# Patient Record
Sex: Female | Born: 1960 | Race: White | Hispanic: No | Marital: Single | Smoking: Current some day smoker
Health system: Southern US, Community
[De-identification: ages and names within clinical notes are randomized; demographics above are authoritative.]

## PROBLEM LIST (undated history)

## (undated) DIAGNOSIS — Z9889 Other specified postprocedural states: Secondary | ICD-10-CM

## (undated) DIAGNOSIS — I509 Heart failure, unspecified: Secondary | ICD-10-CM

## (undated) DIAGNOSIS — R609 Edema, unspecified: Secondary | ICD-10-CM

## (undated) DIAGNOSIS — L0292 Furuncle, unspecified: Secondary | ICD-10-CM

## (undated) DIAGNOSIS — J449 Chronic obstructive pulmonary disease, unspecified: Secondary | ICD-10-CM

## (undated) DIAGNOSIS — R0600 Dyspnea, unspecified: Secondary | ICD-10-CM

## (undated) DIAGNOSIS — G629 Polyneuropathy, unspecified: Secondary | ICD-10-CM

## (undated) DIAGNOSIS — D179 Benign lipomatous neoplasm, unspecified: Secondary | ICD-10-CM

## (undated) DIAGNOSIS — I639 Cerebral infarction, unspecified: Secondary | ICD-10-CM

## (undated) DIAGNOSIS — L0293 Carbuncle, unspecified: Secondary | ICD-10-CM

## (undated) DIAGNOSIS — K219 Gastro-esophageal reflux disease without esophagitis: Secondary | ICD-10-CM

## (undated) DIAGNOSIS — N390 Urinary tract infection, site not specified: Secondary | ICD-10-CM

## (undated) DIAGNOSIS — M5136 Other intervertebral disc degeneration, lumbar region: Secondary | ICD-10-CM

## (undated) DIAGNOSIS — F259 Schizoaffective disorder, unspecified: Secondary | ICD-10-CM

## (undated) DIAGNOSIS — N319 Neuromuscular dysfunction of bladder, unspecified: Secondary | ICD-10-CM

## (undated) DIAGNOSIS — Z9851 Tubal ligation status: Secondary | ICD-10-CM

## (undated) DIAGNOSIS — J45909 Unspecified asthma, uncomplicated: Secondary | ICD-10-CM

## (undated) DIAGNOSIS — G4730 Sleep apnea, unspecified: Secondary | ICD-10-CM

## (undated) DIAGNOSIS — Z9049 Acquired absence of other specified parts of digestive tract: Secondary | ICD-10-CM

## (undated) DIAGNOSIS — G5600 Carpal tunnel syndrome, unspecified upper limb: Secondary | ICD-10-CM

## (undated) DIAGNOSIS — K469 Unspecified abdominal hernia without obstruction or gangrene: Secondary | ICD-10-CM

## (undated) DIAGNOSIS — Z972 Presence of dental prosthetic device (complete) (partial): Secondary | ICD-10-CM

## (undated) DIAGNOSIS — N760 Acute vaginitis: Secondary | ICD-10-CM

## (undated) DIAGNOSIS — E669 Obesity, unspecified: Secondary | ICD-10-CM

## (undated) DIAGNOSIS — R252 Cramp and spasm: Secondary | ICD-10-CM

## (undated) DIAGNOSIS — M1990 Unspecified osteoarthritis, unspecified site: Secondary | ICD-10-CM

## (undated) DIAGNOSIS — I10 Essential (primary) hypertension: Secondary | ICD-10-CM

## (undated) DIAGNOSIS — E785 Hyperlipidemia, unspecified: Secondary | ICD-10-CM

## (undated) DIAGNOSIS — E119 Type 2 diabetes mellitus without complications: Secondary | ICD-10-CM

## (undated) DIAGNOSIS — M51369 Other intervertebral disc degeneration, lumbar region without mention of lumbar back pain or lower extremity pain: Secondary | ICD-10-CM

## (undated) HISTORY — DX: Other specified postprocedural states: Z98.890

## (undated) HISTORY — PX: EYE SURGERY: SHX253

## (undated) HISTORY — DX: Other intervertebral disc degeneration, lumbar region: M51.36

## (undated) HISTORY — DX: Chronic obstructive pulmonary disease, unspecified: J44.9

## (undated) HISTORY — DX: Polyneuropathy, unspecified: G62.9

## (undated) HISTORY — PX: CHOLECYSTECTOMY: SHX55

## (undated) HISTORY — DX: Unspecified osteoarthritis, unspecified site: M19.90

## (undated) HISTORY — DX: Carpal tunnel syndrome, unspecified upper limb: G56.00

## (undated) HISTORY — DX: Carbuncle, unspecified: L02.93

## (undated) HISTORY — DX: Obesity, unspecified: E66.9

## (undated) HISTORY — DX: Acute vaginitis: N76.0

## (undated) HISTORY — DX: Dyspnea, unspecified: R06.00

## (undated) HISTORY — DX: Cramp and spasm: R25.2

## (undated) HISTORY — PX: OTHER SURGICAL HISTORY: SHX169

## (undated) HISTORY — DX: Hyperlipidemia, unspecified: E78.5

## (undated) HISTORY — DX: Edema, unspecified: R60.9

## (undated) HISTORY — DX: Schizoaffective disorder, unspecified: F25.9

## (undated) HISTORY — DX: Urinary tract infection, site not specified: N39.0

## (undated) HISTORY — DX: Gastro-esophageal reflux disease without esophagitis: K21.9

## (undated) HISTORY — DX: Other intervertebral disc degeneration, lumbar region without mention of lumbar back pain or lower extremity pain: M51.369

## (undated) HISTORY — DX: Neuromuscular dysfunction of bladder, unspecified: N31.9

## (undated) HISTORY — DX: Furuncle, unspecified: L02.92

---

## 2004-03-04 ENCOUNTER — Emergency Department: Payer: Self-pay | Admitting: Emergency Medicine

## 2004-04-10 ENCOUNTER — Inpatient Hospital Stay: Payer: Self-pay | Admitting: Psychiatry

## 2004-04-30 ENCOUNTER — Encounter: Payer: Self-pay | Admitting: Family Medicine

## 2004-05-17 ENCOUNTER — Encounter: Payer: Self-pay | Admitting: Family Medicine

## 2004-08-12 ENCOUNTER — Other Ambulatory Visit: Payer: Self-pay

## 2004-08-12 ENCOUNTER — Observation Stay: Payer: Self-pay | Admitting: Internal Medicine

## 2005-04-06 ENCOUNTER — Other Ambulatory Visit: Payer: Self-pay

## 2005-04-07 ENCOUNTER — Observation Stay: Payer: Self-pay | Admitting: Internal Medicine

## 2005-05-13 ENCOUNTER — Ambulatory Visit: Payer: Self-pay | Admitting: Family Medicine

## 2005-07-28 ENCOUNTER — Emergency Department: Payer: Self-pay | Admitting: Emergency Medicine

## 2005-07-28 ENCOUNTER — Other Ambulatory Visit: Payer: Self-pay

## 2005-11-13 ENCOUNTER — Ambulatory Visit: Payer: Self-pay | Admitting: Gastroenterology

## 2006-03-23 ENCOUNTER — Emergency Department: Payer: Self-pay | Admitting: Emergency Medicine

## 2006-06-23 ENCOUNTER — Inpatient Hospital Stay: Payer: Self-pay | Admitting: Internal Medicine

## 2006-06-23 ENCOUNTER — Other Ambulatory Visit: Payer: Self-pay

## 2006-09-01 ENCOUNTER — Emergency Department: Payer: Self-pay | Admitting: Emergency Medicine

## 2006-09-01 ENCOUNTER — Other Ambulatory Visit: Payer: Self-pay

## 2006-09-03 ENCOUNTER — Ambulatory Visit: Payer: Self-pay | Admitting: Family Medicine

## 2006-09-16 ENCOUNTER — Encounter: Payer: Self-pay | Admitting: Family Medicine

## 2006-11-05 ENCOUNTER — Other Ambulatory Visit: Payer: Self-pay

## 2006-11-05 ENCOUNTER — Emergency Department: Payer: Self-pay | Admitting: Emergency Medicine

## 2006-11-07 ENCOUNTER — Other Ambulatory Visit: Payer: Self-pay

## 2006-11-07 ENCOUNTER — Inpatient Hospital Stay: Payer: Self-pay | Admitting: Internal Medicine

## 2006-11-14 ENCOUNTER — Emergency Department: Payer: Self-pay | Admitting: Emergency Medicine

## 2006-11-24 ENCOUNTER — Ambulatory Visit: Payer: Self-pay | Admitting: Family Medicine

## 2007-02-21 ENCOUNTER — Ambulatory Visit: Payer: Self-pay | Admitting: Family Medicine

## 2007-05-23 DIAGNOSIS — E785 Hyperlipidemia, unspecified: Secondary | ICD-10-CM | POA: Diagnosis present

## 2007-05-23 DIAGNOSIS — M1990 Unspecified osteoarthritis, unspecified site: Secondary | ICD-10-CM | POA: Insufficient documentation

## 2007-06-28 ENCOUNTER — Encounter: Payer: Self-pay | Admitting: Family Medicine

## 2007-07-18 ENCOUNTER — Encounter: Payer: Self-pay | Admitting: Family Medicine

## 2007-08-06 ENCOUNTER — Other Ambulatory Visit: Payer: Self-pay

## 2007-08-06 ENCOUNTER — Emergency Department: Payer: Self-pay | Admitting: Emergency Medicine

## 2007-09-13 ENCOUNTER — Ambulatory Visit: Payer: Self-pay | Admitting: Gastroenterology

## 2007-12-15 ENCOUNTER — Ambulatory Visit: Payer: Self-pay | Admitting: Family Medicine

## 2008-01-03 ENCOUNTER — Inpatient Hospital Stay: Payer: Self-pay | Admitting: Internal Medicine

## 2008-01-22 ENCOUNTER — Emergency Department: Payer: Self-pay | Admitting: Emergency Medicine

## 2008-03-27 ENCOUNTER — Emergency Department: Payer: Self-pay | Admitting: Internal Medicine

## 2008-03-30 ENCOUNTER — Emergency Department: Payer: Self-pay | Admitting: Emergency Medicine

## 2008-05-26 ENCOUNTER — Emergency Department: Payer: Self-pay | Admitting: Emergency Medicine

## 2008-06-22 ENCOUNTER — Ambulatory Visit: Payer: Self-pay | Admitting: Family Medicine

## 2008-07-17 ENCOUNTER — Ambulatory Visit: Payer: Self-pay | Admitting: Family Medicine

## 2008-08-18 ENCOUNTER — Emergency Department: Payer: Self-pay | Admitting: Emergency Medicine

## 2008-08-22 ENCOUNTER — Emergency Department: Payer: Self-pay | Admitting: Emergency Medicine

## 2008-10-16 ENCOUNTER — Emergency Department: Payer: Self-pay | Admitting: Emergency Medicine

## 2008-11-14 ENCOUNTER — Ambulatory Visit: Payer: Self-pay | Admitting: Family Medicine

## 2009-02-04 ENCOUNTER — Ambulatory Visit: Payer: Self-pay | Admitting: Internal Medicine

## 2009-03-20 DIAGNOSIS — G629 Polyneuropathy, unspecified: Secondary | ICD-10-CM

## 2009-04-16 ENCOUNTER — Inpatient Hospital Stay: Payer: Self-pay | Admitting: Specialist

## 2009-06-06 ENCOUNTER — Emergency Department: Payer: Self-pay | Admitting: Emergency Medicine

## 2009-08-01 ENCOUNTER — Emergency Department: Payer: Self-pay | Admitting: Emergency Medicine

## 2009-10-22 ENCOUNTER — Emergency Department: Payer: Self-pay | Admitting: Emergency Medicine

## 2009-11-01 ENCOUNTER — Ambulatory Visit: Payer: Self-pay

## 2009-11-05 ENCOUNTER — Encounter: Payer: Self-pay | Admitting: Family Medicine

## 2010-02-06 ENCOUNTER — Emergency Department: Payer: Self-pay | Admitting: Emergency Medicine

## 2010-03-21 ENCOUNTER — Emergency Department: Payer: Self-pay | Admitting: Emergency Medicine

## 2010-08-14 ENCOUNTER — Emergency Department: Payer: Self-pay | Admitting: Emergency Medicine

## 2011-06-18 ENCOUNTER — Ambulatory Visit: Payer: Self-pay | Admitting: Family Medicine

## 2011-08-11 ENCOUNTER — Emergency Department: Payer: Self-pay | Admitting: Emergency Medicine

## 2011-08-11 LAB — URINALYSIS, COMPLETE
Bacteria: NONE SEEN
Bilirubin,UR: NEGATIVE
Blood: NEGATIVE
Glucose,UR: NEGATIVE mg/dL (ref 0–75)
Ketone: NEGATIVE
Leukocyte Esterase: NEGATIVE
Nitrite: NEGATIVE
Ph: 6 (ref 4.5–8.0)
Protein: NEGATIVE
RBC,UR: NONE SEEN /HPF (ref 0–5)
Specific Gravity: 1.002 (ref 1.003–1.030)
Squamous Epithelial: 1
WBC UR: 1 /HPF (ref 0–5)

## 2012-08-22 ENCOUNTER — Ambulatory Visit: Payer: Self-pay | Admitting: Family Medicine

## 2012-09-16 LAB — DRUG SCREEN, URINE

## 2012-09-16 LAB — CBC
HCT: 35.9 % (ref 35.0–47.0)
HGB: 12.2 g/dL (ref 12.0–16.0)
MCH: 25.6 pg — ABNORMAL LOW (ref 26.0–34.0)
MCHC: 34 g/dL (ref 32.0–36.0)
MCV: 75 fL — ABNORMAL LOW (ref 80–100)
Platelet: 231 10*3/uL (ref 150–440)
RBC: 4.77 10*6/uL (ref 3.80–5.20)
RDW: 16.6 % — ABNORMAL HIGH (ref 11.5–14.5)
WBC: 9.3 10*3/uL (ref 3.6–11.0)

## 2012-09-16 LAB — ETHANOL
Ethanol %: <0.003 % (ref 0.000–0.080)
Ethanol: <3 mg/dL

## 2012-09-16 LAB — COMPREHENSIVE METABOLIC PANEL
Albumin: 3.3 g/dL — ABNORMAL LOW (ref 3.4–5.0)
Alkaline Phosphatase: 205 U/L — ABNORMAL HIGH (ref 50–136)
Anion Gap: 6 — ABNORMAL LOW (ref 7–16)
BUN: 12 mg/dL (ref 7–18)
Bilirubin,Total: 0.3 mg/dL (ref 0.2–1.0)
Calcium, Total: 9.8 mg/dL (ref 8.5–10.1)
Chloride: 100 mmol/L (ref 98–107)
Co2: 31 mmol/L (ref 21–32)
Creatinine: 0.72 mg/dL (ref 0.60–1.30)
EGFR (African American): >60
EGFR (Non-African Amer.): >60
Glucose: 169 mg/dL — ABNORMAL HIGH (ref 65–99)
Osmolality: 277 (ref 275–301)
Potassium: 3.7 mmol/L (ref 3.5–5.1)
SGOT(AST): 16 U/L (ref 15–37)
SGPT (ALT): 28 U/L (ref 12–78)
Sodium: 137 mmol/L (ref 136–145)
Total Protein: 7.2 g/dL (ref 6.4–8.2)

## 2012-09-16 LAB — URINALYSIS, COMPLETE
Bilirubin,UR: NEGATIVE
Glucose,UR: NEGATIVE mg/dL (ref 0–75)
Ketone: NEGATIVE
Nitrite: POSITIVE
Ph: 6 (ref 4.5–8.0)
Protein: NEGATIVE
RBC,UR: 1 /HPF (ref 0–5)
Specific Gravity: 1.002 (ref 1.003–1.030)
Squamous Epithelial: <1
WBC UR: 25 /HPF (ref 0–5)

## 2012-09-16 LAB — TSH: Thyroid Stimulating Horm: 7.36 u[IU]/mL — ABNORMAL HIGH

## 2012-09-17 ENCOUNTER — Inpatient Hospital Stay: Payer: Self-pay | Admitting: Internal Medicine

## 2012-09-17 LAB — T4, FREE: Free Thyroxine: 0.94 ng/dL (ref 0.76–1.46)

## 2012-09-18 LAB — TROPONIN I: Troponin-I: <0.02 ng/mL

## 2012-09-18 LAB — GENTAMICIN LEVEL, PEAK: Gentamicin, Peak: 2.4 ug/mL — ABNORMAL LOW (ref 4.0–8.0)

## 2012-09-18 LAB — URINE CULTURE

## 2012-09-18 LAB — GENTAMICIN LEVEL, TROUGH: Gentamicin, Trough: <0.2 ug/mL — ABNORMAL LOW (ref 0.0–2.0)

## 2012-09-19 LAB — BASIC METABOLIC PANEL
Anion Gap: 4 — ABNORMAL LOW (ref 7–16)
BUN: 16 mg/dL (ref 7–18)
Calcium, Total: 9.4 mg/dL (ref 8.5–10.1)
Chloride: 100 mmol/L (ref 98–107)
Co2: 33 mmol/L — ABNORMAL HIGH (ref 21–32)
Creatinine: 0.88 mg/dL (ref 0.60–1.30)
EGFR (African American): >60
EGFR (Non-African Amer.): >60
Glucose: 167 mg/dL — ABNORMAL HIGH (ref 65–99)
Osmolality: 279 (ref 275–301)
Potassium: 4.3 mmol/L (ref 3.5–5.1)
Sodium: 137 mmol/L (ref 136–145)

## 2012-09-19 LAB — CBC WITH DIFFERENTIAL/PLATELET
Basophil #: 0.1 10*3/uL (ref 0.0–0.1)
Basophil %: 1.3 %
Eosinophil #: 0.3 10*3/uL (ref 0.0–0.7)
Eosinophil %: 2.5 %
HCT: 38.2 % (ref 35.0–47.0)
HGB: 12.7 g/dL (ref 12.0–16.0)
Lymphocyte #: 2.4 10*3/uL (ref 1.0–3.6)
Lymphocyte %: 22.7 %
MCH: 25.4 pg — ABNORMAL LOW (ref 26.0–34.0)
MCHC: 33.3 g/dL (ref 32.0–36.0)
MCV: 76 fL — ABNORMAL LOW (ref 80–100)
Monocyte #: 0.8 x10 3/mm (ref 0.2–0.9)
Monocyte %: 7.5 %
Neutrophil #: 6.9 10*3/uL — ABNORMAL HIGH (ref 1.4–6.5)
Neutrophil %: 66 %
Platelet: 270 10*3/uL (ref 150–440)
RBC: 5.01 10*6/uL (ref 3.80–5.20)
RDW: 16.6 % — ABNORMAL HIGH (ref 11.5–14.5)
WBC: 10.5 10*3/uL (ref 3.6–11.0)

## 2012-10-05 ENCOUNTER — Emergency Department: Payer: Self-pay | Admitting: Emergency Medicine

## 2012-10-05 LAB — URINALYSIS, COMPLETE
Bacteria: NONE SEEN
Bilirubin,UR: NEGATIVE
Glucose,UR: NEGATIVE mg/dL (ref 0–75)
Ketone: NEGATIVE
Leukocyte Esterase: NEGATIVE
Nitrite: NEGATIVE
Ph: 5 (ref 4.5–8.0)
Protein: NEGATIVE
RBC,UR: 1 /HPF (ref 0–5)
Specific Gravity: 1.005 (ref 1.003–1.030)
Squamous Epithelial: 2
WBC UR: 1 /HPF (ref 0–5)

## 2012-10-05 LAB — COMPREHENSIVE METABOLIC PANEL
Albumin: 3.3 g/dL — ABNORMAL LOW (ref 3.4–5.0)
Alkaline Phosphatase: 209 U/L — ABNORMAL HIGH (ref 50–136)
Anion Gap: 6 — ABNORMAL LOW (ref 7–16)
BUN: 16 mg/dL (ref 7–18)
Bilirubin,Total: 0.3 mg/dL (ref 0.2–1.0)
Calcium, Total: 9.2 mg/dL (ref 8.5–10.1)
Chloride: 99 mmol/L (ref 98–107)
Co2: 29 mmol/L (ref 21–32)
Creatinine: 0.86 mg/dL (ref 0.60–1.30)
EGFR (African American): >60
EGFR (Non-African Amer.): >60
Glucose: 211 mg/dL — ABNORMAL HIGH (ref 65–99)
Osmolality: 276 (ref 275–301)
Potassium: 3.8 mmol/L (ref 3.5–5.1)
SGOT(AST): 23 U/L (ref 15–37)
SGPT (ALT): 33 U/L (ref 12–78)
Sodium: 134 mmol/L — ABNORMAL LOW (ref 136–145)
Total Protein: 7.1 g/dL (ref 6.4–8.2)

## 2012-10-05 LAB — CBC
HCT: 38.2 % (ref 35.0–47.0)
HGB: 12.6 g/dL (ref 12.0–16.0)
MCH: 25.4 pg — ABNORMAL LOW (ref 26.0–34.0)
MCHC: 32.9 g/dL (ref 32.0–36.0)
MCV: 77 fL — ABNORMAL LOW (ref 80–100)
Platelet: 238 10*3/uL (ref 150–440)
RBC: 4.95 10*6/uL (ref 3.80–5.20)
RDW: 16.4 % — ABNORMAL HIGH (ref 11.5–14.5)
WBC: 14.8 10*3/uL — ABNORMAL HIGH (ref 3.6–11.0)

## 2012-10-05 LAB — LIPASE, BLOOD: Lipase: 127 U/L (ref 73–393)

## 2012-10-05 LAB — TROPONIN I: Troponin-I: <0.02 ng/mL

## 2012-10-06 LAB — URINE CULTURE

## 2013-02-23 ENCOUNTER — Emergency Department: Payer: Self-pay | Admitting: Emergency Medicine

## 2013-02-23 LAB — CBC WITH DIFFERENTIAL/PLATELET
Basophil #: 0.1 10*3/uL (ref 0.0–0.1)
Basophil %: 0.9 %
Eosinophil #: 0.1 10*3/uL (ref 0.0–0.7)
Eosinophil %: 0.8 %
HCT: 38.6 % (ref 35.0–47.0)
HGB: 12.7 g/dL (ref 12.0–16.0)
Lymphocyte #: 1.3 10*3/uL (ref 1.0–3.6)
Lymphocyte %: 9.8 %
MCH: 25 pg — ABNORMAL LOW (ref 26.0–34.0)
MCHC: 33 g/dL (ref 32.0–36.0)
MCV: 76 fL — ABNORMAL LOW (ref 80–100)
Monocyte #: 0.7 x10 3/mm (ref 0.2–0.9)
Monocyte %: 5 %
Neutrophil #: 11.3 10*3/uL — ABNORMAL HIGH (ref 1.4–6.5)
Neutrophil %: 83.5 %
Platelet: 216 10*3/uL (ref 150–440)
RBC: 5.09 10*6/uL (ref 3.80–5.20)
RDW: 17 % — ABNORMAL HIGH (ref 11.5–14.5)
WBC: 13.5 10*3/uL — ABNORMAL HIGH (ref 3.6–11.0)

## 2013-02-23 LAB — COMPREHENSIVE METABOLIC PANEL
Albumin: 3.8 g/dL (ref 3.4–5.0)
Alkaline Phosphatase: 179 U/L — ABNORMAL HIGH
Anion Gap: 2 — ABNORMAL LOW (ref 7–16)
BUN: 13 mg/dL (ref 7–18)
Bilirubin,Total: 0.4 mg/dL (ref 0.2–1.0)
Calcium, Total: 9.9 mg/dL (ref 8.5–10.1)
Chloride: 97 mmol/L — ABNORMAL LOW (ref 98–107)
Co2: 35 mmol/L — ABNORMAL HIGH (ref 21–32)
Creatinine: 0.82 mg/dL (ref 0.60–1.30)
EGFR (African American): >60
EGFR (Non-African Amer.): >60
Glucose: 249 mg/dL — ABNORMAL HIGH (ref 65–99)
Osmolality: 277 (ref 275–301)
Potassium: 3.5 mmol/L (ref 3.5–5.1)
SGOT(AST): 22 U/L (ref 15–37)
SGPT (ALT): 33 U/L (ref 12–78)
Sodium: 134 mmol/L — ABNORMAL LOW (ref 136–145)
Total Protein: 7.5 g/dL (ref 6.4–8.2)

## 2013-02-23 LAB — URINALYSIS, COMPLETE
Bacteria: NONE SEEN
Bilirubin,UR: NEGATIVE
Blood: NEGATIVE
Glucose,UR: NEGATIVE mg/dL (ref 0–75)
Ketone: NEGATIVE
Leukocyte Esterase: NEGATIVE
Nitrite: NEGATIVE
Ph: 6 (ref 4.5–8.0)
Protein: NEGATIVE
RBC,UR: NONE SEEN /HPF (ref 0–5)
Specific Gravity: 1.003 (ref 1.003–1.030)
Squamous Epithelial: NONE SEEN
WBC UR: NONE SEEN /HPF (ref 0–5)

## 2013-02-23 LAB — TROPONIN I: Troponin-I: <0.02 ng/mL

## 2013-03-02 ENCOUNTER — Ambulatory Visit: Payer: Self-pay | Admitting: Family Medicine

## 2013-08-08 LAB — CBC WITH DIFFERENTIAL/PLATELET
Basophil #: 0.1 10*3/uL (ref 0.0–0.1)
Basophil %: 0.6 %
Eosinophil #: 0.1 10*3/uL (ref 0.0–0.7)
Eosinophil %: 1.4 %
HCT: 38.5 % (ref 35.0–47.0)
HGB: 12.1 g/dL (ref 12.0–16.0)
Lymphocyte #: 1.5 10*3/uL (ref 1.0–3.6)
Lymphocyte %: 15 %
MCH: 24.6 pg — ABNORMAL LOW (ref 26.0–34.0)
MCHC: 31.4 g/dL — ABNORMAL LOW (ref 32.0–36.0)
MCV: 78 fL — ABNORMAL LOW (ref 80–100)
Monocyte #: 0.8 x10 3/mm (ref 0.2–0.9)
Monocyte %: 8.1 %
Neutrophil #: 7.6 10*3/uL — ABNORMAL HIGH (ref 1.4–6.5)
Neutrophil %: 74.9 %
Platelet: 253 10*3/uL (ref 150–440)
RBC: 4.92 10*6/uL (ref 3.80–5.20)
RDW: 17.3 % — ABNORMAL HIGH (ref 11.5–14.5)
WBC: 10.1 10*3/uL (ref 3.6–11.0)

## 2013-08-08 LAB — URINALYSIS, COMPLETE
Bilirubin,UR: NEGATIVE
Blood: NEGATIVE
Glucose,UR: NEGATIVE mg/dL (ref 0–75)
Ketone: NEGATIVE
Leukocyte Esterase: NEGATIVE
Nitrite: POSITIVE
Ph: 6 (ref 4.5–8.0)
Protein: NEGATIVE
RBC,UR: <1 /HPF (ref 0–5)
Specific Gravity: 1.003 (ref 1.003–1.030)
Squamous Epithelial: 1
WBC UR: 1 /HPF (ref 0–5)

## 2013-08-08 LAB — COMPREHENSIVE METABOLIC PANEL
Albumin: 3.6 g/dL (ref 3.4–5.0)
Alkaline Phosphatase: 168 U/L — ABNORMAL HIGH
Anion Gap: 6 — ABNORMAL LOW (ref 7–16)
BUN: 13 mg/dL (ref 7–18)
Bilirubin,Total: 0.4 mg/dL (ref 0.2–1.0)
Calcium, Total: 8.9 mg/dL (ref 8.5–10.1)
Chloride: 97 mmol/L — ABNORMAL LOW (ref 98–107)
Co2: 32 mmol/L (ref 21–32)
Creatinine: 0.56 mg/dL — ABNORMAL LOW (ref 0.60–1.30)
EGFR (African American): >60
EGFR (Non-African Amer.): >60
Glucose: 204 mg/dL — ABNORMAL HIGH (ref 65–99)
Osmolality: 276 (ref 275–301)
Potassium: 3.9 mmol/L (ref 3.5–5.1)
SGOT(AST): 21 U/L (ref 15–37)
SGPT (ALT): 41 U/L (ref 12–78)
Sodium: 135 mmol/L — ABNORMAL LOW (ref 136–145)
Total Protein: 7 g/dL (ref 6.4–8.2)

## 2013-08-09 ENCOUNTER — Inpatient Hospital Stay: Payer: Self-pay | Admitting: Internal Medicine

## 2013-08-09 LAB — CBC WITH DIFFERENTIAL/PLATELET
Basophil #: 0.1 10*3/uL (ref 0.0–0.1)
Basophil %: 1.1 %
Eosinophil #: 0.1 10*3/uL (ref 0.0–0.7)
Eosinophil %: 1.9 %
HCT: 35.6 % (ref 35.0–47.0)
HGB: 11.6 g/dL — ABNORMAL LOW (ref 12.0–16.0)
Lymphocyte #: 1.3 10*3/uL (ref 1.0–3.6)
Lymphocyte %: 17.6 %
MCH: 25.6 pg — ABNORMAL LOW (ref 26.0–34.0)
MCHC: 32.5 g/dL (ref 32.0–36.0)
MCV: 79 fL — ABNORMAL LOW (ref 80–100)
Monocyte #: 0.7 x10 3/mm (ref 0.2–0.9)
Monocyte %: 9.2 %
Neutrophil #: 5.1 10*3/uL (ref 1.4–6.5)
Neutrophil %: 70.2 %
Platelet: 234 10*3/uL (ref 150–440)
RBC: 4.53 10*6/uL (ref 3.80–5.20)
RDW: 17.3 % — ABNORMAL HIGH (ref 11.5–14.5)
WBC: 7.3 10*3/uL (ref 3.6–11.0)

## 2013-08-09 LAB — BASIC METABOLIC PANEL
Anion Gap: 8 (ref 7–16)
BUN: 10 mg/dL (ref 7–18)
Calcium, Total: 9 mg/dL (ref 8.5–10.1)
Chloride: 101 mmol/L (ref 98–107)
Co2: 29 mmol/L (ref 21–32)
Creatinine: 0.54 mg/dL — ABNORMAL LOW (ref 0.60–1.30)
EGFR (African American): >60
EGFR (Non-African Amer.): >60
Glucose: 193 mg/dL — ABNORMAL HIGH (ref 65–99)
Osmolality: 280 (ref 275–301)
Potassium: 4.1 mmol/L (ref 3.5–5.1)
Sodium: 138 mmol/L (ref 136–145)

## 2013-08-10 LAB — URINE CULTURE

## 2013-10-15 ENCOUNTER — Emergency Department: Payer: Self-pay | Admitting: Emergency Medicine

## 2013-10-15 LAB — URINALYSIS, COMPLETE
Bacteria: NONE SEEN
Bilirubin,UR: NEGATIVE
Blood: NEGATIVE
Glucose,UR: NEGATIVE mg/dL (ref 0–75)
Ketone: NEGATIVE
Leukocyte Esterase: NEGATIVE
Nitrite: NEGATIVE
Ph: 5 (ref 4.5–8.0)
Protein: NEGATIVE
RBC,UR: NONE SEEN /HPF (ref 0–5)
Specific Gravity: 1.003 (ref 1.003–1.030)
Squamous Epithelial: 2
WBC UR: <1 /HPF (ref 0–5)

## 2013-10-15 LAB — CBC WITH DIFFERENTIAL/PLATELET
Basophil #: 0.1 10*3/uL (ref 0.0–0.1)
Basophil %: 0.8 %
Eosinophil #: 0.2 10*3/uL (ref 0.0–0.7)
Eosinophil %: 2.3 %
HCT: 38 % (ref 35.0–47.0)
HGB: 12.2 g/dL (ref 12.0–16.0)
Lymphocyte #: 1 10*3/uL (ref 1.0–3.6)
Lymphocyte %: 13.4 %
MCH: 26 pg (ref 26.0–34.0)
MCHC: 32.2 g/dL (ref 32.0–36.0)
MCV: 81 fL (ref 80–100)
Monocyte #: 0.6 x10 3/mm (ref 0.2–0.9)
Monocyte %: 8.1 %
Neutrophil #: 5.6 10*3/uL (ref 1.4–6.5)
Neutrophil %: 75.4 %
Platelet: 200 10*3/uL (ref 150–440)
RBC: 4.7 10*6/uL (ref 3.80–5.20)
RDW: 16.9 % — ABNORMAL HIGH (ref 11.5–14.5)
WBC: 7.4 10*3/uL (ref 3.6–11.0)

## 2013-10-15 LAB — BASIC METABOLIC PANEL
Anion Gap: 10 (ref 7–16)
BUN: 11 mg/dL (ref 7–18)
Calcium, Total: 9.6 mg/dL (ref 8.5–10.1)
Chloride: 100 mmol/L (ref 98–107)
Co2: 26 mmol/L (ref 21–32)
Creatinine: 0.97 mg/dL (ref 0.60–1.30)
EGFR (African American): >60
EGFR (Non-African Amer.): >60
Glucose: 251 mg/dL — ABNORMAL HIGH (ref 65–99)
Osmolality: 280 (ref 275–301)
Potassium: 4.4 mmol/L (ref 3.5–5.1)
Sodium: 136 mmol/L (ref 136–145)

## 2013-10-15 LAB — PRO B NATRIURETIC PEPTIDE: B-Type Natriuretic Peptide: 61 pg/mL (ref 0–125)

## 2013-10-15 LAB — TROPONIN I: Troponin-I: <0.02 ng/mL

## 2013-12-18 ENCOUNTER — Ambulatory Visit: Payer: Self-pay | Admitting: Family Medicine

## 2014-01-17 ENCOUNTER — Inpatient Hospital Stay: Payer: Self-pay | Admitting: Internal Medicine

## 2014-01-17 LAB — CK: CK, Total: 153 U/L (ref 26–192)

## 2014-01-17 LAB — CBC
HCT: 39.5 % (ref 35.0–47.0)
HGB: 12.8 g/dL (ref 12.0–16.0)
MCH: 26.2 pg (ref 26.0–34.0)
MCHC: 32.5 g/dL (ref 32.0–36.0)
MCV: 81 fL (ref 80–100)
Platelet: 229 10*3/uL (ref 150–440)
RBC: 4.89 10*6/uL (ref 3.80–5.20)
RDW: 16.2 % — ABNORMAL HIGH (ref 11.5–14.5)
WBC: 10.9 10*3/uL (ref 3.6–11.0)

## 2014-01-17 LAB — BASIC METABOLIC PANEL
Anion Gap: 12 (ref 7–16)
BUN: 19 mg/dL — ABNORMAL HIGH (ref 7–18)
Calcium, Total: 8.8 mg/dL (ref 8.5–10.1)
Chloride: 80 mmol/L — ABNORMAL LOW (ref 98–107)
Co2: 31 mmol/L (ref 21–32)
Creatinine: 1.08 mg/dL (ref 0.60–1.30)
EGFR (African American): >60
EGFR (Non-African Amer.): 56 — ABNORMAL LOW
Glucose: 161 mg/dL — ABNORMAL HIGH (ref 65–99)
Osmolality: 254 (ref 275–301)
Potassium: 3.3 mmol/L — ABNORMAL LOW (ref 3.5–5.1)
Sodium: 123 mmol/L — ABNORMAL LOW (ref 136–145)

## 2014-01-17 LAB — URINALYSIS, COMPLETE
Bilirubin,UR: NEGATIVE
Blood: NEGATIVE
Glucose,UR: NEGATIVE mg/dL (ref 0–75)
Ketone: NEGATIVE
Leukocyte Esterase: NEGATIVE
Nitrite: NEGATIVE
Ph: 6 (ref 4.5–8.0)
Protein: NEGATIVE
RBC,UR: NONE SEEN /HPF (ref 0–5)
Specific Gravity: 1.004 (ref 1.003–1.030)
Squamous Epithelial: 1
WBC UR: 2 /HPF (ref 0–5)

## 2014-01-17 LAB — OSMOLALITY: Osmolality: 263 mOsm/kg — ABNORMAL LOW (ref 275–295)

## 2014-01-17 LAB — OSMOLALITY, URINE: Osmolality: 184 mOsm/kg

## 2014-01-17 LAB — TROPONIN I: Troponin-I: <0.02 ng/mL

## 2014-01-18 LAB — CBC WITH DIFFERENTIAL/PLATELET
Basophil #: 0.1 10*3/uL (ref 0.0–0.1)
Basophil %: 0.6 %
Eosinophil #: 0.2 10*3/uL (ref 0.0–0.7)
Eosinophil %: 1.9 %
HCT: 39.1 % (ref 35.0–47.0)
HGB: 12.9 g/dL (ref 12.0–16.0)
Lymphocyte #: 1 10*3/uL (ref 1.0–3.6)
Lymphocyte %: 10.3 %
MCH: 26.7 pg (ref 26.0–34.0)
MCHC: 32.9 g/dL (ref 32.0–36.0)
MCV: 81 fL (ref 80–100)
Monocyte #: 0.8 x10 3/mm (ref 0.2–0.9)
Monocyte %: 8.2 %
Neutrophil #: 7.7 10*3/uL — ABNORMAL HIGH (ref 1.4–6.5)
Neutrophil %: 79 %
Platelet: 237 10*3/uL (ref 150–440)
RBC: 4.81 10*6/uL (ref 3.80–5.20)
RDW: 16.1 % — ABNORMAL HIGH (ref 11.5–14.5)
WBC: 9.7 10*3/uL (ref 3.6–11.0)

## 2014-01-18 LAB — BASIC METABOLIC PANEL
Anion Gap: 10 (ref 7–16)
BUN: 20 mg/dL — ABNORMAL HIGH (ref 7–18)
Calcium, Total: 8.9 mg/dL (ref 8.5–10.1)
Chloride: 84 mmol/L — ABNORMAL LOW (ref 98–107)
Co2: 34 mmol/L — ABNORMAL HIGH (ref 21–32)
Creatinine: 0.82 mg/dL (ref 0.60–1.30)
EGFR (African American): >60
EGFR (Non-African Amer.): >60
Glucose: 203 mg/dL — ABNORMAL HIGH (ref 65–99)
Osmolality: 266 (ref 275–301)
Potassium: 3.2 mmol/L — ABNORMAL LOW (ref 3.5–5.1)
Sodium: 128 mmol/L — ABNORMAL LOW (ref 136–145)

## 2014-01-18 LAB — HEMOGLOBIN A1C: Hemoglobin A1C: 8.7 % — ABNORMAL HIGH (ref 4.2–6.3)

## 2014-01-18 LAB — MAGNESIUM: Magnesium: 2 mg/dL

## 2014-01-19 LAB — COMPREHENSIVE METABOLIC PANEL
Albumin: 3 g/dL — ABNORMAL LOW (ref 3.4–5.0)
Alkaline Phosphatase: 154 U/L — ABNORMAL HIGH
Anion Gap: 8 (ref 7–16)
BUN: 15 mg/dL (ref 7–18)
Bilirubin,Total: 0.3 mg/dL (ref 0.2–1.0)
Calcium, Total: 8.8 mg/dL (ref 8.5–10.1)
Chloride: 99 mmol/L (ref 98–107)
Co2: 30 mmol/L (ref 21–32)
Creatinine: 0.69 mg/dL (ref 0.60–1.30)
EGFR (African American): >60
EGFR (Non-African Amer.): >60
Glucose: 203 mg/dL — ABNORMAL HIGH (ref 65–99)
Osmolality: 280 (ref 275–301)
Potassium: 4.1 mmol/L (ref 3.5–5.1)
SGOT(AST): 28 U/L (ref 15–37)
SGPT (ALT): 35 U/L
Sodium: 137 mmol/L (ref 136–145)
Total Protein: 6.6 g/dL (ref 6.4–8.2)

## 2014-02-16 LAB — URINALYSIS, COMPLETE
Bilirubin,UR: NEGATIVE
Glucose,UR: NEGATIVE mg/dL (ref 0–75)
Nitrite: POSITIVE
Ph: 6 (ref 4.5–8.0)
Protein: 100
RBC,UR: 17 /HPF (ref 0–5)
Specific Gravity: 1.012 (ref 1.003–1.030)
Squamous Epithelial: 1
WBC UR: 1115 /HPF (ref 0–5)

## 2014-02-16 LAB — COMPREHENSIVE METABOLIC PANEL
Albumin: 2.8 g/dL — ABNORMAL LOW (ref 3.4–5.0)
Alkaline Phosphatase: 178 U/L — ABNORMAL HIGH
Anion Gap: 11 (ref 7–16)
BUN: 10 mg/dL (ref 7–18)
Bilirubin,Total: 0.6 mg/dL (ref 0.2–1.0)
Calcium, Total: 8.5 mg/dL (ref 8.5–10.1)
Chloride: 98 mmol/L (ref 98–107)
Co2: 28 mmol/L (ref 21–32)
Creatinine: 0.88 mg/dL (ref 0.60–1.30)
EGFR (African American): >60
EGFR (Non-African Amer.): >60
Glucose: 235 mg/dL — ABNORMAL HIGH (ref 65–99)
Osmolality: 280 (ref 275–301)
Potassium: 3.4 mmol/L — ABNORMAL LOW (ref 3.5–5.1)
SGOT(AST): 20 U/L (ref 15–37)
SGPT (ALT): 30 U/L
Sodium: 137 mmol/L (ref 136–145)
Total Protein: 6.9 g/dL (ref 6.4–8.2)

## 2014-02-16 LAB — CBC WITH DIFFERENTIAL/PLATELET
Basophil #: 0.3 10*3/uL — ABNORMAL HIGH (ref 0.0–0.1)
Basophil %: 3.6 %
Eosinophil #: 0 10*3/uL (ref 0.0–0.7)
Eosinophil %: 0.1 %
HCT: 34.8 % — ABNORMAL LOW (ref 35.0–47.0)
HGB: 11.7 g/dL — ABNORMAL LOW (ref 12.0–16.0)
Lymphocyte #: 0.6 10*3/uL — ABNORMAL LOW (ref 1.0–3.6)
Lymphocyte %: 6.6 %
MCH: 27.5 pg (ref 26.0–34.0)
MCHC: 33.7 g/dL (ref 32.0–36.0)
MCV: 81 fL (ref 80–100)
Monocyte #: 0.8 x10 3/mm (ref 0.2–0.9)
Monocyte %: 9.9 %
Neutrophil #: 6.7 10*3/uL — ABNORMAL HIGH (ref 1.4–6.5)
Neutrophil %: 79.8 %
Platelet: 196 10*3/uL (ref 150–440)
RBC: 4.27 10*6/uL (ref 3.80–5.20)
RDW: 15.8 % — ABNORMAL HIGH (ref 11.5–14.5)
WBC: 8.4 10*3/uL (ref 3.6–11.0)

## 2014-02-16 LAB — PROTIME-INR
INR: 1.2
Prothrombin Time: 14.7 secs (ref 11.5–14.7)

## 2014-02-16 LAB — MAGNESIUM: Magnesium: 1.9 mg/dL

## 2014-02-16 LAB — TROPONIN I: Troponin-I: <0.02 ng/mL

## 2014-02-17 ENCOUNTER — Inpatient Hospital Stay: Payer: Self-pay | Admitting: Internal Medicine

## 2014-02-17 LAB — CBC WITH DIFFERENTIAL/PLATELET
Basophil #: 0 10*3/uL (ref 0.0–0.1)
Basophil %: 0.6 %
Eosinophil #: 0 10*3/uL (ref 0.0–0.7)
Eosinophil %: 0.2 %
HCT: 33 % — ABNORMAL LOW (ref 35.0–47.0)
HGB: 10.7 g/dL — ABNORMAL LOW (ref 12.0–16.0)
Lymphocyte #: 0.9 10*3/uL — ABNORMAL LOW (ref 1.0–3.6)
Lymphocyte %: 12 %
MCH: 26.9 pg (ref 26.0–34.0)
MCHC: 32.5 g/dL (ref 32.0–36.0)
MCV: 83 fL (ref 80–100)
Monocyte #: 0.7 x10 3/mm (ref 0.2–0.9)
Monocyte %: 10.2 %
Neutrophil #: 5.5 10*3/uL (ref 1.4–6.5)
Neutrophil %: 77 %
Platelet: 164 10*3/uL (ref 150–440)
RBC: 3.99 10*6/uL (ref 3.80–5.20)
RDW: 16 % — ABNORMAL HIGH (ref 11.5–14.5)
WBC: 7.1 10*3/uL (ref 3.6–11.0)

## 2014-02-17 LAB — BASIC METABOLIC PANEL
Anion Gap: 6 — ABNORMAL LOW (ref 7–16)
BUN: 9 mg/dL (ref 7–18)
Calcium, Total: 7.8 mg/dL — ABNORMAL LOW (ref 8.5–10.1)
Chloride: 104 mmol/L (ref 98–107)
Co2: 27 mmol/L (ref 21–32)
Creatinine: 0.85 mg/dL (ref 0.60–1.30)
EGFR (African American): >60
EGFR (Non-African Amer.): >60
Glucose: 237 mg/dL — ABNORMAL HIGH (ref 65–99)
Osmolality: 280 (ref 275–301)
Potassium: 3.7 mmol/L (ref 3.5–5.1)
Sodium: 137 mmol/L (ref 136–145)

## 2014-02-17 LAB — MAGNESIUM: Magnesium: 1.9 mg/dL

## 2014-02-18 LAB — CBC WITH DIFFERENTIAL/PLATELET
Basophil #: 0 10*3/uL (ref 0.0–0.1)
Basophil %: 0.5 %
Eosinophil #: 0 10*3/uL (ref 0.0–0.7)
Eosinophil %: 0.4 %
HCT: 32.2 % — ABNORMAL LOW (ref 35.0–47.0)
HGB: 10.4 g/dL — ABNORMAL LOW (ref 12.0–16.0)
Lymphocyte #: 1.1 10*3/uL (ref 1.0–3.6)
Lymphocyte %: 17.3 %
MCH: 26.5 pg (ref 26.0–34.0)
MCHC: 32.4 g/dL (ref 32.0–36.0)
MCV: 82 fL (ref 80–100)
Monocyte #: 0.8 x10 3/mm (ref 0.2–0.9)
Monocyte %: 12.8 %
Neutrophil #: 4.2 10*3/uL (ref 1.4–6.5)
Neutrophil %: 69 %
Platelet: 167 10*3/uL (ref 150–440)
RBC: 3.94 10*6/uL (ref 3.80–5.20)
RDW: 16.2 % — ABNORMAL HIGH (ref 11.5–14.5)
WBC: 6.1 10*3/uL (ref 3.6–11.0)

## 2014-02-18 LAB — URINE CULTURE

## 2014-02-18 LAB — HEMOGLOBIN A1C: Hemoglobin A1C: 8.4 % — ABNORMAL HIGH (ref 4.2–6.3)

## 2014-02-21 LAB — CULTURE, BLOOD (SINGLE)

## 2014-04-23 ENCOUNTER — Ambulatory Visit: Payer: Self-pay

## 2014-06-08 NOTE — Consult Note (Signed)
Details:   - Psychiatry:Follow up patient with schizoaffective disorder. Today she is feeling much better. No longer hearing voices or having any suicidal thoughts. Affect bright. Has positive feelings about her home and home life. Slept better. No med side effects  Patient tolerated starting back remeron 45mg  at night and extra 5mg  haldol. Advise contine meds. Can come safely off suicide precautions and dc the sitter. ACT team should continue follow up outside hospital. Home health aid should continue. Will follow in hospital.   Electronic Signatures: Gonzella Lex (MD)  (Signed 03-Aug-14 15:29)  Authored: Details   Last Updated: 03-Aug-14 15:29 by Gonzella Lex (MD)

## 2014-06-08 NOTE — Consult Note (Signed)
Brief Consult Note: Diagnosis: schizoaffective disorder.   Patient was seen by consultant.   Consult note dictated.   Recommend further assessment or treatment.   Orders entered.   Comments: Psychiatry: Patient seen and note dictated. Chart reviewed. This patient has a history of schizoaffective disorder and is reporting increased AH and SI but denies suicidal plan or intent. Current psych symptoms are similar to what we have seen before on admissions. Patient is a poor historian and has a hx of non-complience so full recent details lacking. Based on prior experience I'll restart the remeron 45mg  at night for depression and add 5mg  haldol qhs for psychosis. 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  Electronic Signatures: Gonzella Lex (MD)  (Signed 02-Aug-14 16:52)  Authored: Brief Consult Note   Last Updated: 02-Aug-14 16:52 by Gonzella Lex (MD)

## 2014-06-08 NOTE — Discharge Summary (Signed)
PATIENT NAME:  Cristina [name], Cristina [name] MR#:  [id number] DATE OF BIRTH:  October 04, 1960  DATE OF ADMISSION:  09/17/2012 DATE OF DISCHARGE:  09/19/2012  DISCHARGE DIAGNOSES: 1.  Multi-drug-resistant Escherichia coli urinary tract infection, to finish a total of 10 day course of IV Ativan through PICC line.  2.  Schizoaffective disorder, on Remeron  and Haldol started by psychiatry, doing well.   SECONDARY DIAGNOSIS:  1.  Diabetes mellitus.  2.  Chronic obstructive pulmonary disease.  3.  Obstructive sleep apnea.  4.  Hyperlipidemia.  5.  Peripheral neuropathy.  6.  Gastroesophageal reflux disease.  7.  Schizoaffective disorder.   CONSULTATIONS: Psychiatry, Dr. Weber Cooks.   PROCEDURES/RADIOLOGY: Chest x-ray on August 4, status post PICC line, showed no acute cardiopulmonary disease.   LABORATORY PANEL: Urinalysis on admission showed 2+  bacteria, 25 WBCs, 3+ leukocyte esterase, positive nitrite, WBC in clumps present.   Urine culture had no growth on August 2. Serum free T3 was within normal limits with a value of 2.6.   HISTORY AND SHORT HOSPITAL COURSE: The patient is a 54 year old female with the above-mentioned medical problems who was admitted for multi-drug-resistant Escherichia coli urinary tract infection, from her primary care physician's office. She was started on IV antibiotic based on culture and sensitivity. Please see Dr. Zacarias Pontes dictated history and physical for further details. The patient was changed to IV Ativan for simplicity of regimen and for more effective coverage. Consultation was obtained with psychiatry, Dr. Weber Cooks who recommended starting on Remeron and Haldol, which was helping her schizoaffective disorder. She was feeling much better, was undergone PICC line placement for long-term IV antibiotics and was discharged home in stable condition, on August 4. On the date of discharge, her vital signs are as follows: Temperature 98.5, heart rate 88 per minute, respirations 20 per  minute, blood pressure 127/85 and she was saturating 94% on room air.   PERTINENT PHYSICAL EXAMINATION ON THE DATE OF DISCHARGE:  CARDIOVASCULAR: S1, S2 normal. No murmurs, rubs, or gallop.  LUNGS: Clear to auscultation bilaterally. No wheezing, rales, rhonchi or crepitation.  ABDOMEN: Soft, benign.  NEUROLOGIC: Nonfocal examination.  All physical examination remained at baseline.   DISCHARGE MEDICATIONS: 1.  Metoprolol 50 mg p.o. b.i.d.  2.  Cyclobenzaprine 10 mg p.o. b.i.d.  3.  Meloxicam 7.5 mg p.o. b.i.d.  4.  Cetirizine 10 mg p.o. daily.  5.  Lasix 40 mg p.o. daily.  6.  Metformin 500 mg p.o. daily.  7.  Atorvastatin 20 mg p.o. at bedtime.  8.  Quinapril 20 mg p.o. daily.  9.  Glipizide XL 10 mg p.o. daily.  10.  Sucralfate1 gram p.o. 4 times a day.  11.  Omeprazole 40 mg p.o.  12.  Lyrica daily 100 mg p.o. 3 times a day.  13.  Mirtazapine 45 mg p.o. at bedtime.  14.  Haldol 5 mg p.o. at bedtime as needed for agitation.  15.  Colace 100 mg p.o. b.i.d. as needed.  16.  Ertapenem 1 gram IV daily for 8 more days through PICC line.   DISCHARGE DIET: ADA, low salt diet.   DISCHARGE ACTIVITY: As tolerated.   DISCHARGE INSTRUCTIONS AND FOLLOW-UP: The patient was instructed to follow up with her primary physician, Dr. Baltazar Apo, in 1 to 2 weeks. She will need follow-up by ACT team for psychiatric in 2 to 4 weeks. She was set up to get home health services, nurse and nursing aide. PICC line care per protocol was order.   TOTAL TIME  SPENT ON DISCHARGE:  55 minutes.    ____________________________ Lucina Mellow. Manuella Ghazi, MD vss:cc D: 09/19/2012 19:09:01 ET T: 09/19/2012 22:45:32 ET JOB#: [id number]  cc: Kayhan Boardley S. Manuella Ghazi, MD, <Dictator> Sarah "Sallie" Posey Pronto, MD Gonzella Lex, MD Lucina Mellow [facility] MD ELECTRONICALLY SIGNED 09/25/2012 8:19

## 2014-06-08 NOTE — H&P (Signed)
PATIENT NAME:  Cristina [name], Cristina [name] MR#:  [id number] DATE OF BIRTH:  [birth year]  DATE OF ADMISSION:  09/16/2012  PRIMARY CARE PHYSICIAN:  Dr. Baltazar Apo.   REFERRING PHYSICIAN:  Dr. Lenise Arena.   CHIEF COMPLAINT:  Urinary tract infection with resistant Escherichia coli.   HISTORY OF PRESENT ILLNESS:  Cristina [name] is a 54 year old Caucasian female with a history of diabetes mellitus type 2, obesity, hypertension, COPD and schizophrenia.  She has been treated for symptoms of urinary tract infection for more than a month and her symptoms were dysuria associated with frequency of urination along with suprapubic abdominal pain and sometimes right-sided lower abdominal pain described as a bad cramp, spasm-like feeling, sometimes like a knife.  She states it is severe and graded it as 10 on a scale of 10 with escalation and improvement.  She also has some feeling off incomplete emptying of her bladder.  She was treated with oral antibiotics, but she is not getting better.  Finally, a recent urine culture came back showing more than 100,000 of E. coli, however it is resistant to all oral antibiotics and is sensitive only to a few IV antibiotics.  The patient was referred to the hospital for further evaluation and treatment.  The patient denies having any fever.  No chills.  She indicates that occasionally she may see a little bit of blood with the urine. Patient is voicing some suicidal thoughts unsure if serious about what she says but will involve the psychiatrist.  REVIEW OF SYSTEMS:  CONSTITUTIONAL:  Denies any fever.  No chills.  No fatigue.  EYES:  No blurring of vision.  No double vision.  EARS, NOSE, THROAT:  No hearing impairment.  No sore throat.  No dysphagia.  CARDIOVASCULAR:  No chest pain.  No shortness of breath.  No syncope.  RESPIRATORY:  No shortness of breath.  No cough.  No hemoptysis.  GASTROINTESTINAL:  Other than the suprapubic pain and right lower quadrant abdominal pain  occasionally, she has no other abdominal pain.  No vomiting, but she has some nausea.  GENITOURINARY:  Dysuria and frequency of urination as described above.  MUSCULOSKELETAL:  No joint pain or swelling.  No muscular pain or swelling.  INTEGUMENTARY:  No skin rash.  No ulcers.  NEUROLOGY:  No focal weakness.  No seizure activity.  Occasionally she may have headache.  PSYCHIATRY:  Anxiety, schizophrenia versus schizoaffective disorder and bipolar disorder. She reports some suicidal thoughts. ENDOCRINE:  Reports frequency of urination, but no polydipsia.  No heat or cold intolerance.   PAST MEDICAL HISTORY:  Diabetes mellitus type 2 on Levemir and metformin and glipizide.  Systemic hypertension, chronic obstructive pulmonary disease using oxygen as needed. Obstructive sleep apnea on CPAP. Hyperlipidemia, peripheral neuropathy, gastroesophageal reflux disease.  Schizophrenia versus schizoaffective disorder and bipolar disorder.   PAST SURGICAL HISTORY:  Cholecystectomy and tubal ligation.   SOCIAL HABITS:  Ex-chronic smoker.  She quit six years ago.  Prior to that, she used to smoke 4 packs a day since the age of 7.  No history of alcoholism.  She used to drink occasionally, but she quit again six years ago.  No other drug abuse.   FAMILY HISTORY:  Her father died when he committed suicide.  Her mother died after having myocardial infarction.  She has a sister who has diabetes mellitus and maternal grandmother who has diabetes mellitus as well.   ADMISSION MEDICATIONS:  Levemir 42 units twice a day, metformin 500 mg once a day,  glipizide XL 10 mg a day, Lyrica 100 mg 3 times a day, Carafate 1 gram 4 times a day, Quinapril 20 mg once a day, omeprazole 40 mg once a day, metoprolol 50 mg twice a day, meloxicam 7.5 mg twice a day, furosemide 40 mg twice a day, Flexeril 10 mg twice a day and Zyrtec 10 mg a day, atorvastatin 20 mg a day.   ALLERGIES:  CODEINE AND THORAZINE CAUSING SKIN RASH AND BC AS WELL  CAUSING SKIN RASH.   PHYSICAL EXAMINATION: VITAL SIGNS:  Blood pressure 128/67, respiratory rate 20, pulse 90, temperature 98, oxygen saturation 93%.  GENERAL APPEARANCE:  Middle-aged female lying in bed in no acute distress.  HEAD AND NECK:  No pallor.  No icterus.  No cyanosis.  Ear examination revealed normal hearing, no discharge, no lesions.  Examination of the nose showed no ulcers, no discharge, no bleeding.  Oropharyngeal examination revealed normal lips and tongue.  No oral thrush.  No exudates.  Eye examination revealed normal eyelids and conjunctivae.  Pupils about 5 mm, round, equal and reactive to light.  NECK:  Supple.  Trachea at midline.  No thyromegaly.  No cervical lymphadenopathy.  No masses.  HEART:  Revealed normal S1, S2.  No S3, S4.  No murmur.  No gallop.  No carotid bruits.  RESPIRATORY:  Revealed normal breathing pattern without use of accessory muscles.  No rales.  No wheezing.  ABDOMEN:  Obese, soft without tenderness.  No rigidity.  No hepatosplenomegaly, although it is difficult to palpate organs due to her obesity.  No masses.  SKIN:  Revealed no ulcers.  No subcutaneous nodules.  MUSCULOSKELETAL:  No joint swelling.  No clubbing.  NEUROLOGIC:  Cranial nerves II through XII are intact.  No focal motor deficit.  PSYCHIATRIC:  The patient is alert, oriented x 3.  Mood and affect were normal.   LABORATORY FINDINGS:  Her recent urine culture dated for 09/16/2012 showing more than 100,000 colonies of E. coli which was resistant to all oral antibiotics.  It is sensitive to gentamicin, imipenem and tobramycin and also sensitive to ertapenem.  Her recent hemoglobin A1c was 9.4.  Serum glucose 169, BUN 12, creatinine 0.7, sodium 137, potassium 3.7.  Her liver function tests showed albumin of 3.3, alkaline phosphatase 205, AST 16, ALT 28.  TSH was slightly elevated at 7.3.  Urine drug screen was negative.  CBC showed a white count of 9000, hemoglobin 12, hematocrit 35, platelet  count 231.  Urinalysis showed positive nitrite, 25 white blood cells, +2 bacteria.   IMPRESSION: 1.  Urinary tract infection with multiresistant Escherichia coli to all oral antibiotics.  2.  Diabetes mellitus type 2, uncontrolled.  3.  Systemic hypertension.  4.  Chronic obstructive pulmonary disease on oxygen.  5.  Morbid obesity.  6.  Schizophrenia versus schizoaffective disorder, along with bipolar disorder / has some suicidal thoughts.   7.  Peripheral neuropathy.  8.  Hyperlipidemia.  9.  Gastroesophageal reflux disease.  10. Obstructive sleep apnea on CPAP.   PLAN:  We will admit the patient to the hospital.  IV antibiotics started at the Emergency Department using meropenem.  I will change that to gentamicin to use narrow spectrum antibiotic.  We will obtain infectious disease consultation.  Accu-Chek and sliding scale and continue the long list of her home medications as listed.  For deep vein thrombosis prophylaxis, she is at moderate risk, I will place her on Ted stocking and avoid anticoagulation since she reports  occasional blood with the urine.  CPAP for her sleep apnea. Psychiatry consult. Provide a Actuary.  Time spent in evaluating this patient took more than 55 minutes.    ____________________________ Clovis Pu. Lenore Manner, MD amd:ea D: 09/17/2012 01:42:16 ET T: 09/17/2012 03:01:24 ET JOB#: [id number]  cc: Clovis Pu. Lenore Manner, MD, <Dictator> Ellin Saba MD ELECTRONICALLY SIGNED 09/17/2012 6:37

## 2014-06-08 NOTE — Consult Note (Signed)
PATIENT NAME:  Cristina [name], LAITINEN MR#:  [id number] DATE OF BIRTH:  1960/06/17  DATE OF CONSULTATION:  09/17/2012  CONSULTING PHYSICIAN:  Gonzella Lex, MD  IDENTIFYING INFORMATION AND REASON FOR CONSULT: A 54 year old woman with a history of schizoaffective disorder, currently in the hospital because of an ongoing complicated urinary tract infection. Consultation because of complaints of hallucinations and suicidal ideation.   HISTORY OF PRESENT ILLNESS: Information is obtained from the patient and the chart. The patient is not a very good historian, which seems to have been a chronic issue with her, so I am not certain how well her current history reflects what is really going on. She tells me that she has been feeling terrible for months. She says that she sleeps poorly at night and has been having worsening auditory and visual hallucinations. She hears voices at night and sees faces. She tells me that she cannot tell what any of them are saying. She feels like her mood has been down and sad. She feels more tired. Additionally, she is having chronic pain in her back and her bladder. She says that this current urinary tract infection has been there for months and has been treated several times, but has not gone away completely and she is getting tired of the chronic pain. She tells me she has had some thoughts about killing herself. She will not tell me any specific plan she has developed. She says that she does not think she would act on it because she is concerned about her grandchildren and how it would affect them. She is still seeing the [place] team for outpatient psychiatric treatment. Her current psychiatric medication appears to just be her Haldol Decanoate shot. She cannot remember how long ago she took it and does not know the exact dosage of it. She does not report any other new stressor. She was somewhat abrupt in giving her history and started to cry, and after a while said she did not  want to talk anymore.   PAST PSYCHIATRIC HISTORY: The patient has had mental health problems since she was 36. Multiple prior hospitalizations. The last hospitalization here appears to have been in 2006. She has been followed by the ACT team for many years. She has been on multiple medications in the past. Most recently apparently, she is just taking her Haldol Decanoate shot. She has been on Haldol Decanoate shots in the past. Also, I think they have tried to get her onto Risperdal or Invega Decanoate shots, but she cannot remember what the outcome was with that. In the past, she seems to have usually been on antidepressants as well and often oral antipsychotics, but she is not currently taking any of those and she cannot tell me exactly why. She does have a history of suicide attempts but it has been many, many years, predating when we have been seeing her here. She has a past history of substance abuse but it has been many, many years since that was going on as well. The patient's diagnosis is schizoaffective disorder. The current set of symptoms she is presenting with appear to be pretty typical of how she has presented on previous admissions as well.   SOCIAL HISTORY: The patient lives by herself in an apartment. She has a care assistant come into the home 6 days a week to help her out. She says it is a benefit, but she wishes there was more to it. She tries not to be a burden she says  to her daughter. She credits her neighbor with helping her with her medicine. She says the aide cannot do anything with her medicine, and she is frustrated with that and she feels that she has not been taking her medicine correctly as a result.   PAST MEDICAL HISTORY: Obesity. The patient has sleep apnea and uses a CPAP machine. She has chronic pain. Currently has a urinary tract infection. Has diabetes. Has high blood pressure. Has a history of reflux symptoms. Has dyslipidemia. As noted above, she thinks she has not been  taking her medicines correctly. She cannot tell me exactly in what way they have been incorrect, but she mentions this a couple of times.   FAMILY HISTORY: Apparently, there is a family history of mental illness.   REVIEW OF SYSTEMS: Complains of pain in her back and bladder. Complains of depression. She feels frightened of falling asleep, thinking she will not wake up. She has suicidal thoughts without any intention or plan. Has hallucinations, both auditory and visual. Feels fatigued and hopeless.   CURRENT MEDICATIONS: The validity of the recent list is always questionable, but what we had on admission was meloxicam 7.5 mg twice a day, Flexeril 10 mg twice a day, metoprolol 50 mg twice a day, metformin extended-release 500 mg once a day, atorvastatin 20 mg at night, quinapril 20 mg once a day, glipizide XL 10 mg once a day, sucralfate 1 gram 4 times a day, omeprazole 40 mg a day, Lyrica 100 mg 3 times a day and a Haldol Decanoate shot.   ALLERGIES: CODEINE, THORAZINE AND BCs.   MENTAL STATUS EXAMINATION: Overweight woman, looks older than her stated age. Passively cooperative. Eye contact poor. Psychomotor activity sluggish. Speech decreased in total amount. Affect sad to tearful. Mood stated as depressed. Thoughts slightly disorganized, but not grossly bizarre, did not make any obviously delusional statements. Endorses auditory and visual hallucinations. Endorses suicidal ideas without any specific intention. Denies homicidal ideation. The patient is alert and oriented x 4. Intelligence and cognitive function chronically impaired by illness.   LABORATORY RESULTS: Most notable for her urinalysis still reflecting infection. A drug screen was done and was all negative. TSH is a little elevated at 7.36.   ASSESSMENT: A 54 year old woman with schizoaffective disorder, currently with depressed mood, suicidal ideation, increased hallucinations. Multiple factors could be at play including changes in her  medicine, medical problems, social difficulties. These symptoms are similar to what she has had in the past. Currently, I do not have access to the ACT team's records or talking with her care providers there on the weekend. I think we can probably make some changes to her medicines, however, and hope that will be of some help.   TREATMENT PLAN: Restart Remeron 45 mg at night, which was helpful previously. Add Haldol 5 mg at night, which was helpful as her main antipsychotic; current and maybe a little extra would be beneficial for the voices. She has her CPAP ordered, which would be reassuring to her. I will continue to follow up with her and will see if we can get collateral from her outpatient providers after the weekend.   DIAGNOSIS, PRINCIPAL AND PRIMARY:  AXIS I: Schizoaffective disorder, depressed.   SECONDARY DIAGNOSES: AXIS I: No further.  AXIS II: Deferred.  AXIS III: Urinary tract infection, obesity, sleep apnea, hypertension, rule out hypothyroidism, dyslipidemia, diabetes.  AXIS IV: Severe, chronic isolation.  AXIS V: Functioning at time of evaluation: 91.   ____________________________ Gonzella Lex, MD jtc:jm  D: 09/17/2012 17:02:48 ET T: 09/17/2012 17:26:19 ET JOB#: [id number]  cc: Gonzella Lex, MD, <Dictator> Gonzella Lex MD ELECTRONICALLY SIGNED 09/18/2012 18:32

## 2014-06-09 NOTE — Discharge Summary (Signed)
PATIENT NAME:  Cristina [name], Cristina [name] MR#:  [id number] DATE OF BIRTH:  04-Mar-1960  DATE OF ADMISSION:  01/17/2014 DATE OF DISCHARGE:  01/19/2014  ADMITTING DIAGNOSES:  1.  Hyponatremia. 2.  Hypokalemia.  DISCHARGE DIAGNOSES:  1.  Right lower extremity cellulitis.  2.  Right lower extremity chronic ulcer of unclear significance, rule out malignant. 4.  Lower extremity edema. No deep vein thrombosis.  5.  Hyponatremia.  6.  Hypokalemia.  7.  Dehydration. 8.  Diabetes mellitus type 2 with hemoglobin A1c 8.7.  9.  Diabetic neuropathy. 10.  History of bipolar disorder schizophrenia, hyperlipidemia, osteoarthritis, gastroesophageal reflux disease.   DISCHARGE CONDITION: Stable.   DISCHARGE MEDICATIONS: The patient is to continue atorvastatin 10 mg p.o. daily, quinapril 20 mg p.o. daily, Lyrica 100 mg 3 times daily as needed, Remeron 45 mg p.o. at bedtime, cetirizine 10 mg p.o. daily as needed, cyclobenzaprine 10 mg twice daily, meloxicam 7.5 mg twice daily, metoprolol tartrate 50 mg twice daily, aspirin 81 mg p.o. daily, clonazepam 0.5 mg twice daily, glipizide XL 10 mg p.o. daily, omeprazole 40 mg p.o. twice daily, metformin 1 gram once daily, Keflex 500 mg every 8 hours.   HOME OXYGEN: None.   DIET: 2 gram salt, low-fat, low-cholesterol, carbohydrate -controlled diet, regular consistency.   ACTIVITY LIMITATIONS: As tolerated.   Follow-up appointment: With Dr. Baltazar Apo in 2 days after discharge. Dermatology on call first available appointment for right lower extremity ulceration.    CONSULTANTS: Care management, social work.   RADIOLOGIC STUDIES: Chest x-ray: Portable single view January 17, 2014 showed no acute cardiopulmonary disease. Ultrasound of bilateral lower extremities January 18, 2014 revealed no evidence of DVT.   HISTORY OF PRESENT ILLNESS: The patient is a 54 year old female with past medical history significant for history of diabetes mellitus who presented to the hospital with  complaints of generalized body cramps, as well as weakness. Please refer to Dr. Edward Jolly admission note on January 17, 2014. On arrival to the hospital, patient's vital signs: Temperature was 99.1, pulse was 98, respiration rate was 20, blood pressure 133/78, saturation was 93% on room air. Physical exam revealed lower extremity swelling and some right lower extremity redness, erythema. Patient's laboratory data done on arrival to the hospital on January 17, 2014 showed sodium level 123, potassium of 3.3, BUN of 19, glucose 161. Otherwise BMP was unremarkable. The patient's osmolarity was 263. Hemoglobin A1c was 8.7. Liver enzymes were unremarkable. Total albumin level was 3.0, alkaline phosphatase 154. Cardiac enzymes were unremarkable. CBC was within normal limits. The patient's urine osmolarity was 184. Urinalysis was unremarkable for 2 white blood cells, 1+ bacteria, but negative for nitrites or leukocyte esterase. No red blood cells were seen. EKG showed normal sinus rhythm at 97 beats per minute, incomplete right bundle branch block, inferior infarct, as well as anterior infarct, which were already cited in the past in 2000, but no significant change since August 2015. Chest x-ray was unremarkable. Patient was admitted to the hospital for further evaluation. She was started on IV fluids and her diuretics were suspended. With IV fluid administration, as well as potassium supplementation, her sodium, as well as potassium, normalized by January 19, 2014. It was felt that the patient had hyponatremia, hypokalemia related to dehydration.   She was complaining of right lower extremity erythema, for which was initiated on Keflex for which the patient's erythema subsided. The patient is to continue antibiotic therapy for 7 more days to complete course. She was noted to have nodule/ulceration on  anterolateral shin of her right lower extremity, which was of unclear significance. It was felt that patient would benefit  from dermatology consultation as outpatient to evaluate this skin change. In regard to the lower extremity edema, the patient did have Doppler ultrasound which was negative for DVT, although we felt that patient may benefit from diuretics. It was felt that because of her poor diabetic control, she would get dehydrated very quickly with diuretic use for her lower extremity edema. We felt the patient best would be served initiating her on TED hoses. In regard to the diabetes mellitus, since the patient's hemoglobin A1c was found to be 8.7, the patient's diabetic medications were advanced. We initiated higher doses of metformin, although we felt that the patient may benefit from also glipizide advancement which can be done as an outpatient, as well. In regards to her chronic medical problems including bipolar disorder, schizophrenia, hyperlipidemia, gastroesophageal reflux disease, osteoarthritis; no changes were made. The patient is to continue her outpatient medications. On the day of discharge, she felt satisfactory, did not complain of any significant discomfort. Her vitals were stable with temperature of 98.2, pulse was 89, respiration rate was 20, blood pressure 115/74, saturation was 91% to 94% on room air at rest.   TIME SPENT: 40 minutes.   ____________________________ Theodoro Grist, MD rv:am D: 01/19/2014 17:12:43 ET T: 01/20/2014 01:39:28 ET JOB#: [id number]  cc: Theodoro Grist, MD, <Dictator> Cristina "Sallie" Posey Pronto, MD [place] MD ELECTRONICALLY SIGNED 02/13/2014 21:03

## 2014-06-09 NOTE — Discharge Summary (Signed)
Dates of Admission and Diagnosis:  Date of Admission 09-Aug-2013   Date of Discharge 09-Aug-2013   Admitting Diagnosis UTI   Final Diagnosis 1. E coli UTI 2. RLE cellulitis    Chief Complaint/History of Present Illness CHIEF COMPLAINT: Sent in from PCPs office for multi-drug resistant urinary tract infection and need for IV antibiotics.   HISTORY OF PRESENT ILLNESS: Ms. Cristina [name] is a 54 year old morbidly obese, Caucasian female with past medical history significant for insulin-dependent diabetes mellitus, O2 dependent COPD, obstructive sleep apnea on CPAP, schizoaffective disorder, irritable bowel syndrome, history of multiple UTIs in the past, and congestive heart failure with diastolic dysfunction was sent in from [facility] clinic today with the aforementioned complaints. The patient has been having dysuria and low-grade fevers at home, and so she was seen by Dr. Posey Pronto who ordered urine test and urine cultures and cultures growing multidrug Escherichia coli which are only sensitive to IV antibiotics like imipenem, gentamicin, and ertapenem.  The patient has some dysuria, fullness in the bladder, increased frequency of urination. She also has noticed that both her lower extremities have been swollen for the last month, but the right lower extremity is more swollen than left one, and also tender to touch. There is also a healing very small ulcer on the shin and was started on Cipro for that as an outpatient.   Allergies:  BC: Rash  Codeine: Rash  Thorazine: Rash  LabObservation:  24-Jun-15 09:40   OBSERVATION Reason for Test Swelling  Hepatic:  23-Jun-15 17:15   Bilirubin, Total 0.4  Alkaline Phosphatase  168 (45-117 NOTE: New Reference Range 01/06/13)  SGPT (ALT) 41  SGOT (AST) 21  Total Protein, Serum 7.0  Albumin, Serum 3.6  Routine Micro:  23-Jun-15 17:29   Micro Text Report URINE CULTURE   COMMENT                   MIXED BACTERIAL ORGANISMS   COMMENT                    RESULTS SUGGESTIVE OF CONTAMINATION   ANTIBIOTIC                       Specimen Source CLEAN CATCH  Culture Comment MIXED BACTERIAL ORGANISMS  Culture Comment . RESULTS SUGGESTIVE OF CONTAMINATION  Result(s) reported on 10 Aug 2013 at 08:37AM.  Routine Chem:  23-Jun-15 17:15   Glucose, Serum  204  BUN 13  Creatinine (comp)  0.56  Sodium, Serum  135  Potassium, Serum 3.9  Chloride, Serum  97  CO2, Serum 32  Calcium (Total), Serum 8.9  Anion Gap  6  Osmolality (calc) 276  eGFR (African American) >60  eGFR (Non-African American) >60 (eGFR values <54m/min/1.73 m2 may be an indication of chronic kidney disease (CKD). Calculated eGFR is useful in patients with stable renal function. The eGFR calculation will not be reliable in acutely ill patients when serum creatinine is changing rapidly. It is not useful in  patients on dialysis. The eGFR calculation may not be applicable to patients at the low and high extremes of body sizes, pregnant women, and vegetarians.)  24-Jun-15 05:17   Result Comment LABS - This specimen was collected through an   - indwelling catheter or arterial line.  - A minimum of 512m of blood was wasted prior    - to collecting the sample.  Interpret  - results with caution.  Result(s) reported on 09 Aug 2013 at 06:25AM.  Glucose, Serum  193  BUN 10  Creatinine (comp)  0.54  Sodium, Serum 138  Potassium, Serum 4.1  Chloride, Serum 101  CO2, Serum 29  Calcium (Total), Serum 9.0  Anion Gap 8  Osmolality (calc) 280  eGFR (African American) >60  eGFR (Non-African American) >60 (eGFR values <57m/min/1.73 m2 may be an indication of chronic kidney disease (CKD). Calculated eGFR is useful in patients with stable renal function. The eGFR calculation will not be reliable in acutely ill patients when serum creatinine is changing rapidly. It is not useful in  patients on dialysis. The eGFR calculation may not be applicable to patients at the low and high  extremes of body sizes, pregnant women, and vegetarians.)  Routine UA:  23-Jun-15 17:29   Color (UA) Yellow  Clarity (UA) Clear  Glucose (UA) Negative  Bilirubin (UA) Negative  Ketones (UA) Negative  Specific Gravity (UA) 1.003  Blood (UA) Negative  pH (UA) 6.0  Protein (UA) Negative  Nitrite (UA) Positive  Leukocyte Esterase (UA) Negative (Result(s) reported on 08 Aug 2013 at 05:56PM.)  RBC (UA) <1 /HPF  WBC (UA) 1 /HPF  Bacteria (UA) TRACE  Epithelial Cells (UA) 1 /HPF (Result(s) reported on 08 Aug 2013 at 05:56PM.)  Routine Hem:  23-Jun-15 17:15   WBC (CBC) 10.1  RBC (CBC) 4.92  Hemoglobin (CBC) 12.1  Hematocrit (CBC) 38.5  Platelet Count (CBC) 253  MCV  78  MCH  24.6  MCHC  31.4  RDW  17.3  Neutrophil % 74.9  Lymphocyte % 15.0  Monocyte % 8.1  Eosinophil % 1.4  Basophil % 0.6  Neutrophil #  7.6  Lymphocyte # 1.5  Monocyte # 0.8  Eosinophil # 0.1  Basophil # 0.1 (Result(s) reported on 08 Aug 2013 at 05:48PM.)  24-Jun-15 05:17   WBC (CBC) 7.3  RBC (CBC) 4.53  Hemoglobin (CBC)  11.6  Hematocrit (CBC) 35.6  Platelet Count (CBC) 234  MCV  79  MCH  25.6  MCHC 32.5  RDW  17.3  Neutrophil % 70.2  Lymphocyte % 17.6  Monocyte % 9.2  Eosinophil % 1.9  Basophil % 1.1  Neutrophil # 5.1  Lymphocyte # 1.3  Monocyte # 0.7  Eosinophil # 0.1  Basophil # 0.1   Pertinent Past History:  Pertinent Past History PAST MEDICAL HISTORY: 1.  Chronic low back pain.  2.  Diastolic congestive heart failure.  3.  Obstructive sleep apnea on CPAP.  4.  COPD on 3 liters home oxygen.  5.  Schizoaffective disorder.  6.  Insulin-dependent diabetes mellitus.  7.  Morbid obesity.  8.  Post menopausal syndrome.  9.  Irritable bowel syndrome.  10.  Multidrug UTIs in the past.   Hospital Course:  Hospital Course 53y/o F with multiple medical problems DIABETES MELLITUS, High Blood Pressure (Hypertension), CHF, Irritable Bowel Syndrome,chronic back pain and multiple UTIs sent in  from PCP office for multidrug resistant Urinary Tract Infection and cultures coming back with MDR E.coli and needs IV ABX  * Multidrug resistant E.coli- check cbc and bmp started invanz, PICC line and care management consult for IV ABX and home health Abx for 5 more days for total 7 day treatment  * Bilateral lower extr edema- has CHF, on lasix bid dopplers lower extr and continue po lasix bid check chest xray  * DM- continue home meds- glipizide, levemir and metformin, Sliding Scale Insulin  * COPD, OSA- cpap, o2, inh, stable  * Chronic back pain- home meds  Time  spent on d/c 40 minutes   Condition on Discharge Fair   Code Status:  Code Status Full Code   PHYSICAL EXAM ON DISCHARGE:  Physical Exam:  GEN obese   NECK supple   RESP normal resp effort  clear BS   CARD regular rate  no murmur   VASCULAR ACCESS RUE PICC   VITAL SIGNS:  Vital Signs: **Vital Signs.:   24-Jun-15 04:14  Vital Signs Type Routine  Temperature Temperature (F) 97.6  Celsius 36.4  Pulse Pulse 77  Respirations Respirations 18  Systolic BP Systolic BP 062  Diastolic BP (mmHg) Diastolic BP (mmHg) 77  Mean BP 89  Pulse Ox % Pulse Ox % 98  Pulse Ox Activity Level  At rest  Oxygen Delivery Room Air/ 21 %    13:42  Vital Signs Type Routine  Temperature Temperature (F) 98.5  Celsius 36.9  Temperature Source oral  Pulse Pulse 86  Respirations Respirations 20  Systolic BP Systolic BP 376  Diastolic BP (mmHg) Diastolic BP (mmHg) 74  Mean BP 84  Pulse Ox % Pulse Ox % 95  Pulse Ox Activity Level  At rest  Oxygen Delivery Room Air/ 21 %   DISCHARGE INSTRUCTIONS HOME MEDS:  Medication Reconciliation: Patient's Home Medications at Discharge:     Medication Instructions  atorvastatin 20 mg oral tablet  1 tab(s) orally once a day (at bedtime) for high cholesterol   quinapril 20 mg oral tablet  1 tab(s) orally once a day for high blood pressure   omeprazole 40 mg oral delayed release capsule   1 cap(s) orally once a day for reflux   lyrica 100 mg oral capsule  1 cap(s) orally 3 times a day, As Needed for nerve pain   mirtazapine 45 mg oral tablet  1 tab(s) orally once a day (at bedtime)   cetirizine 10 mg oral tablet  1 tab(s) orally once a day, As Needed for allergies   cyclobenzaprine 10 mg oral tablet  1 tab(s) orally 2 times a day   furosemide 40 mg oral tablet  1 tab(s) orally 1 to 2 times a day   glipizide xl 5 mg oral tablet, extended release  1 tab(s) orally once a day for diabetes   meloxicam 7.5 mg oral tablet  1 tab(s) orally 2 times a day for back pain   metformin 500 mg oral tablet, extended release  1 tab(s) orally once a day for diabetes   metoprolol tartrate 50 mg oral tablet  1 tab(s) orally 2 times a day   aspirin low dose 81 mg oral delayed release tablet  1 tab(s) orally once a day for heart protection   clonazepam 0.5 mg oral tablet  1 tab(s) orally 2 times a day   levemir 100 units/ml subcutaneous solution  45 unit(s) subcutaneous 2 times a day   aleve sodium 220 mg oral tablet  1 tab(s) orally 2 times a day   ertapenem  1 gram(s) IV every 24 hours. Start 6/25     Physician's Instructions:  Home Health? Yes   Home Health Service Nurse  IV abx   Diet Low Fat, Low Cholesterol  Carbohydrate Controlled (ADA) Diet   Activity Limitations As tolerated   Return to Work Not Applicable   Time frame for Follow Up Appointment 1-2 weeks  PCP   Other Comments PICC line protocol with labs.   Electronic Signatures: Alba Destine (MD)  (Signed 25-Jun-15 16:46)  Authored: ADMISSION DATE AND DIAGNOSIS, CHIEF COMPLAINT/HPI,  Allergies, PERTINENT LABS, PERTINENT PAST HISTORY, HOSPITAL COURSE, PHYSICAL EXAM ON DISCHARGE, VITAL SIGNS, DISCHARGE INSTRUCTIONS HOME MEDS, PATIENT INSTRUCTIONS   Last Updated: 25-Jun-15 16:46 by Alba Destine (MD)

## 2014-06-09 NOTE — H&P (Signed)
PATIENT NAME:  Cristina [name], Cristina [name] MR#:  [id number] DATE OF BIRTH:  11/23/1960  DATE OF ADMISSION:  08/08/2013  PRIMARY CARE PHYSICIAN: Dr. Baltazar Apo at [facility].   CHIEF COMPLAINT: Sent in from PCPs office for multi-drug resistant urinary tract infection and need for IV antibiotics.   HISTORY OF PRESENT ILLNESS: Ms. Cassetta is a 54 year old morbidly obese, Caucasian female with past medical history significant for insulin-dependent diabetes mellitus, O2 dependent COPD, obstructive sleep apnea on CPAP, schizoaffective disorder, irritable bowel syndrome, history of multiple UTIs in the past, and congestive heart failure with diastolic dysfunction was sent in from [facility] clinic today with the aforementioned complaints. The patient has been having dysuria and low-grade fevers at home, and so she was seen by Dr. Posey Pronto who ordered urine test and urine cultures and cultures growing multidrug Escherichia coli which are only sensitive to IV antibiotics like imipenem, gentamicin, and ertapenem.  The patient has some dysuria, fullness in the bladder, increased frequency of urination. She also has noticed that both her lower extremities have been swollen for the last month, but the right lower extremity is more swollen than left one, and also tender to touch. There is also a healing very small ulcer on the shin and was started on Cipro for that as an outpatient.   PAST MEDICAL HISTORY: 1.  Chronic low back pain.  2.  Diastolic congestive heart failure.  3.  Obstructive sleep apnea on CPAP.  4.  COPD on 3 liters home oxygen.  5.  Schizoaffective disorder.  6.  Insulin-dependent diabetes mellitus.  7.  Morbid obesity.  8.  Post menopausal syndrome.  9.  Irritable bowel syndrome.  10.  Multidrug UTIs in the past.   PAST SURGICAL HISTORY:  1.  Tubal ligation.  2.  Cholecystectomy.   ALLERGIES TO MEDICATIONS: BC POWDER, CODEINE, AND THORAZINE.   CURRENT HOME MEDICATIONS:  1.  Omeprazole 20 mg  p.o. b.i.d.  2.  Anusol rectal suppository twice a day as needed for hemorrhoids.  3.  Colace 100 mg p.o. b.i.d. p.r.n. for constipation.  4.  Econazole nitrate clean to feet twice a day.  5.  Lidoderm 5% patch to back daily.  6.  Levemir 45 units subcutaneous in the morning and 15 units subcutaneous at bedtime.  7.  Haldol decanoate injection monthly for schizophrenia intramuscular.  8.  Quinapril 20 mg p.o. daily.  9.  Diphenoxylate atropine p.r.n. for diarrhea.  10.  Metformin 500 mg p.o. daily.  11.  Cetirizine 10 mg p.o. daily.  12.  Atorvastatin 20 mg p.o. at bedtime. 13.  Spiriva HandiHaler 1 inhalation daily.  14.  Flexeril 10 mg p.o. b.i.d.  15.  Mirtazapine 45 mg p.o. daily.  16.  Lyrica 100 mg p.o. t.i.d. p.r.n. for nerve pain. 17.  Meloxicam 7.5 mg p.o. b.i.d.  18.  Albuterol inhaler 2 puffs every 4 hours as needed.  19.  Metoprolol 50 mg p.o. b.i.d.  20.  Glipizide 10 mg p.o. daily.  21.  Symbicort 160/4.5 two puffs b.i.d.  22.  Aspirin 81 mg p.o. daily.  23.  Colchicine 0.6 mg p.r.n. for gout.   24.  Three liters home oxygen.  25.  Lasix 40 mg p.o. b.i.d.  26.  Tylenol 1000 mg by mouth twice daily as needed for pain.   SOCIAL HISTORY: Lives at home, uses walker, and also has a wheelchair. No history of any smoking currently. He quit smoking about six years ago. No alcohol use.  FAMILY HISTORY: Lung cancer in mother and significant for arthritis.   REVIEW OF SYSTEMS:  CONSTITUTIONAL: Positive for fatigue, low-grade fevers and also weakness. EYES:  No blurred vision, double vision, inflammation, or glaucoma. Uses glasses.   EAR, NOSE, AND THROAT: No tinnitus, ear pain, hearing loss, epistaxis or discharge.  RESPIRATORY: Positive for cough,  dyspnea, COPD.  No wheezing. No hemoptysis.  CARDIOVASCULAR: No chest pain. Positive for orthopnea. No PND. No palpitations or syncope.  GASTROINTESTINAL: Positive for occasional nausea, no vomiting. Positive for diarrhea and  constipation due to irritable bowel syndrome. No abdominal pain.  Feels distended in her belly.  GENITOURINARY: Positive for dysuria and increased frequency of urination and also incontinence.  ENDOCRINE: No polyuria, nocturia, thyroid problems, heat or cold intolerance.  HEMATOLOGY: No anemia, easy bruising or bleeding.  SKIN: No acne, rash or lesions.  MUSCULOSKELETAL: Positive for low back pain, arthritis, and gout.  NEUROLOGIC: No CVA, TIA, or seizures.  PSYCHOLOGIC: History of depression and anxiety.  Currently, no active symptoms. No insomnia.   PHYSICAL EXAMINATION:  VITAL SIGNS: Temperature 98.8 degrees Fahrenheit,  pulse 92, respirations 18, blood pressure 109/79, pulse oximetry 93% on room air.  GENERAL: Heavily built, well-nourished female sitting in bed, not in any acute distress.  HEENT: Normocephalic, atraumatic. Pupils equal, round, and reacting to light. Anicteric sclerae. Extraocular movements intact.  Oropharynx is clear without mass or exudates.  NECK: Supple. No thyromegaly, JVD, or carotid bruits. No lymphadenopathy.  LUNGS: Moving air bilaterally. No wheeze or crackles. No use of accessory muscles for breathing.  CARDIOVASCULAR: S1, S2, regular rate and rhythm. No murmurs, rubs, or gallops.  ABDOMEN: Obese, distended with a lot of subcutaneous edema. Unable to palpate any liver or spleen. Normal bowel sounds.  EXTREMITIES: Right leg is mildly erythematous with a healing ulcer on the shin, but has 3+ edema and very tender to touch. Left leg has 2+ edema.  Non-erythematous.   Pedal and dorsalis pedis pulses palpable bilaterally. No clubbing or cyanosis.  SKIN: No acne, rash, or lesions.  LYMPHATICS: No cervical lymphadenopathy.  NEUROLOGIC: Cranial nerves intact. No focal motor or sensory deficits. PSYCHOLOGIC: The patient is awake, alert, oriented x 3.   LABORATORY DATA: Pending at this time.   ASSESSMENT AND PLAN: A 54 year old female with multiple medical problems  including diabetes, hypertension, irritable bowel syndrome, chronic back pain, multiple urinary tract infections sent in from primary care physician office for multi-drug resistant Escherichia coli infection and symptoms dysuria and need of IV antibiotics.   1.  Multi-drug resistant Escherichia coli. Check CBC and BMP.  Started on [place]. PICC line ordered and care management consulted for IV antibiotics and home health nursing setup.  2.  Bilateral lower extremity edema, actually going on for almost a month.    3. Congestive heart failure on Lasix b.i.d., which she will continue.  However, the right leg is more swollen than the left one, so we will get Dopplers and also check a chest x-ray.   4.  Diabetes mellitus. Continue home medications. She is on metformin, glipizide, Levemir, and sliding scale insulin.  5.  Chronic obstructive pulmonary disease and obstructive sleep apnea. CPAP at night.  Continue Inhalers.    6.  Chronic low back pain.  Home medications.  7.  Deep venous prophylaxis with subcutaneous heparin.   8.  CODE STATUS: FULL CODE.  TIME SPENT ON ADMISSION: 50 minutes.    ____________________________ Gladstone Lighter, MD rk:ts D: 08/08/2013 17:16:34 ET T: 08/08/2013 18:07:30 ET  JOB#: [id number]  cc: Gladstone Lighter, MD, <Dictator> Sarah "Sallie" Posey Pronto, MD Gladstone Lighter MD ELECTRONICALLY SIGNED 08/24/2013 10:22

## 2014-06-09 NOTE — H&P (Signed)
PATIENT NAME:  Cristina [name], Cristina [name] MR#:  [id number] DATE OF BIRTH:  06/09/1960  DATE OF ADMISSION:  01/17/2014  PRIMARY CARE PHYSICIAN: Denton Lank, MD   CHIEF COMPLAINT: Generalized body cramps and weakness.   HISTORY OF PRESENT ILLNESS: This is a 54 year old female who presents to the hospital due to some cramping and also having some generalized weakness. She was also having some vague chest pain; therefore her daughter called EMS and she was brought to the hospital. The patient here in the ER, the chest pain has resolved. Although, on routine blood work, she was noted to be hyponatremic with a sodium of 123. The patient herself has underlying bipolar disorder and anxiety; therefore, is not the best historian. Due to her acute hyponatremia, hospitalist services were contacted for further treatment and evaluation. The patient denies any nausea, vomiting, diarrhea, any dysuria, hematuria, or any other associated symptoms presently.   REVIEW OF SYSTEMS: CONSTITUTIONAL: No documented fever. No weight gain, no weight loss.  EYES: No blurry or double vision.  EARS, NOSE, AND THROAT: No tinnitus. No postnasal drip. No redness of the oropharynx.  RESPIRATORY: No cough. No wheeze. No hemoptysis. No dyspnea.  CARDIOVASCULAR: Positive chest pain. No orthopnea, palpitations, syncope.  GASTROINTESTINAL: No nausea. No vomiting, diarrhea. No abdominal pain. No melena or hematochezia.  GENITOURINARY: No dysuria or hematuria.  ENDOCRINOLOGY: No polyuria, nocturia, heat or cold intolerance.  HEMATOLOGIC: No anemia. No bruising. No bleeding.  INTEGUMENTARY: No rashes. No lesions.  MUSCULOSKELETAL: No arthritis. No swelling. No gout.  NEUROLOGIC: No numbness, tingling. No ataxia. No seizure activity.  PSYCHIATRIC: Positive anxiety and bipolar disorder. No ADD.   PAST MEDICAL HISTORY: Consistent with bipolar disorder, schizophrenia, diabetes, hyperlipidemia, diabetic neuropathy, osteoarthritis, GERD.   ALLERGIES:  BC POWDER, CODEINE AND THORAZINE.   SOCIAL HISTORY: The patient used to be a smoker, quit 7 to 8 years ago. Does have a 40 pack-year smoking history. No alcohol abuse. No illicit drug abuse. Lives by herself.   FAMILY HISTORY: Mother and father are both deceased. Both had a history of schizophrenia.   CURRENT MEDICATIONS: The patient cannot recall her medications and we cannot obtain list as her pharmacy is closed. This is based on her previous discharge summary: Atorvastatin 20 mg at bedtime, quinapril 20 mg daily, omeprazole 40 mg daily, Lyrica 100 mg t.i.d., Remeron 45 minutes at bedtime, cetirizine 10 mg daily, cyclobenzaprine 10 mg b.i.d., Lasix 40 mg b.i.d., glipizide XL 5 mg daily, meloxicam 7.5 mg b.i.d., metformin 5 mg daily, metoprolol tartrate 50 mg b.i.d., aspirin 81 mg daily, Klonopin 0.5 mg b.i.d., Levemir 45 units b.i.d., Aleve 220 mg b.i.d.   PHYSICAL EXAMINATION: Presently is as follows:  VITAL SIGNS: Temperature 99.1, pulse 98, respirations 20, blood pressure 133/78, saturation is 93% on room air.  GENERAL: She is a pleasant-appearing female but in no apparent distress.  HEENT: Atraumatic, normocephalic. Extraocular muscles are intact. Pupils equal and reactive to light. Sclerae anicteric. No conjunctival injection. No pharyngeal erythema.  NECK: Supple. There is no jugular venous distention. No bruits, no lymphadenopathy, no thyromegaly.  HEART: Regular rate and rhythm. No murmurs, no rubs, no clicks.  LUNGS: Clear to auscultation bilaterally. No rales or rhonchi. No wheezes.  ABDOMEN: Soft, flat, nontender, nondistended. Has good bowel sounds. No hepatosplenomegaly appreciated.  EXTREMITIES: No evidence of any cyanosis, clubbing or peripheral edema. Has +2 pedal and radial pulses bilaterally.  NEUROLOGIC: The patient is alert, awake, oriented x 3 with no focal motor or sensory deficits appreciated bilaterally.  SKIN: Moist and warm with no rashes appreciated.  LYMPHATIC: There  is no cervical or axillary lymphadenopathy.   LABORATORY DATA: Shows serum glucose 161, BUN 19, creatinine 1.08, sodium 123, potassium 3.3, chloride 80, bicarbonate 31. Troponin less than 0.02. White cell count 10.9, hemoglobin 12.8, hematocrit 39.5, platelet count 229,000. The patient did have a chest x-ray done which showed no evidence of acute cardiopulmonary disease.   ASSESSMENT AND PLAN: This is a 54 year old female with history of bipolar disorder schizophrenia, diabetes, hyperlipidemia, diabetic neuropathy, osteoarthritis, gastroesophageal reflux disease, who presented to the hospital due to generalized body cramps and weakness and noted to be acutely hyponatremic.  1.  Hyponatremia, likely the cause of the patient's weakness and cramps. I suspect this is likely hypovolemic hyponatremia. I will hydrate the patient with IV fluids, follow the sodium, hold her Lasix for now.  2.  Diabetes. I will continue her glipizide, metformin and sliding scale insulin. Follow blood sugars.  3.  Diabetic neuropathy. Continue Lyrica.  4.  Hyperlipidemia. Continue atorvastatin.  5.  Hypertension, presently hemodynamically stable. Continue metoprolol and quinapril. 6.  History of bipolar disorder and anxiety. Continue Klonopin and Remeron.  7.  Hypokalemia. I will replace her potassium and repeat the level in the morning.   CODE STATUS: The patient is a full code.   TIME SPENT ON ADMISSION: Fifty minutes.    ____________________________ Belia Heman. Verdell Carmine, MD vjs:TT D: 01/17/2014 20:19:24 ET T: 01/17/2014 20:43:04 ET JOB#: [id number]  cc: Belia Heman. Verdell Carmine, MD, <Dictator> Henreitta Leber MD ELECTRONICALLY SIGNED 01/20/2014 15:58

## 2014-06-13 NOTE — H&P (Signed)
PATIENT NAME:  Cristina [name], Cristina [name] MR#:  [id number] DATE OF BIRTH:  20-Dec-1960  DATE OF ADMISSION:  02/16/2014  REFERRING EMERGENCY ROOM PHYSICIAN: Dr. Corky Downs.   PRIMARY CARE PHYSICIAN: Dr. Denton Lank.    CHIEF COMPLAINT: Dysuria and fever.   HISTORY OF PRESENT ILLNESS: This 54 year old woman with past medical history of diabetes, hypertension, morbid obesity, and hyperlipidemia, as well as possible CHF, presents today from home via EMS with dysuria, nausea, vomiting, and generalized weakness. She reports that she has been having dysuria and hematuria for the past 2 weeks. She tried to see her primary care physician but was unable to get an appointment. For the past 2 days she has been very nauseated and has had dry heaves, no emesis, no hematemesis. She last ate yesterday afternoon. She has not been able to eat or drink since that time. She has lower abdominal pain in the suprapubic area which is 5 to 6 out of 10 on the pain scale, worse with movement. She reports having fevers and chills and on presentation to the Emergency Room her temperature is 103.2. Preliminary workup shows a positive UA with 1115 white blood cells per high-powered field. She is being admitted for urinary tract infection.   PAST MEDICAL HISTORY:  1.  Last hospitalization was December 2 to December 4 of 2015 for right lower extremity cellulitis treated with Keflex.  2.  Diabetes mellitus type 2, hemoglobin A1c December 2015 is 8.7.  3.  Hyperlipidemia.  4.  Hypertension.  5.  The chart and the patient mentions congestive heart failure, there is no 2-D echocardiogram or catheterization in [facility]. Unsure if this is diastolic or systolic failure.  6.  Anxiety.  7.  Bipolar disorder.  8.  Schizoaffective disorder.  9.  Morbid obesity.  10.  Gastroesophageal reflux disease.  11.  COPD.    PAST SURGICAL HISTORY:  1.  Cholecystectomy.  2.  Bilateral tubal ligation.   SOCIAL HISTORY: The patient lives in [facility]. She lives in her own apartment. She uses a wheelchair and occasionally a walker for mobility. She does not smoke cigarettes or drink alcohol. She used to smoke, but quit 10 years ago. She does have a 40 pack-year smoking history. No illicit substance abuse. She lives alone. Her daughter lives nearby in [place].   FAMILY MEDICAL HISTORY: Both parents have a history of schizophrenia. Her mother had coronary artery disease. A grandmother had a stroke. There is no colon or breast cancer in the family. Her father and grandmother had diabetes.   REVIEW OF SYSTEMS:  CONSTITUTIONAL: Positive for fevers, fatigue, weakness. No change in weight. There is some suprapubic abdominal pain.  HEENT: No change in hearing or vision, no pain in the eyes or ears, no sore throat, she does report a new swelling on the right side of her throat which is slightly painful when swallowing, no sinus discharge or postnasal drip.  RESPIRATORY: No cough, wheezing, shortness of breath, hemoptysis, or dyspnea. She does have a history of COPD.   CARDIOVASCULAR: No chest pain, orthopnea, recent edema, palpitations, or syncope.  GASTROINTESTINAL: Positive for nausea, no vomiting, but positive for dry heaves, no diarrhea, positive for constipation, positive for suprapubic abdominal pain, no melena, hematochezia.   GENITOURINARY: Positive for dysuria and frequency.  HEMATOLOGIC: No anemia, easy bruising, or bleeding.  MUSCULOSKELETAL: No new pain in the neck, back, shoulders, knees, or hips. She does have chronic back pain most likely due to osteoarthritis, no gout.  NEUROLOGIC: No  focal numbness or weakness. No dysarthria or seizure, no history of CVA. No dementia or memory loss.  PSYCHIATRIC: She does have a history of bipolar disorder and anxiety, which seem to be controlled at this time, also possible diagnosis of schizoaffective disorder is noted in the chart.  ALLERGIES: THE PATIENT IS ALLERGIC TO BC POWDER, CODEINE,  AND THORAZINE.   HOME MEDICATIONS:  1. Quinapril 20 mg 1 tablet once a day.  2. Nexium 40 mg once a day.  3. Mirtazapine 45 mg 1 tablet once a day at bedtime.  4. Metoprolol tartrate 50 mg 1 tablet twice a day.  5. Metformin extended release 500 mg 2 tablets once a day.  6. Meloxicam 7.5 mg 1 tablet twice a day for back pain.  7. Lyrica 100 mg 1 capsule 3 times a day.  8. Glipizide XL 10 mg 1 tablet once a day.  9. Furosemide 40 mg 1 tablet once a day.  10. Cyclobenzaprine 10 mg 1 tablet twice a day.  11. Clonazepam 0.5 mg 1 tablet 2 times a day.  12. Cetirizine 10 mg 1 tablet once a day as needed for allergies.  13. Atorvastatin 20 mg 1 tablet once a day at bedtime.  14. Aspirin 81 mg 1 tablet once a day.   PHYSICAL EXAMINATION:   VITAL SIGNS: Temperature 103.2, pulse 125, respirations 22, blood pressure 123/86, oxygenation 99% on room air.  GENERAL: No acute distress resting comfortably in the exam bed.  HEENT: Pupils equal, round, and reactive to light, extraocular motion is intact, conjunctivae are clear, oral mucous membranes are dry, fair dentition, posterior oropharynx is clear with no exudate, edema, erythema, there are bilateral cervical lymph nodes which are palpable. She notes that the lymph nodes on the right are tender. Trachea is midline.  NECK: Obese.  RESPIRATORY: Lungs are clear to auscultation bilaterally with good air movement. No respiratory distress. No coughing.  CARDIOVASCULAR: Tachycardic with regular rhythm, no murmurs, rubs, or gallops, no peripheral edema, peripheral pulses are 1 +.  ABDOMEN: Abdomen is distended, soft, there is suprapubic tenderness, bowel sounds are normal, obese, no guarding, no rebound tenderness.  EXTREMITIES: No joint effusions, range of motion is normal. Strength is 5 out of 5 throughout on evoked strength testing, but she does seem to have poor muscle tone in the lower extremities. NEUROLOGIC: Cranial nerves II through XII are intact.  Muscle strength is 5 out of 5 throughout, she does have decreased tone in the lower extremities, I think this is due to deconditioning, sensation is intact.  PSYCHIATRIC: The patient is alert and oriented x 4 with good insight into her clinical condition, she does not show any signs of uncontrolled depression or anxiety at this time.  LABORATORY DATA: Sodium 137, potassium 3.4, chloride 98, bicarbonate 28, BUN 10, creatinine 0.88, blood sugar 235. LFTs, decreased albumin at 2.8, alkaline phosphatase is 178. Troponin is less than 0.02. White blood cells 8.4, hemoglobin 11.7, platelets 196,000, MCV is 81. INR is 1.2.  UA positive with 1115 white blood cells per high-powered field. Lactate is 1.4.   IMAGING: Chest x-ray shows no acute cardiopulmonary process.   ASSESSMENT AND PLAN:  1.  Urinary tract infection: The patient is not septic. She is tachycardic and febrile, but she has no leukocytosis, hypotension, lactate is normal, and there is no sign of end organ damage. She does have a severe infection. Urine and blood cultures are pending. Rocephin has been started. She is being admitted for observation and  IV antibiotics.  2.  Fever: This is likely due to urinary tract infection. We will continue with Tylenol, add Motrin if needed.  3.  Tachycardia: This is likely due to fever. Continue with Tylenol, provide gentle hydration. 4.  Hypokalemia: Replace potassium, check magnesium, recheck in the morning. Control nausea to prevent further vomiting.  5.  Nausea and vomiting: We will provide Zofran for relief of symptoms and advance diet as tolerated.  6.  Diabetes mellitus, last hemoglobin A1c in December 2015 is 8.7 indicating fair control at home with room for improvement. We will continue with metformin in hospital with sliding scale insulin, hold glipizide while inpatient.  7.  Congestive heart failure: This is noted in the chart and also by the patient but I do not see a 2-D echocardiogram in this  chart. We will use caution with hydration. Maintain low sodium diet and daily weights.  8.  Hyperlipidemia: Continue statin.  9.  Depression: Continue mirtazapine and clonazepam per her home regimen.  10.  Hypertension: Continue with quinapril.  11.  Prophylaxis: The patient is not critically ill and not expected to be inpatient long. Will use SCDs for deep vein thrombosis prophylaxis. She is on omeprazole at home and will continue with Protonix while inpatient.   TIME SPENT ON ADMISSION: 45 minutes.    ____________________________ Earleen Newport. Volanda Napoleon, MD cpw:bu D: 02/16/2014 14:07:30 ET T: 02/16/2014 14:32:04 ET JOB#: [id number]  cc: Barnetta Chapel P. Volanda Napoleon, MD, <Dictator> Aldean Jewett MD ELECTRONICALLY SIGNED 02/18/2014 20:10

## 2014-06-17 NOTE — Discharge Summary (Signed)
PATIENT NAME:  Cristina [name], Cristina [name] MR#:  [id number] DATE OF BIRTH:  1960/04/30  DATE OF ADMISSION:  02/16/2014 DATE OF DISCHARGE:  02/19/2014  ADMITTING DIAGNOSES: 1.  Urinary tract infection.  2.  Fever from urinary tract infection.  3.  Tachycardia. 4.  Hypokalemia. 5.  Nausea and vomiting.  6.  Diabetes mellitus with hemoglobin A1c at 8.7 in December. 7.  Congestive heart failure. 8.  Hyperlipidemia. 9.  Depression.   DISCHARGE DIAGNOSES: 1.  Sepsis from acute cystitis with Escherichia coli and Escherichia coli bacteremia.  2.  Escherichia coli bacteremia.  3.  Fever and tachycardia, resolved.  4.  Hypokalemia, replaced. 5.  Diabetes mellitus. 6.  Congestive heart failure. 7.  Hyperlipidemia. 8.  Depression.   PROCEDURES: None.   CONSULTATIONS: None.   BRIEF HISTORY AND PHYSICAL AND HOSPITAL COURSE: The patient is a 54 year old female who came into the ED via EMS with the chief complaint of dysuria, nausea, vomiting, fever and generalized weakness. She was having dysuria and hematuria for 2 weeks prior to the admission. Complaining of lower abdominal pain, which was 5 to 6 out of 10. Temperature in the ED was 103.2. Urea was positive. The patient was admitted to hospitalist service with acute cystitis. Urine and blood cultures were done. The patient was started on IV Rocephin.  The patient was treated with IV Rocephin, IV fluids for acute cystitis. We followed the urine cultures, which has revealed E. coli. Blood cultures also were positive for E. coli. Meanwhile, the patient has reported that she did not have bowel movements for several days and she was given laxatives and subsequently she had 2 large bowel movements and her abdominal discomfort was relieved. Nausea, vomiting and fevers were resolved. The patient was afebrile for more than 48 hours, after starting IV antibiotics. Nausea and vomiting was resolved. The patient was feeling fine and decision is made to discharge the patient  home on January 4th. The patient was tachycardic, given IV fluids. Tachycardia was significantly improved.  PHYSICAL EXAMINATION:   VITAL SIGNS: On January 4th, temperature 98.9, pulse 101, respirations 18, blood pressure 115/77, pulse ox is 94% at rest on room air.  GENERAL APPEARANCE: Not in acute distress. Morbidly obese.  HEENT: Normocephalic, atraumatic. Pupils are equally reacting to light and accommodation. No scleral icterus. No conjunctival injection. No sinus tenderness. No postnasal drip. Moist mucous membranes.  NECK: Supple. No JVD or thyromegaly. Range of motion is intact.  LUNGS: Clear to auscultation bilaterally. No accessory muscle usage. No anterior chest wall tenderness on palpation.  CARDIAC: S1, S2 normal. Regular rate and rhythm. No murmurs.  GASTROINTESTINAL: Soft, obese. Bowel sounds are positive in all 4 quadrants. Nontender, nondistended. No masses felt. NEUROLOGIC: Alert and oriented X3. Cranial nerves II through XII are grossly intact. Following verbal commands.  EXTREMITIES: No edema. No cyanosis. No clubbing.  SKIN: Warm to touch. Normal turgor. No rashes. No lesions.  MUSCULOSKELETAL: No joint effusion, tenderness, erythema.  PSYCHIATRIC: Normal mood and affect.   DIAGNOSTIC DATA: Hemoglobin A1c 8.4, done on January 3rd. WBC 6.1, hemoglobin 10.4, hematocrit 32.2, platelets 167,000. The patient's lactic acid level is 1.4.  Urine culture has revealed greater than 100,000 colonies of E. coli, pansensitive, except for Bactrim. Blood cultures were also positive for E. coli, which are pansensitive, including ceftriaxone, ciprofloxacin, ceftazidime.  PT-INR was normal.   DISCHARGE MEDICATIONS: Atorvastatin 20 mg 1 tablet p.o. daily at bedtime, quinapril 20 mg 1 tablet p.o. once daily for high blood pressure, Lyrica 100  mg 1 capsule p.o. 3 times a day as needed for nerve pain, mirtazapine 45 mg 1 tablet p.o. once daily at bedtime, cetirizine 10 mg 1 tablet p.o. once daily  as needed for allergies, meloxicam 7.5 mg 1 tablet p.o. 2 times a day for back pain, metoprolol 50 mg 1 tablet p.o. b.i.d., aspirin 81 mg once daily, clonazepam 1.5 mg 1 tablet p.o. 2 times a day, glipizide XL 10 mg 1 tablet p.o. once daily, metformin extended release 500 mg 2 tablets p.o. once daily, Nexium 40 mg 1 capsule p.o. once daily, furosemide 40 mg 1 tablet p.o. once daily, cyclobenzaprine 10 mg 1 tablet p.o. 2 times a day as needed for muscle spasms, Tylenol 325 mg 2 tablets p.o. every 4 hours as needed for mild pain, insulin Lantus 58 units subcutaneously once daily, ciprofloxacin 500 mg 1 tablet p.o. every 12 hours for 5 days, Florastor 250 mg 1 capsule p.o. 2 times a day.  HOME HEALTH: Resumed her home health at the time of discharge. Home health will be provided by nurse and nurse aide.   DISCHARGE DIET: Low-sodium, low-fat, carb-controlled, regular consistency.   DISCHARGE FOLLOWUP: Follow up with primary care physician in 1 to 2 weeks and outpatient diabetes mellitus clinic in 1 to 2 weeks. Continue CPAP at bedtime.  Plan of care was discussed with the patient. She is agreeable with the plan.   TOTAL TIME SPENT ON THE DISCHARGE AND COORDINATION OF CARE: 45 minutes. ____________________________ Nicholes Mango, MD ag:sb D: 02/23/2014 07:48:07 ET T: 02/23/2014 10:07:41 ET JOB#: [id number]  cc: Nicholes Mango, MD, <Dictator> Primary Care Physician Nicholes Mango MD ELECTRONICALLY SIGNED 02/26/2014 22:35

## 2014-07-25 ENCOUNTER — Ambulatory Visit: Payer: Self-pay

## 2014-08-01 ENCOUNTER — Emergency Department: Payer: Medicare Other

## 2014-08-01 ENCOUNTER — Emergency Department
Admission: EM | Admit: 2014-08-01 | Discharge: 2014-08-01 | Disposition: A | Discharge status: Home / Self Care | Payer: Medicare Other | Attending: Emergency Medicine | Admitting: Emergency Medicine

## 2014-08-01 DIAGNOSIS — L209 Atopic dermatitis, unspecified: Secondary | ICD-10-CM | POA: Insufficient documentation

## 2014-08-01 DIAGNOSIS — E119 Type 2 diabetes mellitus without complications: Secondary | ICD-10-CM | POA: Diagnosis not present

## 2014-08-01 DIAGNOSIS — R109 Unspecified abdominal pain: Secondary | ICD-10-CM | POA: Insufficient documentation

## 2014-08-01 DIAGNOSIS — R21 Rash and other nonspecific skin eruption: Secondary | ICD-10-CM | POA: Diagnosis present

## 2014-08-01 DIAGNOSIS — R197 Diarrhea, unspecified: Secondary | ICD-10-CM | POA: Insufficient documentation

## 2014-08-01 HISTORY — DX: Tubal ligation status: Z98.51

## 2014-08-01 HISTORY — DX: Heart failure, unspecified: I50.9

## 2014-08-01 HISTORY — DX: Acquired absence of other specified parts of digestive tract: Z90.49

## 2014-08-01 HISTORY — DX: Unspecified asthma, uncomplicated: J45.909

## 2014-08-01 HISTORY — DX: Type 2 diabetes mellitus without complications: E11.9

## 2014-08-01 LAB — URINALYSIS COMPLETE WITH MICROSCOPIC (ARMC ONLY)
Bilirubin Urine: NEGATIVE
Glucose, UA: NEGATIVE mg/dL
Hgb urine dipstick: NEGATIVE
Ketones, ur: NEGATIVE mg/dL
Nitrite: NEGATIVE
Protein, ur: NEGATIVE mg/dL
Specific Gravity, Urine: 1.003 — ABNORMAL LOW (ref 1.005–1.030)
pH: 6 (ref 5.0–8.0)

## 2014-08-01 LAB — COMPREHENSIVE METABOLIC PANEL
ALT: 24 U/L (ref 14–54)
AST: 18 U/L (ref 15–41)
Albumin: 3.7 g/dL (ref 3.5–5.0)
Alkaline Phosphatase: 105 U/L (ref 38–126)
Anion gap: 9 (ref 5–15)
BUN: 11 mg/dL (ref 6–20)
CO2: 31 mmol/L (ref 22–32)
Calcium: 9.2 mg/dL (ref 8.9–10.3)
Chloride: 95 mmol/L — ABNORMAL LOW (ref 101–111)
Creatinine, Ser: 0.65 mg/dL (ref 0.44–1.00)
GFR calc Af Amer: >60 mL/min (ref >60)
GFR calc non Af Amer: >60 mL/min (ref >60)
Glucose, Bld: 156 mg/dL — ABNORMAL HIGH (ref 65–99)
Potassium: 3.6 mmol/L (ref 3.5–5.1)
Sodium: 135 mmol/L (ref 135–145)
Total Bilirubin: 0.3 mg/dL (ref 0.3–1.2)
Total Protein: 6.8 g/dL (ref 6.5–8.1)

## 2014-08-01 LAB — CBC WITH DIFFERENTIAL/PLATELET
Basophils Absolute: 0.1 10*3/uL (ref 0–0.1)
Basophils Relative: 1 %
Eosinophils Absolute: 0.2 10*3/uL (ref 0–0.7)
Eosinophils Relative: 2 %
HCT: 34.5 % — ABNORMAL LOW (ref 35.0–47.0)
Hemoglobin: 11.3 g/dL — ABNORMAL LOW (ref 12.0–16.0)
Lymphocytes Relative: 18 %
Lymphs Abs: 1.8 10*3/uL (ref 1.0–3.6)
MCH: 25.9 pg — ABNORMAL LOW (ref 26.0–34.0)
MCHC: 32.8 g/dL (ref 32.0–36.0)
MCV: 78.7 fL — ABNORMAL LOW (ref 80.0–100.0)
Monocytes Absolute: 0.8 10*3/uL (ref 0.2–0.9)
Monocytes Relative: 8 %
Neutro Abs: 7.1 10*3/uL — ABNORMAL HIGH (ref 1.4–6.5)
Neutrophils Relative %: 71 %
Platelets: 205 10*3/uL (ref 150–440)
RBC: 4.38 MIL/uL (ref 3.80–5.20)
RDW: 15.8 % — ABNORMAL HIGH (ref 11.5–14.5)
WBC: 10 10*3/uL (ref 3.6–11.0)

## 2014-08-01 LAB — LIPASE, BLOOD: Lipase: 28 U/L (ref 22–51)

## 2014-08-01 MED ORDER — IOHEXOL 350 MG/ML SOLN
100.0000 mL | Freq: Once | INTRAVENOUS | Status: AC | PRN
  Administered 2014-08-01: 100 mL via INTRAVENOUS
  Filled 2014-08-01: qty 100

## 2014-08-01 MED ORDER — HYDROCORTISONE 1 % EX OINT: 1.0000 "application " | TOPICAL_OINTMENT | Freq: Two times a day (BID) | CUTANEOUS | Status: DC

## 2014-08-01 MED ORDER — ONDANSETRON HCL 4 MG/2ML IJ SOLN
4.0000 mg | Freq: Once | INTRAMUSCULAR | Status: AC
  Administered 2014-08-01: 4 mg via INTRAVENOUS

## 2014-08-01 MED ORDER — ONDANSETRON HCL 4 MG/2ML IJ SOLN
INTRAMUSCULAR | Status: AC
  Administered 2014-08-01: 4 mg via INTRAVENOUS
  Filled 2014-08-01: qty 2

## 2014-08-01 MED ORDER — ONDANSETRON HCL 4 MG PO TABS: 4.0000 mg | ORAL_TABLET | Freq: Three times a day (TID) | ORAL | Status: DC | PRN

## 2014-08-01 MED ORDER — IOHEXOL 240 MG/ML SOLN
25.0000 mL | Freq: Once | INTRAMUSCULAR | Status: AC | PRN
  Administered 2014-08-01: 50 mL via ORAL
  Filled 2014-08-01: qty 25

## 2014-08-01 NOTE — ED Notes (Signed)
Pt up to bedside commode

## 2014-08-01 NOTE — Discharge Instructions (Signed)

## 2014-08-01 NOTE — ED Notes (Signed)
Pt to triage via [facility] with c/o rash to fingers; pt reports taking aleve earlier today. Pt also reports nausea, pain to right abdomen (lump).

## 2014-08-01 NOTE — ED Provider Notes (Signed)
[facility] Emergency Department Provider Note  ____________________________________________  Time seen: Approximately 7:23 PM  I have reviewed the triage vital signs and the nursing notes.   HISTORY  Chief Complaint Rash and Abdominal Pain   HPI Cristina [name] is a 54 y.o. female who presents to the emergency department for headache complaint of abdominal pain and a rash to her right hand. She states that she noticed the rash after picking her shorts up off the floorand seeing a spider crawl out from underneath him. She states she picked them up with her left hand, but the rash is on her right hand. She states the right hand itches. The pain in her stomach is in the right upper portion and has been present for several months off and on. She's denies discussing this with her primary care provider because she didn't think it was anything to worry about. She states that today the pain returned and she has had 5 episodes of diarrhea. She denies nausea or vomiting.   Past Medical History  Diagnosis Date  . Asthma   . CHF (congestive heart failure)   . Diabetes mellitus without complication   . History of cholecystectomy     hx of cholecystectomy  . H/O tubal ligation     hx btl    There are no active problems to display for this patient.   No past surgical history on file.  Current Outpatient Rx  Name  Route  Sig  Dispense  Refill  . hydrocortisone 1 % ointment   Topical   Apply 1 application topically 2 (two) times daily.   30 g   0   . ondansetron (ZOFRAN) 4 MG tablet   Oral   Take 1 tablet (4 mg total) by mouth every 8 (eight) hours as needed for nausea or vomiting.   12 tablet   0     Allergies Codeine  No family history on file.  Social History History  Substance Use Topics  . Smoking status: Not on file  . Smokeless tobacco: Not on file  . Alcohol Use: Not on file    Review of Systems Constitutional: No fever/chills Eyes: No  visual changes. ENT: No sore throat. Cardiovascular: Denies chest pain. Respiratory: Denies shortness of breath. Gastrointestinal: Positive for abdominal pain.  No nausea, no vomiting. Positive for diarrhea.  No constipation. Genitourinary: Negative for dysuria. Musculoskeletal: Negative for back pain. Skin: Positive for rash on right dorsal hand. Neurological: Negative for headaches, focal weakness or numbness.  10-point ROS otherwise negative.  ____________________________________________   PHYSICAL EXAM:  VITAL SIGNS: ED Triage Vitals  Enc Vitals Group     BP --      Pulse --      Resp --      Temp --      Temp src --      SpO2 --      Weight --      Height --      Head Cir --      Peak Flow --      Pain Score 08/01/14 1811 10     Pain Loc --      Pain Edu? --      Excl. in [place]? --     Constitutional: Alert and oriented. Well appearing and in no acute distress. Eyes: Conjunctivae are normal. PERRL. EOMI. Head: Atraumatic. Nose: No congestion/rhinnorhea. Mouth/Throat: Mucous membranes are moist.  Oropharynx non-erythematous. Neck: No stridor.   Cardiovascular: Normal rate,  regular rhythm. Grossly normal heart sounds.  Good peripheral circulation. Respiratory: Normal respiratory effort.  No retractions. Lungs CTAB. Gastrointestinal: Patient's abdomen is rounded and firm at baseline. There is a palpable mass on the right upper abdomen. Musculoskeletal: Nonpitting bilateral lower extremity edema present..  No joint effusions. Neurologic:  Normal speech and language. No gross focal neurologic deficits are appreciated. Speech is normal. No gait instability. Skin:  Skin is warm, dry and intact. No rash noted. No evidence of infection or cellulitis. Skin on dorsal aspect of right hand appears to be dry and excoriated. No evidence of allergic reaction or insect bite. Psychiatric: Mood and affect are normal. Speech and behavior are  normal.  ____________________________________________   LABS (all labs ordered are listed, but only abnormal results are displayed)  Labs Reviewed  CBC WITH DIFFERENTIAL/PLATELET - Abnormal; Notable for the following:    Hemoglobin 11.3 (*)    HCT 34.5 (*)    MCV 78.7 (*)    MCH 25.9 (*)    RDW 15.8 (*)    Neutro Abs 7.1 (*)    All other components within normal limits  COMPREHENSIVE METABOLIC PANEL - Abnormal; Notable for the following:    Chloride 95 (*)    Glucose, Bld 156 (*)    All other components within normal limits  URINALYSIS COMPLETEWITH MICROSCOPIC ([facility] ONLY) - Abnormal; Notable for the following:    Color, Urine STRAW (*)    APPearance CLEAR (*)    Specific Gravity, Urine 1.003 (*)    Leukocytes, UA TRACE (*)    Bacteria, UA FEW (*)    Squamous Epithelial / LPF 0-5 (*)    All other components within normal limits  LIPASE, BLOOD   ____________________________________________  EKG   ____________________________________________  RADIOLOGY  Moderate stool in the colon otherwise normal CT scan of the abdomen and pelvis. ____________________________________________   PROCEDURES  Procedure(s) performed: None  Critical Care performed: No  ____________________________________________   INITIAL IMPRESSION / ASSESSMENT AND PLAN / ED COURSE  Pertinent labs & imaging results that were available during my care of the patient were reviewed by me and considered in my medical decision making (see chart for details).  Patient was advised to follow-up with her primary care provider. She was advised to return for symptoms that change or worsen if she is unable schedule an appointment. ____________________________________________   FINAL CLINICAL IMPRESSION(S) / ED DIAGNOSES  Final diagnoses:  Mild atopic dermatitis  Abdominal pain in female patient      Victorino Dike, FNP 08/01/14 2137  Harvest Dark, MD 08/01/14 2306

## 2014-09-23 ENCOUNTER — Emergency Department: Payer: Medicare Other

## 2014-09-23 ENCOUNTER — Observation Stay
Admission: EM | Admit: 2014-09-23 | Discharge: 2014-09-24 | Disposition: A | Discharge status: Home / Self Care | Payer: Medicare Other | Attending: Internal Medicine | Admitting: Internal Medicine

## 2014-09-23 ENCOUNTER — Encounter: Payer: Self-pay | Admitting: Emergency Medicine

## 2014-09-23 ENCOUNTER — Observation Stay
Admit: 2014-09-23 | Discharge: 2014-09-23 | Disposition: A | Discharge status: Home / Self Care | Payer: Medicare Other | Attending: Cardiovascular Disease | Admitting: Cardiovascular Disease

## 2014-09-23 DIAGNOSIS — Z7982 Long term (current) use of aspirin: Secondary | ICD-10-CM | POA: Insufficient documentation

## 2014-09-23 DIAGNOSIS — Z8673 Personal history of transient ischemic attack (TIA), and cerebral infarction without residual deficits: Secondary | ICD-10-CM | POA: Diagnosis not present

## 2014-09-23 DIAGNOSIS — I340 Nonrheumatic mitral (valve) insufficiency: Secondary | ICD-10-CM | POA: Diagnosis not present

## 2014-09-23 DIAGNOSIS — Z6841 Body Mass Index (BMI) 40.0 and over, adult: Secondary | ICD-10-CM | POA: Insufficient documentation

## 2014-09-23 DIAGNOSIS — Z9049 Acquired absence of other specified parts of digestive tract: Secondary | ICD-10-CM | POA: Diagnosis not present

## 2014-09-23 DIAGNOSIS — Z7951 Long term (current) use of inhaled steroids: Secondary | ICD-10-CM | POA: Insufficient documentation

## 2014-09-23 DIAGNOSIS — Z8249 Family history of ischemic heart disease and other diseases of the circulatory system: Secondary | ICD-10-CM | POA: Diagnosis not present

## 2014-09-23 DIAGNOSIS — Z9851 Tubal ligation status: Secondary | ICD-10-CM | POA: Insufficient documentation

## 2014-09-23 DIAGNOSIS — R0789 Other chest pain: Principal | ICD-10-CM | POA: Insufficient documentation

## 2014-09-23 DIAGNOSIS — Z794 Long term (current) use of insulin: Secondary | ICD-10-CM | POA: Diagnosis not present

## 2014-09-23 DIAGNOSIS — J45909 Unspecified asthma, uncomplicated: Secondary | ICD-10-CM | POA: Insufficient documentation

## 2014-09-23 DIAGNOSIS — Z885 Allergy status to narcotic agent status: Secondary | ICD-10-CM | POA: Diagnosis not present

## 2014-09-23 DIAGNOSIS — I071 Rheumatic tricuspid insufficiency: Secondary | ICD-10-CM | POA: Insufficient documentation

## 2014-09-23 DIAGNOSIS — E119 Type 2 diabetes mellitus without complications: Secondary | ICD-10-CM | POA: Insufficient documentation

## 2014-09-23 DIAGNOSIS — G4730 Sleep apnea, unspecified: Secondary | ICD-10-CM | POA: Diagnosis not present

## 2014-09-23 DIAGNOSIS — E785 Hyperlipidemia, unspecified: Secondary | ICD-10-CM | POA: Insufficient documentation

## 2014-09-23 DIAGNOSIS — I10 Essential (primary) hypertension: Secondary | ICD-10-CM | POA: Insufficient documentation

## 2014-09-23 DIAGNOSIS — R0602 Shortness of breath: Secondary | ICD-10-CM | POA: Diagnosis not present

## 2014-09-23 DIAGNOSIS — I25110 Atherosclerotic heart disease of native coronary artery with unstable angina pectoris: Secondary | ICD-10-CM | POA: Diagnosis not present

## 2014-09-23 DIAGNOSIS — Z79899 Other long term (current) drug therapy: Secondary | ICD-10-CM | POA: Insufficient documentation

## 2014-09-23 DIAGNOSIS — Z87891 Personal history of nicotine dependence: Secondary | ICD-10-CM | POA: Diagnosis not present

## 2014-09-23 DIAGNOSIS — R079 Chest pain, unspecified: Secondary | ICD-10-CM

## 2014-09-23 HISTORY — DX: Essential (primary) hypertension: I10

## 2014-09-23 HISTORY — DX: Unspecified abdominal hernia without obstruction or gangrene: K46.9

## 2014-09-23 HISTORY — DX: Cerebral infarction, unspecified: I63.9

## 2014-09-23 HISTORY — DX: Sleep apnea, unspecified: G47.30

## 2014-09-23 HISTORY — DX: Benign lipomatous neoplasm, unspecified: D17.9

## 2014-09-23 LAB — BASIC METABOLIC PANEL
Anion gap: 12 (ref 5–15)
BUN: 12 mg/dL (ref 6–20)
CO2: 29 mmol/L (ref 22–32)
Calcium: 9.2 mg/dL (ref 8.9–10.3)
Chloride: 97 mmol/L — ABNORMAL LOW (ref 101–111)
Creatinine, Ser: 0.61 mg/dL (ref 0.44–1.00)
GFR calc Af Amer: >60 mL/min (ref >60)
GFR calc non Af Amer: >60 mL/min (ref >60)
Glucose, Bld: 128 mg/dL — ABNORMAL HIGH (ref 65–99)
Potassium: 3.5 mmol/L (ref 3.5–5.1)
Sodium: 138 mmol/L (ref 135–145)

## 2014-09-23 LAB — CBC
HCT: 34.9 % — ABNORMAL LOW (ref 35.0–47.0)
Hemoglobin: 11.4 g/dL — ABNORMAL LOW (ref 12.0–16.0)
MCH: 25.1 pg — ABNORMAL LOW (ref 26.0–34.0)
MCHC: 32.8 g/dL (ref 32.0–36.0)
MCV: 76.4 fL — ABNORMAL LOW (ref 80.0–100.0)
Platelets: 238 10*3/uL (ref 150–440)
RBC: 4.57 MIL/uL (ref 3.80–5.20)
RDW: 16.6 % — ABNORMAL HIGH (ref 11.5–14.5)
WBC: 10.5 10*3/uL (ref 3.6–11.0)

## 2014-09-23 LAB — TROPONIN I: Troponin I: <0.03 ng/mL (ref <0.031)

## 2014-09-23 LAB — FIBRIN DERIVATIVES D-DIMER (ARMC ONLY): Fibrin derivatives D-dimer (ARMC): 373 (ref 0–499)

## 2014-09-23 LAB — TSH: TSH: 2.991 u[IU]/mL (ref 0.350–4.500)

## 2014-09-23 LAB — GLUCOSE, CAPILLARY: Glucose-Capillary: 218 mg/dL — ABNORMAL HIGH (ref 65–99)

## 2014-09-23 MED ORDER — INSULIN DETEMIR 100 UNIT/ML FLEXPEN: 50.0000 [IU] | PEN_INJECTOR | SUBCUTANEOUS | Status: DC

## 2014-09-23 MED ORDER — LINAGLIPTIN 5 MG PO TABS: 5.0000 mg | ORAL_TABLET | Freq: Every day | ORAL | Status: DC

## 2014-09-23 MED ORDER — SODIUM CHLORIDE 0.9 % WEIGHT BASED INFUSION: 1.0000 mL/kg/h | INTRAVENOUS | Status: DC

## 2014-09-23 MED ORDER — ACETAMINOPHEN 325 MG PO TABS: 650.0000 mg | ORAL_TABLET | Freq: Four times a day (QID) | ORAL | Status: DC | PRN

## 2014-09-23 MED ORDER — TIOTROPIUM BROMIDE MONOHYDRATE 18 MCG IN CAPS
18.0000 ug | ORAL_CAPSULE | Freq: Every day | RESPIRATORY_TRACT | Status: DC
  Filled 2014-09-23: qty 5

## 2014-09-23 MED ORDER — SODIUM CHLORIDE 0.9 % IJ SOLN: 3.0000 mL | INTRAMUSCULAR | Status: DC | PRN

## 2014-09-23 MED ORDER — KETOCONAZOLE 2 % EX SHAM
1.0000 "application " | MEDICATED_SHAMPOO | CUTANEOUS | Status: DC
  Filled 2014-09-23: qty 120

## 2014-09-23 MED ORDER — FUROSEMIDE 40 MG PO TABS: 80.0000 mg | ORAL_TABLET | ORAL | Status: DC

## 2014-09-23 MED ORDER — ATORVASTATIN CALCIUM 20 MG PO TABS
40.0000 mg | ORAL_TABLET | Freq: Every day | ORAL | Status: DC
  Administered 2014-09-23: 40 mg via ORAL
  Filled 2014-09-23: qty 2

## 2014-09-23 MED ORDER — INSULIN DETEMIR 100 UNIT/ML ~~LOC~~ SOLN
52.0000 [IU] | Freq: Every day | SUBCUTANEOUS | Status: DC
  Administered 2014-09-23: 52 [IU] via SUBCUTANEOUS
  Filled 2014-09-23 (×2): qty 0.52

## 2014-09-23 MED ORDER — SODIUM CHLORIDE 0.9 % IV SOLN
INTRAVENOUS | Status: DC
  Administered 2014-09-23: 23:00:00 via INTRAVENOUS

## 2014-09-23 MED ORDER — METOPROLOL TARTRATE 50 MG PO TABS
50.0000 mg | ORAL_TABLET | Freq: Two times a day (BID) | ORAL | Status: DC
  Administered 2014-09-23: 50 mg via ORAL
  Filled 2014-09-23: qty 1

## 2014-09-23 MED ORDER — MIRTAZAPINE 30 MG PO TABS
45.0000 mg | ORAL_TABLET | Freq: Every day | ORAL | Status: DC
  Administered 2014-09-23: 45 mg via ORAL
  Filled 2014-09-23: qty 1

## 2014-09-23 MED ORDER — SODIUM CHLORIDE 0.9 % IJ SOLN
3.0000 mL | Freq: Two times a day (BID) | INTRAMUSCULAR | Status: DC
  Administered 2014-09-23: 3 mL via INTRAVENOUS

## 2014-09-23 MED ORDER — INSULIN DETEMIR 100 UNIT/ML ~~LOC~~ SOLN
50.0000 [IU] | SUBCUTANEOUS | Status: DC
  Filled 2014-09-23 (×2): qty 0.5

## 2014-09-23 MED ORDER — ACETAMINOPHEN 650 MG RE SUPP: 650.0000 mg | Freq: Four times a day (QID) | RECTAL | Status: DC | PRN

## 2014-09-23 MED ORDER — SODIUM CHLORIDE 0.9 % IV SOLN: 250.0000 mL | INTRAVENOUS | Status: DC | PRN

## 2014-09-23 MED ORDER — SODIUM CHLORIDE 0.9 % WEIGHT BASED INFUSION: 3.0000 mL/kg/h | INTRAVENOUS | Status: DC

## 2014-09-23 MED ORDER — CLONAZEPAM 0.5 MG PO TABS
0.5000 mg | ORAL_TABLET | Freq: Two times a day (BID) | ORAL | Status: DC
  Administered 2014-09-23: 0.5 mg via ORAL
  Filled 2014-09-23: qty 1

## 2014-09-23 MED ORDER — PREGABALIN 50 MG PO CAPS
100.0000 mg | ORAL_CAPSULE | Freq: Three times a day (TID) | ORAL | Status: DC
  Administered 2014-09-23: 100 mg via ORAL
  Filled 2014-09-23: qty 2

## 2014-09-23 MED ORDER — ALBUTEROL SULFATE (2.5 MG/3ML) 0.083% IN NEBU
2.5000 mg | INHALATION_SOLUTION | Freq: Three times a day (TID) | RESPIRATORY_TRACT | Status: DC
  Filled 2014-09-23: qty 3

## 2014-09-23 MED ORDER — ALBUTEROL SULFATE HFA 108 (90 BASE) MCG/ACT IN AERS: 2.0000 | INHALATION_SPRAY | Freq: Three times a day (TID) | RESPIRATORY_TRACT | Status: DC

## 2014-09-23 MED ORDER — CYCLOBENZAPRINE HCL 10 MG PO TABS
10.0000 mg | ORAL_TABLET | Freq: Two times a day (BID) | ORAL | Status: DC
  Administered 2014-09-23: 10 mg via ORAL
  Filled 2014-09-23: qty 1

## 2014-09-23 MED ORDER — INSULIN ASPART 100 UNIT/ML ~~LOC~~ SOLN: 0.0000 [IU] | Freq: Three times a day (TID) | SUBCUTANEOUS | Status: DC

## 2014-09-23 MED ORDER — DIPHENOXYLATE-ATROPINE 2.5-0.025 MG PO TABS: 1.0000 | ORAL_TABLET | Freq: Every day | ORAL | Status: DC

## 2014-09-23 MED ORDER — MOMETASONE FURO-FORMOTEROL FUM 100-5 MCG/ACT IN AERO
2.0000 | INHALATION_SPRAY | Freq: Two times a day (BID) | RESPIRATORY_TRACT | Status: DC
  Administered 2014-09-23: 2 via RESPIRATORY_TRACT
  Filled 2014-09-23: qty 8.8

## 2014-09-23 MED ORDER — PANTOPRAZOLE SODIUM 40 MG PO TBEC
40.0000 mg | DELAYED_RELEASE_TABLET | Freq: Every day | ORAL | Status: DC
  Administered 2014-09-23: 40 mg via ORAL
  Filled 2014-09-23: qty 1

## 2014-09-23 MED ORDER — ASPIRIN 81 MG PO CHEW
81.0000 mg | CHEWABLE_TABLET | ORAL | Status: AC
  Administered 2014-09-24: 81 mg via ORAL
  Filled 2014-09-23: qty 1

## 2014-09-23 MED ORDER — HEPARIN SODIUM (PORCINE) 5000 UNIT/ML IJ SOLN
5000.0000 [IU] | Freq: Three times a day (TID) | INTRAMUSCULAR | Status: DC
Start: 2014-09-23 — End: 2014-09-24
  Administered 2014-09-23 – 2014-09-24 (×2): 5000 [IU] via SUBCUTANEOUS
  Filled 2014-09-23 (×2): qty 1

## 2014-09-23 MED ORDER — METFORMIN HCL ER 500 MG PO TB24: 1000.0000 mg | ORAL_TABLET | Freq: Two times a day (BID) | ORAL | Status: DC

## 2014-09-23 MED ORDER — ASPIRIN 81 MG PO CHEW
324.0000 mg | CHEWABLE_TABLET | Freq: Once | ORAL | Status: AC
  Administered 2014-09-23: 324 mg via ORAL
  Filled 2014-09-23: qty 4

## 2014-09-23 MED ORDER — NITROGLYCERIN 0.4 MG SL SUBL
0.4000 mg | SUBLINGUAL_TABLET | SUBLINGUAL | Status: DC | PRN
  Administered 2014-09-23 (×2): 0.4 mg via SUBLINGUAL
  Filled 2014-09-23: qty 1

## 2014-09-23 MED ORDER — MELOXICAM 7.5 MG PO TABS
7.5000 mg | ORAL_TABLET | Freq: Two times a day (BID) | ORAL | Status: DC
  Administered 2014-09-23: 7.5 mg via ORAL
  Filled 2014-09-23: qty 1

## 2014-09-23 MED ORDER — ASPIRIN EC 325 MG PO TBEC: 325.0000 mg | DELAYED_RELEASE_TABLET | Freq: Every day | ORAL | Status: DC

## 2014-09-23 MED ORDER — QUINAPRIL HCL 10 MG PO TABS
20.0000 mg | ORAL_TABLET | Freq: Every day | ORAL | Status: DC
  Filled 2014-09-23 (×2): qty 2

## 2014-09-23 NOTE — H&P (Signed)
[place] [facility] [place] NAME: Cristina [name]    MR#:  [id number]  DATE OF BIRTH:  March 13, 1960  DATE OF ADMISSION:  09/23/2014  PRIMARY CARE PHYSICIAN: Denton Lank, MD   REQUESTING/REFERRING Chipper Herb M.D   CHIEF COMPLAINT:   Chief Complaint  Patient presents with  . Chest Pain    HISTORY OF PRESENT ILLNESS: Cristina [name]  is a 54 y.o. female with a known history of  Asthma, CHF, diabetes type 2, hypertension, history of CVA, sleep apnea and hyperlipidemia presents to the hospital complaining of having chest pain ongoing for the past 1 month. Which is progressively gotten worse. She reports that the pain is left-sided and sharp in nature and lasts about 30 minutes. She also has associated shortness of breath and diaphoresis with the pain. She also has chronic shortness of breath and is unable to lay flat at nighttime. PAST MEDICAL HISTORY:   Past Medical History  Diagnosis Date  . Asthma   . CHF (congestive heart failure)   . Diabetes mellitus without complication   . History of cholecystectomy     hx of cholecystectomy  . H/O tubal ligation     hx btl  . Hypertension   . Stroke   . Abdominal hernia   . Fatty tumor   . Sleep apnea   . Abdominal hernia     PAST SURGICAL HISTORY:  Past Surgical History  Procedure Laterality Date  . Cholecystectomy    . Tubal ligation      SOCIAL HISTORY:  History  Substance Use Topics  . Smoking status: Former Research scientist (life sciences)  . Smokeless tobacco: Not on file  . Alcohol Use: No    FAMILY HISTORY:  Family History  Problem Relation Age of Onset  . CAD Mother     DRUG ALLERGIES:  Allergies  Allergen Reactions  . Codeine     ams    REVIEW OF SYSTEMS:   CONSTITUTIONAL: No fever, fatigue or weakness.  EYES: No blurred or double vision.  EARS, NOSE, AND THROAT: No tinnitus or ear pain.  RESPIRATORY: No cough, shortness of breath, wheezing or hemoptysis.  CARDIOVASCULAR:  Positive chest pain, orthopnea, positive edema.  GASTROINTESTINAL: No nausea, vomiting, diarrhea or abdominal pain.  GENITOURINARY: No dysuria, hematuria.  ENDOCRINE: No polyuria, nocturia,  HEMATOLOGY: No anemia, easy bruising or bleeding SKIN: No rash or lesion. MUSCULOSKELETAL: No joint pain or arthritis.   NEUROLOGIC: No tingling, numbness, weakness.  PSYCHIATRY: Positive anxiety and depression.   Home meds:  Patient currently doesn't know her medications were in the process of obtaining her accurate list. From her pharmacy     PHYSICAL EXAMINATION:   VITAL SIGNS: Blood pressure 109/53, pulse 97, temperature 98.4 F (36.9 C), temperature source Oral, resp. rate 14, height 4\' 11"  (1.499 m), weight 112.492 kg (248 lb), SpO2 98 %.  GENERAL:  54 y.o.-year-old patient lying in the bed with no acute distress.  EYES: Pupils equal, round, reactive to light and accommodation. No scleral icterus. Extraocular muscles intact.  HEENT: Head atraumatic, normocephalic. Oropharynx and nasopharynx clear.  NECK:  Supple, no jugular venous distention. No thyroid enlargement, no tenderness.  LUNGS: Normal breath sounds bilaterally, no wheezing, rales,rhonchi or crepitation. No use of accessory muscles of respiration.  CARDIOVASCULAR: S1, S2 normal. No murmurs, rubs, or gallops.  ABDOMEN: Soft, nontender, nondistended. Bowel sounds present. No organomegaly or mass.  EXTREMITIES: No pedal edema, cyanosis, or clubbing.  NEUROLOGIC: Cranial nerves II through XII  are intact. Muscle strength 5/5 in all extremities. Sensation intact. Gait not checked.  PSYCHIATRIC: The patient is alert and oriented x 3.  SKIN: No obvious rash, lesion, or ulcer.   LABORATORY PANEL:   CBC  Recent Labs Lab 09/23/14 1334  WBC 10.5  HGB 11.4*  HCT 34.9*  PLT 238  MCV 76.4*  MCH 25.1*  MCHC 32.8  RDW 16.6*    ------------------------------------------------------------------------------------------------------------------  Chemistries   Recent Labs Lab 09/23/14 1334  NA 138  K 3.5  CL 97*  CO2 29  GLUCOSE 128*  BUN 12  CREATININE 0.61  CALCIUM 9.2   ------------------------------------------------------------------------------------------------------------------ estimated creatinine clearance is 87.9 mL/min (by C-G formula based on Cr of 0.61). ------------------------------------------------------------------------------------------------------------------ No results for input(s): TSH, T4TOTAL, T3FREE, THYROIDAB in the last 72 hours.  Invalid input(s): FREET3   Coagulation profile No results for input(s): INR, PROTIME in the last 168 hours. ------------------------------------------------------------------------------------------------------------------- No results for input(s): DDIMER in the last 72 hours. -------------------------------------------------------------------------------------------------------------------  Cardiac Enzymes  Recent Labs Lab 09/23/14 1334  TROPONINI <0.03   ------------------------------------------------------------------------------------------------------------------ Invalid input(s): POCBNP  ---------------------------------------------------------------------------------------------------------------  Urinalysis    Component Value Date/Time   COLORURINE STRAW* 08/01/2014 1944   COLORURINE Yellow 02/16/2014 1145   APPEARANCEUR CLEAR* 08/01/2014 1944   APPEARANCEUR Turbid 02/16/2014 1145   LABSPEC 1.003* 08/01/2014 1944   LABSPEC 1.012 02/16/2014 1145   PHURINE 6.0 08/01/2014 1944   PHURINE 6.0 02/16/2014 1145   GLUCOSEU NEGATIVE 08/01/2014 1944   GLUCOSEU Negative 02/16/2014 1145   HGBUR NEGATIVE 08/01/2014 1944   HGBUR 2+ 02/16/2014 1145   BILIRUBINUR NEGATIVE 08/01/2014 1944   BILIRUBINUR Negative 02/16/2014 [place] 08/01/2014 1944   KETONESUR 1+ 02/16/2014 1145   PROTEINUR NEGATIVE 08/01/2014 1944   PROTEINUR 100 mg/dL 02/16/2014 1145   NITRITE NEGATIVE 08/01/2014 1944   NITRITE Positive 02/16/2014 1145   LEUKOCYTESUR TRACE* 08/01/2014 1944   LEUKOCYTESUR 3+ 02/16/2014 1145     RADIOLOGY: Dg Chest 2 View  09/23/2014   CLINICAL DATA:  New onset chest pain today, fluid retention per patient, LEFT shoulder and chest pain, history coronary disease post MI, CHF, diabetes mellitus  EXAM: CHEST  2 VIEW  COMPARISON:  02/16/2014  FINDINGS: Enlargement of cardiac silhouette.  Mediastinal contours and pulmonary vascularity normal.  Lungs clear.  No pleural effusion or pneumothorax.  Bones unremarkable.  IMPRESSION: Enlargement of cardiac silhouette.  No acute abnormalities.   Electronically Signed   By: Lavonia Dana M.D.   On: 09/23/2014 14:12    EKG: Orders placed or performed during the hospital encounter of 09/23/14  . ED EKG within 10 minutes  . ED EKG within 10 minutes    IMPRESSION AND PLAN: Patient is a 54 year old white female with history hypertension, diabetes CHF and sleep apnea presents with chest pain  1. Chest pain: Suspect cardiac in nature, patient has multiple risk factors. I have discussed the case with Dr. Dina Rich cardiology who will evaluate the patient for further evaluation Place her on aspirin.   2. Diabetes type 2: Resolved patient is home medications Place on sliding scale insulin and check a hemoglobin A1c  3. Hypertension resume blood pressure medications when list available  4. Sleep apnea continue CPAP  5. Hyperlipidemia lipid panel in the a.m.  6. Miscellaneous heparin for DVT prophylaxis    All the records are reviewed and case discussed with ED provider. Management plans discussed with the patient, family and they are in agreement.  CODE STATUS:Full    TOTAL TIME  TAKING CARE OF THIS PATIENT: 43min     Cristina [name] M.D on 09/23/2014 at 5:38  PM  Between 7am to 6pm - Pager - [phone]  After 6pm go to [email or web address] - password [email or web address] [place] [facility]  Office  [phone]  CC: Primary care physician; Denton Lank, MD

## 2014-09-23 NOTE — ED Notes (Signed)
Patient presents to the ED with left sided chest pain with shoulder pain and bilateral hand numbness.  Patient has an extensive history including previous MI, heart failure, and HTN.  Patient reports history of stroke as well.  Patient reports taking medication regularly as prescribed.  Patient reports chest pain is worse with breathing.

## 2014-09-23 NOTE — Progress Notes (Signed)
Cristina [name] is a 54 y.o. female  [id number]  Primary Cardiologist: Neoma Laming Reason for Consultation: Chest pain and unstable angina  HPI: This is a 54 year old white female with a past medical history of non-insulin-dependent diabetes hypertension hyperlipidemia history of CVA sleep apnea CHF and obesity, presented to the emergency room with chest pain associated with shortness of breath. Patient states chest pain is at rest associated with diaphoresis and radiation to the left arm. Chest pain been getting worse for the past month or so.   Review of Systems: Review of Systems  Respiratory: Positive for shortness of breath.   Cardiovascular: Positive for chest pain and PND.  All other systems reviewed and are negative.     Past Medical History  Diagnosis Date  . Asthma   . CHF (congestive heart failure)   . Diabetes mellitus without complication   . History of cholecystectomy     hx of cholecystectomy  . H/O tubal ligation     hx btl  . Hypertension   . Stroke   . Abdominal hernia   . Fatty tumor   . Sleep apnea   . Abdominal hernia     Medications Prior to Admission  Medication Sig Dispense Refill  . ADVAIR DISKUS 250-50 MCG/DOSE AEPB Inhale 2 puffs into the lungs daily.    Marland Kitchen atorvastatin (LIPITOR) 20 MG tablet Take 40 mg by mouth at bedtime.    . clonazePAM (KLONOPIN) 0.5 MG tablet Take 0.5 mg by mouth 2 (two) times daily.    . cyclobenzaprine (FLEXERIL) 10 MG tablet Take 10 mg by mouth 2 (two) times daily.    . diphenoxylate-atropine (LOMOTIL) 2.5-0.025 MG per tablet Take 1 tablet by mouth daily.    Marland Kitchen esomeprazole (NEXIUM) 40 MG capsule Take 40 mg by mouth daily.    . furosemide (LASIX) 40 MG tablet Take 80 mg by mouth every morning.    . haloperidol decanoate (HALDOL DECANOATE) 50 MG/ML injection Inject 1 mL into the muscle every 30 (thirty) days.    . hydrocortisone 2.5 % cream Apply 1 application topically daily as needed. For itching of hands.    Marland Kitchen  ketoconazole (NIZORAL) 2 % shampoo Apply 1 application topically every other day. Apply to scalp.    Marland Kitchen LEVEMIR FLEXTOUCH 100 UNIT/ML Pen Inject 50-52 Units into the skin See admin instructions. 50 units subcutaneous once a day in the morning and 52 units subcutaneous once day once a day at bedtime.    . lidocaine (XYLOCAINE) 5 % ointment Apply 1 application topically daily as needed. For pain.    Marland Kitchen LYRICA 100 MG capsule Take 100 mg by mouth 3 (three) times daily.    . meloxicam (MOBIC) 7.5 MG tablet Take 7.5 mg by mouth 2 (two) times daily.    . metFORMIN (GLUCOPHAGE-XR) 500 MG 24 hr tablet Take 1,000 mg by mouth 2 (two) times daily.    . metoprolol (LOPRESSOR) 50 MG tablet Take 50 mg by mouth 2 (two) times daily.    . mirtazapine (REMERON) 45 MG tablet Take 45 mg by mouth daily.    Marland Kitchen PROAIR HFA 108 (90 BASE) MCG/ACT inhaler Inhale 2 puffs into the lungs 3 (three) times daily.    . quinapril (ACCUPRIL) 20 MG tablet Take 20 mg by mouth daily.    Marland Kitchen SPIRIVA HANDIHALER 18 MCG inhalation capsule Place 18 mcg into inhaler and inhale daily.    . TRADJENTA 5 MG TABS tablet Take 5 mg by mouth daily.    Marland Kitchen  hydrocortisone 1 % ointment Apply 1 application topically 2 (two) times daily. 30 g 0  . ondansetron (ZOFRAN) 4 MG tablet Take 1 tablet (4 mg total) by mouth every 8 (eight) hours as needed for nausea or vomiting. 12 tablet 0     . albuterol  2.5 mg Nebulization TID  . aspirin EC  325 mg Oral Daily  . atorvastatin  40 mg Oral QHS  . clonazePAM  0.5 mg Oral BID  . cyclobenzaprine  10 mg Oral BID  . diphenoxylate-atropine  1 tablet Oral Daily  . [START ON 09/24/2014] furosemide  80 mg Oral BH-q7a  . heparin  5,000 Units Subcutaneous 3 times per day  . [START ON 09/24/2014] insulin aspart  0-9 Units Subcutaneous TID WC  . [START ON 09/24/2014] insulin detemir  50 Units Subcutaneous BH-q7a  . insulin detemir  52 Units Subcutaneous QHS  . ketoconazole  1 application Topical QODAY  . linagliptin  5 mg Oral  Daily  . meloxicam  7.5 mg Oral BID  . [START ON 09/24/2014] metFORMIN  1,000 mg Oral BID WC  . metoprolol  50 mg Oral BID  . mirtazapine  45 mg Oral Q2000  . mometasone-formoterol  2 puff Inhalation BID  . pantoprazole  40 mg Oral Daily  . pregabalin  100 mg Oral TID  . quinapril  20 mg Oral Daily  . [START ON 09/24/2014] tiotropium  18 mcg Inhalation Daily    Infusions: . sodium chloride      Allergies  Allergen Reactions  . Codeine     ams    History   Social History  . Marital Status: Single    Spouse Name: N/A  . Number of Children: N/A  . Years of Education: N/A   Occupational History  . Not on file.   Social History Main Topics  . Smoking status: Former Research scientist (life sciences)  . Smokeless tobacco: Not on file  . Alcohol Use: No  . Drug Use: No  . Sexual Activity: Not on file   Other Topics Concern  . Not on file   Social History Narrative    Family History  Problem Relation Age of Onset  . CAD Mother     PHYSICAL EXAM: Filed Vitals:   09/23/14 1959  BP: 140/74  Pulse: 96  Temp: 98.2 F (36.8 C)  Resp: 20    No intake or output data in the 24 hours ending 09/23/14 2014  General:  Well appearing. No respiratory difficulty HEENT: normal Neck: supple. no JVD. Carotids 2+ bilat; no bruits. No lymphadenopathy or thryomegaly appreciated. Cor: PMI nondisplaced. Regular rate & rhythm. No rubs, gallops or murmurs. Lungs: clear Abdomen: soft, nontender, nondistended. No hepatosplenomegaly. No bruits or masses. Good bowel sounds. Extremities: no cyanosis, clubbing, rash, edema Neuro: alert & oriented x 3, cranial nerves grossly intact. moves all 4 extremities w/o difficulty. Affect pleasant.  ECG: Sinus rhythm 98 bpm, incomplete right bundle branch block with old anteroseptal wall MI.  Results for orders placed or performed during the hospital encounter of 09/23/14 (from the past 24 hour(s))  Basic metabolic panel     Status: Abnormal   Collection Time: 09/23/14   1:34 PM  Result Value Ref Range   Sodium 138 135 - 145 mmol/L   Potassium 3.5 3.5 - 5.1 mmol/L   Chloride 97 (L) 101 - 111 mmol/L   CO2 29 22 - 32 mmol/L   Glucose, Bld 128 (H) 65 - 99 mg/dL   BUN 12 6 -  20 mg/dL   Creatinine, Ser 0.61 0.44 - 1.00 mg/dL   Calcium 9.2 8.9 - 10.3 mg/dL   GFR calc non Af Amer >60 >60 mL/min   GFR calc Af Amer >60 >60 mL/min   Anion gap 12 5 - 15  CBC     Status: Abnormal   Collection Time: 09/23/14  1:34 PM  Result Value Ref Range   WBC 10.5 3.6 - 11.0 K/uL   RBC 4.57 3.80 - 5.20 MIL/uL   Hemoglobin 11.4 (L) 12.0 - 16.0 g/dL   HCT 34.9 (L) 35.0 - 47.0 %   MCV 76.4 (L) 80.0 - 100.0 fL   MCH 25.1 (L) 26.0 - 34.0 pg   MCHC 32.8 32.0 - 36.0 g/dL   RDW 16.6 (H) 11.5 - 14.5 %   Platelets 238 150 - 440 K/uL  Troponin I     Status: None   Collection Time: 09/23/14  1:34 PM  Result Value Ref Range   Troponin I <0.03 <0.031 ng/mL  Fibrin derivatives D-Dimer ([facility] only)     Status: None   Collection Time: 09/23/14  1:34 PM  Result Value Ref Range   Fibrin derivatives D-dimer ([facility]) 373 0 - 499   Dg Chest 2 View  09/23/2014   CLINICAL DATA:  New onset chest pain today, fluid retention per patient, LEFT shoulder and chest pain, history coronary disease post MI, CHF, diabetes mellitus  EXAM: CHEST  2 VIEW  COMPARISON:  02/16/2014  FINDINGS: Enlargement of cardiac silhouette.  Mediastinal contours and pulmonary vascularity normal.  Lungs clear.  No pleural effusion or pneumothorax.  Bones unremarkable.  IMPRESSION: Enlargement of cardiac silhouette.  No acute abnormalities.   Electronically Signed   By: Lavonia Dana M.D.   On: 09/23/2014 14:12     ASSESSMENT AND PLAN: Unstable angina/acute coronary syndrome with chest pain at rest and multiple risk factors for coronary artery disease. Patient has hypertension non-insulin-dependent diabetes hyperlipidemia CVA sleep apnea and morbid obesity as risk factors for coronary artery disease. Advise cardiac catheterization  tomorrow.  Trinisha Paget A

## 2014-09-23 NOTE — ED Notes (Signed)
Patient states improvement with nitro. States pain is now 6/10.

## 2014-09-23 NOTE — Care Management Note (Signed)
Case Management Note  Patient Details  Name: Cristina [name] MRN: [id number] Date of Birth: [birth year]  Subjective/Objective:    Medicare observation letter delivered to patient in the ED, verbalized understanding, copy given, original placed on chart.                Action/Plan:   Expected Discharge Date:                  Expected Discharge Plan:     In-House Referral:     Discharge planning Services     Post Acute Care Choice:    Choice offered to:     DME Arranged:    DME Agency:     HH Arranged:    [place] Agency:     Status of Service:     Medicare Important Message Given:    Date Medicare IM Given:    Medicare IM give by:    Date Additional Medicare IM Given:    Additional Medicare Important Message give by:     If discussed at [place] of Stay Meetings, dates discussed:    Additional Comments:  Ival Bible, RN 09/23/2014, 6:07 PM

## 2014-09-23 NOTE — ED Notes (Signed)
Patient up to bathroom. Able to ambulate back to bed with minimal assistance. Patient states she has a sore throat. States chest pain off and on for last month. Came in today because she states her belly and legs are swelling.

## 2014-09-23 NOTE — ED Provider Notes (Signed)
[facility] Emergency Department Provider Note  ____________________________________________  Time seen: Approximately 245 PM  I have reviewed the triage vital signs and the nursing notes.   HISTORY  Chief Complaint Chest Pain    HPI Cristina [name] is a 54 y.o. female with a history of CHF, diabetes and MI who presents today with chest pain which is intermittent over the past month. She says that the chest pain is left-sided and sharp. It lasts for about 30 minutes. It is associated with hand numbness. No nausea or vomiting. Does say she has diaphoresis with the chest pain. She says she is having chest pressure at this time which is a 10 out of 10.Says that the chest pain is worse with deep breaths.   Past Medical History  Diagnosis Date  . Asthma   . CHF (congestive heart failure)   . Diabetes mellitus without complication   . History of cholecystectomy     hx of cholecystectomy  . H/O tubal ligation     hx btl  . Hypertension   . Stroke   . Abdominal hernia   . Fatty tumor     There are no active problems to display for this patient.   Past Surgical History  Procedure Laterality Date  . Cholecystectomy    . Tubal ligation      Current Outpatient Rx  Name  Route  Sig  Dispense  Refill  . hydrocortisone 1 % ointment   Topical   Apply 1 application topically 2 (two) times daily.   30 g   0   . ondansetron (ZOFRAN) 4 MG tablet   Oral   Take 1 tablet (4 mg total) by mouth every 8 (eight) hours as needed for nausea or vomiting.   12 tablet   0     Allergies Codeine  No family history on file.  Social History History  Substance Use Topics  . Smoking status: Never Smoker   . Smokeless tobacco: Not on file  . Alcohol Use: No    Review of Systems Constitutional: No fever/chills Eyes: No visual changes. ENT: No sore throat. Cardiovascular: As above Respiratory: Denies shortness of breath. Gastrointestinal: No abdominal pain.   No nausea, no vomiting.  No diarrhea.  No constipation. Genitourinary: Negative for dysuria. Musculoskeletal: Negative for back pain. Skin: Negative for rash. Neurological: Negative for headaches, focal weakness or numbness.  10-point ROS otherwise negative.  ____________________________________________   PHYSICAL EXAM:  VITAL SIGNS: ED Triage Vitals  Enc Vitals Group     BP 09/23/14 1324 112/69 mmHg     Pulse Rate 09/23/14 1324 97     Resp 09/23/14 1324 24     Temp 09/23/14 1324 98.4 F (36.9 C)     Temp Source 09/23/14 1324 Oral     SpO2 09/23/14 1324 98 %     Weight 09/23/14 1324 248 lb (112.492 kg)     Height 09/23/14 1324 4\' 11"  (1.499 m)     Head Cir --      Peak Flow --      Pain Score 09/23/14 1325 9     Pain Loc --      Pain Edu? --      Excl. in [place]? --     Constitutional: Alert and oriented. Well appearing and in no acute distress. Resting comfortably in the bed and asking for food. Eyes: Conjunctivae are normal. PERRL. EOMI. Head: Atraumatic. Nose: No congestion/rhinnorhea. Mouth/Throat: Mucous membranes are moist.  Oropharynx non-erythematous.  Neck: No stridor.   Cardiovascular: Normal rate, regular rhythm. Grossly normal heart sounds.  Good peripheral circulation. Respiratory: Normal respiratory effort.  No retractions. Lungs CTAB. Gastrointestinal: Soft and nontender. No distention. No abdominal bruits. No CVA tenderness. Musculoskeletal: No lower extremity tenderness nor edema.  No joint effusions. Neurologic:  Normal speech and language. No gross focal neurologic deficits are appreciated. No gait instability. Skin:  Skin is warm, dry and intact. No rash noted. Psychiatric: Mood and affect are normal. Speech and behavior are normal.  ____________________________________________   LABS (all labs ordered are listed, but only abnormal results are displayed)  Labs Reviewed  BASIC METABOLIC PANEL - Abnormal; Notable for the following:    Chloride 97 (*)     Glucose, Bld 128 (*)    All other components within normal limits  CBC - Abnormal; Notable for the following:    Hemoglobin 11.4 (*)    HCT 34.9 (*)    MCV 76.4 (*)    MCH 25.1 (*)    RDW 16.6 (*)    All other components within normal limits  TROPONIN I  FIBRIN DERIVATIVES D-DIMER ([facility] ONLY)   ____________________________________________  EKG  ED ECG REPORT I, Doran Stabler, the attending physician, personally viewed and interpreted this ECG.   Date: 09/23/2014  EKG Time: 1319  Rate: 98  Rhythm: normal sinus rhythm  Axis: Normal axis  Intervals:Incomplete right bundle-branch block.  ST&T Change: No ST elevations or depressions. No abnormal T-wave inversions. No change from EKG from 02/16/2014.  ____________________________________________  RADIOLOGY  Enlarged heart. No acute abnormalities. I personally reviewed these films. ____________________________________________   PROCEDURES    ____________________________________________   INITIAL IMPRESSION / ASSESSMENT AND PLAN / ED COURSE  Pertinent labs & imaging results that were available during my care of the patient were reviewed by me and considered in my medical decision making (see chart for details).  ----------------------------------------- 5:12 PM on 09/23/2014 -----------------------------------------  Patient says still with persistent chest pain although resting comfortably with no objective signs of outward pain. Says the only minimally relieved by nitroglycerin. Says will likely be unable to follow up with cardiology because of transportation issues. We will admit to the hospital. Discussed with Dr. Humphrey Rolls who will see the patient in the emergency department. Signed out to Dr. Doy Hutching. ____________________________________________   FINAL CLINICAL IMPRESSION(S) / ED DIAGNOSES  Acute chest pain. Initial visit.    Orbie Pyo, MD 09/23/14 [phone]

## 2014-09-23 NOTE — ED Notes (Addendum)
Patient states she is having 9/10 chest pain. Given one nitroglycerin SL. Reassessed vitals. Patient showing sinus tach on monitor (same as previous reading), SP02 at 94% on room air, resp 17 and HR at 108.

## 2014-09-24 ENCOUNTER — Encounter: Payer: Self-pay | Admitting: Cardiovascular Disease

## 2014-09-24 ENCOUNTER — Encounter
Admission: EM | Disposition: A | Discharge status: Home / Self Care | Payer: Self-pay | Source: Home / Self Care | Attending: Emergency Medicine

## 2014-09-24 DIAGNOSIS — R0789 Other chest pain: Secondary | ICD-10-CM | POA: Diagnosis not present

## 2014-09-24 HISTORY — PX: CARDIAC CATHETERIZATION: SHX172

## 2014-09-24 LAB — CBC
HCT: 33 % — ABNORMAL LOW (ref 35.0–47.0)
Hemoglobin: 10.9 g/dL — ABNORMAL LOW (ref 12.0–16.0)
MCH: 25.2 pg — ABNORMAL LOW (ref 26.0–34.0)
MCHC: 32.9 g/dL (ref 32.0–36.0)
MCV: 76.5 fL — ABNORMAL LOW (ref 80.0–100.0)
Platelets: 241 10*3/uL (ref 150–440)
RBC: 4.31 MIL/uL (ref 3.80–5.20)
RDW: 16.4 % — ABNORMAL HIGH (ref 11.5–14.5)
WBC: 8.8 10*3/uL (ref 3.6–11.0)

## 2014-09-24 LAB — LIPID PANEL
Cholesterol: 158 mg/dL (ref 0–200)
HDL: 38 mg/dL — ABNORMAL LOW (ref >40)
LDL Cholesterol: 68 mg/dL (ref 0–99)
Total CHOL/HDL Ratio: 4.2 RATIO
Triglycerides: 260 mg/dL — ABNORMAL HIGH (ref <150)
VLDL: 52 mg/dL — ABNORMAL HIGH (ref 0–40)

## 2014-09-24 LAB — BASIC METABOLIC PANEL
Anion gap: 8 (ref 5–15)
BUN: 12 mg/dL (ref 6–20)
CO2: 29 mmol/L (ref 22–32)
Calcium: 9.2 mg/dL (ref 8.9–10.3)
Chloride: 105 mmol/L (ref 101–111)
Creatinine, Ser: 0.63 mg/dL (ref 0.44–1.00)
GFR calc Af Amer: >60 mL/min (ref >60)
GFR calc non Af Amer: >60 mL/min (ref >60)
Glucose, Bld: 222 mg/dL — ABNORMAL HIGH (ref 65–99)
Potassium: 4.3 mmol/L (ref 3.5–5.1)
Sodium: 142 mmol/L (ref 135–145)

## 2014-09-24 LAB — PROTIME-INR
INR: 1.05
Prothrombin Time: 13.9 seconds (ref 11.4–15.0)

## 2014-09-24 LAB — GLUCOSE, CAPILLARY
Glucose-Capillary: 180 mg/dL — ABNORMAL HIGH (ref 65–99)
Glucose-Capillary: 183 mg/dL — ABNORMAL HIGH (ref 65–99)

## 2014-09-24 LAB — HEMOGLOBIN A1C: Hgb A1c MFr Bld: 8 % — ABNORMAL HIGH (ref 4.0–6.0)

## 2014-09-24 SURGERY — LEFT HEART CATH AND CORONARY ANGIOGRAPHY
Anesthesia: Moderate Sedation | Laterality: Left

## 2014-09-24 MED ORDER — FENTANYL CITRATE (PF) 100 MCG/2ML IJ SOLN
INTRAMUSCULAR | Status: AC
  Filled 2014-09-24: qty 2

## 2014-09-24 MED ORDER — MIDAZOLAM HCL 2 MG/2ML IJ SOLN
INTRAMUSCULAR | Status: AC
  Filled 2014-09-24: qty 2

## 2014-09-24 MED ORDER — SODIUM CHLORIDE 0.9 % IJ SOLN: 3.0000 mL | INTRAMUSCULAR | Status: DC | PRN

## 2014-09-24 MED ORDER — HEPARIN (PORCINE) IN NACL 2-0.9 UNIT/ML-% IJ SOLN
INTRAMUSCULAR | Status: AC
  Filled 2014-09-24: qty 1000

## 2014-09-24 MED ORDER — ONDANSETRON HCL 4 MG/2ML IJ SOLN: 4.0000 mg | Freq: Four times a day (QID) | INTRAMUSCULAR | Status: DC | PRN

## 2014-09-24 MED ORDER — ACETAMINOPHEN 325 MG PO TABS: 650.0000 mg | ORAL_TABLET | ORAL | Status: DC | PRN

## 2014-09-24 MED ORDER — FENTANYL CITRATE (PF) 100 MCG/2ML IJ SOLN
INTRAMUSCULAR | Status: DC | PRN
  Administered 2014-09-24 (×2): 50 ug via INTRAVENOUS

## 2014-09-24 MED ORDER — MIDAZOLAM HCL 2 MG/2ML IJ SOLN
INTRAMUSCULAR | Status: DC | PRN
  Administered 2014-09-24 (×2): 1 mg via INTRAVENOUS

## 2014-09-24 MED ORDER — SODIUM CHLORIDE 0.9 % IV SOLN: 250.0000 mL | INTRAVENOUS | Status: DC | PRN

## 2014-09-24 MED ORDER — IOHEXOL 300 MG/ML  SOLN
INTRAMUSCULAR | Status: DC | PRN
Start: 2014-09-24 — End: 2014-09-24
  Administered 2014-09-24: 130 mL via INTRA_ARTERIAL

## 2014-09-24 MED ORDER — SODIUM CHLORIDE 0.9 % WEIGHT BASED INFUSION: 1.0000 mL/kg/h | INTRAVENOUS | Status: DC

## 2014-09-24 MED ORDER — SODIUM CHLORIDE 0.9 % IJ SOLN: 3.0000 mL | Freq: Two times a day (BID) | INTRAMUSCULAR | Status: DC

## 2014-09-24 SURGICAL SUPPLY — 12 items
CATH AMP RT 5F (CATHETERS) ×2 IMPLANT
CATH INFINITI 5 FR 3DRC (CATHETERS) ×2 IMPLANT
CATH INFINITI 5 FR JR3.5 (CATHETERS) ×2 IMPLANT
CATH INFINITI 5FR ANG PIGTAIL (CATHETERS) ×2 IMPLANT
CATH INFINITI 5FR JL4 (CATHETERS) ×2 IMPLANT
CATH INFINITI JR4 5F (CATHETERS) ×2 IMPLANT
DEVICE CLOSURE MYNXGRIP 5F (Vascular Products) ×2 IMPLANT
KIT MANI 3VAL PERCEP (MISCELLANEOUS) ×2 IMPLANT
NEEDLE PERC 18GX7CM (NEEDLE) ×2 IMPLANT
PACK CARDIAC CATH (CUSTOM PROCEDURE TRAY) ×2 IMPLANT
SHEATH PINNACLE 5F 10CM (SHEATH) ×2 IMPLANT
WIRE EMERALD 3MM-J .035X150CM (WIRE) ×2 IMPLANT

## 2014-09-24 NOTE — Progress Notes (Signed)
Removed telemetry and PIV.  Given education on chest pain and vascular discharge instructions.  No hematoma at time of discharge.  Patient ambulated in room with no complications.  VSS, no complaints of pain.  F/U appt for urology d/t post void residual greater than 300.

## 2014-09-24 NOTE — Progress Notes (Signed)
SUBJECTIVE: Still has intermittent chest pain   Filed Vitals:   09/23/14 1959 09/23/14 2320 09/24/14 0424 09/24/14 0748  BP: 140/74  110/63 128/73  Pulse: 96  88 88  Temp: 98.2 F (36.8 C)  98.4 F (36.9 C) 98.4 F (36.9 C)  TempSrc: Oral  Oral Oral  Resp: 20  20 22   Height:    4\' 11"  (1.499 m)  Weight: 114.669 kg (252 lb 12.8 oz)   114.306 kg (252 lb)  SpO2: 100% 95% 93% 98%    Intake/Output Summary (Last 24 hours) at 09/24/14 0948 Last data filed at 09/24/14 0429  Gross per 24 hour  Intake      0 ml  Output    625 ml  Net   -625 ml    LABS: Basic Metabolic Panel:  Recent Labs  09/23/14 1334 09/24/14 0405  NA 138 142  K 3.5 4.3  CL 97* 105  CO2 29 29  GLUCOSE 128* 222*  BUN 12 12  CREATININE 0.61 0.63  CALCIUM 9.2 9.2   Liver Function Tests: No results for input(s): AST, ALT, ALKPHOS, BILITOT, PROT, ALBUMIN in the last 72 hours. No results for input(s): LIPASE, AMYLASE in the last 72 hours. CBC:  Recent Labs  09/23/14 1334 09/24/14 0405  WBC 10.5 8.8  HGB 11.4* 10.9*  HCT 34.9* 33.0*  MCV 76.4* 76.5*  PLT 238 241   Cardiac Enzymes:  Recent Labs  09/23/14 1334  TROPONINI <0.03   BNP: Invalid input(s): POCBNP D-Dimer: No results for input(s): DDIMER in the last 72 hours. Hemoglobin A1C: No results for input(s): HGBA1C in the last 72 hours. Fasting Lipid Panel:  Recent Labs  09/24/14 0405  CHOL 158  HDL 38*  LDLCALC 68  TRIG 260*  CHOLHDL 4.2   Thyroid Function Tests:  Recent Labs  09/23/14 1334  TSH 2.991   Anemia Panel: No results for input(s): VITAMINB12, FOLATE, FERRITIN, TIBC, IRON, RETICCTPCT in the last 72 hours.   PHYSICAL EXAM General: Well developed, well nourished, in no acute distress HEENT:  Normocephalic and atramatic Neck:  No JVD.  Lungs: Clear bilaterally to auscultation and percussion. Heart: HRRR . Normal S1 and S2 without gallops or murmurs.  Abdomen: Bowel sounds are positive, abdomen soft and  non-tender  Msk:  Back normal, normal gait. Normal strength and tone for age. Extremities: No clubbing, cyanosis or edema.   Neuro: Alert and oriented X 3. Psych:  Good affect, responds appropriately  TELEMETRY: Sinus rhythm  ASSESSMENT AND PLAN: Artery catheterization revealed normal coronaries with normal left radical systolic function. Hyperdynamic LV. Cause of chest pain most likely either costochondritis or GERD. Advise protonic 40 mg by mouth twice a day with follow-up in the office next Monday at 10:00.  Active Problems:   Chest pain    Cristina [name] A, MD, [facility] 09/24/2014 9:48 AM

## 2014-09-24 NOTE — Discharge Instructions (Signed)

## 2014-09-24 NOTE — Discharge Summary (Signed)
[facility] [place] NAME: Cristina [name]    MR#:  [id number]  DATE OF BIRTH:  Jun 22, 1960  DATE OF ADMISSION:  09/23/2014 ADMITTING PHYSICIAN: Dustin Flock, MD  DATE OF DISCHARGE: 09/24/2014  PRIMARY CARE PHYSICIAN: Denton Lank, MD    ADMISSION DIAGNOSIS:  Chest pain, unspecified chest pain type [R07.9]  DISCHARGE DIAGNOSIS:  Active Problems:   Chest pain   SECONDARY DIAGNOSIS:   Past Medical History  Diagnosis Date  . Asthma   . CHF (congestive heart failure)   . Diabetes mellitus without complication   . History of cholecystectomy     hx of cholecystectomy  . H/O tubal ligation     hx btl  . Hypertension   . Stroke   . Abdominal hernia   . Fatty tumor   . Sleep apnea   . Abdominal hernia     HOSPITAL COURSE:   1. Chest pain unspecified- patient was admitted as an observation brought in for cardiac catheterization. Cardiac catheter was negative and EF was normal. Patient discharged home in stable condition. Chest pain resolved. 2. Type 2 diabetes- Glucophage will be held upon discharge with cardiac catheterization and other medications can be continued. 3. History of diastolic congestive heart failure- no signs on this hospital stay. 4. Morbid obesity and sleep apnea- patient wears CPAP at night weight loss needed. 5. Patient feeling that she incompletely empties her bladder. I will have the nursing staff do a bladder scan after urination. May end up needing urology also as outpatient. 6. Hyperlipidemia unspecified continue atorvastatin   DISCHARGE CONDITIONS:   Satisfactory  CONSULTS OBTAINED:  Treatment Team:  Dionisio David, MD  DRUG ALLERGIES:   Allergies  Allergen Reactions  . Codeine     ams  . Thorazine [Chlorpromazine] Swelling    DISCHARGE MEDICATIONS:   Current Discharge Medication List    CONTINUE these medications which have NOT CHANGED   Details  ADVAIR DISKUS 250-50 MCG/DOSE AEPB  Inhale 2 puffs into the lungs daily.    atorvastatin (LIPITOR) 20 MG tablet Take 40 mg by mouth at bedtime.    clonazePAM (KLONOPIN) 0.5 MG tablet Take 0.5 mg by mouth 2 (two) times daily.    cyclobenzaprine (FLEXERIL) 10 MG tablet Take 10 mg by mouth 2 (two) times daily.    diphenoxylate-atropine (LOMOTIL) 2.5-0.025 MG per tablet Take 1 tablet by mouth daily.    esomeprazole (NEXIUM) 40 MG capsule Take 40 mg by mouth daily.    furosemide (LASIX) 40 MG tablet Take 80 mg by mouth every morning.    haloperidol decanoate (HALDOL DECANOATE) 50 MG/ML injection Inject 1 mL into the muscle every 30 (thirty) days.    hydrocortisone 1 % ointment Apply 1 application topically 2 (two) times daily. Qty: 30 g, Refills: 0    hydrocortisone 2.5 % cream Apply 1 application topically daily as needed. For itching of hands.    LEVEMIR FLEXTOUCH 100 UNIT/ML Pen Inject 50-52 Units into the skin See admin instructions. 50 units subcutaneous once a day in the morning and 52 units subcutaneous once day once a day at bedtime.    lidocaine (XYLOCAINE) 5 % ointment Apply 1 application topically daily as needed. For pain.    LYRICA 100 MG capsule Take 100 mg by mouth 3 (three) times daily.    meloxicam (MOBIC) 7.5 MG tablet Take 7.5 mg by mouth 2 (two) times daily.    metoprolol (LOPRESSOR) 50 MG tablet Take 50 mg  by mouth 2 (two) times daily.    mirtazapine (REMERON) 45 MG tablet Take 45 mg by mouth daily.    ondansetron (ZOFRAN) 4 MG tablet Take 1 tablet (4 mg total) by mouth every 8 (eight) hours as needed for nausea or vomiting. Qty: 12 tablet, Refills: 0    PROAIR HFA 108 (90 BASE) MCG/ACT inhaler Inhale 2 puffs into the lungs 3 (three) times daily.    quinapril (ACCUPRIL) 20 MG tablet Take 20 mg by mouth daily.    SPIRIVA HANDIHALER 18 MCG inhalation capsule Place 18 mcg into inhaler and inhale daily.    TRADJENTA 5 MG TABS tablet Take 5 mg by mouth daily.    ketoconazole (NIZORAL) 2 % shampoo  Apply 1 application topically every other day. Apply to scalp.      STOP taking these medications     metFORMIN (GLUCOPHAGE-XR) 500 MG 24 hr tablet        An restart metformin in 48 hours  DISCHARGE INSTRUCTIONS:   Follow-up with Dr. Denton Lank in one week.  If you experience worsening of your admission symptoms, develop shortness of breath, life threatening emergency, suicidal or homicidal thoughts you must seek medical attention immediately by calling 911 or calling your MD immediately  if symptoms less severe.  You Must read complete instructions/literature along with all the possible adverse reactions/side effects for all the Medicines you take and that have been prescribed to you. Take any new Medicines after you have completely understood and accept all the possible adverse reactions/side effects.   Please note  You were cared for by a hospitalist during your hospital stay. If you have any questions about your discharge medications or the care you received while you were in the hospital after you are discharged, you can call the unit and asked to speak with the hospitalist on call if the hospitalist that took care of you is not available. Once you are discharged, your primary care physician will handle any further medical issues. Please note that NO REFILLS for any discharge medications will be authorized once you are discharged, as it is imperative that you return to your primary care physician (or establish a relationship with a primary care physician if you do not have one) for your aftercare needs so that they can reassess your need for medications and monitor your lab values.    Today   CHIEF COMPLAINT:   Chief Complaint  Patient presents with  . Chest Pain    HISTORY OF PRESENT ILLNESS:  Cristina [name]  is a 54 y.o. female with a known history of CHF presents with chest pain. And was admitted for cardiac catheterization   VITAL SIGNS:  Blood pressure 140/74, pulse  92, temperature 98.4 F (36.9 C), temperature source Oral, resp. rate 23, height 4\' 11"  (1.499 m), weight 114.306 kg (252 lb), SpO2 99 %.    PHYSICAL EXAMINATION:  GENERAL:  54 y.o.-year-old patient sitting in chair  with no acute distress.  EYES: Pupils equal, round, reactive to light and accommodation. No scleral icterus. Extraocular muscles intact.  HEENT: Head atraumatic, normocephalic. Oropharynx and nasopharynx clear.  NECK:  Supple, no jugular venous distention. No thyroid enlargement, no tenderness.  LUNGS: decreased  breath sounds bilaterally, no wheezing, rales,rhonchi or crepitation. No use of accessory muscles of respiration.  CARDIOVASCULAR: S1, S2 normal. No murmurs, rubs, or gallops.  ABDOMEN: Soft, non-tender, non-distended. Bowel sounds present. No organomegaly or mass.  EXTREMITIES: 3+  edema,  no cyanosis, or clubbing.  NEUROLOGIC: Cranial nerves II through XII are intact. Muscle strength 5/5 in all extremities. Sensation intact. Gait not checked.  PSYCHIATRIC: The patient is alert and oriented x 3.  SKIN: No obvious rash, lesion, or ulcer.   DATA REVIEW:   CBC  Recent Labs Lab 09/24/14 0405  WBC 8.8  HGB 10.9*  HCT 33.0*  PLT 241    Chemistries   Recent Labs Lab 09/24/14 0405  NA 142  K 4.3  CL 105  CO2 29  GLUCOSE 222*  BUN 12  CREATININE 0.63  CALCIUM 9.2    Cardiac Enzymes  Recent Labs Lab 09/23/14 1334  TROPONINI <0.03   RADIOLOGY:  Dg Chest 2 View  09/23/2014   CLINICAL DATA:  New onset chest pain today, fluid retention per patient, LEFT shoulder and chest pain, history coronary disease post MI, CHF, diabetes mellitus  EXAM: CHEST  2 VIEW  COMPARISON:  02/16/2014  FINDINGS: Enlargement of cardiac silhouette.  Mediastinal contours and pulmonary vascularity normal.  Lungs clear.  No pleural effusion or pneumothorax.  Bones unremarkable.  IMPRESSION: Enlargement of cardiac silhouette.  No acute abnormalities.   Electronically Signed   By:  Lavonia Dana M.D.   On: 09/23/2014 14:12    Management plans discussed with the patient, family and they are in agreement.  CODE STATUS:     Code Status Orders        Start     Ordered   09/24/14 0919  Full code   Continuous     09/24/14 0918      TOTAL TIME TAKING CARE OF THIS PATIENT: 35 minutes.    Loletha Grayer M.D on 09/24/2014 at 12:22 PM  Between 7am to 6pm - Pager - [phone]  After 6pm go to [email or web address] - password [email or web address] [facility]  Office  [phone]  CC: Primary care physician; Denton Lank, MD

## 2014-09-24 NOTE — Progress Notes (Signed)
SUBJECTIVE: Patient still has intermittent chest pain   Filed Vitals:   09/23/14 1959 09/23/14 2320 09/24/14 0424 09/24/14 0748  BP: 140/74  110/63 128/73  Pulse: 96  88 88  Temp: 98.2 F (36.8 C)  98.4 F (36.9 C) 98.4 F (36.9 C)  TempSrc: Oral  Oral Oral  Resp: 20  20 22   Height:    4\' 11"  (1.499 m)  Weight: 114.669 kg (252 lb 12.8 oz)   114.306 kg (252 lb)  SpO2: 100% 95% 93% 98%    Intake/Output Summary (Last 24 hours) at 09/24/14 0919 Last data filed at 09/24/14 0429  Gross per 24 hour  Intake      0 ml  Output    625 ml  Net   -625 ml    LABS: Basic Metabolic Panel:  Recent Labs  09/23/14 1334 09/24/14 0405  NA 138 142  K 3.5 4.3  CL 97* 105  CO2 29 29  GLUCOSE 128* 222*  BUN 12 12  CREATININE 0.61 0.63  CALCIUM 9.2 9.2   Liver Function Tests: No results for input(s): AST, ALT, ALKPHOS, BILITOT, PROT, ALBUMIN in the last 72 hours. No results for input(s): LIPASE, AMYLASE in the last 72 hours. CBC:  Recent Labs  09/23/14 1334 09/24/14 0405  WBC 10.5 8.8  HGB 11.4* 10.9*  HCT 34.9* 33.0*  MCV 76.4* 76.5*  PLT 238 241   Cardiac Enzymes:  Recent Labs  09/23/14 1334  TROPONINI <0.03   BNP: Invalid input(s): POCBNP D-Dimer: No results for input(s): DDIMER in the last 72 hours. Hemoglobin A1C: No results for input(s): HGBA1C in the last 72 hours. Fasting Lipid Panel:  Recent Labs  09/24/14 0405  CHOL 158  HDL 38*  LDLCALC 68  TRIG 260*  CHOLHDL 4.2   Thyroid Function Tests:  Recent Labs  09/23/14 1334  TSH 2.991   Anemia Panel: No results for input(s): VITAMINB12, FOLATE, FERRITIN, TIBC, IRON, RETICCTPCT in the last 72 hours.   PHYSICAL EXAM General: Well developed, well nourished, in no acute distress HEENT:  Normocephalic and atramatic Neck:  No JVD.  Lungs: Clear bilaterally to auscultation and percussion. Heart: HRRR . Normal S1 and S2 without gallops or murmurs.  Abdomen: Bowel sounds are positive, abdomen soft and  non-tender  Msk:  Back normal, normal gait. Normal strength and tone for age. Extremities: No clubbing, cyanosis or edema.   Neuro: Alert and oriented X 3. Psych:  Good affect, responds appropriately  TELEMETRY: Sinus rhythm  ASSESSMENT AND PLAN: Unstable angina with multiple risk factors for coronary artery disease and continues to have chest pain at rest. Plan to do cardiac catheterization to this morning.  Active Problems:   Chest pain    Cristina [name] A, MD, [facility] 09/24/2014 9:19 AM

## 2014-10-29 ENCOUNTER — Encounter: Payer: Self-pay | Admitting: Urology

## 2014-10-29 ENCOUNTER — Ambulatory Visit (INDEPENDENT_AMBULATORY_CARE_PROVIDER_SITE_OTHER): Payer: Medicare Other | Admitting: Urology

## 2014-10-29 VITALS — BP 107/73 | HR 93 | Resp 16 | Ht 59.0 in | Wt 254.8 lb

## 2014-10-29 DIAGNOSIS — N390 Urinary tract infection, site not specified: Secondary | ICD-10-CM | POA: Diagnosis not present

## 2014-10-29 DIAGNOSIS — R339 Retention of urine, unspecified: Secondary | ICD-10-CM | POA: Diagnosis not present

## 2014-10-29 LAB — URINALYSIS, COMPLETE
Bilirubin, UA: NEGATIVE
Glucose, UA: NEGATIVE
Ketones, UA: NEGATIVE
Leukocytes, UA: NEGATIVE
Nitrite, UA: NEGATIVE
Protein, UA: NEGATIVE
RBC, UA: NEGATIVE
Specific Gravity, UA: <1.005 — ABNORMAL LOW (ref 1.005–1.030)
Urobilinogen, Ur: 0.2 mg/dL (ref 0.2–1.0)
pH, UA: 5.5 (ref 5.0–7.5)

## 2014-10-29 LAB — MICROSCOPIC EXAMINATION
Bacteria, UA: NONE SEEN
RBC, UA: NONE SEEN /hpf (ref 0–?)
WBC, UA: NONE SEEN /hpf (ref 0–?)

## 2014-10-29 MED ORDER — BETHANECHOL CHLORIDE 10 MG PO TABS: 10.0000 mg | ORAL_TABLET | Freq: Three times a day (TID) | ORAL | Status: DC

## 2014-10-29 MED ORDER — METHENAMINE HIPPURATE 1 G PO TABS: 1.0000 g | ORAL_TABLET | Freq: Two times a day (BID) | ORAL | Status: DC

## 2014-10-29 NOTE — Progress Notes (Signed)
Consultation for recurrent UTI and incomplete bladder emptying.  1-recurrent UTI-patient with recurrent dysuria and urinary tract infection. She's had prior cultures positive for Escherichia coli. She gets dysuria and frequency. She had some positive urine and blood cultures free of coli January 2016. Antibiotic seem to help her symptoms but she'll get recurrent episodes. Associated with incomplete bladder emptying. Today patient has no dysuria. She did undergo CT scan of the abdomen and pelvis June 2016 which showed a normal urinary tract but a moderately distended bladder. Patient reports no gross hematuria apart from symptomatic UTI.   2-incomplete bladder emptying-patient has been told she had incomplete bladder emptying. She said she underwent a "bladder scan" which showed an elevated post void residual. CT scan as above showed a distended bladder. Patient typically has a weak stream with occasional frequency and urgency. She has mild incontinence during the day but worse at night. Neurogenic risk includes diabetes mellitus, schizophrenia, bipolar disorder. She is on several medicines that could have an anticholinergic property. She uses a walker. She denies urologic surgery.  A 14 system review of systems was obtained which was negative except for the following: Fever, night sweats, weight loss, fatigue, skin rash, blurred vision, swollen glands, leg swelling, chest pain, shortness of breath, excessive thirst, back pain, joint pain, headaches and dizziness.  Her past medical, surgical history, allergies, medications were reviewed. Significant findings as above.  Physical exam: Patient is in no acute distress. Using walker to get around.  #1-recurrent urinary tract infection-will send urine for culture. Given history of incomplete bladder emptying we discussed nature risk and benefits of methenamine and patient elected to start.   #2-incomplete bladder emptying-possible neurogenic component. We  discussed the role of urodynamics in evaluating bladder function but she declined. She does have some transportation issues. Given her urinary tract infection issues and incomplete emptying will see her back for cystoscopy with a cathed-post void residual. I'm not certain of bladder scanner would be accurate. We did discuss trial of bethanechol and the nature risks and benefits of bethanechol and she elected to start.

## 2014-10-31 ENCOUNTER — Ambulatory Visit: Payer: Self-pay

## 2014-11-23 ENCOUNTER — Other Ambulatory Visit: Payer: Medicare Other

## 2014-11-26 ENCOUNTER — Telehealth: Payer: Self-pay

## 2014-11-26 NOTE — Telephone Encounter (Signed)
Pt called stating stating she is having severe diarrhea, vomiting, and not able to function. Pt was started on methenamine and bethanechol. Please advise.

## 2014-11-27 NOTE — Telephone Encounter (Signed)
Spoke with pt in reference to medication. Pt stated she does not feel any better. Per Ria Comment and Dr. Evorn Gong pt to stop medication. Pt voiced understanding. While speaking with pt she requested to cancel cysto appt. Nurse reinforced with pt the need to keep the cysto appt. Nurse offered to hold pt hand to encourage her to come to appt. Pt agreed to keep appt if nurse held her hand.

## 2014-11-27 NOTE — Telephone Encounter (Signed)
Per Dr. Lyndal Rainbow last note that the patient was only started on bethanechol.  Her nausea and vomiting could be a side effect of this medication. She can try stopping the medications? and see if her symptoms improve. If they do not she needs to be seen by her primary care provider. If she feels that she is becoming extremely dehydrated she should be seen at an urgent care or at the emergency Department to receive IV fluids. She has a follow-up appointment already scheduled in one week.   thanks

## 2014-11-27 NOTE — Telephone Encounter (Signed)
-----   Message from Festus Aloe, MD sent at 11/27/2014  7:36 AM EDT ----- See patient call note. I don't know how to respond to it. Run it by [employer] or [employer]. Something about methenamine.

## 2014-12-04 ENCOUNTER — Other Ambulatory Visit: Payer: Medicare Other

## 2014-12-04 ENCOUNTER — Encounter: Payer: Self-pay | Admitting: Emergency Medicine

## 2014-12-04 ENCOUNTER — Encounter: Payer: Self-pay | Admitting: Urology

## 2014-12-04 ENCOUNTER — Other Ambulatory Visit: Payer: Medicare Other | Admitting: Urology

## 2014-12-04 ENCOUNTER — Emergency Department
Admission: EM | Admit: 2014-12-04 | Discharge: 2014-12-05 | Disposition: A | Discharge status: Other Acute Inpatient Hospital | Payer: Medicare Other | Attending: Emergency Medicine | Admitting: Emergency Medicine

## 2014-12-04 ENCOUNTER — Ambulatory Visit (INDEPENDENT_AMBULATORY_CARE_PROVIDER_SITE_OTHER): Payer: Medicare Other | Admitting: Urology

## 2014-12-04 VITALS — BP 130/82 | HR 97 | Ht 59.0 in | Wt 255.9 lb

## 2014-12-04 DIAGNOSIS — R339 Retention of urine, unspecified: Secondary | ICD-10-CM

## 2014-12-04 DIAGNOSIS — N390 Urinary tract infection, site not specified: Secondary | ICD-10-CM | POA: Diagnosis not present

## 2014-12-04 DIAGNOSIS — I10 Essential (primary) hypertension: Secondary | ICD-10-CM | POA: Insufficient documentation

## 2014-12-04 DIAGNOSIS — Y998 Other external cause status: Secondary | ICD-10-CM | POA: Diagnosis not present

## 2014-12-04 DIAGNOSIS — Y9389 Activity, other specified: Secondary | ICD-10-CM | POA: Diagnosis not present

## 2014-12-04 DIAGNOSIS — Z87891 Personal history of nicotine dependence: Secondary | ICD-10-CM | POA: Insufficient documentation

## 2014-12-04 DIAGNOSIS — Y9289 Other specified places as the place of occurrence of the external cause: Secondary | ICD-10-CM | POA: Insufficient documentation

## 2014-12-04 DIAGNOSIS — T50902A Poisoning by unspecified drugs, medicaments and biological substances, intentional self-harm, initial encounter: Secondary | ICD-10-CM

## 2014-12-04 DIAGNOSIS — E119 Type 2 diabetes mellitus without complications: Secondary | ICD-10-CM | POA: Insufficient documentation

## 2014-12-04 DIAGNOSIS — T39312A Poisoning by propionic acid derivatives, intentional self-harm, initial encounter: Secondary | ICD-10-CM | POA: Insufficient documentation

## 2014-12-04 LAB — MICROSCOPIC EXAMINATION
Bacteria, UA: NONE SEEN
Renal Epithel, UA: NONE SEEN /hpf
WBC, UA: NONE SEEN /hpf (ref 0–?)

## 2014-12-04 LAB — COMPREHENSIVE METABOLIC PANEL
ALT: 25 U/L (ref 14–54)
AST: 21 U/L (ref 15–41)
Albumin: 4.1 g/dL (ref 3.5–5.0)
Alkaline Phosphatase: 118 U/L (ref 38–126)
Anion gap: 12 (ref 5–15)
BUN: 17 mg/dL (ref 6–20)
CO2: 29 mmol/L (ref 22–32)
Calcium: 9.5 mg/dL (ref 8.9–10.3)
Chloride: 94 mmol/L — ABNORMAL LOW (ref 101–111)
Creatinine, Ser: 0.89 mg/dL (ref 0.44–1.00)
GFR calc Af Amer: >60 mL/min (ref >60)
GFR calc non Af Amer: >60 mL/min (ref >60)
Glucose, Bld: 100 mg/dL — ABNORMAL HIGH (ref 65–99)
Potassium: 4 mmol/L (ref 3.5–5.1)
Sodium: 135 mmol/L (ref 135–145)
Total Bilirubin: 0.5 mg/dL (ref 0.3–1.2)
Total Protein: 7.1 g/dL (ref 6.5–8.1)

## 2014-12-04 LAB — CBC WITH DIFFERENTIAL/PLATELET
Basophils Absolute: 0.1 10*3/uL (ref 0–0.1)
Basophils Relative: 1 %
Eosinophils Absolute: 0.2 10*3/uL (ref 0–0.7)
Eosinophils Relative: 2 %
HCT: 34.1 % — ABNORMAL LOW (ref 35.0–47.0)
Hemoglobin: 10.9 g/dL — ABNORMAL LOW (ref 12.0–16.0)
Lymphocytes Relative: 16 %
Lymphs Abs: 1.6 10*3/uL (ref 1.0–3.6)
MCH: 23.8 pg — ABNORMAL LOW (ref 26.0–34.0)
MCHC: 32.1 g/dL (ref 32.0–36.0)
MCV: 74.2 fL — ABNORMAL LOW (ref 80.0–100.0)
Monocytes Absolute: 0.7 10*3/uL (ref 0.2–0.9)
Monocytes Relative: 7 %
Neutro Abs: 7.1 10*3/uL — ABNORMAL HIGH (ref 1.4–6.5)
Neutrophils Relative %: 74 %
Platelets: 258 10*3/uL (ref 150–440)
RBC: 4.59 MIL/uL (ref 3.80–5.20)
RDW: 17.4 % — ABNORMAL HIGH (ref 11.5–14.5)
WBC: 9.7 10*3/uL (ref 3.6–11.0)

## 2014-12-04 LAB — URINALYSIS COMPLETE WITH MICROSCOPIC (ARMC ONLY)
Bacteria, UA: NONE SEEN
Bilirubin Urine: NEGATIVE
Glucose, UA: NEGATIVE mg/dL
Hgb urine dipstick: NEGATIVE
Ketones, ur: NEGATIVE mg/dL
Nitrite: NEGATIVE
Protein, ur: NEGATIVE mg/dL
Specific Gravity, Urine: 1.006 (ref 1.005–1.030)
pH: 5 (ref 5.0–8.0)

## 2014-12-04 LAB — URINE DRUG SCREEN, QUALITATIVE (ARMC ONLY)
Amphetamines, Ur Screen: NOT DETECTED
Barbiturates, Ur Screen: NOT DETECTED
Benzodiazepine, Ur Scrn: NOT DETECTED
Cannabinoid 50 Ng, Ur ~~LOC~~: NOT DETECTED
Cocaine Metabolite,Ur ~~LOC~~: NOT DETECTED
MDMA (Ecstasy)Ur Screen: NOT DETECTED
Methadone Scn, Ur: NOT DETECTED
Opiate, Ur Screen: NOT DETECTED
Phencyclidine (PCP) Ur S: NOT DETECTED
Tricyclic, Ur Screen: POSITIVE — AB

## 2014-12-04 LAB — URINALYSIS, COMPLETE
Bilirubin, UA: NEGATIVE
Glucose, UA: NEGATIVE
Ketones, UA: NEGATIVE
Leukocytes, UA: NEGATIVE
Nitrite, UA: NEGATIVE
Protein, UA: NEGATIVE
Specific Gravity, UA: <1.005 — ABNORMAL LOW (ref 1.005–1.030)
Urobilinogen, Ur: 0.2 mg/dL (ref 0.2–1.0)
pH, UA: 6 (ref 5.0–7.5)

## 2014-12-04 LAB — GLUCOSE, CAPILLARY: Glucose-Capillary: 76 mg/dL (ref 65–99)

## 2014-12-04 LAB — PROTIME-INR
INR: 0.96
Prothrombin Time: 13 seconds (ref 11.4–15.0)

## 2014-12-04 LAB — ETHANOL: Alcohol, Ethyl (B): 19 mg/dL — ABNORMAL HIGH (ref <5)

## 2014-12-04 LAB — ACETAMINOPHEN LEVEL: Acetaminophen (Tylenol), Serum: <10 ug/mL — ABNORMAL LOW (ref 10–30)

## 2014-12-04 LAB — SALICYLATE LEVEL: Salicylate Lvl: 5.5 mg/dL (ref 2.8–30.0)

## 2014-12-04 MED ORDER — LIDOCAINE HCL 2 % EX GEL
1.0000 "application " | Freq: Once | CUTANEOUS | Status: AC
  Administered 2014-12-04: 1 via URETHRAL

## 2014-12-04 MED ORDER — CEPHALEXIN 500 MG PO CAPS: 500.0000 mg | ORAL_CAPSULE | Freq: Two times a day (BID) | ORAL | Status: DC

## 2014-12-04 MED ORDER — ACETAMINOPHEN 325 MG PO TABS
650.0000 mg | ORAL_TABLET | Freq: Once | ORAL | Status: AC
  Administered 2014-12-04: 650 mg via ORAL
  Filled 2014-12-04: qty 2

## 2014-12-04 MED ORDER — CIPROFLOXACIN HCL 500 MG PO TABS
500.0000 mg | ORAL_TABLET | Freq: Once | ORAL | Status: AC
  Administered 2014-12-04: 500 mg via ORAL

## 2014-12-04 NOTE — ED Notes (Signed)

## 2014-12-04 NOTE — [facility] Counselor (Signed)
Per consult with Dr. Jerilee Hoh at 10:02 pm, pt meets criteria for inpatient admission.

## 2014-12-04 NOTE — ED Notes (Signed)
Patient assigned to appropriate care area. Patient oriented to unit/care area: Informed that, for their safety, care areas are designed for safety and monitored by security cameras at all times; and visiting hours explained to patient. Patient verbalizes understanding, and verbal contract for safety obtained. 

## 2014-12-04 NOTE — ED Notes (Signed)
BEHAVIORAL HEALTH ROUNDING Patient sleeping: No. Patient alert and oriented: yes Behavior appropriate: Yes.  ; If no, describe:  Nutrition and fluids offered: Yes Toileting and hygiene offered: Yes  Sitter present: yes Law enforcement present: Yes ODS  

## 2014-12-04 NOTE — BH Assessment (Addendum)
Assessment Note  Cristina [name] is a 54 y.o. female presenting to the ED after having taken an overdose to kill herself. She states that she was trying to go to sleep and never wake up. She reports taking 8 Advil PM which consist of naproxen to 20 mg and diphenhydramine 25 as well as "a few more" Advil daytime. She reports being "fed up" with being in pain all the time.  Her social worker became concerned about her, he went to her house and she confessed to what she had done.  Social worker's name is [employer], and his phone number is [phone].  Pt denies auditory/visual hallucinations.  Diagnosis: Suicidal  Past Medical History:  Past Medical History  Diagnosis Date  . Asthma   . CHF (congestive heart failure) ([place])   . Diabetes mellitus without complication ([place])   . History of cholecystectomy     hx of cholecystectomy  . H/O tubal ligation     hx btl  . Hypertension   . Stroke ([place])   . Abdominal hernia   . Fatty tumor   . Sleep apnea   . COPD (chronic obstructive pulmonary disease) ([place])   . H/O colonoscopy   . Bladder dysfunction   . Muscle cramps   . Vaginitis   . Recurrent boils   . Dyspnea   . Edema   . DDD (degenerative disc disease), lumbar   . UTI (lower urinary tract infection)   . Carpal tunnel syndrome   . Neuropathy ([place])   . Obesity   . Arthritis   . Schizoaffective disorder ([place])   . GERD (gastroesophageal reflux disease)   . HLD (hyperlipidemia)     Past Surgical History  Procedure Laterality Date  . Cholecystectomy    . Tubal ligation    . Cardiac catheterization Left 09/24/2014    Procedure: Left Heart Cath and Coronary Angiography;  Surgeon: Dionisio David, MD;  Location: [facility];  Service: Cardiovascular;  Laterality: Left;    Family History:  Family History  Problem Relation Age of Onset  . CAD Mother     Social History:  reports that she has quit smoking. She does not have any smokeless tobacco history on file. She  reports that she does not drink alcohol or use illicit drugs.  Additional Social History:  Alcohol / Drug Use History of alcohol / drug use?: No history of alcohol / drug abuse  CIWA: CIWA-Ar BP: 107/67 mmHg Pulse Rate: 81 COWS:    Allergies:  Allergies  Allergen Reactions  . Thorazine [Chlorpromazine] Swelling    Home Medications:  (Not in a hospital admission)  OB/GYN Status:  No LMP recorded. Patient is postmenopausal.  General Assessment Data Location of Assessment: [facility] ED TTS Assessment: In system Is this a Tele or Face-to-Face Assessment?: Face-to-Face Is this an Initial Assessment or a Re-assessment for this encounter?: Initial Assessment Marital status: Single Maiden name: N/A Is patient pregnant?: No Pregnancy Status: No Living Arrangements: Alone Can pt return to current living arrangement?: No Admission Status: Voluntary Is patient capable of signing voluntary admission?: No Referral Source: Self/Family/Friend Insurance type: Medicare  Medical Screening Exam ([place]) Medical Exam completed: Yes  Crisis Care Plan Living Arrangements: Alone Name of Psychiatrist: [name] Name of Therapist: [name]  Education Status Is patient currently in school?: No Current Grade: N/A Highest grade of school patient has completed: N/A Name of school: N/A Contact person: N/A  Risk to self with the past 6  months Suicidal Ideation: Yes-Currently Present Has patient been a risk to self within the past 6 months prior to admission? : No Suicidal Intent: Yes-Currently Present Has patient had any suicidal intent within the past 6 months prior to admission? : No Is patient at risk for suicide?: Yes Suicidal Plan?: Yes-Currently Present Has patient had any suicidal plan within the past 6 months prior to admission? : Yes Specify Current Suicidal Plan: Pt planned to OD on Tylenol Access to Means: Yes Specify Access to Suicidal Means: Pt has access to  pills What has been your use of drugs/alcohol within the last 12 months?: None reported Previous Attempts/Gestures: No How many times?: 0 Other Self Harm Risks: None Triggers for Past Attempts: Other (Comment) (Being in pain) Intentional Self Injurious Behavior: None Family Suicide History: No Recent stressful life event(s): Other (Comment) Persecutory voices/beliefs?: No Depression: No Substance abuse history and/or treatment for substance abuse?: No Suicide prevention information given to non-admitted patients: Not applicable  Risk to Others within the past 6 months Homicidal Ideation: No Does patient have any lifetime risk of violence toward others beyond the six months prior to admission? : No Thoughts of Harm to Others: No Current Homicidal Intent: No Current Homicidal Plan: No Access to Homicidal Means: No Identified Victim: N/A History of harm to others?: No Assessment of Violence: None Noted Violent Behavior Description: N/A Does patient have access to weapons?: No Criminal Charges Pending?: No Does patient have a court date: No Is patient on probation?: No  Psychosis Hallucinations: None noted Delusions: None noted  Mental Status Report Appearance/Hygiene: In scrubs Eye Contact: Good Motor Activity: Freedom of movement Speech: Logical/coherent Level of Consciousness: Alert Mood: Anxious, Suspicious Affect: Appropriate to circumstance Anxiety Level: Minimal Thought Processes: Circumstantial Judgement: Partial Orientation: Person, Place, Time, Situation Obsessive Compulsive Thoughts/Behaviors: None  Cognitive Functioning Concentration: Normal Memory: Recent Intact IQ: Average Insight: Poor Impulse Control: Poor Appetite: Good Weight Loss: 0 Weight Gain: 0 Sleep: No Change Total Hours of Sleep: 8 Vegetative Symptoms: None  ADLScreening [facility] Assessment Services) Patient's cognitive ability adequate to safely complete daily activities?: Yes Patient  able to express need for assistance with ADLs?: Yes Independently performs ADLs?: Yes (appropriate for developmental age)  Prior Inpatient Therapy Prior Inpatient Therapy: No Prior Therapy Dates: N/A Prior Therapy Facilty/Provider(s): N/A Reason for Treatment: N/A  Prior Outpatient Therapy Prior Outpatient Therapy: No Prior Therapy Dates: N/A Prior Therapy Facilty/Provider(s): N/A Reason for Treatment: N/A Does patient have an ACCT team?: Yes Ivor [facility]) Does patient have Intensive In-House Services?  : No Does patient have [facility] services? : No Does patient have P4CC services?: No  ADL Screening (condition at time of admission) Patient's cognitive ability adequate to safely complete daily activities?: Yes Patient able to express need for assistance with ADLs?: Yes Independently performs ADLs?: Yes (appropriate for developmental age)       Abuse/Neglect Assessment (Assessment to be complete while patient is alone) Physical Abuse: Denies Verbal Abuse: Denies Sexual Abuse: Denies Exploitation of patient/patient's resources: Denies Self-Neglect: Denies Values / Beliefs Cultural Requests During Hospitalization: None Spiritual Requests During Hospitalization: None Consults Spiritual Care Consult Needed: No Social Work Consult Needed: No Regulatory affairs officer (For Healthcare) Does patient have an advance directive?: No Would patient like information on creating an advanced directive?: No - patient declined information    Additional Information 1:1 In Past 12 Months?: Yes CIRT Risk: Yes Elopement Risk: Yes Does patient have medical clearance?: Yes     Disposition:  Disposition Initial Assessment Completed  for this Encounter: Yes Disposition of Patient: Inpatient treatment program Type of inpatient treatment program: Adult  On Site Evaluation by:   Reviewed with Physician:    Oneita Hurt 12/04/2014 11:28 PM

## 2014-12-04 NOTE — Progress Notes (Signed)
12/04/2014 10:56 AM   Cristina [name] 1960-09-06 [id number]  Referring provider: Denton Lank, MD [facility] [address] [place], [place] [address]  Chief Complaint  Patient presents with  . Cysto    HPI:   1-recurrent UTI-patient with recurrent dysuria and urinary tract infection. She's had prior cultures positive for Escherichia coli. She gets dysuria and frequency. She had some positive urine and blood cultures free of coli January 2016. Antibiotic seem to help her symptoms but she'll get recurrent episodes. Associated with incomplete bladder emptying. Today patient has no dysuria. She did undergo CT scan of the abdomen and pelvis June 2016 which showed a normal urinary tract but a moderately distended bladder. Cysto 11/2014 unremakrble. PVR 11/2014 "510mL".   2-incomplete bladder emptying-patient has been told she had incomplete bladder emptying. She said she underwent a "bladder scan" which showed an elevated post void residual.Confirmed PVR 500 mL 2016.  CT scan as above showed a distended bladder. Patient typically has a weak stream with occasional frequency and urgency. She has mild incontinence during the day but worse at night. Neurogenic risk includes diabetes mellitus, schizophrenia, bipolar disorder. She is on several medicines that could have an anticholinergic property. She uses a walker. She denies urologic surgery. Given trial of bethanecol but did not tollerate due to GI effects. She adamantly refuses self cath or prn [facility] cath.   Today "Clevie" is seen for cysto and PVR to complete eval for recurent cystitis.   PMH: Past Medical History  Diagnosis Date  . Asthma   . CHF (congestive heart failure)   . Diabetes mellitus without complication   . History of cholecystectomy     hx of cholecystectomy  . H/O tubal ligation     hx btl  . Hypertension   . Stroke   . Abdominal hernia   . Fatty tumor   . Sleep apnea   . COPD (chronic obstructive pulmonary disease)   . H/O  colonoscopy   . Bladder dysfunction   . Muscle cramps   . Vaginitis   . Recurrent boils   . Dyspnea   . Edema   . DDD (degenerative disc disease), lumbar   . UTI (lower urinary tract infection)   . Carpal tunnel syndrome   . Neuropathy   . Obesity   . Arthritis   . Schizoaffective disorder   . GERD (gastroesophageal reflux disease)   . HLD (hyperlipidemia)     Surgical History: Past Surgical History  Procedure Laterality Date  . Cholecystectomy    . Tubal ligation    . Cardiac catheterization Left 09/24/2014    Procedure: Left Heart Cath and Coronary Angiography;  Surgeon: Dionisio David, MD;  Location: [facility];  Service: Cardiovascular;  Laterality: Left;    Home Medications:    Medication List       This list is accurate as of: 12/04/14 10:56 AM.  Always use your most recent med list.               acetaminophen 500 MG tablet  Commonly known as:  TYLENOL  Take 500 mg by mouth every 6 (six) hours as needed.     ADVAIR DISKUS 250-50 MCG/DOSE Aepb  Generic drug:  Fluticasone-Salmeterol  Inhale 2 puffs into the lungs daily.     aspirin EC 81 MG tablet  Take 81 mg by mouth daily.     atorvastatin 20 MG tablet  Commonly known as:  LIPITOR  Take 40 mg by mouth  at bedtime.     bethanechol 10 MG tablet  Commonly known as:  URECHOLINE  Take 1 tablet (10 mg total) by mouth 3 (three) times daily.     cetirizine 5 MG tablet  Commonly known as:  ZYRTEC  Take 5 mg by mouth daily.     clonazePAM 0.5 MG tablet  Commonly known as:  KLONOPIN  Take 0.5 mg by mouth 2 (two) times daily.     colchicine 0.6 MG tablet  Take 0.6 mg by mouth daily.     cyclobenzaprine 10 MG tablet  Commonly known as:  FLEXERIL  Take 10 mg by mouth 2 (two) times daily.     diphenoxylate-atropine 2.5-0.025 MG tablet  Commonly known as:  LOMOTIL  Take 1 tablet by mouth daily.     esomeprazole 40 MG capsule  Commonly known as:  NEXIUM  Take 40 mg by mouth daily.      furosemide 40 MG tablet  Commonly known as:  LASIX  Take 80 mg by mouth every morning.     haloperidol decanoate 50 MG/ML injection  Commonly known as:  HALDOL DECANOATE  Inject 1 mL into the muscle every 30 (thirty) days.     hydrocortisone 1 % ointment  Apply 1 application topically 2 (two) times daily.     hydrocortisone 2.5 % cream  Apply 1 application topically daily as needed. For itching of hands.     ketoconazole 2 % shampoo  Commonly known as:  NIZORAL  Apply 1 application topically every other day. Apply to scalp.     LEVEMIR FLEXTOUCH 100 UNIT/ML Pen  Generic drug:  Insulin Detemir  Inject 50-52 Units into the skin See admin instructions. 50 units subcutaneous once a day in the morning and 52 units subcutaneous once day once a day at bedtime.     lidocaine 5 %  Commonly known as:  LIDODERM     LYRICA 100 MG capsule  Generic drug:  pregabalin  Take 100 mg by mouth 3 (three) times daily.     meloxicam 7.5 MG tablet  Commonly known as:  MOBIC  Take 7.5 mg by mouth 2 (two) times daily.     metFORMIN 500 MG 24 hr tablet  Commonly known as:  GLUCOPHAGE-XR     methenamine 1 G tablet  Commonly known as:  HIPREX  Take 1 tablet (1 g total) by mouth 2 (two) times daily with a meal.     metoprolol 50 MG tablet  Commonly known as:  LOPRESSOR  Take 50 mg by mouth 2 (two) times daily.     mirtazapine 45 MG tablet  Commonly known as:  REMERON  Take 45 mg by mouth daily.     ondansetron 4 MG tablet  Commonly known as:  ZOFRAN  Take 1 tablet (4 mg total) by mouth every 8 (eight) hours as needed for nausea or vomiting.     PEN NEEDLES 31GX5/16" 31G X 8 MM Misc     potassium chloride 10 MEQ tablet  Commonly known as:  K-DUR     PROAIR HFA 108 (90 BASE) MCG/ACT inhaler  Generic drug:  albuterol  Inhale 2 puffs into the lungs 3 (three) times daily.     quinapril 20 MG tablet  Commonly known as:  ACCUPRIL  Take 20 mg by mouth daily.     SPIRIVA HANDIHALER 18 MCG  inhalation capsule  Generic drug:  tiotropium  Place 18 mcg into inhaler and inhale daily.  sulfamethoxazole-trimethoprim 800-160 MG tablet  Commonly known as:  BACTRIM DS,SEPTRA DS     TRADJENTA 5 MG Tabs tablet  Generic drug:  linagliptin  Take 5 mg by mouth daily.     VOLTAREN 1 % Gel  Generic drug:  diclofenac sodium        Allergies:  Allergies  Allergen Reactions  . Codeine     ams  . Thorazine [Chlorpromazine] Swelling    Family History: Family History  Problem Relation Age of Onset  . CAD Mother     Social History:  reports that she has quit smoking. She does not have any smokeless tobacco history on file. She reports that she does not drink alcohol or use illicit drugs.  ROS:     Review of Systems  Gastrointestinal (upper)  : Negative for upper GI symptoms  Gastrointestinal (lower) : Negative for lower GI symptoms  Constitutional : Negative for symptoms  Skin: Negative for skin symptoms  Eyes: Negative for eye symptoms  Ear/Nose/Throat : Negative for Ear/Nose/Throat symptoms  Hematologic/Lymphatic: Negative for Hematologic/Lymphatic symptoms  Cardiovascular : Negative for cardiovascular symptoms  Respiratory : Negative for respiratory symptoms  Endocrine: Negative for endocrine symptoms  Musculoskeletal: Negative for musculoskeletal symptoms  Neurological: Negative for neurological symptoms  Psychologic: Negative for psychiatric symptoms    Physical Exam: There were no vitals taken for this visit.  Constitutional:  Alert and oriented, No acute distress. HEENT:  AT, moist mucus membranes.  Trachea midline, no masses. Cardiovascular: No clubbing, cyanosis, or edema. Respiratory: Normal respiratory effort, no increased work of breathing. GI: Abdomen is soft, nontender, nondistended, no abdominal masses GU: No CVA tenderness. Large truncal obesity.  Skin: No rashes, bruises or suspicious lesions. Lymph: No cervical or  inguinal adenopathy. Neurologic: Grossly intact, no focal deficits, moving all 4 extremities. Psychiatric: Normal mood and affect.  Laboratory Data: Lab Results  Component Value Date   WBC 8.8 09/24/2014   HGB 10.9* 09/24/2014   HCT 33.0* 09/24/2014   MCV 76.5* 09/24/2014   PLT 241 09/24/2014    Lab Results  Component Value Date   CREATININE 0.63 09/24/2014    No results found for: PSA  No results found for: TESTOSTERONE  Lab Results  Component Value Date   HGBA1C 8.0* 09/23/2014    Urinalysis    Component Value Date/Time   COLORURINE STRAW* 08/01/2014 1944   COLORURINE Yellow 02/16/2014 1145   APPEARANCEUR CLEAR* 08/01/2014 1944   APPEARANCEUR Turbid 02/16/2014 1145   LABSPEC 1.003* 08/01/2014 1944   LABSPEC 1.012 02/16/2014 1145   PHURINE 6.0 08/01/2014 1944   PHURINE 6.0 02/16/2014 1145   GLUCOSEU Negative 10/29/2014 1408   GLUCOSEU Negative 02/16/2014 1145   HGBUR NEGATIVE 08/01/2014 1944   HGBUR 2+ 02/16/2014 1145   BILIRUBINUR Negative 10/29/2014 1408   BILIRUBINUR NEGATIVE 08/01/2014 1944   BILIRUBINUR Negative 02/16/2014 [place] 08/01/2014 1944   KETONESUR 1+ 02/16/2014 1145   PROTEINUR NEGATIVE 08/01/2014 1944   PROTEINUR 100 mg/dL 02/16/2014 1145   NITRITE Negative 10/29/2014 1408   NITRITE NEGATIVE 08/01/2014 1944   NITRITE Positive 02/16/2014 1145   LEUKOCYTESUR Negative 10/29/2014 1408   LEUKOCYTESUR TRACE* 08/01/2014 1944   LEUKOCYTESUR 3+ 02/16/2014 1145      Cystoscopy Procedure Note  Patient identification was confirmed, informed consent was obtained, and patient was prepped using Betadine solution.  Lidocaine jelly was administered per urethral meatus.    Preoperative abx where received prior to procedure.    Procedure: - Flexible cystoscope  introduced, without any difficulty.   - Thorough search of the bladder revealed:    normal urethral meatus    normal urothelium    no stones    no ulcers     no tumors     no urethral polyps    no trabeculation  - Ureteral orifices were normal in position and appearance.  Post-Procedure: - Patient tolerated the procedure well  PVR 500 mL by cath pre/cysto.    Assessment & Plan:   1-recurrent UTI-eval with labs, exam, imaging, cysto with poor bladder emptying as only modifiable factor as per below.   Rec self-start Keflex 500 BID x 3 days prn suspect UTI, notify provider if refracotry.   2-incomplete bladder emptying- likely side effect of anticholinergic properties of psychotropic meds. Strongly reccommended QD or BID self cath or [facility] RN cath to help reduce UTI risk and risk or renal deterioration but she refuses. I think chronic tubes are not ideal given her body habitus and would likely lead to even more infections.  We both agreed on contiued care and self-start therapy as per above, consider prn cath / chronic tubes if worsens / refracotry.  3 - RTC 1 year for visit and PVR.    No Follow-up on file.  Alexis Frock, [facility] [address], [place], [place] [address] [phone]

## 2014-12-04 NOTE — ED Notes (Signed)
Pt provided blanket, diet cola.  Pt states that she will probably take more medicine because she is tired of being in pain.  1:1 sitter remains at bedside.

## 2014-12-04 NOTE — ED Notes (Signed)
Per EMS pt OD today, pt states at approx 1300. Per EMS pt took 2 handfuls of Aleve daytime and Nighttime. EMS states that she admitted to them and her social worker that she overdosed due to being told that she would have to cath herself 2 times a day. Pt presents alert and talking with staff.

## 2014-12-04 NOTE — ED Notes (Signed)
Report given to Brad

## 2014-12-04 NOTE — ED Provider Notes (Addendum)
[facility] Emergency Department Provider Note  ____________________________________________   I have reviewed the triage vital signs and the nursing notes.   HISTORY  Chief Complaint Ingestion    HPI Cristina [name] is a 54 y.o. female who presents today complaining of having taken an overdose to kill herself. She especially states that she was trying to go to sleep and never wake up. She took she states perhaps 8 Advil PM which consist of naproxen to 20 mg and diphenhydramine 25 as well as "a few more" Advil daytime. Patient took no Tylenol or other overdose and she did not take extra of her other medications. She did this around 4:00. She does express a desire to harm her self. She did not tell me why. She has had no symptoms since that time, her social worker became concerned about her, he went to her house and she confessed to what she had done. She has not had any vomiting. Social worker's name is Seabron Spates, and his phone number is [phone]  Past Medical History  Diagnosis Date  . Asthma   . CHF (congestive heart failure) ([place])   . Diabetes mellitus without complication ([place])   . History of cholecystectomy     hx of cholecystectomy  . H/O tubal ligation     hx btl  . Hypertension   . Stroke ([place])   . Abdominal hernia   . Fatty tumor   . Sleep apnea   . COPD (chronic obstructive pulmonary disease) ([place])   . H/O colonoscopy   . Bladder dysfunction   . Muscle cramps   . Vaginitis   . Recurrent boils   . Dyspnea   . Edema   . DDD (degenerative disc disease), lumbar   . UTI (lower urinary tract infection)   . Carpal tunnel syndrome   . Neuropathy ([place])   . Obesity   . Arthritis   . Schizoaffective disorder ([place])   . GERD (gastroesophageal reflux disease)   . HLD (hyperlipidemia)     Patient Active Problem List   Diagnosis Date Noted  . Incomplete bladder emptying 12/04/2014  . Recurrent UTI 12/04/2014  . Chest pain 09/23/2014     Past Surgical History  Procedure Laterality Date  . Cholecystectomy    . Tubal ligation    . Cardiac catheterization Left 09/24/2014    Procedure: Left Heart Cath and Coronary Angiography;  Surgeon: Dionisio David, MD;  Location: [facility];  Service: Cardiovascular;  Laterality: Left;    Current Outpatient Rx  Name  Route  Sig  Dispense  Refill  . acetaminophen (TYLENOL) 500 MG tablet   Oral   Take 500 mg by mouth every 6 (six) hours as needed.         Marland Kitchen ADVAIR DISKUS 250-50 MCG/DOSE AEPB   Inhalation   Inhale 2 puffs into the lungs daily.           Dispense as written.   Marland Kitchen aspirin EC 81 MG tablet   Oral   Take 81 mg by mouth daily.         Marland Kitchen atorvastatin (LIPITOR) 20 MG tablet   Oral   Take 40 mg by mouth at bedtime.         . bethanechol (URECHOLINE) 10 MG tablet   Oral   Take 1 tablet (10 mg total) by mouth 3 (three) times daily.   90 tablet   11   . cephALEXin (KEFLEX) 500 MG capsule  Oral   Take 1 capsule (500 mg total) by mouth 2 (two) times daily. X 3 days as needed for urinary infection.   30 capsule   5   . cetirizine (ZYRTEC) 5 MG tablet   Oral   Take 5 mg by mouth daily.         . clonazePAM (KLONOPIN) 0.5 MG tablet   Oral   Take 0.5 mg by mouth 2 (two) times daily.         . colchicine 0.6 MG tablet   Oral   Take 0.6 mg by mouth daily.         . cyclobenzaprine (FLEXERIL) 10 MG tablet   Oral   Take 10 mg by mouth 2 (two) times daily.         . diphenoxylate-atropine (LOMOTIL) 2.5-0.025 MG per tablet   Oral   Take 1 tablet by mouth daily.         Marland Kitchen esomeprazole (NEXIUM) 40 MG capsule   Oral   Take 40 mg by mouth daily.         . furosemide (LASIX) 40 MG tablet   Oral   Take 80 mg by mouth every morning.         . haloperidol decanoate (HALDOL DECANOATE) 50 MG/ML injection   Intramuscular   Inject 1 mL into the muscle every 30 (thirty) days.         . hydrocortisone 1 % ointment   Topical    Apply 1 application topically 2 (two) times daily.   30 g   0   . hydrocortisone 2.5 % cream   Topical   Apply 1 application topically daily as needed. For itching of hands.         . Insulin Pen Needle (PEN NEEDLES 31GX5/16") 31G X 8 MM MISC               . ketoconazole (NIZORAL) 2 % shampoo   Topical   Apply 1 application topically every other day. Apply to scalp.         Marland Kitchen LEVEMIR FLEXTOUCH 100 UNIT/ML Pen   Subcutaneous   Inject 50-52 Units into the skin See admin instructions. 50 units subcutaneous once a day in the morning and 52 units subcutaneous once day once a day at bedtime.           Dispense as written.   . lidocaine (LIDODERM) 5 %               . LYRICA 100 MG capsule   Oral   Take 100 mg by mouth 3 (three) times daily.           Dispense as written.   . meloxicam (MOBIC) 7.5 MG tablet   Oral   Take 7.5 mg by mouth 2 (two) times daily.         . metFORMIN (GLUCOPHAGE-XR) 500 MG 24 hr tablet               . methenamine (HIPREX) 1 G tablet   Oral   Take 1 tablet (1 g total) by mouth 2 (two) times daily with a meal.   60 tablet   11   . metoprolol (LOPRESSOR) 50 MG tablet   Oral   Take 50 mg by mouth 2 (two) times daily.         . mirtazapine (REMERON) 45 MG tablet   Oral   Take 45 mg by mouth daily.         Marland Kitchen  ondansetron (ZOFRAN) 4 MG tablet   Oral   Take 1 tablet (4 mg total) by mouth every 8 (eight) hours as needed for nausea or vomiting.   12 tablet   0   . potassium chloride (K-DUR) 10 MEQ tablet               . PROAIR HFA 108 (90 BASE) MCG/ACT inhaler   Inhalation   Inhale 2 puffs into the lungs 3 (three) times daily.           Dispense as written.   . quinapril (ACCUPRIL) 20 MG tablet   Oral   Take 20 mg by mouth daily.         Marland Kitchen SPIRIVA HANDIHALER 18 MCG inhalation capsule   Inhalation   Place 18 mcg into inhaler and inhale daily.           Dispense as written.   .  sulfamethoxazole-trimethoprim (BACTRIM DS,SEPTRA DS) 800-160 MG per tablet               . TRADJENTA 5 MG TABS tablet   Oral   Take 5 mg by mouth daily.           Dispense as written.   . VOLTAREN 1 % GEL                 Dispense as written.     Allergies Codeine and Thorazine  Family History  Problem Relation Age of Onset  . CAD Mother     Social History Social History  Substance Use Topics  . Smoking status: Former Research scientist (life sciences)  . Smokeless tobacco: None  . Alcohol Use: No    Review of Systems Constitutional: No fever/chills Eyes: No visual changes. ENT: No sore throat. No stiff neck no neck pain Cardiovascular: Denies chest pain. Respiratory: Denies shortness of breath. Gastrointestinal:   no vomiting.  No diarrhea.  No constipation. Genitourinary: Negative for dysuria. Musculoskeletal: Negative lower extremity swelling Skin: Negative for rash. Neurological: Negative for headaches, focal weakness or numbness. 10-point ROS otherwise negative.  ____________________________________________   PHYSICAL EXAM:  VITAL SIGNS: ED Triage Vitals  Enc Vitals Group     BP --      Pulse --      Resp --      Temp --      Temp src --      SpO2 --      Weight 12/04/14 1736 258 lb 1.6 oz (117.073 kg)     Height 12/04/14 1736 5\' 4"  (1.626 m)     Head Cir --      Peak Flow --      Pain Score --      Pain Loc --      Pain Edu? --      Excl. in [place]? --     Constitutional: Alert and oriented. Well appearing and in no acute distress. Eyes: Conjunctivae are normal. PERRL. EOMI. Head: Atraumatic. Nose: No congestion/rhinnorhea. Mouth/Throat: Mucous membranes are moist.  Oropharynx non-erythematous. Neck: No stridor.   Nontender with no meningismus Cardiovascular: Normal rate, regular rhythm. Grossly normal heart sounds.  Good peripheral circulation. Respiratory: Normal respiratory effort.  No retractions. Lungs CTAB. Gastrointestinal: Soft and nontender. No  distention. No guarding no rebound Back:  There is no focal tenderness or step off there is no midline tenderness there are no lesions noted. there is no CVA tenderness Musculoskeletal: No lower extremity tenderness. No joint effusions, no DVT signs strong distal pulses no  edema Neurologic:  Normal speech and language. No gross focal neurologic deficits are appreciated.  Skin:  Skin is warm, dry and intact. No rash noted. Psychiatric: Mood and affect are somewhat flat. She is argumentative ____________________________________________   LABS (all labs ordered are listed, but only abnormal results are displayed)  Labs Reviewed  CBC WITH DIFFERENTIAL/PLATELET  SALICYLATE LEVEL  ETHANOL  ACETAMINOPHEN LEVEL  COMPREHENSIVE METABOLIC PANEL  PROTIME-INR  URINALYSIS COMPLETEWITH MICROSCOPIC ([place])  URINE DRUG SCREEN, QUALITATIVE ([place])   ____________________________________________  EKG  EKG interpreted by me, normal sinus rhythm rate 89 bpm no acute ST elevation or depression borderline right bundle branch noted ____________________________________________  RADIOLOGY   ____________________________________________   PROCEDURES  Procedure(s) performed: None  Critical Care performed: None  ____________________________________________   INITIAL IMPRESSION / ASSESSMENT AND PLAN / ED COURSE  Pertinent labs & imaging results that were available during my care of the patient were reviewed by me and considered in my medical decision making (see chart for details).  Patient 5 hours ago took what is likely a nonlethal dose of Aleve PM and Aleve. We will check routine blood work and watch her closely. She is awake and alert despite having she states taken this at 1:00. She denies coingestions however we'll check for Tylenol and salicylates. EKG is reassuring. Physical exam is reassuring. I will take an IVC paperwork given that she has taken an overdose in order to kill herself.  Sitter and other protocols instituted. ____________________________________________   ----------------------------------------- 7:36 PM on 12/04/2014 -----------------------------------------  Patient remains in no acute distress at this time, I have discussed with poison control they wanted 6 hours of observation on this patient after arrival, however, the closest we can get to a four-hour salicylate and acetaminophen are negative. Poison control does not feel that any further blood testing is of a utility. We will watch her closely here. I have filled out IVC paperwork  FINAL CLINICAL IMPRESSION(S) / ED DIAGNOSES  Final diagnoses:  None     Schuyler Amor, MD 12/04/14 1805  Schuyler Amor, MD 12/04/14 [phone]

## 2014-12-05 ENCOUNTER — Inpatient Hospital Stay
Admit: 2014-12-05 | Discharge: 2014-12-07 | DRG: 885 | Disposition: A | Discharge status: Home / Self Care | Payer: Medicare Other | Source: Ambulatory Visit | Attending: Psychiatry | Admitting: Psychiatry

## 2014-12-05 DIAGNOSIS — J449 Chronic obstructive pulmonary disease, unspecified: Secondary | ICD-10-CM | POA: Diagnosis present

## 2014-12-05 DIAGNOSIS — Z8249 Family history of ischemic heart disease and other diseases of the circulatory system: Secondary | ICD-10-CM | POA: Diagnosis not present

## 2014-12-05 DIAGNOSIS — J45909 Unspecified asthma, uncomplicated: Secondary | ICD-10-CM | POA: Diagnosis present

## 2014-12-05 DIAGNOSIS — Z8673 Personal history of transient ischemic attack (TIA), and cerebral infarction without residual deficits: Secondary | ICD-10-CM

## 2014-12-05 DIAGNOSIS — Z79899 Other long term (current) drug therapy: Secondary | ICD-10-CM | POA: Diagnosis not present

## 2014-12-05 DIAGNOSIS — Z9049 Acquired absence of other specified parts of digestive tract: Secondary | ICD-10-CM

## 2014-12-05 DIAGNOSIS — Z7982 Long term (current) use of aspirin: Secondary | ICD-10-CM

## 2014-12-05 DIAGNOSIS — Z9851 Tubal ligation status: Secondary | ICD-10-CM

## 2014-12-05 DIAGNOSIS — K580 Irritable bowel syndrome with diarrhea: Secondary | ICD-10-CM | POA: Diagnosis present

## 2014-12-05 DIAGNOSIS — Z87440 Personal history of urinary (tract) infections: Secondary | ICD-10-CM | POA: Diagnosis not present

## 2014-12-05 DIAGNOSIS — I110 Hypertensive heart disease with heart failure: Secondary | ICD-10-CM | POA: Diagnosis present

## 2014-12-05 DIAGNOSIS — I509 Heart failure, unspecified: Secondary | ICD-10-CM | POA: Diagnosis present

## 2014-12-05 DIAGNOSIS — E119 Type 2 diabetes mellitus without complications: Secondary | ICD-10-CM

## 2014-12-05 DIAGNOSIS — Z7951 Long term (current) use of inhaled steroids: Secondary | ICD-10-CM | POA: Diagnosis not present

## 2014-12-05 DIAGNOSIS — Z915 Personal history of self-harm: Secondary | ICD-10-CM | POA: Diagnosis not present

## 2014-12-05 DIAGNOSIS — K219 Gastro-esophageal reflux disease without esophagitis: Secondary | ICD-10-CM | POA: Diagnosis present

## 2014-12-05 DIAGNOSIS — I10 Essential (primary) hypertension: Secondary | ICD-10-CM | POA: Diagnosis present

## 2014-12-05 DIAGNOSIS — Z87891 Personal history of nicotine dependence: Secondary | ICD-10-CM | POA: Diagnosis not present

## 2014-12-05 DIAGNOSIS — M1990 Unspecified osteoarthritis, unspecified site: Secondary | ICD-10-CM | POA: Diagnosis present

## 2014-12-05 DIAGNOSIS — F250 Schizoaffective disorder, bipolar type: Principal | ICD-10-CM

## 2014-12-05 DIAGNOSIS — R339 Retention of urine, unspecified: Secondary | ICD-10-CM | POA: Diagnosis present

## 2014-12-05 DIAGNOSIS — R2681 Unsteadiness on feet: Secondary | ICD-10-CM | POA: Diagnosis present

## 2014-12-05 DIAGNOSIS — E669 Obesity, unspecified: Secondary | ICD-10-CM | POA: Diagnosis present

## 2014-12-05 DIAGNOSIS — Z888 Allergy status to other drugs, medicaments and biological substances status: Secondary | ICD-10-CM

## 2014-12-05 DIAGNOSIS — Z794 Long term (current) use of insulin: Secondary | ICD-10-CM | POA: Diagnosis not present

## 2014-12-05 DIAGNOSIS — G4730 Sleep apnea, unspecified: Secondary | ICD-10-CM | POA: Diagnosis present

## 2014-12-05 LAB — GLUCOSE, CAPILLARY
Glucose-Capillary: 189 mg/dL — ABNORMAL HIGH (ref 65–99)
Glucose-Capillary: 148 mg/dL — ABNORMAL HIGH (ref 65–99)
Glucose-Capillary: 220 mg/dL — ABNORMAL HIGH (ref 65–99)
Glucose-Capillary: 135 mg/dL — ABNORMAL HIGH (ref 65–99)
Glucose-Capillary: 181 mg/dL — ABNORMAL HIGH (ref 65–99)

## 2014-12-05 MED ORDER — PREGABALIN 50 MG PO CAPS: 100.0000 mg | ORAL_CAPSULE | Freq: Three times a day (TID) | ORAL | Status: DC | PRN

## 2014-12-05 MED ORDER — FUROSEMIDE 20 MG PO TABS
80.0000 mg | ORAL_TABLET | Freq: Every day | ORAL | Status: DC
  Administered 2014-12-05 – 2014-12-07 (×3): 80 mg via ORAL
  Filled 2014-12-05 (×3): qty 4

## 2014-12-05 MED ORDER — CYCLOBENZAPRINE HCL 10 MG PO TABS
10.0000 mg | ORAL_TABLET | Freq: Two times a day (BID) | ORAL | Status: DC
  Administered 2014-12-05 – 2014-12-07 (×5): 10 mg via ORAL
  Filled 2014-12-05 (×5): qty 1

## 2014-12-05 MED ORDER — LIDOCAINE 5 % EX PTCH
1.0000 | MEDICATED_PATCH | CUTANEOUS | Status: DC
  Administered 2014-12-05 – 2014-12-07 (×3): 1 via TRANSDERMAL
  Filled 2014-12-05 (×3): qty 1

## 2014-12-05 MED ORDER — TRAZODONE HCL 100 MG PO TABS
100.0000 mg | ORAL_TABLET | Freq: Every evening | ORAL | Status: DC | PRN
Start: 2014-12-05 — End: 2014-12-07

## 2014-12-05 MED ORDER — PANTOPRAZOLE SODIUM 40 MG PO TBEC
40.0000 mg | DELAYED_RELEASE_TABLET | Freq: Every day | ORAL | Status: DC
  Administered 2014-12-05 – 2014-12-07 (×3): 40 mg via ORAL
  Filled 2014-12-05 (×3): qty 1

## 2014-12-05 MED ORDER — QUINAPRIL HCL 10 MG PO TABS
20.0000 mg | ORAL_TABLET | Freq: Every day | ORAL | Status: DC
Start: 2014-12-05 — End: 2014-12-05

## 2014-12-05 MED ORDER — MIRTAZAPINE 30 MG PO TABS
45.0000 mg | ORAL_TABLET | Freq: Every day | ORAL | Status: DC
  Administered 2014-12-05 – 2014-12-06 (×2): 45 mg via ORAL
  Filled 2014-12-05 (×2): qty 1

## 2014-12-05 MED ORDER — MAGNESIUM HYDROXIDE 400 MG/5ML PO SUSP: 30.0000 mL | Freq: Every day | ORAL | Status: DC | PRN

## 2014-12-05 MED ORDER — DICLOFENAC SODIUM 1 % TD GEL: 2.0000 g | Freq: Four times a day (QID) | TRANSDERMAL | Status: DC | PRN

## 2014-12-05 MED ORDER — LORATADINE 10 MG PO TABS
10.0000 mg | ORAL_TABLET | Freq: Every day | ORAL | Status: DC
  Administered 2014-12-05 – 2014-12-07 (×3): 10 mg via ORAL
  Filled 2014-12-05 (×3): qty 1

## 2014-12-05 MED ORDER — ATORVASTATIN CALCIUM 20 MG PO TABS
20.0000 mg | ORAL_TABLET | Freq: Every day | ORAL | Status: DC
  Administered 2014-12-05 – 2014-12-06 (×2): 20 mg via ORAL
  Filled 2014-12-05 (×2): qty 1

## 2014-12-05 MED ORDER — ACETAMINOPHEN 325 MG PO TABS: 650.0000 mg | ORAL_TABLET | Freq: Four times a day (QID) | ORAL | Status: DC | PRN

## 2014-12-05 MED ORDER — ALUM & MAG HYDROXIDE-SIMETH 200-200-20 MG/5ML PO SUSP: 30.0000 mL | ORAL | Status: DC | PRN

## 2014-12-05 MED ORDER — INSULIN ASPART 100 UNIT/ML ~~LOC~~ SOLN
0.0000 [IU] | Freq: Three times a day (TID) | SUBCUTANEOUS | Status: DC
  Administered 2014-12-05: 3 [IU] via SUBCUTANEOUS
  Administered 2014-12-05 (×2): 1 [IU] via SUBCUTANEOUS
  Administered 2014-12-06: 2 [IU] via SUBCUTANEOUS
  Administered 2014-12-06: 3 [IU] via SUBCUTANEOUS
  Administered 2014-12-06 – 2014-12-07 (×2): 2 [IU] via SUBCUTANEOUS
  Administered 2014-12-07: 1 [IU] via SUBCUTANEOUS
  Filled 2014-12-05 (×3): qty 1
  Filled 2014-12-05: qty 2
  Filled 2014-12-05: qty 1
  Filled 2014-12-05: qty 3

## 2014-12-05 MED ORDER — METOPROLOL TARTRATE 25 MG PO TABS
50.0000 mg | ORAL_TABLET | Freq: Two times a day (BID) | ORAL | Status: DC
  Administered 2014-12-05 – 2014-12-06 (×2): 50 mg via ORAL
  Filled 2014-12-05 (×5): qty 2

## 2014-12-05 MED ORDER — MOMETASONE FURO-FORMOTEROL FUM 100-5 MCG/ACT IN AERO
2.0000 | INHALATION_SPRAY | Freq: Two times a day (BID) | RESPIRATORY_TRACT | Status: DC
  Administered 2014-12-05 – 2014-12-07 (×4): 2 via RESPIRATORY_TRACT
  Filled 2014-12-05 (×2): qty 8.8

## 2014-12-05 MED ORDER — INSULIN DETEMIR 100 UNIT/ML ~~LOC~~ SOLN
50.0000 [IU] | Freq: Two times a day (BID) | SUBCUTANEOUS | Status: DC
  Administered 2014-12-05 – 2014-12-07 (×5): 50 [IU] via SUBCUTANEOUS
  Filled 2014-12-05 (×10): qty 0.5

## 2014-12-05 MED ORDER — LINAGLIPTIN 5 MG PO TABS
5.0000 mg | ORAL_TABLET | Freq: Every day | ORAL | Status: DC
  Administered 2014-12-05 – 2014-12-07 (×3): 5 mg via ORAL
  Filled 2014-12-05 (×3): qty 1

## 2014-12-05 MED ORDER — INSULIN ASPART 100 UNIT/ML ~~LOC~~ SOLN: 0.0000 [IU] | Freq: Every day | SUBCUTANEOUS | Status: DC

## 2014-12-05 MED ORDER — ALBUTEROL SULFATE (2.5 MG/3ML) 0.083% IN NEBU: 3.0000 mL | INHALATION_SOLUTION | Freq: Four times a day (QID) | RESPIRATORY_TRACT | Status: DC | PRN

## 2014-12-05 MED ORDER — MELOXICAM 15 MG PO TABS
7.5000 mg | ORAL_TABLET | Freq: Two times a day (BID) | ORAL | Status: DC
  Administered 2014-12-05 – 2014-12-07 (×4): 7.5 mg via ORAL
  Filled 2014-12-05 (×6): qty 1

## 2014-12-05 MED ORDER — METFORMIN HCL ER 500 MG PO TB24
1000.0000 mg | ORAL_TABLET | Freq: Two times a day (BID) | ORAL | Status: DC
  Administered 2014-12-05 – 2014-12-07 (×5): 1000 mg via ORAL
  Filled 2014-12-05 (×5): qty 2

## 2014-12-05 MED ORDER — LISINOPRIL 20 MG PO TABS
20.0000 mg | ORAL_TABLET | Freq: Every day | ORAL | Status: DC
  Administered 2014-12-05: 20 mg via ORAL
  Filled 2014-12-05 (×3): qty 1

## 2014-12-05 MED ORDER — ASPIRIN EC 81 MG PO TBEC
81.0000 mg | DELAYED_RELEASE_TABLET | Freq: Every day | ORAL | Status: DC
  Administered 2014-12-05 – 2014-12-07 (×3): 81 mg via ORAL
  Filled 2014-12-05 (×3): qty 1

## 2014-12-05 MED ORDER — TIOTROPIUM BROMIDE MONOHYDRATE 18 MCG IN CAPS
18.0000 ug | ORAL_CAPSULE | Freq: Every day | RESPIRATORY_TRACT | Status: DC
  Administered 2014-12-05 – 2014-12-07 (×3): 18 ug via RESPIRATORY_TRACT
  Filled 2014-12-05 (×2): qty 5

## 2014-12-05 NOTE — Progress Notes (Signed)
CBG on arrival is 189, did not get Levemir 50 Units per Pharmacist Advice. CBG=148 this morning. Did not sleep due to late arrival to unit, remains on 1:1 for safety and ADL assist.

## 2014-12-05 NOTE — Tx Team (Signed)
Initial Interdisciplinary Treatment Plan   PATIENT STRESSORS: Financial difficulties Health problems   PATIENT STRENGTHS: Ability for insight Average or above average intelligence Capable of independent living Communication skills Motivation for treatment/growth   PROBLEM LIST: Problem List/Patient Goals Date to be addressed Date deferred Reason deferred Estimated date of resolution  Suicidal Attempts/Thoughts 12/05/2014     Depressive Symptoms 12/05/2014                                                DISCHARGE CRITERIA:  Adequate post-discharge living arrangements Improved stabilization in mood, thinking, and/or behavior Motivation to continue treatment in a less acute level of care Need for constant or close observation no longer present Safe-care adequate arrangements made  PRELIMINARY DISCHARGE PLAN: Attend aftercare/continuing care group Outpatient therapy Placement in alternative living arrangements  PATIENT/FAMIILY INVOLVEMENT: This treatment plan has been presented to and reviewed with the patient, Cristina [name], and/or family member.  The patient and family have been given the opportunity to ask questions and make suggestions.  Cristina [name] T Korion Cuevas 12/05/2014, 5:34 AM

## 2014-12-05 NOTE — Progress Notes (Signed)
   12/05/14 1000  Clinical Encounter Type  Visited With Patient  Visit Type Initial;Psychological support;Spiritual support  Referral From Nurse  Consult/Referral To Chaplain  Spiritual Encounters  Spiritual Needs Prayer;Emotional  Stress Factors  Patient Stress Factors Exhausted;Family relationships;Health changes  Met w/patient and provided pastoral care & prayer. Chap. Garan Frappier G. Surrency

## 2014-12-05 NOTE — BHH Group Notes (Signed)
[facility] LCSW Aftercare Discharge Planning Group Note   12/05/2014 12:48 PM  Patient did not attend.  Christa See, Clinical Social Work Intern 12/05/14  Carmell Austria, MSW, Latanya Presser 12/06/14

## 2014-12-05 NOTE — Progress Notes (Addendum)
Ms Cristina [name] is a 54 yo AAF admitted from [facility] Ed for attempted suicide by intentional drug overdose on her medications "I just want to sleep and not to wake up again; I am tired of I&O Catheter twice a day, the doctor told me that my bladder is not working, I prefer to have HD, I have UTI .Marland Kitchen.." History of Schizoaffective d/o, monitored by [facility], SW-Roberts Linens @ [phone],  IVC by her daughter, Sharen Hones [phone]. Has h/o multiple medical problems, requires assistance with ADLs. Patient searched by 2 female nursing staffs on admission for contrabands to ensure a safe and therapeutic milieu for all, no paraphilia or any other contrabands found on patient or in the belongings. Skin Assessment completed and documented.
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
## 2014-12-05 NOTE — Plan of Care (Signed)
Problem: Ineffective individual coping Goal: STG: Pt will be able to identify effective and ineffective STG: Pt will be able to identify effective and ineffective coping patterns  Outcome: Progressing Attending groups on unit

## 2014-12-05 NOTE — Progress Notes (Signed)
Recreation Therapy Notes  Date: 10.19.16 Time: 3:00 pm Location: Craft Room  Group Topic: Self-esteem  Goal Area(s) Addresses:  Patient will identify positive attributes about self. Patient will identify at least one healthy coping skill.  Behavioral Response: Did not attend  Intervention: All About Me  Activity: Patients were instructed to make an All About Me pamphlet listing their life's motto, positive traits, healthy coping skills, and their healthy support system.  Education: LRT educated patient on ways they can increase their self-esteem   Education Outcome: Patient did not attend group.   Clinical Observations/Feedback: Patient did not attend group.  Leonette Monarch, LRT/CTRS 12/05/2014 4:20 PM

## 2014-12-05 NOTE — BHH Group Notes (Signed)
[facility] Group Notes:  (Nursing/MHT/Case Management/Adjunct)  Date:  12/05/2014  Time:  2:41 PM  Type of Therapy:  Psychoeducational Skills  Participation Level:  Active  Participation Quality:  Appropriate, Attentive and Sharing  Affect:  Appropriate  Cognitive:  Alert and Appropriate  Insight:  Appropriate and Good  Engagement in Group:  Engaged  Modes of Intervention:  Discussion, Education and Support  Summary of Progress/Problems:  Cristina [name] [name] 12/05/2014, 2:41 PM

## 2014-12-05 NOTE — Progress Notes (Signed)
D: Patient stated slept poor last night .Stated appetite is fair and energy level  Is lowl. Stated concentration is good . Stated on Depression scale 5 , hopeless 8 and anxiety 8 .( low 0-10 high) Denies suicidal  homicidal ideations  .  No auditory hallucinations  No pain concerns . Appropriate ADL'S. Interacting with peers and staff.  Attending unit programming  A: Encourage patient participation with unit programming . Instruction  Given on  Medication , verbalize understanding. R: Voice no other concerns. Staff continue to monitor

## 2014-12-05 NOTE — H&P (Signed)
Psychiatric Admission Assessment Adult  [name] Identification: Cristina [name] MRN:  [id number] Date of Evaluation:  12/05/2014 Chief Complaint:  Schizoaffective disorder Principal Diagnosis: Schizoaffective disorder, bipolar type ([place]) Diagnosis:   [name] Active Problem List   Diagnosis Date Noted  . Schizoaffective disorder, bipolar type ([place]) [F25.0] 12/05/2014  . COPD bronchitis [J44.9] 12/05/2014  . HTN (hypertension) [I10] 12/05/2014  . Sleep apnea [G47.30] 12/05/2014  . Diabetes ([place]) [E11.9] 12/05/2014  . Incomplete bladder emptying [R33.9] 12/04/2014  . Recurrent UTI [N39.0] 12/04/2014  . Chest pain [R07.9] 09/23/2014   History of Present Illness:  Identifying data. Cristina [name] is a 54 year old female with a history of schizoaffective disorder.  Chief complaint. "I do not know."  History of present illness. Cristina [name] has a long history of mental illness with depression and mood instability. She has been maintained successfully on a combination of Haldol Decanoate and for many years avoiding hospitalization. She is in Cristina care of Dr. Channing Mutters from [facility]. Cristina [name] reports good treatment compliance and no symptoms of depression anxiety or psychosis until Cristina day of admission. On Cristina day of admission she had an appointment with her urologist for incontinence and was told that she has to catheter herself twice a day for bladder urine retention resulting in incontinence. Cristina [name] returned home and overdosed on over-Cristina-counter medicines. She is not glad to be alive. She cannot bear to follow Cristina instruction. She finds it painful when found by Cristina nurse. She is unable to do with herself due to her obesity. At present she denies symptoms of depression anxiety or psychosis. She does feel down and not knowing what's going to happen. She does not want to wear diapers. There was suggestion from her daughter that Cristina [name] may need to be placed in assisted living facility. Cristina  [name] adamantly refuses. She denies symptoms suggestive of bipolar mania. She denies alcohol or illicit substance use.   Past psychiatric history. There is a long history of mental illness. She has diagnosis of bipolar, schizophrenia, manic depressive. She's been tried on numerous medications but finds Haldol most useful. It does not worsen her diabetes. She worries that injection does not last for weeks as expected. She had multiple psychiatric hospitalizations in Cristina past but not in recent years. She had 1 suicide attempt by overdose long time ago.  Family psychiatric history. Nonreported.  Social history. She lives by herself and receives home health from home health. She believes that she can still manage. She uses a walker at home and is rather unsteady on her feet. She has a daughter who is been involved and helpful.  Past Psychiatric History: long history of depression and mood instability.  Risk to Self: Is [name] at risk for suicide?: Yes Risk to Others:   Prior Inpatient Therapy:   Prior Outpatient Therapy:    Alcohol Screening: [name] refused Alcohol Screening Tool: Yes 1. How often do you have a drink containing alcohol?: Never 2. How many drinks containing alcohol do you have on a typical day when you are drinking?: 1 or 2 3. How often do you have six or more drinks on one occasion?: Never Preliminary Score: 0 4. How often during Cristina last year have you found that you were not able to stop drinking once you had started?: Never 5. How often during Cristina last year have you failed to do what was normally expected from you becasue of drinking?: Never 6. How often during Cristina last year have you needed a first  drink in Cristina morning to get yourself going after a heavy drinking session?: Never 7. How often during Cristina last year have you had a feeling of guilt of remorse after drinking?: Never 8. How often during Cristina last year have you been unable to remember what happened Cristina night before  because you had been drinking?: Never 9. Have you or someone else been injured as a result of your drinking?: No 10. Has a relative or friend or a doctor or another health worker been concerned about your drinking or suggested you cut down?: No Alcohol Use Disorder Identification Test Final Score (AUDIT): 0 Brief Intervention: AUDIT score less than 7 or less-screening does not suggest unhealthy drinking-brief intervention not indicated (No longer drinks) Substance Abuse History in Cristina last 12 months:  No. Consequences of Substance Abuse: NA Previous Psychotropic Medications: Yes  Psychological Evaluations: No. Past Medical History:  Past Medical History  Diagnosis Date  . Asthma   . CHF (congestive heart failure) ([place])   . Diabetes mellitus without complication ([place])   . History of cholecystectomy     hx of cholecystectomy  . H/O tubal ligation     hx btl  . Hypertension   . Stroke ([place])   . Abdominal hernia   . Fatty tumor   . Sleep apnea   . COPD (chronic obstructive pulmonary disease) ([place])   . H/O colonoscopy   . Bladder dysfunction   . Muscle cramps   . Vaginitis   . Recurrent boils   . Dyspnea   . Edema   . DDD (degenerative disc disease), lumbar   . UTI (lower urinary tract infection)   . Carpal tunnel syndrome   . Neuropathy ([place])   . Obesity   . Arthritis   . Schizoaffective disorder ([place])   . GERD (gastroesophageal reflux disease)   . HLD (hyperlipidemia)     Past Surgical History  Procedure Laterality Date  . Cholecystectomy    . Tubal ligation    . Cardiac catheterization Left 09/24/2014    Procedure: Left Heart Cath and Coronary Angiography;  Surgeon: Dionisio David, MD;  Location: [place] [facility];  Service: Cardiovascular;  Laterality: Left;   Family History:  Family History  Problem Relation Age of Onset  . CAD Mother    Family Psychiatric  History: none reported. Social History:  History  Alcohol Use No     History  Drug Use No     Social History   Social History  . Marital Status: Single    Spouse Name: N/A  . Number of Children: N/A  . Years of Education: N/A   Social History Main Topics  . Smoking status: Former Research scientist (life sciences)  . Smokeless tobacco: None  . Alcohol Use: No  . Drug Use: No  . Sexual Activity: Not Asked   Other Topics Concern  . None   Social History Narrative   Additional Social History:                         Allergies:   Allergies  Allergen Reactions  . Shellfish-Derived Products Anaphylaxis and Shortness Of Breath    Stated by [name]  . Thorazine [Chlorpromazine] Swelling  . Tylenol With Codeine #3 [Acetaminophen-Codeine] Other (See Comments)    Hallucinations   Lab Results:  Results for orders placed or performed during Cristina hospital encounter of 12/05/14 (from Cristina past 48 hour(s))  Glucose, capillary     Status: Abnormal  Collection Time: 12/05/14  2:50 AM  Result Value Ref Range   Glucose-Capillary 189 (H) 65 - 99 mg/dL   Comment 1 Notify RN   Glucose, capillary     Status: Abnormal   Collection Time: 12/05/14  6:23 AM  Result Value Ref Range   Glucose-Capillary 148 (H) 65 - 99 mg/dL  Glucose, capillary     Status: Abnormal   Collection Time: 12/05/14 11:49 AM  Result Value Ref Range   Glucose-Capillary 220 (H) 65 - 99 mg/dL   Comment 1 Notify RN     Metabolic Disorder Labs:  Lab Results  Component Value Date   HGBA1C 8.0* 09/23/2014   No results found for: PROLACTIN Lab Results  Component Value Date   CHOL 158 09/24/2014   TRIG 260* 09/24/2014   HDL 38* 09/24/2014   CHOLHDL 4.2 09/24/2014   VLDL 52* 09/24/2014   LDLCALC 68 09/24/2014    Current Medications: Current Facility-Administered Medications  Medication Dose Route Frequency Provider Last Rate Last Dose  . acetaminophen (TYLENOL) tablet 650 mg  650 mg Oral Q6H PRN Hildred Priest, MD      . albuterol (PROVENTIL) (2.5 MG/3ML) 0.083% nebulizer solution 3 mL  3 mL Inhalation Q6H  PRN Hildred Priest, MD      . alum & mag hydroxide-simeth (MAALOX/MYLANTA) 200-200-20 MG/5ML suspension 30 mL  30 mL Oral Q4H PRN Hildred Priest, MD      . aspirin EC tablet 81 mg  81 mg Oral Daily Hildred Priest, MD   81 mg at 12/05/14 0849  . atorvastatin (LIPITOR) tablet 20 mg  20 mg Oral QHS Hildred Priest, MD      . cyclobenzaprine (FLEXERIL) tablet 10 mg  10 mg Oral BID Hildred Priest, MD   10 mg at 12/05/14 0852  . diclofenac sodium (VOLTAREN) 1 % transdermal gel 2 g  2 g Topical QID PRN Hildred Priest, MD      . furosemide (LASIX) tablet 80 mg  80 mg Oral Daily Hildred Priest, MD   80 mg at 12/05/14 0852  . insulin aspart (novoLOG) injection 0-5 Units  0-5 Units Subcutaneous QHS Hildred Priest, MD      . insulin aspart (novoLOG) injection 0-9 Units  0-9 Units Subcutaneous TID WC Hildred Priest, MD   3 Units at 12/05/14 1225  . insulin detemir (LEVEMIR) injection 50 Units  50 Units Subcutaneous BID Hildred Priest, MD   50 Units at 12/05/14 [phone]  . lidocaine (LIDODERM) 5 % 1 patch  1 patch Transdermal Q24H Hildred Priest, MD   1 patch at 12/05/14 [phone]  . linagliptin (TRADJENTA) tablet 5 mg  5 mg Oral Daily Hildred Priest, MD   5 mg at 12/05/14 0849  . lisinopril (PRINIVIL,ZESTRIL) tablet 20 mg  20 mg Oral Daily Hildred Priest, MD   20 mg at 12/05/14 0851  . loratadine (CLARITIN) tablet 10 mg  10 mg Oral Daily Hildred Priest, MD   10 mg at 12/05/14 0850  . magnesium hydroxide (MILK OF MAGNESIA) suspension 30 mL  30 mL Oral Daily PRN Hildred Priest, MD      . meloxicam [facility]) tablet 7.5 mg  7.5 mg Oral BID Hildred Priest, MD   7.5 mg at 12/05/14 0853  . metFORMIN (GLUCOPHAGE-XR) 24 hr tablet 1,000 mg  1,000 mg Oral BID WC Hildred Priest, MD   1,000 mg at 12/05/14 0829  . metoprolol tartrate (LOPRESSOR) tablet  50 mg  50 mg Oral BID Hildred Priest, MD   50 mg  at 12/05/14 0848  . mirtazapine (REMERON) tablet 45 mg  45 mg Oral QHS Hildred Priest, MD      . mometasone-formoterol [facility]) 100-5 MCG/ACT inhaler 2 puff  2 puff Inhalation BID Hildred Priest, MD   2 puff at 12/05/14 0854  . pantoprazole (PROTONIX) EC tablet 40 mg  40 mg Oral Daily Hildred Priest, MD   40 mg at 12/05/14 0850  . pregabalin (LYRICA) capsule 100 mg  100 mg Oral TID PRN Hildred Priest, MD      . tiotropium [facility]) inhalation capsule 18 mcg  18 mcg Inhalation Daily Hildred Priest, MD   18 mcg at 12/05/14 0839  . traZODone (DESYREL) tablet 100 mg  100 mg Oral QHS PRN Hildred Priest, MD       PTA Medications: Prescriptions prior to admission  Medication Sig Dispense Refill Last Dose  . albuterol (PROVENTIL HFA;VENTOLIN HFA) 108 (90 BASE) MCG/ACT inhaler Inhale 2 puffs into Cristina lungs every 6 (six) hours as needed for wheezing or shortness of breath.   Past Week at Unknown time  . aspirin EC 81 MG tablet Take 81 mg by mouth daily.   12/04/2014 at 0700  . atorvastatin (LIPITOR) 20 MG tablet Take 20 mg by mouth at bedtime.   12/04/2014 at 1400  . cetirizine (ZYRTEC) 10 MG tablet Take 10 mg by mouth daily as needed for allergies.   12/04/2014 at 1400  . clonazePAM (KLONOPIN) 0.5 MG tablet Take 0.5 mg by mouth 2 (two) times daily.   12/04/2014 at 1400  . cyclobenzaprine (FLEXERIL) 10 MG tablet Take 10 mg by mouth 2 (two) times daily.   12/04/2014 at 1400  . diclofenac sodium (VOLTAREN) 1 % GEL Apply 2 g topically 4 (four) times daily as needed (for pain).   Past Week at Unknown time  . esomeprazole (NEXIUM) 40 MG capsule Take 40 mg by mouth daily.    12/04/2014 at 0700  . Fluticasone-Salmeterol (ADVAIR) 250-50 MCG/DOSE AEPB Inhale 1 puff into Cristina lungs 2 (two) times daily.   Past Week at Unknown time  . furosemide (LASIX) 40 MG tablet Take 80 mg by mouth daily.    12/04/2014 at 1400  . insulin detemir (LEVEMIR) 100 UNIT/ML injection Inject 50-52 Units into Cristina skin 2 (two) times daily. Pt uses 50 units in Cristina morning and 52 units at bedtime.   12/04/2014 at Unknown time  . ketoconazole (NIZORAL) 2 % shampoo Apply 1 application topically every other day.   Past Week at Unknown time  . lidocaine (LIDODERM) 5 % Place 1 patch onto Cristina skin every 12 (twelve) hours. Remove & Discard patch within 12 hours or as directed by MD   Past Week at Unknown time  . linagliptin (TRADJENTA) 5 MG TABS tablet Take 5 mg by mouth daily.   Past Month at Unknown time  . meloxicam (MOBIC) 7.5 MG tablet Take 7.5 mg by mouth 2 (two) times daily.   Past Week at Unknown time  . metFORMIN (GLUCOPHAGE-XR) 500 MG 24 hr tablet Take 1,000 mg by mouth 2 (two) times daily.   12/04/2014 at 1400  . metoprolol (LOPRESSOR) 50 MG tablet Take 50 mg by mouth 2 (two) times daily.   12/04/2014 at 1400  . mirtazapine (REMERON) 45 MG tablet Take 45 mg by mouth at bedtime.   12/04/2014 at 1400  . pregabalin (LYRICA) 100 MG capsule Take 100 mg by mouth 3 (three) times daily as needed (for nerve pain).   12/04/2014 at 1400  .  quinapril (ACCUPRIL) 20 MG tablet Take 20 mg by mouth daily.   Past Week at Unknown time  . tiotropium (SPIRIVA) 18 MCG inhalation capsule Place 18 mcg into inhaler and inhale daily.   12/03/2014 at Unknown    Musculoskeletal: Strength & Muscle Tone: within normal limits Gait & Station: unsteady [name] leans: N/A  Psychiatric Specialty Exam: Physical Exam  Nursing note and vitals reviewed.   Review of Systems  Genitourinary: Positive for urgency.  All other systems reviewed and are negative.   Blood pressure 127/83, pulse 90, temperature 98.7 F (37.1 C), temperature source Oral, resp. rate 18, height 5\' 4"  (1.626 m), weight 115.214 kg (254 lb), SpO2 99 %.Body mass index is 43.58 kg/(m^2).  See SRA.                                                   Sleep:  Number of Hours: 0     Treatment Plan Summary: Daily contact with [name] to assess and evaluate symptoms and progress in treatment and Medication management   Ms. [name] is a 54 year old female with history of schizoaffective admitted after a suicide attempt by overdose in Cristina context of new medical problems.  1. Suicidal ideation. Cristina [name] is able to contract for safety.  2. Mood. She has been maintained on a combination of Remeron and Haldol injections. She tells me that she missed Haldol injection recently while hospitalized. She does not know her Haldol dose. She also thinks that medications last 2 weeks instead of 4 she is asking for more frequent Haldol injections.  3. COPD. We will continue inhalers.  4. Diabetes. We'll continue insulin, metformin, tradjenta, Lipitor, ADA diet and blood glucose monitoring.  5. Sleep apnea. She will need CPAP machine.  6. Hypertension. We'll continue lisinopril metoprolol furosemide and ASA.  7. Arthritis. We'll continue patches, Flexeril, Mobic and Lyrica.  8. Disposition. She will likely return home and follow up with her act team. Her daughter is inquiring about assisted living facility placement.  Observation Level/Precautions:  Elopement Detox Fall 1 to 1 15 minute checks  Laboratory:  CBC Chemistry Profile UDS UA  Psychotherapy:    Medications:    Consultations:    Discharge Concerns:    Estimated LOS:  Other:     I certify that inpatient services furnished can reasonably be expected to improve Cristina [name]'s condition.   Jassiah Viviano 10/19/20163:06 PM

## 2014-12-06 LAB — GLUCOSE, CAPILLARY
Glucose-Capillary: 237 mg/dL — ABNORMAL HIGH (ref 65–99)
Glucose-Capillary: 170 mg/dL — ABNORMAL HIGH (ref 65–99)
Glucose-Capillary: 173 mg/dL — ABNORMAL HIGH (ref 65–99)
Glucose-Capillary: 154 mg/dL — ABNORMAL HIGH (ref 65–99)

## 2014-12-06 MED ORDER — HALOPERIDOL DECANOATE 100 MG/ML IM SOLN
50.0000 mg | INTRAMUSCULAR | Status: DC
  Administered 2014-12-07: 50 mg via INTRAMUSCULAR
  Filled 2014-12-06 (×2): qty 0.5

## 2014-12-06 MED ORDER — LOPERAMIDE HCL 2 MG PO CAPS
4.0000 mg | ORAL_CAPSULE | ORAL | Status: DC | PRN
  Administered 2014-12-06 – 2014-12-07 (×2): 4 mg via ORAL
  Filled 2014-12-06 (×2): qty 2

## 2014-12-06 NOTE — Progress Notes (Signed)
   12/06/14 1000  Clinical Encounter Type  Visited With Patient  Visit Type Follow-up  Referral From Patient  Consult/Referral To Chaplain  Spiritual Encounters  Spiritual Needs Prayer  Stress Factors  Patient Stress Factors Health changes;Loss of control  Met w/patient to provide spiritual care & prayer.  Chap. Koula Venier [place] [place]

## 2014-12-06 NOTE — Evaluation (Signed)
Physical Therapy Evaluation Patient Details Name: Cristina [name] MRN: [id number] DOB: [birth year] Today's Date: 12/06/2014   History of Present Illness  Pt is a 54 y.o. female admitted to hospital d/t attempted suicide with drug overdose (over the counter meds); per notes pt upset d/t MD telling her she needed to in and out cath 2x/day d/t urine retention resulting in incontinence.  PMH includes stroke, carpal tunnel syndrom, L cardiac cath, h/o schizoaffective disorder  Clinical Impression  Currently pt demonstrates impairments with general strength and limitations with functional mobility d/t this.  Prior to admission, pt was modified independent with AD.  Pt lives alone in 1 level apt with level entry.  Currently pt is SBA with tranfers and CGA to SBA with ambulation with RW; pt steady without loss of balance.  Pt would benefit from skilled PT to address above noted impairments and functional limitations.  Recommend pt discharge to home when medically appropriate.     Follow Up Recommendations  (No PT follow up after discharge from hospital)    Equipment Recommendations   (pt has own RW at home)    Recommendations for Other Services       Precautions / Restrictions Precautions Precautions: Fall Restrictions Weight Bearing Restrictions: No      Mobility  Bed Mobility Overal bed mobility: Needs Assistance Bed Mobility: Supine to Sit     Supine to sit: Min assist     General bed mobility comments: assist for trunk (pt reports having something to pull-up on at home)  Transfers Overall transfer level: Needs assistance Equipment used: Rolling walker (2 wheeled) Transfers: Sit to/from [facility] Sit to Stand: Supervision Stand pivot transfers: Supervision (transfer to commode)       General transfer comment: steady without loss of balance; pt stood x10 times from bed with RW with SBA  Ambulation/Gait Ambulation/Gait assistance: Supervision;Min  guard Ambulation Distance (Feet): 150 Feet Assistive device: Rolling walker (2 wheeled)       General Gait Details: L LE toe/out & externally rotated; decreased B step length/foot clearance/heelstrike; decreased cadence; steady though  Science writer    Modified Rankin (Stroke Patients Only)       Balance Overall balance assessment: Needs assistance Sitting-balance support: No upper extremity supported;Feet supported Sitting balance-[name]: Good     Standing balance support: Bilateral upper extremity supported (on RW) Standing balance-[name]: Good                               Pertinent Vitals/Pain Pain Assessment: No/denies pain  See flowsheet for vitals.    Home Living Family/patient expects to be discharged to:: Private residence Living Arrangements: Alone   Type of Home: Apartment (1st floor) Home Access: Level entry     Home Layout: One level Home Equipment: Walker - 2 wheels;Cane - single point;Wheelchair - power      Prior Function Level of Independence: Independent with assistive device(s)         Comments: Pt uses SPC in community (but uses motorized cart in the stores for shopping) and for longer distances uses electric w/c.  Pt occasionally uses RW in home as needed.     Hand Dominance        Extremity/Trunk Assessment   Upper Extremity Assessment: Generalized weakness           Lower Extremity Assessment: Generalized weakness  Communication   Communication: No difficulties  Cognition Arousal/Alertness: Awake/alert Behavior During Therapy: WFL for tasks assessed/performed Overall Cognitive Status: Within Functional Limits for tasks assessed                      General Comments General comments (skin integrity, edema, etc.): Sitter present  Nursing cleared pt for participation in physical therapy.  Pt agreeable to PT session.    Exercises         Assessment/Plan    PT Assessment Patient needs continued PT services  PT Diagnosis Generalized weakness   PT Problem List Decreased strength;Decreased mobility;Decreased balance;Decreased activity tolerance  PT Treatment Interventions DME instruction;Gait training;Functional mobility training;Therapeutic activities;Therapeutic exercise;Balance training;Patient/family education   PT Goals (Current goals can be found in the Care Plan section) Acute Rehab PT Goals Patient Stated Goal: to go home PT Goal Formulation: With patient Time For Goal Achievement: 12/20/14 Potential to Achieve Goals: Good    Frequency Min 2X/week   Barriers to discharge        Co-evaluation               End of Session Equipment Utilized During Treatment: Gait belt Activity Tolerance: Patient tolerated treatment well Patient left: in bed;with nursing/sitter in room Nurse Communication: Mobility status         Time: 1320-1340 PT Time Calculation (min) (ACUTE ONLY): 20 min   Charges:   PT Evaluation $Initial PT Evaluation Tier I: 1 Procedure     PT G CodesLeitha Bleak 2014/12/17, 1:57 PM Leitha Bleak, [place]

## 2014-12-06 NOTE — Progress Notes (Signed)
Inpatient Diabetes Program Recommendations  AACE/ADA: New Consensus Statement on Inpatient Glycemic Control (2015)  Target Ranges:  Prepandial:   less than 140 mg/dL      Peak postprandial:   less than 180 mg/dL (1-2 hours)      Critically ill patients:  140 - 180 mg/dL   Review of Glycemic Control Results for Cristina [name], Cristina [name] (MRN [id number]) as of 12/06/2014 11:56  Ref. Range 12/05/2014 11:49 12/05/2014 17:11 12/05/2014 20:32 12/06/2014 06:50 12/06/2014 11:42  Glucose-Capillary Latest Ref Range: 65-99 mg/dL 220 (H) 135 (H) 181 (H) 237 (H) 170 (H)     Diabetes history: Type 2, A1C 8.0% on 09/23/14 Outpatient Diabetes medications: Levemir 50 units qam, 52 units qhs, Metformin 1000mg  bid, Tradgenta 5 mg/day Current orders for Inpatient glycemic control: Levemir 50 units qam, 50 units qhs, Metformin 1000mg  bid, Tradgenta 5 mg/day, Novolog 0-9 units tid with meals, Novolog 0-5 units qhs  Inpatient Diabetes Program Recommendations:  Agree with current medications for diabetes control.  Gentry Fitz, RN, BA, MHA, CDE Diabetes Coordinator Inpatient Diabetes Program  [phone] (Team Pager) [phone] ([place]) 12/06/2014 11:57 AM

## 2014-12-06 NOTE — Progress Notes (Signed)
1:1 for safety.  Pleasant and cooperative.  Denies depression, SI, HI, AVH

## 2014-12-06 NOTE — [facility] Group Notes (Signed)
[facility] Group Notes:  (Nursing/MHT/Case Management/Adjunct)  Date:  12/06/2014  Time:  3:39 PM  Type of Therapy:  Psychoeducational Skills  Participation Level:  Did Not Attend    Ducre Travis Jassica Zazueta 12/06/2014, 3:39 PM 

## 2014-12-06 NOTE — Progress Notes (Signed)
Recreation Therapy Notes  Date: 10.20.16 Time: 3:00 pm Location: Craft Room  Group Topic: Leisure Education  Goal Area(s) Addresses:  Patient will write at least one leisure activity. Patient will write a leisure goal.  Behavioral Response: Did not attend   Intervention: Leisure Bucket List  Activity: Patients were given a leisure bucket list worksheet and instructed to list different leisure activity they had not tried, but wanted to.  Education: LRT educated patients on what is needed to participate in leisure.  Education Outcome: Patient did not attend group.   Clinical Observations/Feedback: Patient did not attend group.  Leonette Monarch, LRT/CTRS 12/06/2014 4:07 PM

## 2014-12-06 NOTE — Plan of Care (Signed)
Problem: Ineffective individual coping Goal: LTG: Patient will report a decrease in negative feelings Outcome: Progressing Patient denies SI/HI.      

## 2014-12-06 NOTE — [facility] Group Notes (Signed)
[facility] LCSW Group Therapy  12/06/2014 1:54 PM  Type of Therapy:  Group Therapy  Participation Level:  Did Not Attend  Modes of Intervention:  Discussion, Education, Socialization and Support  Summary of Progress/Problems: Balance in life: Patients will discuss the concept of balance and how it looks and feels to be unbalanced. Pt will identify areas in their life that is unbalanced and ways to become more balanced.    Genaro Bekker L Bosco Paparella MSW, LCSWA  12/06/2014, 1:54 PM 

## 2014-12-06 NOTE — Progress Notes (Signed)
D: Pt denies SI/HI/AVH. Pt is pleasant and cooperative with care, patient has 1;1 at bedside for safety. Patient's affect is flat and sad, c/o earlier of Abdominal pain; was helped to the bathroom and she has a large bowel movement,  patient expressed relief.  Patient appears less anxious and  is interacting with peers and staff appropriately.  A: Pt was offered support and encouragement. Pt was given scheduled medications. Patient also c/o urine retention and a bladder scan was used to assess patient, this showed some retention. In and out catheterization was done as ordered and patient had a residual urine volume of 316ml,  Q 15 minutes checks were done for safety.  R: Pt is taking medication Pt receptive to treatment and safety maintained on unit.

## 2014-12-06 NOTE — Progress Notes (Signed)
[facility] MD Progress Note  12/06/2014 1:01 PM Cristina [name]  MRN:  [id number]  Subjective:  Cristina [name] feels much better mentally today. She is no longer depressed or anxious. She is not suicidal and is glad to be alive. She is asking for discharge. I spoke extensively with Dr. Channing Mutters her primary psychiatrist. We both believe that the patient is not able to handle twice daily catheterization necessary to avoid repeated urinary tract infections. She lives by herself. Her home health nurse is not trained to assist with catheterization. We will ask nephrology consult to see if there is an alternative solution. The patient is only 54 years old and she is unable to accept the fact that she will have to be incontinent or continue catheterization. This is a patient with the history of schizoaffective disorder that easily crumbles under pressure. Her suicide attempt just 3 hours following a visit to her nephrologist is the best example. The patient has difficulties ambulation and uses walker at home. He she has which are available and has Actuary. We asked for PT consult to assess her needs. Last night she had diarrhea which she believes is a part of her IBS.  Principal Problem: Schizoaffective disorder, bipolar type ([place]) Diagnosis:   Patient Active Problem List   Diagnosis Date Noted  . Schizoaffective disorder, bipolar type ([place]) [F25.0] 12/05/2014  . COPD bronchitis [J44.9] 12/05/2014  . HTN (hypertension) [I10] 12/05/2014  . Sleep apnea [G47.30] 12/05/2014  . Diabetes ([place]) [E11.9] 12/05/2014  . Incomplete bladder emptying [R33.9] 12/04/2014  . Recurrent UTI [N39.0] 12/04/2014  . Chest pain [R07.9] 09/23/2014   Total Time spent with patient: 30 minutes  Past Psychiatric History: Long history of schizoaffective disorder.  Past Medical History:  Past Medical History  Diagnosis Date  . Asthma   . CHF (congestive heart failure) ([place])   . Diabetes mellitus without complication ([place])   . History of  cholecystectomy     hx of cholecystectomy  . H/O tubal ligation     hx btl  . Hypertension   . Stroke ([place])   . Abdominal hernia   . Fatty tumor   . Sleep apnea   . COPD (chronic obstructive pulmonary disease) ([place])   . H/O colonoscopy   . Bladder dysfunction   . Muscle cramps   . Vaginitis   . Recurrent boils   . Dyspnea   . Edema   . DDD (degenerative disc disease), lumbar   . UTI (lower urinary tract infection)   . Carpal tunnel syndrome   . Neuropathy ([place])   . Obesity   . Arthritis   . Schizoaffective disorder ([place])   . GERD (gastroesophageal reflux disease)   . HLD (hyperlipidemia)     Past Surgical History  Procedure Laterality Date  . Cholecystectomy    . Tubal ligation    . Cardiac catheterization Left 09/24/2014    Procedure: Left Heart Cath and Coronary Angiography;  Surgeon: Dionisio David, MD;  Location: [facility];  Service: Cardiovascular;  Laterality: Left;   Family History:  Family History  Problem Relation Age of Onset  . CAD Mother    Family Psychiatric  History: Not reported. Social History:  History  Alcohol Use No     History  Drug Use No    Social History   Social History  . Marital Status: Single    Spouse Name: N/A  . Number of Children: N/A  . Years of Education: N/A   Social History Main  Topics  . Smoking status: Former Research scientist (life sciences)  . Smokeless tobacco: None  . Alcohol Use: No  . Drug Use: No  . Sexual Activity: Not Asked   Other Topics Concern  . None   Social History Narrative   Additional Social History:                         Sleep: Poor  Appetite:  Fair  Current Medications: Current Facility-Administered Medications  Medication Dose Route Frequency Provider Last Rate Last Dose  . acetaminophen (TYLENOL) tablet 650 mg  650 mg Oral Q6H PRN Hildred Priest, MD      . albuterol (PROVENTIL) (2.5 MG/3ML) 0.083% nebulizer solution 3 mL  3 mL Inhalation Q6H PRN Hildred Priest, MD       . alum & mag hydroxide-simeth (MAALOX/MYLANTA) 200-200-20 MG/5ML suspension 30 mL  30 mL Oral Q4H PRN Hildred Priest, MD      . aspirin EC tablet 81 mg  81 mg Oral Daily Hildred Priest, MD   81 mg at 12/06/14 5916  . atorvastatin (LIPITOR) tablet 20 mg  20 mg Oral QHS Hildred Priest, MD   20 mg at 12/05/14 2213  . cyclobenzaprine (FLEXERIL) tablet 10 mg  10 mg Oral BID Hildred Priest, MD   10 mg at 12/06/14 0925  . diclofenac sodium (VOLTAREN) 1 % transdermal gel 2 g  2 g Topical QID PRN Hildred Priest, MD      . furosemide (LASIX) tablet 80 mg  80 mg Oral Daily Hildred Priest, MD   80 mg at 12/06/14 3846  . insulin aspart (novoLOG) injection 0-5 Units  0-5 Units Subcutaneous QHS Hildred Priest, MD   0 Units at 12/05/14 2214  . insulin aspart (novoLOG) injection 0-9 Units  0-9 Units Subcutaneous TID WC Hildred Priest, MD   2 Units at 12/06/14 1144  . insulin detemir (LEVEMIR) injection 50 Units  50 Units Subcutaneous BID Hildred Priest, MD   50 Units at 12/06/14 [phone]  . lidocaine (LIDODERM) 5 % 1 patch  1 patch Transdermal Q24H Hildred Priest, MD   1 patch at 12/06/14 [phone]  . linagliptin (TRADJENTA) tablet 5 mg  5 mg Oral Daily Hildred Priest, MD   5 mg at 12/06/14 1779  . lisinopril (PRINIVIL,ZESTRIL) tablet 20 mg  20 mg Oral Daily Hildred Priest, MD   20 mg at 12/05/14 0851  . loratadine (CLARITIN) tablet 10 mg  10 mg Oral Daily Hildred Priest, MD   10 mg at 12/06/14 1000  . magnesium hydroxide (MILK OF MAGNESIA) suspension 30 mL  30 mL Oral Daily PRN Hildred Priest, MD      . meloxicam [facility]) tablet 7.5 mg  7.5 mg Oral BID Hildred Priest, MD   7.5 mg at 12/06/14 3903  . metFORMIN (GLUCOPHAGE-XR) 24 hr tablet 1,000 mg  1,000 mg Oral BID WC Hildred Priest, MD   1,000 mg at 12/06/14 0924  . metoprolol tartrate (LOPRESSOR)  tablet 50 mg  50 mg Oral BID Hildred Priest, MD   50 mg at 12/05/14 0848  . mirtazapine (REMERON) tablet 45 mg  45 mg Oral QHS Hildred Priest, MD   45 mg at 12/05/14 2212  . mometasone-formoterol (DULERA) 100-5 MCG/ACT inhaler 2 puff  2 puff Inhalation BID Hildred Priest, MD   2 puff at 12/06/14 [phone]  . pantoprazole (PROTONIX) EC tablet 40 mg  40 mg Oral Daily Hildred Priest, MD   40 mg at 12/06/14 0925  . pregabalin (LYRICA)  capsule 100 mg  100 mg Oral TID PRN Hildred Priest, MD      . tiotropium [facility]) inhalation capsule 18 mcg  18 mcg Inhalation Daily Hildred Priest, MD   18 mcg at 12/06/14 7253  . traZODone (DESYREL) tablet 100 mg  100 mg Oral QHS PRN Hildred Priest, MD        Lab Results:  Results for orders placed or performed during the hospital encounter of 12/05/14 (from the past 48 hour(s))  Glucose, capillary     Status: Abnormal   Collection Time: 12/05/14  2:50 AM  Result Value Ref Range   Glucose-Capillary 189 (H) 65 - 99 mg/dL   Comment 1 Notify RN   Glucose, capillary     Status: Abnormal   Collection Time: 12/05/14  6:23 AM  Result Value Ref Range   Glucose-Capillary 148 (H) 65 - 99 mg/dL  Glucose, capillary     Status: Abnormal   Collection Time: 12/05/14 11:49 AM  Result Value Ref Range   Glucose-Capillary 220 (H) 65 - 99 mg/dL   Comment 1 Notify RN   Glucose, capillary     Status: Abnormal   Collection Time: 12/05/14  5:11 PM  Result Value Ref Range   Glucose-Capillary 135 (H) 65 - 99 mg/dL   Comment 1 Notify RN   Glucose, capillary     Status: Abnormal   Collection Time: 12/05/14  8:32 PM  Result Value Ref Range   Glucose-Capillary 181 (H) 65 - 99 mg/dL  Glucose, capillary     Status: Abnormal   Collection Time: 12/06/14  6:50 AM  Result Value Ref Range   Glucose-Capillary 237 (H) 65 - 99 mg/dL   Comment 1 Notify RN   Glucose, capillary     Status: Abnormal   Collection Time:  12/06/14 11:42 AM  Result Value Ref Range   Glucose-Capillary 170 (H) 65 - 99 mg/dL    Physical Findings: AIMS:  , ,  ,  ,    CIWA:    COWS:     Musculoskeletal: Strength & Muscle Tone: decreased Gait & Station: unsteady Patient leans: N/A  Psychiatric Specialty Exam: Review of Systems  Gastrointestinal: Positive for diarrhea.  Genitourinary: Positive for urgency.  All other systems reviewed and are negative.   Blood pressure 91/61, pulse 118, temperature 98.5 F (36.9 C), temperature source Oral, resp. rate 16, height 5\' 4"  (1.626 m), weight 115.214 kg (254 lb), SpO2 99 %.Body mass index is 43.58 kg/(m^2).  General Appearance: Casual  Eye Contact::  Good  Speech:  Clear and Coherent  Volume:  Normal  Mood:  Euthymic  Affect:  Appropriate  Thought Process:  Goal Directed  Orientation:  Full (Time, Place, and Person)  Thought Content:  WDL  Suicidal Thoughts:  No  Homicidal Thoughts:  No  Memory:  Immediate;   Fair Recent;   Fair Remote;   Fair  Judgement:  Impaired  Insight:  Fair  Psychomotor Activity:  Decreased  Concentration:  Fair  Recall:  [employer] of Knowledge:Fair  Language: Fair  Akathisia:  No  Handed:  Right  AIMS (if indicated):     Assets:  Communication Skills Desire for Improvement Financial Resources/Insurance Housing Resilience Social Support  ADL's:  Intact  Cognition: WNL  Sleep:  Number of Hours: 4   Treatment Plan Summary: Daily contact with patient to assess and evaluate symptoms and progress in treatment and Medication management   Cristina [name] is a 54 year old female with history of schizoaffective  admitted after a suicide attempt by overdose in the context of new medical problems.  1. Suicidal ideation. This has resolved. The patient is able to contract for safety.  2. Mood. She has been maintained on a combination of Remeron and Haldol injections. Her Haldol injection is due today. Will order 50 mg of Haldol Decanoate.   3.  COPD. We continue inhalers.  4. Diabetes. We continue insulin, metformin, tradjenta, Lipitor, ADA diet and blood glucose monitoring.  5. Sleep apnea. She uses CPAP machine.  6. Hypertension. We continue lisinopril metoprolol furosemide and ASA.  7. Arthritis. We continue patches, Flexeril, Mobic and Lyrica.  8. Diarrhea. Will order loperamid as needed.  9. Urinary retention. The patient has been Advised twice daily. Last night there was 350 cc of retained urine. We will ask urology consult to see if a better treatment plan can be developed for this unfortunate patient.   10.  Disposition. She will likely return home and follow up with her  t he ACT team. Her daughter is favor of assisted living facility placement of her acting believes that the patient will continue while in the community if we can resolve the urology problem.    Cristina [name] 12/06/2014, 1:01 PM

## 2014-12-07 LAB — GLUCOSE, CAPILLARY
Glucose-Capillary: 142 mg/dL — ABNORMAL HIGH (ref 65–99)
Glucose-Capillary: 87 mg/dL (ref 65–99)

## 2014-12-07 MED ORDER — HALOPERIDOL DECANOATE 100 MG/ML IM SOLN: 50.0000 mg | INTRAMUSCULAR | Status: DC

## 2014-12-07 MED ORDER — TRAZODONE HCL 100 MG PO TABS: 100.0000 mg | ORAL_TABLET | Freq: Every evening | ORAL | Status: DC | PRN

## 2014-12-07 NOTE — BHH Group Notes (Signed)
[facility] Group Notes:  (Nursing/MHT/Case Management/Adjunct)  Date:  12/07/2014  Time:  5:51 AM  Type of Therapy:  Group Therapy  Participation Level:  Did Not Attend    Shandora Koogler Joy Tacia Hindley 12/07/2014, 5:51 AM

## 2014-12-07 NOTE — BHH Group Notes (Signed)
[place]  Group Notes:  (Nursing/MHT/Case Management/Adjunct)  Date:  12/07/2014  Time:  2:02 PM  Type of Therapy:  Psychoeducational Skills  Participation Level:  Did Not Attend  Adela Lank [facility] 12/07/2014, 2:02 PM

## 2014-12-07 NOTE — BHH Suicide Risk Assessment (Signed)
[facility] Discharge Suicide Risk Assessment   Demographic Factors:  Divorced or widowed, Caucasian and Living alone  Total Time spent with patient: 30 minutes  Musculoskeletal: Strength & Muscle Tone: within normal limits Gait & Station: normal Patient leans: N/A  Psychiatric Specialty Exam: Physical Exam  Nursing note and vitals reviewed.   Review of Systems  All other systems reviewed and are negative.   Blood pressure 86/59, pulse 88, temperature 98 F (36.7 C), temperature source Oral, resp. rate 17, height 5\' 4"  (1.626 m), weight 115.214 kg (254 lb), SpO2 94 %.Body mass index is 43.58 kg/(m^2).  General Appearance: Casual  Eye Contact::  Good  Speech:  Clear and [id number]  Volume:  Normal  Mood:  Euthymic  Affect:  Appropriate  Thought Process:  Goal Directed  Orientation:  Full (Time, Place, and Person)  Thought Content:  WDL  Suicidal Thoughts:  No  Homicidal Thoughts:  No  Memory:  Immediate;   Fair Recent;   Fair Remote;   Fair  Judgement:  Fair  Insight:  Fair  Psychomotor Activity:  Normal  Concentration:  Fair  Recall:  [employer] of [place]  Language: Fair  Akathisia:  No  Handed:  Right  AIMS (if indicated):     Assets:  Communication Skills Desire for Improvement Financial Resources/Insurance Housing Resilience Social Support  Sleep:  Number of Hours: 7.15  Cognition: WNL  ADL's:  Intact   Have you used any form of tobacco in the last 30 days? (Cigarettes, Smokeless Tobacco, Cigars, and/or Pipes): No  Has this patient used any form of tobacco in the last 30 days? (Cigarettes, Smokeless Tobacco, Cigars, and/or Pipes) No  Mental Status Per Nursing Assessment::   On Admission:  Suicidal ideation indicated by patient, Self-harm thoughts  Current Mental Status by Physician: NA  Loss Factors: Decline in physical health  Historical Factors: Prior suicide attempts and Impulsivity  Risk Reduction Factors:   Sense of responsibility to  family, Positive social support and Positive therapeutic relationship  Continued Clinical Symptoms:  Bipolar Disorder:   Depressive phase Chronic Pain  Cognitive Features That Contribute To Risk:  None    Suicide Risk:  Minimal: No identifiable suicidal ideation.  Patients presenting with no risk factors but with morbid ruminations; may be classified as minimal risk based on the severity of the depressive symptoms  Principal Problem: Schizoaffective disorder, bipolar type [facility]) Discharge Diagnoses:  Patient Active Problem List   Diagnosis Date Noted  . Schizoaffective disorder, bipolar type ([place]) [F25.0] 12/05/2014  . COPD bronchitis [J44.9] 12/05/2014  . HTN (hypertension) [I10] 12/05/2014  . Sleep apnea [G47.30] 12/05/2014  . Diabetes ([place]) [E11.9] 12/05/2014  . Incomplete bladder emptying [R33.9] 12/04/2014  . Recurrent UTI [N39.0] 12/04/2014  . Chest pain [R07.9] 09/23/2014      Plan Of Care/Follow-up recommendations:  Activity:  As tolerated. Diet:  Low sodium heart healthy ADA diet. Other:  P follow-up appointments.  Is patient on multiple antipsychotic therapies at discharge:  No   Has Patient had three or more failed trials of antipsychotic monotherapy by history:  No  Recommended Plan for Multiple Antipsychotic Therapies: NA    Cristina [name] 12/07/2014, 10:33 AM

## 2014-12-07 NOTE — Progress Notes (Signed)
Recreation Therapy Notes  Date: 10.21.16 Time: 3:00 pm Location: Craft Room  Group Topic: Coping Skills  Goal Area(s) Addresses:  Patient will participate in coloring activity. Patient will verbalize benefit of art as a coping skills  Behavioral Response: Did not attend  Intervention: Coloring  Activity: Patients were given different coloring sheets and instructed to think about what emotions they were feeling and what they were focused on while they were coloring.  Education: LRT educated patients on healthy coping skills and why they are important.  Education Outcome: Patient did not attend group.  Clinical Observations/Feedback: Patient did not attend group.  Leonette Monarch, LRT/CTRS 12/07/2014 4:12 PM

## 2014-12-07 NOTE — Plan of Care (Signed)
Problem: Ineffective individual coping Goal: STG: Patient will remain free from self harm Outcome: Progressing Medications administered as ordered by the physician, medications Therapeutic Effects, SEs and Adverse effects discussed, questions encouraged; no PRN given, 15 minute checks maintained for safety, clinical and moral support provided, patient encouraged to continue to express feelings and demonstrate safe care. Patient remain free from harm, will continue to monitor.         

## 2014-12-07 NOTE — Tx Team (Signed)
Interdisciplinary Treatment Plan Update (Adult)  Date:  12/07/2014 Time Reviewed:  4:23 PM  Progress in Treatment: Attending groups: Yes. Participating in groups:  Yes. Taking medication as prescribed:  Yes. Tolerating medication:  Yes. Family/Significant othe contact made:  Yes, individual(s) contacted:  Daughter  Patient understands diagnosis:  Yes. Discussing patient identified problems/goals with staff:  Yes. Medical problems stabilized or resolved:  Yes. Denies suicidal/homicidal ideation: Yes. Issues/concerns per patient self-inventory:  Yes. Other:  New problem(s) identified: No, Describe:  NA  Discharge Plan or Barriers: Pt plans to return home and follow up with outpatient.    Reason for Continuation of Hospitalization: Depression Suicidal ideation  Comments:Ms. Carrigg feels much better mentally today. She is no longer depressed or anxious. She is not suicidal and is glad to be alive. She is asking for discharge. I spoke extensively with Dr. Channing Mutters her primary psychiatrist. We both believe that the patient is not able to handle twice daily catheterization necessary to avoid repeated urinary tract infections. She lives by herself. Her home health nurse is not trained to assist with catheterization. We will ask nephrology consult to see if there is an alternative solution. The patient is only 54 years old and she is unable to accept the fact that she will have to be incontinent or continue catheterization. This is a patient with the history of schizoaffective disorder that easily crumbles under pressure. Her suicide attempt just 3 hours following a visit to her nephrologist is the best example. The patient has difficulties ambulation and uses walker at home. He she has which are available and has Actuary. We asked for PT consult to assess her needs. Last night she had diarrhea which she believes is a part of her IBS.  Estimated length of stay: Pt will d/c today   New goal(s):  NA   Review of initial/current patient goals per problem list:   1.  Goal(s): Patient will participate in aftercare plan * Met:  * Target date: at discharge * As evidenced by: Patient will participate within aftercare plan AEB aftercare provider and housing plan at discharge being identified.   2.  Goal (s): Patient will exhibit decreased depressive symptoms and suicidal ideations. * Met:  *  Target date: at discharge * As evidenced by: Patient will utilize self rating of depression at 3 or below and demonstrate decreased signs of depression or be deemed stable for discharge by MD.   3.  Goal(s): Patient will demonstrate decreased signs and symptoms of anxiety. * Met:  * Target date: at discharge * As evidenced by: Patient will utilize self rating of anxiety at 3 or below and demonstrated decreased signs of anxiety, or be deemed stable for discharge by MD Attendees: Patient:  Cristina [name] 10/21/20164:23 PM  Family:   10/21/20164:23 PM  Physician:  Dr. Bary Leriche  10/21/20164:23 PM  Nursing:   Meredith Mody, RN  10/21/20164:23 PM  Case Manager:   10/21/20164:23 PM  Counselor:   10/21/20164:23 PM  Other:  Wray Kearns, LCSWA 10/21/20164:23 PM  Other:  Everitt Amber, [place]  10/21/20164:23 PM  Other:   10/21/20164:23 PM  Other:  10/21/20164:23 PM  Other:  10/21/20164:23 PM  Other:  10/21/20164:23 PM  Other:  10/21/20164:23 PM  Other:  10/21/20164:23 PM  Other:  10/21/20164:23 PM  Other:   10/21/20164:23 PM   Scribe for Treatment Team:   Lourena Simmonds, [place]  12/07/2014, 4:23 PM

## 2014-12-07 NOTE — Progress Notes (Deleted)
Patient ID: Cristina [name], female   DOB: [birth year], 54 y.o.   MRN: [id number]  CSW attempted to make a referral to [facility] for a care coordinator. However, they would not accept the referral because the patient only has Medicare and not Medicaid.   Coco MSW, [place]  12/07/2014 12:44 PM

## 2014-12-07 NOTE — Discharge Summary (Signed)
Physician Discharge Summary Note  Patient:  Cristina [name] is an 54 y.o., female MRN:  [id number] DOB:  January 29, 1961 Patient phone:  [phone] (home)  Patient address:   [address] [place] [address],  Total Time spent with patient: 30 minutes  Date of Admission:  12/05/2014 Date of Discharge: 12/07/2014  Reason for Admission:  Suicide attempt.  Identifying data. Cristina [name] is a 55 year old female with a history of schizoaffective disorder.  Chief complaint. "I do not know."  History of present illness. Cristina [name] has a long history of mental illness with depression and mood instability. She has been maintained successfully on a combination of Haldol Decanoate and for many years avoiding hospitalization. She is in the care of Dr. Channing Mutters from [facility]. The patient reports good treatment compliance and no symptoms of depression anxiety or psychosis until the day of admission. On the day of admission she had an appointment with her urologist for incontinence and was told that she has to catheter herself twice a day for bladder urine retention resulting in incontinence. The patient returned home and overdosed on over-the-counter medicines. She is not glad to be alive. She cannot bear to follow the instruction. She finds it painful when found by the nurse. She is unable to do with herself due to her obesity. At present she denies symptoms of depression anxiety or psychosis. She does feel down and not knowing what's going to happen. She does not want to wear diapers. There was suggestion from her daughter that the patient may need to be placed in assisted living facility. The patient adamantly refuses. She denies symptoms suggestive of bipolar mania. She denies alcohol or illicit substance use.   Past psychiatric history. There is a long history of mental illness. She has diagnosis of bipolar, schizophrenia, manic depressive. She's been tried on numerous medications but finds Haldol  most useful. It does not worsen her diabetes. She worries that injection does not last for weeks as expected. She had multiple psychiatric hospitalizations in the past but not in recent years. She had 1 suicide attempt by overdose long time ago.  Family psychiatric history. Nonreported.  Social history. She lives by herself and receives home health from home health. She believes that she can still manage. She uses a walker at home and is rather unsteady on her feet. She has a daughter who is been involved and helpful.  Principal Problem: Schizoaffective disorder, bipolar type [name]) Discharge Diagnoses: Patient Active Problem List   Diagnosis Date Noted  . Schizoaffective disorder, bipolar type ([place]) [F25.0] 12/05/2014  . COPD bronchitis [J44.9] 12/05/2014  . HTN (hypertension) [I10] 12/05/2014  . Sleep apnea [G47.30] 12/05/2014  . Diabetes ([place]) [E11.9] 12/05/2014  . Incomplete bladder emptying [R33.9] 12/04/2014  . Recurrent UTI [N39.0] 12/04/2014  . Chest pain [R07.9] 09/23/2014    Musculoskeletal: Strength & Muscle Tone: within normal limits Gait & Station: unsteady Patient leans: N/A  Psychiatric Specialty Exam: Physical Exam  Nursing note and vitals reviewed.   Review of Systems  Musculoskeletal: Positive for joint pain.  All other systems reviewed and are negative.   Blood pressure 86/59, pulse 88, temperature 98 F (36.7 C), temperature source Oral, resp. rate 17, height 5\' 4"  (1.626 m), weight 115.214 kg (254 lb), SpO2 94 %.Body mass index is 43.58 kg/(m^2).  See SRA.  Sleep:  Number of Hours: 7.15   Have you used any form of tobacco in the last 30 days? (Cigarettes, Smokeless Tobacco, Cigars, and/or Pipes): No  Has this patient used any form of tobacco in the last 30 days? (Cigarettes, Smokeless Tobacco, Cigars, and/or Pipes) No  Past Medical History:  Past Medical History  Diagnosis Date  .  Asthma   . CHF (congestive heart failure) ([place])   . Diabetes mellitus without complication ([place])   . History of cholecystectomy     hx of cholecystectomy  . H/O tubal ligation     hx btl  . Hypertension   . Stroke ([place])   . Abdominal hernia   . Fatty tumor   . Sleep apnea   . COPD (chronic obstructive pulmonary disease) ([place])   . H/O colonoscopy   . Bladder dysfunction   . Muscle cramps   . Vaginitis   . Recurrent boils   . Dyspnea   . Edema   . DDD (degenerative disc disease), lumbar   . UTI (lower urinary tract infection)   . Carpal tunnel syndrome   . Neuropathy ([place])   . Obesity   . Arthritis   . Schizoaffective disorder ([place])   . GERD (gastroesophageal reflux disease)   . HLD (hyperlipidemia)     Past Surgical History  Procedure Laterality Date  . Cholecystectomy    . Tubal ligation    . Cardiac catheterization Left 09/24/2014    Procedure: Left Heart Cath and Coronary Angiography;  Surgeon: Dionisio David, MD;  Location: [facility];  Service: Cardiovascular;  Laterality: Left;   Family History:  Family History  Problem Relation Age of Onset  . CAD Mother    Social History:  History  Alcohol Use No     History  Drug Use No    Social History   Social History  . Marital Status: Single    Spouse Name: N/A  . Number of Children: N/A  . Years of Education: N/A   Social History Main Topics  . Smoking status: Former Research scientist (life sciences)  . Smokeless tobacco: None  . Alcohol Use: No  . Drug Use: No  . Sexual Activity: Not Asked   Other Topics Concern  . None   Social History Narrative    Past Psychiatric History: Hospitalizations:  Outpatient Care:  Substance Abuse Care:  Self-Mutilation:  Suicidal Attempts:  Violent Behaviors:   Risk to Self: Is patient at risk for suicide?: Yes Risk to Others:   Prior Inpatient Therapy:   Prior Outpatient Therapy:    Level of Care:  OP  Hospital Course:    Cristina [name] is a 54 year old female with history  of schizoaffective admitted after a suicide attempt by overdose in the context of new medical problems.  1. Suicidal ideation. This has resolved. The patient is able to contract for safety.  2. Mood. She has been maintained on a combination of Remeron and Haldol decanoate injections. 50 mg of Haldol Decanoate was given on 12/06/2014..   3. COPD. We continued inhalers.  4. Diabetes. We continued insulin, metformin, tradjenta, Lipitor, ADA diet and blood glucose monitoring.  5. Sleep apnea. She uses CPAP machine.  6. Hypertension. We continued lisinopril, metoprolol, furosemide, and ASA.  7. Arthritis. We continued Voltaren and Lidoderm patches, Flexeril, Mobic and Lyrica.  8. IBD. Loperamid was available as needed.  9. Urinary retention. The patient has been advised twice daily catheterization. She retains as much as 350 cc of urine. Urology  consult was requested as the patient does not believe she can manage.    10. Deconditioning. She was evaluated by PT. She uses walker at home.  11. Disposition. She will likely return home and follow up with her ACT team. They do not recommend placement. We will arrange for home health.    Consults:  urology  Significant Diagnostic Studies:  None  Discharge Vitals:   Blood pressure 86/59, pulse 88, temperature 98 F (36.7 C), temperature source Oral, resp. rate 17, height 5\' 4"  (1.626 m), weight 115.214 kg (254 lb), SpO2 94 %. Body mass index is 43.58 kg/(m^2). Lab Results:   Results for orders placed or performed during the hospital encounter of 12/05/14 (from the past 72 hour(s))  Glucose, capillary     Status: Abnormal   Collection Time: 12/05/14  2:50 AM  Result Value Ref Range   Glucose-Capillary 189 (H) 65 - 99 mg/dL   Comment 1 Notify RN   Glucose, capillary     Status: Abnormal   Collection Time: 12/05/14  6:23 AM  Result Value Ref Range   Glucose-Capillary 148 (H) 65 - 99 mg/dL  Glucose, capillary     Status: Abnormal    Collection Time: 12/05/14 11:49 AM  Result Value Ref Range   Glucose-Capillary 220 (H) 65 - 99 mg/dL   Comment 1 Notify RN   Glucose, capillary     Status: Abnormal   Collection Time: 12/05/14  5:11 PM  Result Value Ref Range   Glucose-Capillary 135 (H) 65 - 99 mg/dL   Comment 1 Notify RN   Glucose, capillary     Status: Abnormal   Collection Time: 12/05/14  8:32 PM  Result Value Ref Range   Glucose-Capillary 181 (H) 65 - 99 mg/dL  Glucose, capillary     Status: Abnormal   Collection Time: 12/06/14  6:50 AM  Result Value Ref Range   Glucose-Capillary 237 (H) 65 - 99 mg/dL   Comment 1 Notify RN   Glucose, capillary     Status: Abnormal   Collection Time: 12/06/14 11:42 AM  Result Value Ref Range   Glucose-Capillary 170 (H) 65 - 99 mg/dL  Glucose, capillary     Status: Abnormal   Collection Time: 12/06/14  4:33 PM  Result Value Ref Range   Glucose-Capillary 173 (H) 65 - 99 mg/dL   Comment 1 Notify RN   Glucose, capillary     Status: Abnormal   Collection Time: 12/06/14  8:45 PM  Result Value Ref Range   Glucose-Capillary 154 (H) 65 - 99 mg/dL  Glucose, capillary     Status: Abnormal   Collection Time: 12/07/14  6:57 AM  Result Value Ref Range   Glucose-Capillary 142 (H) 65 - 99 mg/dL    Physical Findings: AIMS:  , ,  ,  ,    CIWA:    COWS:      See Psychiatric Specialty Exam and Suicide Risk Assessment completed by Attending Physician prior to discharge.  Discharge destination:  Home  Is patient on multiple antipsychotic therapies at discharge:  No   Has Patient had three or more failed trials of antipsychotic monotherapy by history:  No    Recommended Plan for Multiple Antipsychotic Therapies: NA  Discharge Instructions    Diet - low sodium heart healthy    Complete by:  As directed      Increase activity slowly    Complete by:  As directed  Medication List    TAKE these medications      Indication   albuterol 108 (90 BASE) MCG/ACT inhaler   Commonly known as:  PROVENTIL HFA;VENTOLIN HFA  Inhale 2 puffs into the lungs every 6 (six) hours as needed for wheezing or shortness of breath.      aspirin EC 81 MG tablet  Take 81 mg by mouth daily.      atorvastatin 20 MG tablet  Commonly known as:  LIPITOR  Take 20 mg by mouth at bedtime.      cetirizine 10 MG tablet  Commonly known as:  ZYRTEC  Take 10 mg by mouth daily as needed for allergies.      clonazePAM 0.5 MG tablet  Commonly known as:  KLONOPIN  Take 0.5 mg by mouth 2 (two) times daily.      cyclobenzaprine 10 MG tablet  Commonly known as:  FLEXERIL  Take 10 mg by mouth 2 (two) times daily.      diclofenac sodium 1 % Gel  Commonly known as:  VOLTAREN  Apply 2 g topically 4 (four) times daily as needed (for pain).      esomeprazole 40 MG capsule  Commonly known as:  NEXIUM  Take 40 mg by mouth daily.      Fluticasone-Salmeterol 250-50 MCG/DOSE Aepb  Commonly known as:  ADVAIR  Inhale 1 puff into the lungs 2 (two) times daily.      furosemide 40 MG tablet  Commonly known as:  LASIX  Take 80 mg by mouth daily.      haloperidol decanoate 100 MG/ML injection  Commonly known as:  HALDOL DECANOATE  Inject 0.5 mLs (50 mg total) into the muscle every 30 (thirty) days.   Indication:  Psychosis     insulin detemir 100 UNIT/ML injection  Commonly known as:  LEVEMIR  Inject 50-52 Units into the skin 2 (two) times daily. Pt uses 50 units in the morning and 52 units at bedtime.      ketoconazole 2 % shampoo  Commonly known as:  NIZORAL  Apply 1 application topically every other day.      lidocaine 5 %  Commonly known as:  LIDODERM  Place 1 patch onto the skin every 12 (twelve) hours. Remove & Discard patch within 12 hours or as directed by MD      linagliptin 5 MG Tabs tablet  Commonly known as:  TRADJENTA  Take 5 mg by mouth daily.      meloxicam 7.5 MG tablet  Commonly known as:  MOBIC  Take 7.5 mg by mouth 2 (two) times daily.      metFORMIN 500 MG  24 hr tablet  Commonly known as:  GLUCOPHAGE-XR  Take 1,000 mg by mouth 2 (two) times daily.      metoprolol 50 MG tablet  Commonly known as:  LOPRESSOR  Take 50 mg by mouth 2 (two) times daily.      mirtazapine 45 MG tablet  Commonly known as:  REMERON  Take 45 mg by mouth at bedtime.      pregabalin 100 MG capsule  Commonly known as:  LYRICA  Take 100 mg by mouth 3 (three) times daily as needed (for nerve pain).      quinapril 20 MG tablet  Commonly known as:  ACCUPRIL  Take 20 mg by mouth daily.      tiotropium 18 MCG inhalation capsule  Commonly known as:  SPIRIVA  Place 18 mcg into inhaler and inhale daily.  traZODone 100 MG tablet  Commonly known as:  DESYREL  Take 1 tablet (100 mg total) by mouth at bedtime as needed for sleep.   Indication:  Trouble Sleeping         Follow-up recommendations:  Activity:  As tolerated. Diet:  Low sodium heart healthy ADA diet Other:  Keep follow-up appointment.  Comments:    Total Discharge Time: 35 min.  Signed: Orson Slick 12/07/2014, 10:36 AM

## 2014-12-07 NOTE — Progress Notes (Signed)
D: pt aware of discharge this shift, pt denies suicidal ideation or homicidal ideation, pt calm and cooperative, no distress noted  A: all personal items in locker returned to patient, instructions given on discharge information, received prescriptions and follow up appointment  R: patient states she will comply with outpatient services and medications as prescribed.  Patient left with mother.

## 2014-12-07 NOTE — BHH Suicide Risk Assessment (Signed)
[facility] INPATIENT:  Family/Significant Other Suicide Prevention Education  Suicide Prevention Education:  Education Completed; Sharen Hones [phone] has been identified by the patient as the family member/significant other with whom the patient will be residing, and identified as the person(s) who will aid the patient in the event of a mental health crisis (suicidal ideations/suicide attempt).  With written consent from the patient, the family member/significant other has been provided the following suicide prevention education, prior to the and/or following the discharge of the patient.  The suicide prevention education provided includes the following:  Suicide risk factors  Suicide prevention and interventions  National Suicide Hotline telephone number  [facility] assessment telephone number  [facility] Emergency Assistance [place] and/or Residential Mobile Crisis Unit telephone number  Request made of family/significant other to:  Remove weapons (e.g., guns, rifles, knives), all items previously/currently identified as safety concern.    Remove drugs/medications (over-the-counter, prescriptions, illicit drugs), all items previously/currently identified as a safety concern.  The family member/significant other verbalizes understanding of the suicide prevention education information provided.  The family member/significant other agrees to remove the items of safety concern listed above.  [place] MSW, [place]  12/07/2014, 12:41 PM
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
## 2014-12-07 NOTE — Progress Notes (Signed)
  [facility] Adult Case Management Discharge Plan :  Will you be returning to the same living situation after discharge:  Yes,  home  At discharge, do you have transportation home?: Yes,  Daughter Do you have the ability to pay for your medications: Yes,  Insurance   Release of information consent forms completed and in the chart;  Patient's signature needed at discharge.  Patient to Follow up at: Follow-up Information    Follow up with [facility]  On 12/10/2014.   Why:  Your ACT team will visit you on Monday October 24th.    Contact information:   [address], [place], [place] [address] Phone: [phone]      Patient denies SI/HI: Yes,  Yes    Safety Planning and Suicide Prevention discussed: Yes,  with patient and daughter   Have you used any form of tobacco in the last 30 days? (Cigarettes, Smokeless Tobacco, Cigars, and/or Pipes): No  Has patient been referred to the Quitline?: N/A patient is not a smoker  [place] MSW, [place]  12/07/2014, 3:21 PM

## 2014-12-07 NOTE — Progress Notes (Signed)
Observed ambulating with a rolling walker in the milieu, remains on 1:1 Arm's Length for safety, voiding, denied pain or pressure from hypogastric area indicating urine retention. Flat affect, yearns for home and wants to be discharged. Visitation by daughter went well according to patient; she would prefer ALF instead of her department on discharge. Denied SI/HI, denied AV/H.

## 2014-12-07 NOTE — BHH Group Notes (Signed)
[place] LCSW Group Therapy  12/07/2014 2:34 PM  Type of Therapy:  Group Therapy  Participation Level:  Active  Participation Quality:  Appropriate, Sharing and Supportive  Affect:  Appropriate  Cognitive:  Alert  Insight:  Engaged  Engagement in Therapy:  Engaged and Supportive  Modes of Intervention:  Discussion, Education, Socialization and Support  Summary of Progress/Problems: Feelings around Relapse. Group members discussed the meaning of relapse and shared personal stories of relapse, how it affected them and others, and how they perceived themselves during this time. Group members were encouraged to identify triggers, warning signs and coping skills used when facing the possibility of relapse. Social supports were discussed and explored in detail. Patient attended group and participated appropriately. She shared feelings of happiness about being discharged today and being referred for home health assistance. Patient remained engaged, identified that change in health is a trigger for relapse, and was supportive to members of the group.  Christa See, Clinical Social Work Intern 12/07/2014, 2:34 PM

## 2014-12-08 LAB — GLUCOSE, CAPILLARY: Glucose-Capillary: 189 mg/dL — ABNORMAL HIGH (ref 65–99)

## 2015-06-20 ENCOUNTER — Other Ambulatory Visit
Admission: RE | Admit: 2015-06-20 | Discharge: 2015-06-20 | Disposition: A | Discharge status: Home / Self Care | Payer: Medicare Other | Source: Ambulatory Visit | Attending: Gastroenterology | Admitting: Gastroenterology

## 2015-06-20 DIAGNOSIS — R197 Diarrhea, unspecified: Secondary | ICD-10-CM | POA: Diagnosis present

## 2015-06-20 LAB — GASTROINTESTINAL PANEL BY PCR, STOOL (REPLACES STOOL CULTURE)

## 2015-06-20 LAB — CLOSTRIDIUM DIFFICILE BY PCR: Toxigenic C. Difficile by PCR: POSITIVE — AB

## 2015-06-20 LAB — C DIFFICILE QUICK SCREEN W PCR REFLEX
C Diff antigen: POSITIVE — AB
C Diff toxin: NEGATIVE

## 2015-06-26 ENCOUNTER — Other Ambulatory Visit: Payer: Self-pay | Admitting: Gastroenterology

## 2015-06-26 DIAGNOSIS — R1310 Dysphagia, unspecified: Secondary | ICD-10-CM

## 2015-07-16 ENCOUNTER — Ambulatory Visit
Admission: RE | Admit: 2015-07-16 | Discharge: 2015-07-16 | Disposition: A | Discharge status: Home / Self Care | Payer: Medicare Other | Source: Ambulatory Visit | Attending: Gastroenterology | Admitting: Gastroenterology

## 2015-07-16 DIAGNOSIS — I69391 Dysphagia following cerebral infarction: Secondary | ICD-10-CM | POA: Diagnosis not present

## 2015-07-16 DIAGNOSIS — R1310 Dysphagia, unspecified: Secondary | ICD-10-CM

## 2015-07-16 NOTE — Therapy (Signed)
[place], [place], [address] Phone: [phone]   Fax:     Modified Barium Swallow  Patient Details  Name: Cristina [name] MRN: [id number] Date of Birth: 01/13/1961 No Data Recorded  Encounter Date: 07/16/2015   Subjective: Patient behavior: (alertness, ability to follow instructions, etc.): Chief complaint: Dysphagia   Objective:  Radiological Procedure: A videoflouroscopic evaluation of oral-preparatory, reflex initiation, and pharyngeal phases of the swallow was performed; as well as a screening of the upper esophageal phase.  I. POSTURE: upright II. VIEW: lateral III. COMPENSATORY STRATEGIES:  NONE IV. BOLUSES ADMINISTERED:  Thin Liquid: 4 trials  Nectar-thick Liquid: 1 trial  Honey-thick Liquid: NT  Puree: 3 trials  Mechanical Soft: 1 trial(softened sec. to edentulous status today - did not bring dentures) V. RESULTS OF EVALUATION: A. ORAL PREPARATORY PHASE: (The lips, tongue, and velum are observed for strength and coordination)       **Overall Severity Rating: NONE  B. SWALLOW INITIATION/REFLEX: (The reflex is normal if "triggered" by the time the bolus reached the base of the tongue)  **Overall Severity Rating: NONE  C. PHARYNGEAL PHASE: (Pharyngeal function is normal if the bolus shows rapid, smooth, and continuous transit through the pharynx and there is no pharyngeal residue after the swallow)  **Overall Severity Rating: NONE  D. LARYNGEAL PENETRATION: (Material entering into the laryngeal inlet/vestibule but not aspirated): NONE occurred   E. ASPIRATION: NONE occurred  F. ESOPHAGEAL PHASE: (Screening of the upper esophagus): None observed during brief screening of the cervical-mid Esophagus  ASSESSMENT: Pt exhibited an adequate oropharyngeal phase swallow function w/ no apparent oral or pharyngeal phase deficits during bolus management and swallowing. No aspiration or laryngeal  penetration occurred w/ any bolus consistency during this exam.   PLAN/RECOMMENDATIONS:  A. Diet: mech soft/regular, thin liquids (moistened, broken down foods for easier mastication/gumming when not using dentures, and for easier Esophageal motility)  B. Swallowing Precautions: general aspiration precautions; Reflux precautions  C. Recommended consultation to: continue f/u w/ GI for ongoing management of GERD; education on GERD for pt/caregivers  D. Therapy recommendations: none at this time  E. Results and recommendations were discussed w/ pt(no family/caregiver present); video viewed w/ pt      End of Session - 07/16/15 1559    Visit Number 1   Number of Visits 1   Date for SLP Re-Evaluation 07/16/15   SLP Start Time 1255   SLP Stop Time  1355   SLP Time Calculation (min) 60 min   Activity Tolerance Patient tolerated treatment well      Past Medical History  Diagnosis Date  . Asthma   . CHF (congestive heart failure) ([facility])   . Diabetes mellitus without complication ([facility])   . History of cholecystectomy     hx of cholecystectomy  . H/O tubal ligation     hx btl  . Hypertension   . Stroke ([facility])   . Abdominal hernia   . Fatty tumor   . Sleep apnea   . COPD (chronic obstructive pulmonary disease) ([facility])   . H/O colonoscopy   . Bladder dysfunction   . Muscle cramps   . Vaginitis   . Recurrent boils   . Dyspnea   . Edema   . DDD (degenerative disc disease), lumbar   . UTI (lower urinary tract infection)   . Carpal tunnel syndrome   . Neuropathy ([place])   . Obesity   . Arthritis   . Schizoaffective disorder ([place])   .  GERD (gastroesophageal reflux disease)   . HLD (hyperlipidemia)     Past Surgical History  Procedure Laterality Date  . Cholecystectomy    . Tubal ligation    . Cardiac catheterization Left 09/24/2014    Procedure: Left Heart Cath and Coronary Angiography;  Surgeon: Dionisio David, MD;  Location: [facility];  Service: Cardiovascular;   Laterality: Left;    There were no vitals filed for this visit.              Patient will benefit from skilled therapeutic intervention in order to improve the following deficits and impairments:   Dysphagia - Plan: DG OP Swallowing Func-Medicare/Speech Path, DG OP Swallowing Func-Medicare/Speech Path      G-Codes - 07-17-2015 1559    Functional Limitations Swallowing   Swallow Current Status [id number] 0 percent impaired, limited or restricted   Swallow Goal Status [id number] 0 percent impaired, limited or restricted   Swallow Discharge Status [id number] 0 percent impaired, limited or restricted          Problem List Patient Active Problem List   Diagnosis Date Noted  . Schizoaffective disorder, bipolar type ([facility]) 12/05/2014  . COPD bronchitis 12/05/2014  . HTN (hypertension) 12/05/2014  . Sleep apnea 12/05/2014  . Diabetes ([place]) 12/05/2014  . Incomplete bladder emptying 12/04/2014  . Recurrent UTI 12/04/2014  . Chest pain 09/23/2014      Orinda Kenner, [place], CCC-SLP  Trana Ressler Jul 17, 2015, 4:00 PM  [facility] [place], [place], [address] Phone: [phone]   Fax:     Name: Cristina [name] MRN: [id number] Date of Birth: 10/30/1960

## 2015-07-30 ENCOUNTER — Emergency Department
Admission: EM | Admit: 2015-07-30 | Discharge: 2015-07-30 | Disposition: A | Discharge status: Other Acute Inpatient Hospital | Payer: Medicare Other | Attending: Emergency Medicine | Admitting: Emergency Medicine

## 2015-07-30 ENCOUNTER — Inpatient Hospital Stay
Admit: 2015-07-30 | Discharge: 2015-08-03 | DRG: 885 | Disposition: A | Discharge status: Home / Self Care | Payer: Medicare Other | Source: Intra-hospital | Attending: Psychiatry | Admitting: Psychiatry

## 2015-07-30 DIAGNOSIS — T39092A Poisoning by salicylates, intentional self-harm, initial encounter: Secondary | ICD-10-CM | POA: Diagnosis present

## 2015-07-30 DIAGNOSIS — E669 Obesity, unspecified: Secondary | ICD-10-CM | POA: Diagnosis present

## 2015-07-30 DIAGNOSIS — Z7951 Long term (current) use of inhaled steroids: Secondary | ICD-10-CM | POA: Insufficient documentation

## 2015-07-30 DIAGNOSIS — K219 Gastro-esophageal reflux disease without esophagitis: Secondary | ICD-10-CM | POA: Diagnosis present

## 2015-07-30 DIAGNOSIS — Z8249 Family history of ischemic heart disease and other diseases of the circulatory system: Secondary | ICD-10-CM | POA: Diagnosis not present

## 2015-07-30 DIAGNOSIS — E119 Type 2 diabetes mellitus without complications: Secondary | ICD-10-CM | POA: Diagnosis not present

## 2015-07-30 DIAGNOSIS — F250 Schizoaffective disorder, bipolar type: Principal | ICD-10-CM | POA: Diagnosis present

## 2015-07-30 DIAGNOSIS — J45909 Unspecified asthma, uncomplicated: Secondary | ICD-10-CM | POA: Insufficient documentation

## 2015-07-30 DIAGNOSIS — Z87891 Personal history of nicotine dependence: Secondary | ICD-10-CM | POA: Insufficient documentation

## 2015-07-30 DIAGNOSIS — Z9851 Tubal ligation status: Secondary | ICD-10-CM

## 2015-07-30 DIAGNOSIS — T39011A Poisoning by aspirin, accidental (unintentional), initial encounter: Secondary | ICD-10-CM

## 2015-07-30 DIAGNOSIS — E785 Hyperlipidemia, unspecified: Secondary | ICD-10-CM | POA: Insufficient documentation

## 2015-07-30 DIAGNOSIS — R45851 Suicidal ideations: Secondary | ICD-10-CM | POA: Diagnosis present

## 2015-07-30 DIAGNOSIS — Z87440 Personal history of urinary (tract) infections: Secondary | ICD-10-CM

## 2015-07-30 DIAGNOSIS — J449 Chronic obstructive pulmonary disease, unspecified: Secondary | ICD-10-CM | POA: Diagnosis present

## 2015-07-30 DIAGNOSIS — R339 Retention of urine, unspecified: Secondary | ICD-10-CM | POA: Diagnosis present

## 2015-07-30 DIAGNOSIS — E1140 Type 2 diabetes mellitus with diabetic neuropathy, unspecified: Secondary | ICD-10-CM | POA: Diagnosis present

## 2015-07-30 DIAGNOSIS — I110 Hypertensive heart disease with heart failure: Secondary | ICD-10-CM | POA: Insufficient documentation

## 2015-07-30 DIAGNOSIS — Z79899 Other long term (current) drug therapy: Secondary | ICD-10-CM | POA: Insufficient documentation

## 2015-07-30 DIAGNOSIS — Z8673 Personal history of transient ischemic attack (TIA), and cerebral infarction without residual deficits: Secondary | ICD-10-CM | POA: Diagnosis not present

## 2015-07-30 DIAGNOSIS — I509 Heart failure, unspecified: Secondary | ICD-10-CM | POA: Insufficient documentation

## 2015-07-30 DIAGNOSIS — Z9049 Acquired absence of other specified parts of digestive tract: Secondary | ICD-10-CM

## 2015-07-30 DIAGNOSIS — G4700 Insomnia, unspecified: Secondary | ICD-10-CM | POA: Diagnosis present

## 2015-07-30 DIAGNOSIS — Z794 Long term (current) use of insulin: Secondary | ICD-10-CM | POA: Insufficient documentation

## 2015-07-30 DIAGNOSIS — F319 Bipolar disorder, unspecified: Secondary | ICD-10-CM | POA: Insufficient documentation

## 2015-07-30 DIAGNOSIS — M5136 Other intervertebral disc degeneration, lumbar region: Secondary | ICD-10-CM | POA: Diagnosis not present

## 2015-07-30 DIAGNOSIS — G8929 Other chronic pain: Secondary | ICD-10-CM | POA: Diagnosis present

## 2015-07-30 DIAGNOSIS — Z7982 Long term (current) use of aspirin: Secondary | ICD-10-CM

## 2015-07-30 DIAGNOSIS — M549 Dorsalgia, unspecified: Secondary | ICD-10-CM | POA: Diagnosis present

## 2015-07-30 DIAGNOSIS — F259 Schizoaffective disorder, unspecified: Secondary | ICD-10-CM | POA: Insufficient documentation

## 2015-07-30 DIAGNOSIS — M1990 Unspecified osteoarthritis, unspecified site: Secondary | ICD-10-CM | POA: Diagnosis present

## 2015-07-30 DIAGNOSIS — G4730 Sleep apnea, unspecified: Secondary | ICD-10-CM | POA: Diagnosis present

## 2015-07-30 DIAGNOSIS — T7849XA Other allergy, initial encounter: Secondary | ICD-10-CM | POA: Diagnosis present

## 2015-07-30 DIAGNOSIS — G629 Polyneuropathy, unspecified: Secondary | ICD-10-CM

## 2015-07-30 DIAGNOSIS — I10 Essential (primary) hypertension: Secondary | ICD-10-CM | POA: Diagnosis present

## 2015-07-30 DIAGNOSIS — Z7984 Long term (current) use of oral hypoglycemic drugs: Secondary | ICD-10-CM | POA: Diagnosis not present

## 2015-07-30 LAB — COMPREHENSIVE METABOLIC PANEL
ALT: 22 U/L (ref 14–54)
AST: 16 U/L (ref 15–41)
Albumin: 3.8 g/dL (ref 3.5–5.0)
Alkaline Phosphatase: 118 U/L (ref 38–126)
Anion gap: 11 (ref 5–15)
BUN: 11 mg/dL (ref 6–20)
CO2: 28 mmol/L (ref 22–32)
Calcium: 8.7 mg/dL — ABNORMAL LOW (ref 8.9–10.3)
Chloride: 100 mmol/L — ABNORMAL LOW (ref 101–111)
Creatinine, Ser: 0.82 mg/dL (ref 0.44–1.00)
GFR calc Af Amer: >60 mL/min (ref >60)
GFR calc non Af Amer: >60 mL/min (ref >60)
Glucose, Bld: 108 mg/dL — ABNORMAL HIGH (ref 65–99)
Potassium: 3.7 mmol/L (ref 3.5–5.1)
Sodium: 139 mmol/L (ref 135–145)
Total Bilirubin: <0.1 mg/dL — ABNORMAL LOW (ref 0.3–1.2)
Total Protein: 7.2 g/dL (ref 6.5–8.1)

## 2015-07-30 LAB — BLOOD GAS, VENOUS
Acid-Base Excess: 6.5 mmol/L — ABNORMAL HIGH (ref 0.0–3.0)
Bicarbonate: 33.3 mEq/L — ABNORMAL HIGH (ref 21.0–28.0)
Patient temperature: 37
pCO2, Ven: 59 mmHg (ref 44.0–60.0)
pH, Ven: 7.36 (ref 7.320–7.430)
pO2, Ven: <31 mmHg — ABNORMAL LOW (ref 31.0–45.0)

## 2015-07-30 LAB — URINE DRUG SCREEN, QUALITATIVE (ARMC ONLY)
Amphetamines, Ur Screen: NOT DETECTED
Barbiturates, Ur Screen: NOT DETECTED
Benzodiazepine, Ur Scrn: NOT DETECTED
Cannabinoid 50 Ng, Ur ~~LOC~~: NOT DETECTED
Cocaine Metabolite,Ur ~~LOC~~: NOT DETECTED
MDMA (Ecstasy)Ur Screen: NOT DETECTED
Methadone Scn, Ur: NOT DETECTED
Opiate, Ur Screen: NOT DETECTED
Phencyclidine (PCP) Ur S: NOT DETECTED
Tricyclic, Ur Screen: NOT DETECTED

## 2015-07-30 LAB — URINALYSIS COMPLETE WITH MICROSCOPIC (ARMC ONLY)
Bilirubin Urine: NEGATIVE
Glucose, UA: NEGATIVE mg/dL
Hgb urine dipstick: NEGATIVE
Ketones, ur: NEGATIVE mg/dL
Nitrite: NEGATIVE
Protein, ur: NEGATIVE mg/dL
RBC / HPF: NONE SEEN RBC/hpf (ref 0–5)
Specific Gravity, Urine: 1.002 — ABNORMAL LOW (ref 1.005–1.030)
Squamous Epithelial / LPF: NONE SEEN
pH: 6 (ref 5.0–8.0)

## 2015-07-30 LAB — ETHANOL: Alcohol, Ethyl (B): <5 mg/dL (ref <5)

## 2015-07-30 LAB — ACETAMINOPHEN LEVEL: Acetaminophen (Tylenol), Serum: <10 ug/mL — ABNORMAL LOW (ref 10–30)

## 2015-07-30 LAB — SALICYLATE LEVEL
Salicylate Lvl: 37.2 mg/dL (ref 2.8–30.0)
Salicylate Lvl: 28.8 mg/dL (ref 2.8–30.0)

## 2015-07-30 LAB — GLUCOSE, CAPILLARY: Glucose-Capillary: 147 mg/dL — ABNORMAL HIGH (ref 65–99)

## 2015-07-30 LAB — LIPASE, BLOOD: Lipase: 17 U/L (ref 11–51)

## 2015-07-30 MED ORDER — LINAGLIPTIN 5 MG PO TABS
5.0000 mg | ORAL_TABLET | Freq: Every day | ORAL | Status: DC
  Filled 2015-07-30: qty 1

## 2015-07-30 MED ORDER — TIOTROPIUM BROMIDE MONOHYDRATE 18 MCG IN CAPS
18.0000 ug | ORAL_CAPSULE | Freq: Every day | RESPIRATORY_TRACT | Status: DC
  Filled 2015-07-30: qty 5

## 2015-07-30 MED ORDER — LIDOCAINE 5 % EX PTCH
1.0000 | MEDICATED_PATCH | CUTANEOUS | Status: DC
  Administered 2015-07-30: 1 via TRANSDERMAL
  Filled 2015-07-30: qty 1

## 2015-07-30 MED ORDER — TIOTROPIUM BROMIDE MONOHYDRATE 18 MCG IN CAPS
18.0000 ug | ORAL_CAPSULE | Freq: Every day | RESPIRATORY_TRACT | Status: DC
  Administered 2015-08-02 – 2015-08-03 (×2): 18 ug via RESPIRATORY_TRACT
  Filled 2015-07-30: qty 5

## 2015-07-30 MED ORDER — ALUM & MAG HYDROXIDE-SIMETH 200-200-20 MG/5ML PO SUSP: 30.0000 mL | ORAL | Status: DC | PRN

## 2015-07-30 MED ORDER — METOPROLOL TARTRATE 25 MG PO TABS
50.0000 mg | ORAL_TABLET | Freq: Two times a day (BID) | ORAL | Status: DC
  Administered 2015-07-31: 50 mg via ORAL
  Administered 2015-07-31: 22:00:00 via ORAL
  Administered 2015-08-01 – 2015-08-03 (×5): 50 mg via ORAL
  Filled 2015-07-30 (×7): qty 2

## 2015-07-30 MED ORDER — LORATADINE 10 MG PO TABS
10.0000 mg | ORAL_TABLET | Freq: Every day | ORAL | Status: DC
  Filled 2015-07-30: qty 1

## 2015-07-30 MED ORDER — FUROSEMIDE 20 MG PO TABS
40.0000 mg | ORAL_TABLET | Freq: Every day | ORAL | Status: DC
  Administered 2015-07-31 – 2015-08-03 (×4): 40 mg via ORAL
  Filled 2015-07-30 (×4): qty 2

## 2015-07-30 MED ORDER — MIRTAZAPINE 15 MG PO TABS
45.0000 mg | ORAL_TABLET | Freq: Every day | ORAL | Status: DC
  Administered 2015-07-31 – 2015-08-02 (×3): 45 mg via ORAL
  Filled 2015-07-30 (×3): qty 3

## 2015-07-30 MED ORDER — DICLOFENAC SODIUM 1 % TD GEL: 2.0000 g | Freq: Four times a day (QID) | TRANSDERMAL | Status: DC | PRN

## 2015-07-30 MED ORDER — TRAZODONE HCL 100 MG PO TABS
100.0000 mg | ORAL_TABLET | Freq: Every day | ORAL | Status: DC
  Administered 2015-07-31 – 2015-08-02 (×3): 100 mg via ORAL
  Filled 2015-07-30 (×3): qty 1

## 2015-07-30 MED ORDER — CYCLOBENZAPRINE HCL 10 MG PO TABS
10.0000 mg | ORAL_TABLET | Freq: Two times a day (BID) | ORAL | Status: DC
  Administered 2015-07-31: 10 mg via ORAL
  Filled 2015-07-30: qty 1

## 2015-07-30 MED ORDER — CLONAZEPAM 0.5 MG PO TABS
0.5000 mg | ORAL_TABLET | Freq: Two times a day (BID) | ORAL | Status: DC
  Administered 2015-07-31 – 2015-08-03 (×7): 0.5 mg via ORAL
  Filled 2015-07-30 (×7): qty 1

## 2015-07-30 MED ORDER — ATORVASTATIN CALCIUM 20 MG PO TABS: 20.0000 mg | ORAL_TABLET | Freq: Every day | ORAL | Status: DC

## 2015-07-30 MED ORDER — LISINOPRIL 10 MG PO TABS
20.0000 mg | ORAL_TABLET | Freq: Every day | ORAL | Status: DC
  Filled 2015-07-30: qty 2

## 2015-07-30 MED ORDER — INSULIN DETEMIR 100 UNIT/ML ~~LOC~~ SOLN
52.0000 [IU] | Freq: Every day | SUBCUTANEOUS | Status: DC
  Filled 2015-07-30: qty 0.52

## 2015-07-30 MED ORDER — ASPIRIN EC 81 MG PO TBEC
81.0000 mg | DELAYED_RELEASE_TABLET | Freq: Every day | ORAL | Status: DC
  Filled 2015-07-30: qty 1

## 2015-07-30 MED ORDER — ALBUTEROL SULFATE HFA 108 (90 BASE) MCG/ACT IN AERS
2.0000 | INHALATION_SPRAY | RESPIRATORY_TRACT | Status: DC | PRN
  Filled 2015-07-30: qty 6.7

## 2015-07-30 MED ORDER — PANTOPRAZOLE SODIUM 40 MG PO TBEC
40.0000 mg | DELAYED_RELEASE_TABLET | Freq: Every day | ORAL | Status: DC
  Administered 2015-07-30: 40 mg via ORAL
  Filled 2015-07-30: qty 1

## 2015-07-30 MED ORDER — ALBUTEROL SULFATE HFA 108 (90 BASE) MCG/ACT IN AERS: 2.0000 | INHALATION_SPRAY | RESPIRATORY_TRACT | Status: DC | PRN

## 2015-07-30 MED ORDER — PANTOPRAZOLE SODIUM 40 MG PO TBEC
40.0000 mg | DELAYED_RELEASE_TABLET | Freq: Every day | ORAL | Status: DC
  Administered 2015-07-31 – 2015-08-03 (×4): 40 mg via ORAL
  Filled 2015-07-30 (×4): qty 1

## 2015-07-30 MED ORDER — DICLOFENAC SODIUM 1 % TD GEL
2.0000 g | Freq: Four times a day (QID) | TRANSDERMAL | Status: DC | PRN
  Filled 2015-07-30: qty 100

## 2015-07-30 MED ORDER — ASPIRIN EC 81 MG PO TBEC: 81.0000 mg | DELAYED_RELEASE_TABLET | Freq: Every day | ORAL | Status: DC

## 2015-07-30 MED ORDER — PREGABALIN 50 MG PO CAPS
100.0000 mg | ORAL_CAPSULE | Freq: Three times a day (TID) | ORAL | Status: DC
  Administered 2015-07-31 – 2015-08-03 (×10): 100 mg via ORAL
  Filled 2015-07-30 (×10): qty 2

## 2015-07-30 MED ORDER — INSULIN DETEMIR 100 UNIT/ML ~~LOC~~ SOLN
52.0000 [IU] | Freq: Every day | SUBCUTANEOUS | Status: DC
  Administered 2015-07-31 – 2015-08-02 (×3): 52 [IU] via SUBCUTANEOUS
  Filled 2015-07-30 (×4): qty 0.52

## 2015-07-30 MED ORDER — LIDOCAINE 5 % EX PTCH
1.0000 | MEDICATED_PATCH | CUTANEOUS | Status: DC
  Administered 2015-07-31 – 2015-08-02 (×3): 1 via TRANSDERMAL
  Filled 2015-07-30 (×4): qty 1

## 2015-07-30 MED ORDER — LORATADINE 10 MG PO TABS
10.0000 mg | ORAL_TABLET | Freq: Every day | ORAL | Status: DC
  Administered 2015-07-31 – 2015-08-03 (×4): 10 mg via ORAL
  Filled 2015-07-30 (×4): qty 1

## 2015-07-30 MED ORDER — METOPROLOL TARTRATE 50 MG PO TABS
50.0000 mg | ORAL_TABLET | Freq: Two times a day (BID) | ORAL | Status: DC
  Filled 2015-07-30: qty 1

## 2015-07-30 MED ORDER — LISINOPRIL 20 MG PO TABS
20.0000 mg | ORAL_TABLET | Freq: Every day | ORAL | Status: DC
  Administered 2015-07-31 – 2015-08-03 (×4): 20 mg via ORAL
  Filled 2015-07-30 (×4): qty 1

## 2015-07-30 MED ORDER — FUROSEMIDE 40 MG PO TABS
40.0000 mg | ORAL_TABLET | Freq: Every day | ORAL | Status: DC
  Filled 2015-07-30: qty 1

## 2015-07-30 MED ORDER — METFORMIN HCL ER 500 MG PO TB24: 500.0000 mg | ORAL_TABLET | Freq: Two times a day (BID) | ORAL | Status: DC

## 2015-07-30 MED ORDER — CYCLOBENZAPRINE HCL 10 MG PO TABS: 10.0000 mg | ORAL_TABLET | Freq: Two times a day (BID) | ORAL | Status: DC

## 2015-07-30 MED ORDER — LINAGLIPTIN 5 MG PO TABS
5.0000 mg | ORAL_TABLET | Freq: Every day | ORAL | Status: DC
  Administered 2015-07-31 – 2015-08-03 (×4): 5 mg via ORAL
  Filled 2015-07-30 (×4): qty 1

## 2015-07-30 MED ORDER — TRAZODONE HCL 100 MG PO TABS
100.0000 mg | ORAL_TABLET | Freq: Every day | ORAL | Status: DC
  Administered 2015-07-30: 100 mg via ORAL
  Filled 2015-07-30: qty 1

## 2015-07-30 MED ORDER — SODIUM CHLORIDE 0.9 % IV BOLUS (SEPSIS)
1000.0000 mL | Freq: Once | INTRAVENOUS | Status: AC
  Administered 2015-07-30: 1000 mL via INTRAVENOUS

## 2015-07-30 MED ORDER — MOMETASONE FURO-FORMOTEROL FUM 200-5 MCG/ACT IN AERO
2.0000 | INHALATION_SPRAY | Freq: Two times a day (BID) | RESPIRATORY_TRACT | Status: DC
  Filled 2015-07-30: qty 8.8

## 2015-07-30 MED ORDER — ACETAMINOPHEN 325 MG PO TABS
650.0000 mg | ORAL_TABLET | Freq: Four times a day (QID) | ORAL | Status: DC | PRN
  Administered 2015-08-02: 650 mg via ORAL
  Filled 2015-07-30: qty 2

## 2015-07-30 MED ORDER — MOMETASONE FURO-FORMOTEROL FUM 200-5 MCG/ACT IN AERO
2.0000 | INHALATION_SPRAY | Freq: Two times a day (BID) | RESPIRATORY_TRACT | Status: DC
  Administered 2015-07-31 – 2015-08-03 (×7): 2 via RESPIRATORY_TRACT
  Filled 2015-07-30: qty 8.8

## 2015-07-30 MED ORDER — MELOXICAM 7.5 MG PO TABS
7.5000 mg | ORAL_TABLET | Freq: Every day | ORAL | Status: DC
  Administered 2015-07-31: 7.5 mg via ORAL
  Filled 2015-07-30: qty 1

## 2015-07-30 MED ORDER — INSULIN DETEMIR 100 UNIT/ML ~~LOC~~ SOLN
50.0000 [IU] | SUBCUTANEOUS | Status: DC
  Filled 2015-07-30: qty 0.5

## 2015-07-30 MED ORDER — CLONAZEPAM 0.5 MG PO TABS
0.5000 mg | ORAL_TABLET | Freq: Two times a day (BID) | ORAL | Status: DC
  Administered 2015-07-30: 0.5 mg via ORAL
  Filled 2015-07-30: qty 1

## 2015-07-30 MED ORDER — MAGNESIUM HYDROXIDE 400 MG/5ML PO SUSP: 30.0000 mL | Freq: Every day | ORAL | Status: DC | PRN

## 2015-07-30 MED ORDER — ATORVASTATIN CALCIUM 20 MG PO TABS
20.0000 mg | ORAL_TABLET | Freq: Every day | ORAL | Status: DC
  Administered 2015-07-31 – 2015-08-02 (×3): 20 mg via ORAL
  Filled 2015-07-30 (×3): qty 1

## 2015-07-30 MED ORDER — PREGABALIN 50 MG PO CAPS
100.0000 mg | ORAL_CAPSULE | Freq: Three times a day (TID) | ORAL | Status: DC
  Administered 2015-07-30: 100 mg via ORAL
  Filled 2015-07-30: qty 2

## 2015-07-30 MED ORDER — MIRTAZAPINE 15 MG PO TABS
45.0000 mg | ORAL_TABLET | Freq: Every day | ORAL | Status: DC
  Administered 2015-07-30: 45 mg via ORAL
  Filled 2015-07-30: qty 3

## 2015-07-30 MED ORDER — MELOXICAM 7.5 MG PO TABS
7.5000 mg | ORAL_TABLET | Freq: Every day | ORAL | Status: DC
  Filled 2015-07-30: qty 1

## 2015-07-30 MED ORDER — METFORMIN HCL ER 500 MG PO TB24
500.0000 mg | ORAL_TABLET | Freq: Two times a day (BID) | ORAL | Status: DC
  Filled 2015-07-30: qty 1

## 2015-07-30 MED ORDER — INSULIN DETEMIR 100 UNIT/ML ~~LOC~~ SOLN
50.0000 [IU] | SUBCUTANEOUS | Status: DC
  Administered 2015-07-31 – 2015-08-03 (×4): 50 [IU] via SUBCUTANEOUS
  Filled 2015-07-30 (×5): qty 0.5

## 2015-07-30 NOTE — Consult Note (Signed)
[facility] Face-to-Face Psychiatry Consult   Reason for Consult:  Consult for 55-year-old woman with a history of schizoaffective disorder. Overdose. Referring Physician:  [place] Patient Identification: Cristina [name] MRN:  [id number] Principal Diagnosis: Schizoaffective disorder, bipolar type (HCC) Diagnosis:   Patient Active Problem List   Diagnosis Date Noted  . Aspirin overdose [T39.011A] 07/30/2015  . Suicidal ideation [R45.851] 07/30/2015  . Schizoaffective disorder, bipolar type (HCC) [F25.0] 12/05/2014  . COPD bronchitis [J44.9] 12/05/2014  . HTN (hypertension) [I10] 12/05/2014  . Sleep apnea [G47.30] 12/05/2014  . Diabetes (HCC) [E11.9] 12/05/2014  . Incomplete bladder emptying [R33.9] 12/04/2014  . Recurrent UTI [N39.0] 12/04/2014  . Chest pain [R07.9] 09/23/2014    Total Time spent with patient: 1 hour  Subjective:   Cristina [name] is a 55 y.o. female patient admitted with "I took a lot of pain medicine".  HPI:  Patient interviewed. Chart reviewed. Vitals reviewed. This is a 55-year-old woman with a history of schizoaffective disorder who comes into the hospital after overdosing on BC powders. She tells me that she took at least 20 of them today. When she first came in she had told some people that it was because of pain in her bladder. She tells me at first that it is because pain in her back but then tells me is because she is hopeless and feels like things will never get better. Rather than simply saying she took a lot of medicine she continues to say that she overdosed. Patient admits that she's been depressed and has had suicidal thoughts. Mood has been down consistently much worse for the last few weeks. Sleep is poor energy level is poor motivation is poor. She feels lonely most of the time. Denies any psychotic symptoms. She says she has been compliant with her medicine.  Social history: Patient lives by herself in supervised housing. Says that she is pretty lonely most of the  time although she does have family.  Medical history: Multiple medical problems including chronic back pain, chronic bladder pain, neurogenic bladder with a need to catheterize herself, diabetes, COPD, hypertension, sleep apnea  Substance abuse history: Denies any alcohol or drug abuse  Past Psychiatric History: Patient has a history of recurrent depressive symptoms. This is at least the third time she has overdosed on over-the-counter medicines. Admits that she stays depressed. She does take psychiatric medicines regularly along with the rest of her medicine. Has had several prior hospital stays.  Risk to Self: Is patient at risk for suicide?: No Risk to Others:   Prior Inpatient Therapy:   Prior Outpatient Therapy:    Past Medical History:  Past Medical History  Diagnosis Date  . Asthma   . CHF (congestive heart failure) (H[facility])   . Diabetes mellitus without complication (HCC)   . History of cholecystectomy     hx of cholecystectomy  . H/O tubal ligation     hx btl  . Hypertension   . Stroke ([facility])   . Abdominal hernia   . Fatty tumor   . Sleep apnea   . COPD (chronic obstructive pulmonary disease) (H[facility])   . H/O colonoscopy   . Bladder dysfunction   . Muscle cramps   . Vaginitis   . Recurrent boils   . Dyspnea   . Edema   . DDD (degenerative disc disease), lumbar   . UTI (lower urinary tract infection)   . Carpal tunnel syndrome   . Neuropathy (HCC)   . Obesity   . Arthritis   .   Schizoaffective disorder ([place])   . GERD (gastroesophageal reflux disease)   . HLD (hyperlipidemia)     Past Surgical History  Procedure Laterality Date  . Cholecystectomy    . Tubal ligation    . Cardiac catheterization Left 09/24/2014    Procedure: Left Heart Cath and Coronary Angiography;  Surgeon: Dionisio David, MD;  Location: [facility];  Service: Cardiovascular;  Laterality: Left;   Family History:  Family History  Problem Relation Age of Onset  . CAD Mother    Family  Psychiatric  History: Patient says both of her parents had schizophrenia. Social History:  History  Alcohol Use No     History  Drug Use No    Social History   Social History  . Marital Status: Single    Spouse Name: N/A  . Number of Children: N/A  . Years of Education: N/A   Social History Main Topics  . Smoking status: Former Research scientist (life sciences)  . Smokeless tobacco: None  . Alcohol Use: No  . Drug Use: No  . Sexual Activity: Not Asked   Other Topics Concern  . None   Social History Narrative   Additional Social History:    Allergies:   Allergies  Allergen Reactions  . Shellfish-Derived Products Anaphylaxis and Shortness Of Breath    Stated by Patient  . Thorazine [Chlorpromazine] Swelling  . Tylenol With Codeine #3 [Acetaminophen-Codeine] Other (See Comments)    Hallucinations    Labs:  Results for orders placed or performed during the hospital encounter of 07/30/15 (from the past 48 hour(s))  Acetaminophen level     Status: Abnormal   Collection Time: 07/30/15  3:54 PM  Result Value Ref Range   Acetaminophen (Tylenol), Serum <10 (L) 10 - 30 ug/mL    Comment:        THERAPEUTIC CONCENTRATIONS VARY SIGNIFICANTLY. A RANGE OF 10-30 ug/mL MAY BE AN EFFECTIVE CONCENTRATION FOR MANY PATIENTS. HOWEVER, SOME ARE BEST TREATED AT CONCENTRATIONS OUTSIDE THIS RANGE. ACETAMINOPHEN CONCENTRATIONS >150 ug/mL AT 4 HOURS AFTER INGESTION AND >50 ug/mL AT 12 HOURS AFTER INGESTION ARE OFTEN ASSOCIATED WITH TOXIC REACTIONS.   Comprehensive metabolic panel     Status: Abnormal   Collection Time: 07/30/15  3:54 PM  Result Value Ref Range   Sodium 139 135 - 145 mmol/L   Potassium 3.7 3.5 - 5.1 mmol/L   Chloride 100 (L) 101 - 111 mmol/L   CO2 28 22 - 32 mmol/L   Glucose, Bld 108 (H) 65 - 99 mg/dL   BUN 11 6 - 20 mg/dL   Creatinine, Ser 0.82 0.44 - 1.00 mg/dL   Calcium 8.7 (L) 8.9 - 10.3 mg/dL   Total Protein 7.2 6.5 - 8.1 g/dL   Albumin 3.8 3.5 - 5.0 g/dL   AST 16 15 - 41 U/L    ALT 22 14 - 54 U/L   Alkaline Phosphatase 118 38 - 126 U/L   Total Bilirubin <0.1 (L) 0.3 - 1.2 mg/dL   GFR calc non Af Amer >60 >60 mL/min   GFR calc Af Amer >60 >60 mL/min    Comment: (NOTE) The eGFR has been calculated using the CKD EPI equation. This calculation has not been validated in all clinical situations. eGFR's persistently <60 mL/min signify possible Chronic Kidney Disease.    Anion gap 11 5 - 15  Ethanol     Status: None   Collection Time: 07/30/15  3:54 PM  Result Value Ref Range   Alcohol, Ethyl (B) <5 <5  mg/dL    Comment:        LOWEST DETECTABLE LIMIT FOR SERUM ALCOHOL IS 5 mg/dL FOR MEDICAL PURPOSES ONLY   Lipase, blood     Status: None   Collection Time: 07/30/15  3:54 PM  Result Value Ref Range   Lipase 17 11 - 51 U/L  Salicylate level     Status: Abnormal   Collection Time: 07/30/15  3:54 PM  Result Value Ref Range   Salicylate Lvl 37.2 (HH) 2.8 - 30.0 mg/dL    Comment: CRITICAL RESULT CALLED TO, READ BACK BY AND VERIFIED WITH ANNA HOLT AT 1655 07/30/15 MLZ   Urinalysis complete, with microscopic     Status: Abnormal   Collection Time: 07/30/15  3:54 PM  Result Value Ref Range   Color, Urine COLORLESS (A) YELLOW   APPearance CLEAR (A) CLEAR   Glucose, UA NEGATIVE NEGATIVE mg/dL   Bilirubin Urine NEGATIVE NEGATIVE   Ketones, ur NEGATIVE NEGATIVE mg/dL   Specific Gravity, Urine 1.002 (L) 1.005 - 1.030   Hgb urine dipstick NEGATIVE NEGATIVE   pH 6.0 5.0 - 8.0   Protein, ur NEGATIVE NEGATIVE mg/dL   Nitrite NEGATIVE NEGATIVE   Leukocytes, UA TRACE (A) NEGATIVE   RBC / HPF NONE SEEN 0 - 5 RBC/hpf   WBC, UA 0-5 0 - 5 WBC/hpf   Bacteria, UA RARE (A) NONE SEEN   Squamous Epithelial / LPF NONE SEEN NONE SEEN  Urine Drug Screen, Qualitative     Status: None   Collection Time: 07/30/15  3:54 PM  Result Value Ref Range   Tricyclic, Ur Screen NONE DETECTED NONE DETECTED   Amphetamines, Ur Screen NONE DETECTED NONE DETECTED   MDMA (Ecstasy)Ur Screen  NONE DETECTED NONE DETECTED   Cocaine Metabolite,Ur  NONE DETECTED NONE DETECTED   Opiate, Ur Screen NONE DETECTED NONE DETECTED   Phencyclidine (PCP) Ur S NONE DETECTED NONE DETECTED   Cannabinoid 50 Ng, Ur  NONE DETECTED NONE DETECTED   Barbiturates, Ur Screen NONE DETECTED NONE DETECTED   Benzodiazepine, Ur Scrn NONE DETECTED NONE DETECTED   Methadone Scn, Ur NONE DETECTED NONE DETECTED    Comment: (NOTE) 100  Tricyclics, urine               Cutoff 1000 ng/mL 200  Amphetamines, urine             Cutoff 1000 ng/mL 300  MDMA (Ecstasy), urine           Cutoff 500 ng/mL 400  Cocaine Metabolite, urine       Cutoff 300 ng/mL 500  Opiate, urine                   Cutoff 300 ng/mL 600  Phencyclidine (PCP), urine      Cutoff 25 ng/mL 700  Cannabinoid, urine              Cutoff 50 ng/mL 800  Barbiturates, urine             Cutoff 200 ng/mL 900  Benzodiazepine, urine           Cutoff 200 ng/mL 1000 Methadone, urine                Cutoff 300 ng/mL 1100 1200 The urine drug screen provides only a preliminary, unconfirmed 1300 analytical test result and should not be used for non-medical 1400 purposes. Clinical consideration and professional judgment should 1500 be applied to any positive drug screen result due to possible 1600   interfering substances. A more specific alternate chemical method 1700 must be used in order to obtain a confirmed analytical result.  1800 Gas chromato graphy / mass spectrometry (GC/MS) is the preferred 1900 confirmatory method.   Blood gas, venous     Status: Abnormal (Preliminary result)   Collection Time: 07/30/15  4:37 PM  Result Value Ref Range   pH, Ven 7.42 7.320 - 7.430   pCO2, Ven 56 44.0 - 60.0 mmHg   pO2, Ven <31.0 (L) 31.0 - 45.0 mmHg    Comment: VENOUS   Bicarbonate 36.3 (H) 21.0 - 28.0 mEq/L   Acid-Base Excess 10.3 (H) 0.0 - 3.0 mmol/L   O2 Saturation PENDING %   Patient temperature 37.0    Collection site VEIN    Sample type VENOUS      Current Facility-Administered Medications  Medication Dose Route Frequency Provider Last Rate Last Dose  . albuterol (PROVENTIL HFA;VENTOLIN HFA) 108 (90 Base) MCG/ACT inhaler 2 puff  2 puff Inhalation Q4H PRN  T , MD      . aspirin EC tablet 81 mg  81 mg Oral Daily  T , MD      . [START ON 07/31/2015] atorvastatin (LIPITOR) tablet 20 mg  20 mg Oral q1800  T , MD      . clonazePAM (KLONOPIN) tablet 0.5 mg  0.5 mg Oral BID  T , MD      . [START ON 07/31/2015] cyclobenzaprine (FLEXERIL) tablet 10 mg  10 mg Oral BID AC  T , MD      . diclofenac sodium (VOLTAREN) 1 % transdermal gel 2 g  2 g Topical QID PRN  T , MD      . furosemide (LASIX) tablet 40 mg  40 mg Oral Daily  T , MD      . [START ON 07/31/2015] insulin detemir (LEVEMIR) injection 50 Units  50 Units Subcutaneous [id number]  T , MD      . insulin detemir (LEVEMIR) injection 52 Units  52 Units Subcutaneous QHS  T , MD      . lidocaine (LIDODERM) 5 % 1 patch  1 patch Transdermal Q24H  T , MD      . linagliptin (TRADJENTA) tablet 5 mg  5 mg Oral Daily  T , MD      . lisinopril (PRINIVIL,ZESTRIL) tablet 20 mg  20 mg Oral Daily  T , MD      . loratadine (CLARITIN) tablet 10 mg  10 mg Oral Daily  T , MD      . meloxicam (MOBIC) tablet 7.5 mg  7.5 mg Oral Daily  T , MD      . [START ON 07/31/2015] metFORMIN (GLUCOPHAGE-XR) 24 hr tablet 500 mg  500 mg Oral BID WC  T , MD      . metoprolol (LOPRESSOR) tablet 50 mg  50 mg Oral BID  T , MD      . mirtazapine (REMERON) tablet 45 mg  45 mg Oral QHS  T , MD      . mometasone-formoterol (DULERA) 200-5 MCG/ACT inhaler 2 puff  2 puff Inhalation BID  T , MD      . pantoprazole (PROTONIX) EC tablet 40 mg  40 mg Oral Daily  T , MD      . pregabalin (LYRICA) capsule 100 mg  100 mg Oral TID  T ,  MD      . tiotropium (SPIRIVA) inhalation capsule 18 mcg  18 mcg Inhalation   Daily Gonzella Lex, MD      . traZODone (DESYREL) tablet 100 mg  100 mg Oral QHS Gonzella Lex, MD       Current Outpatient Prescriptions  Medication Sig Dispense Refill  . albuterol (PROVENTIL HFA;VENTOLIN HFA) 108 (90 BASE) MCG/ACT inhaler Inhale 2 puffs into the lungs every 6 (six) hours as needed for wheezing or shortness of breath.    Marland Kitchen aspirin EC 81 MG tablet Take 81 mg by mouth daily.    Marland Kitchen atorvastatin (LIPITOR) 20 MG tablet Take 20 mg by mouth at bedtime.    . cetirizine (ZYRTEC) 10 MG tablet Take 10 mg by mouth daily as needed for allergies.    . clonazePAM (KLONOPIN) 0.5 MG tablet Take 0.5 mg by mouth 2 (two) times daily.    . cyclobenzaprine (FLEXERIL) 10 MG tablet Take 10 mg by mouth 2 (two) times daily.    . diclofenac sodium (VOLTAREN) 1 % GEL Apply 2 g topically 4 (four) times daily as needed (for pain).    Marland Kitchen esomeprazole (NEXIUM) 40 MG capsule Take 40 mg by mouth daily.     . Fluticasone-Salmeterol (ADVAIR) 250-50 MCG/DOSE AEPB Inhale 1 puff into the lungs 2 (two) times daily.    . furosemide (LASIX) 40 MG tablet Take 80 mg by mouth daily.    . haloperidol decanoate (HALDOL DECANOATE) 100 MG/ML injection Inject 0.5 mLs (50 mg total) into the muscle every 30 (thirty) days. 1 mL 0  . insulin detemir (LEVEMIR) 100 UNIT/ML injection Inject 50-52 Units into the skin 2 (two) times daily. Pt uses 50 units in the morning and 52 units at bedtime.    Marland Kitchen ketoconazole (NIZORAL) 2 % shampoo Apply 1 application topically every other day.    . lidocaine (LIDODERM) 5 % Place 1 patch onto the skin every 12 (twelve) hours. Remove & Discard patch within 12 hours or as directed by MD    . linagliptin (TRADJENTA) 5 MG TABS tablet Take 5 mg by mouth daily.    . meloxicam (MOBIC) 7.5 MG tablet Take 7.5 mg by mouth 2 (two) times daily.    . metFORMIN (GLUCOPHAGE-XR) 500 MG 24 hr tablet Take 1,000 mg by mouth 2 (two) times  daily.    . metoprolol (LOPRESSOR) 50 MG tablet Take 50 mg by mouth 2 (two) times daily.    . mirtazapine (REMERON) 45 MG tablet Take 45 mg by mouth at bedtime.    . pregabalin (LYRICA) 100 MG capsule Take 100 mg by mouth 3 (three) times daily as needed (for nerve pain).    . quinapril (ACCUPRIL) 20 MG tablet Take 20 mg by mouth daily.    Marland Kitchen tiotropium (SPIRIVA) 18 MCG inhalation capsule Place 18 mcg into inhaler and inhale daily.    . traZODone (DESYREL) 100 MG tablet Take 1 tablet (100 mg total) by mouth at bedtime as needed for sleep. 30 tablet 0    Musculoskeletal: Strength & Muscle Tone: decreased Gait & Station: unable to stand Patient leans: N/A  Psychiatric Specialty Exam: Physical Exam  Nursing note and vitals reviewed. Constitutional: She appears well-developed and well-nourished.  HENT:  Head: Normocephalic and atraumatic.  Eyes: Conjunctivae are normal. Pupils are equal, round, and reactive to light.  Neck: Normal range of motion.  Cardiovascular: Normal heart sounds.   Respiratory: Effort normal. No respiratory distress.  GI: Soft.  Musculoskeletal: Normal range of motion.  Neurological: She is alert.  Skin: Skin is warm and dry.  Psychiatric: Her speech  is delayed. She is slowed. Cognition and memory are normal. She expresses inappropriate judgment. She exhibits a depressed mood. She expresses suicidal ideation.    Review of Systems  Constitutional: Negative.   HENT: Negative.   Eyes: Negative.   Respiratory: Negative.   Cardiovascular: Negative.   Gastrointestinal: Negative.   Musculoskeletal: Negative.   Skin: Negative.   Neurological: Negative.   Psychiatric/Behavioral: Positive for depression and suicidal ideas. Negative for hallucinations, memory loss and substance abuse. The patient is nervous/anxious and has insomnia.     Blood pressure 140/66, pulse 92, resp. rate 18, SpO2 97 %.There is no weight on file to calculate BMI.  General Appearance: Disheveled   Eye Contact:  Fair  Speech:  Slow  Volume:  Decreased  Mood:  Dysphoric  Affect:  Constricted  Thought Process:  Goal Directed  Orientation:  Full (Time, Place, and Person)  Thought Content:  Logical  Suicidal Thoughts:  Yes.  with intent/plan  Homicidal Thoughts:  No  Memory:  Immediate;   Good Recent;   Fair Remote;   Fair  Judgement:  Impaired  Insight:  Lacking  Psychomotor Activity:  Decreased  Concentration:  Concentration: Fair  Recall:  Fair  Fund of Knowledge:  Fair  Language:  Fair  Akathisia:  No  Handed:  Right  AIMS (if indicated):     Assets:  Desire for Improvement Housing Social Support  ADL's:  Impaired  Cognition:  WNL  Sleep:        Treatment Plan Summary: Daily contact with patient to assess and evaluate symptoms and progress in treatment, Medication management and Plan Patient will be continued on all of her multiple medications. Continue IVC. Admit to psychiatry once she is medically stabilized. Patient admits to recent suicidal ideation and suicide attempt but her salicylate level was only moderate elevated and there is no sign of elevated acetaminophen. Vital signs currently stable. Defer further decisions medically to the treatment team in the emergency room. Follow-up daily if she stays in the emergency room otherwise I have gone ahead and put in admission orders. She will be on 15 minute checks. I will get her blood sugar checked 4 times a day. Check hemoglobin A1c.  Disposition: Recommend psychiatric Inpatient admission when medically cleared. Supportive therapy provided about ongoing stressors.   , MD 07/30/2015 6:56 PM  

## 2015-07-30 NOTE — ED Notes (Signed)
Pt was given supper tray. Pt ambulated to bathroom and is back in bed resting at this time.

## 2015-07-30 NOTE — ED Notes (Signed)
BEHAVIORAL HEALTH ROUNDING Patient sleeping: No. Patient alert and oriented: yes Behavior appropriate: Yes.  ; If no, describe:  Nutrition and fluids offered: Yes  Toileting and hygiene offered: Yes  Sitter present: q 15 minute checks Law enforcement present: Yes  

## 2015-07-30 NOTE — BH Assessment (Signed)
Assessment Note  Cristina [name] is an 55 y.o. female, with a history of schizoaffective disorder, presenting to the ED after overdosing on BC powders. Patient reports taking at least 20 of them today. She reports the [facility] powders because of pain in her bladder. Pt admits endorses symptoms of depression and states she  is hopeless and feels like things will never get better. She states that her mood has been down as well as a lack of motivation.  Pt state she is complaint with her medications.  She denies any drug/alcohol use.   Pt has been accepted for inpatient psychiatric admission at [facility].  Diagnosis: Schizoaffective Disorder  Past Medical History:  Past Medical History  Diagnosis Date  . Asthma   . CHF (congestive heart failure) ([place])   . Diabetes mellitus without complication ([place])   . History of cholecystectomy     hx of cholecystectomy  . H/O tubal ligation     hx btl  . Hypertension   . Stroke ([place])   . Abdominal hernia   . Fatty tumor   . Sleep apnea   . COPD (chronic obstructive pulmonary disease) ([place])   . H/O colonoscopy   . Bladder dysfunction   . Muscle cramps   . Vaginitis   . Recurrent boils   . Dyspnea   . Edema   . DDD (degenerative disc disease), lumbar   . UTI (lower urinary tract infection)   . Carpal tunnel syndrome   . Neuropathy ([place])   . Obesity   . Arthritis   . Schizoaffective disorder ([place])   . GERD (gastroesophageal reflux disease)   . HLD (hyperlipidemia)     Past Surgical History  Procedure Laterality Date  . Cholecystectomy    . Tubal ligation    . Cardiac catheterization Left 09/24/2014    Procedure: Left Heart Cath and Coronary Angiography;  Surgeon: Dionisio David, MD;  Location: [place] [facility];  Service: Cardiovascular;  Laterality: Left;    Family History:  Family History  Problem Relation Age of Onset  . CAD Mother     Social History:  reports that she has quit smoking. She does not have any smokeless tobacco history  on file. She reports that she does not drink alcohol or use illicit drugs.  Additional Social History:  Alcohol / Drug Use History of alcohol / drug use?: No history of alcohol / drug abuse  CIWA: CIWA-Ar BP: 113/62 mmHg Pulse Rate: 92 COWS:    Allergies:  Allergies  Allergen Reactions  . Shellfish-Derived Products Anaphylaxis and Shortness Of Breath    Stated by Patient  . Thorazine [Chlorpromazine] Swelling  . Tylenol With Codeine #3 [Acetaminophen-Codeine] Other (See Comments)    Hallucinations    Home Medications:  (Not in a hospital admission)  OB/GYN Status:  No LMP recorded. Patient is postmenopausal.  General Assessment Data Location of Assessment: [facility] ED TTS Assessment: In system Is this a Tele or Face-to-Face Assessment?: Face-to-Face Is this an Initial Assessment or a Re-assessment for this encounter?: Initial Assessment Marital status: Single Maiden name: N/A Is patient pregnant?: No Pregnancy Status: No Living Arrangements: Alone Can pt return to current living arrangement?: Yes Admission Status: Involuntary Is patient capable of signing voluntary admission?: No Referral Source: Self/Family/Friend Insurance type: Medicare  Medical Screening Exam ([place]) Medical Exam completed: Yes  Crisis Care Plan Living Arrangements: Alone Legal Guardian: Other: (self) Name of Psychiatrist: N/A Name of Therapist: N/A  Education Status Is patient currently  in school?: No Current Grade: N/A Highest grade of school patient has completed: N/A Name of school: N/A Contact person: N/A  Risk to self with the past 6 months Suicidal Ideation: Yes-Currently Present Has patient been a risk to self within the past 6 months prior to admission? : No Suicidal Intent: No Has patient had any suicidal intent within the past 6 months prior to admission? : No Is patient at risk for suicide?: No Suicidal Plan?: No Has patient had any suicidal plan within the past 6  months prior to admission? : No Access to Means: Yes Specify Access to Suicidal Means: Pt has access to medications What has been your use of drugs/alcohol within the last 12 months?: None reported by patient Previous Attempts/Gestures: No How many times?: 0 Other Self Harm Risks: 0 Triggers for Past Attempts: None known Intentional Self Injurious Behavior: None Family Suicide History: No Recent stressful life event(s): Other (Comment) Persecutory voices/beliefs?: No Depression: Yes Depression Symptoms: Loss of interest in usual pleasures, Feeling worthless/self pity Substance abuse history and/or treatment for substance abuse?: No Suicide prevention information given to non-admitted patients: Not applicable  Risk to Others within the past 6 months Homicidal Ideation: No Does patient have any lifetime risk of violence toward others beyond the six months prior to admission? : No Thoughts of Harm to Others: No Current Homicidal Intent: No Current Homicidal Plan: No Access to Homicidal Means: No Identified Victim: None identified History of harm to others?: No Assessment of Violence: None Noted Violent Behavior Description: None identified Does patient have access to weapons?: No Criminal Charges Pending?: No Does patient have a court date: No Is patient on probation?: No  Psychosis Hallucinations: None noted Delusions: None noted  Mental Status Report Appearance/Hygiene: In scrubs Eye Contact: Good Motor Activity: Freedom of movement Speech: Logical/coherent Level of Consciousness: Alert Mood: Depressed, Anxious Affect: Anxious, Appropriate to circumstance, Depressed Anxiety Level: Minimal Thought Processes: Relevant Judgement: Partial Orientation: Person, Place, Time Obsessive Compulsive Thoughts/Behaviors: Minimal  Cognitive Functioning Concentration: Good Memory: Recent Intact, Remote Intact IQ: Average Insight: Fair Impulse Control: Fair Appetite:  Fair Weight Loss: 0 Weight Gain: 0 Sleep: No Change Vegetative Symptoms: None  ADLScreening [facility] Assessment Services) Patient's cognitive ability adequate to safely complete daily activities?: Yes Patient able to express need for assistance with ADLs?: Yes Independently performs ADLs?: Yes (appropriate for developmental age)  Prior Inpatient Therapy Prior Inpatient Therapy: No Prior Therapy Dates: N/A Prior Therapy Facilty/Provider(s): N/A Reason for Treatment: N/A  Prior Outpatient Therapy Prior Outpatient Therapy: No Prior Therapy Dates: N/A Prior Therapy Facilty/Provider(s): N/A Reason for Treatment: N/A Does patient have an ACCT team?: No Does patient have Intensive In-House Services?  : No Does patient have [facility] services? : No Does patient have P4CC services?: No  ADL Screening (condition at time of admission) Patient's cognitive ability adequate to safely complete daily activities?: Yes Patient able to express need for assistance with ADLs?: Yes Independently performs ADLs?: Yes (appropriate for developmental age)       Abuse/Neglect Assessment (Assessment to be complete while patient is alone) Physical Abuse: Denies Verbal Abuse: Denies Sexual Abuse: Denies Exploitation of patient/patient's resources: Denies Self-Neglect: Denies Values / Beliefs Cultural Requests During Hospitalization: None Spiritual Requests During Hospitalization: None Consults Spiritual Care Consult Needed: No Social Work Consult Needed: No Regulatory affairs officer (For Healthcare) Does patient have an advance directive?: No    Additional Information 1:1 In Past 12 Months?: No CIRT Risk: No Elopement Risk: No Does patient have medical clearance?:  Yes     Disposition:  Disposition Initial Assessment Completed for this Encounter: Yes Disposition of Patient: Inpatient treatment program Type of inpatient treatment program: Adult (Pt has been accepted at [facility] for placement.)  On  Site Evaluation by:   Reviewed with Physician:    Oneita Hurt 07/30/2015 10:31 PM

## 2015-07-30 NOTE — Progress Notes (Signed)
Skin assessment performed and no contraband found. Pt's right lower leg is visibly swollen and pink. Pt c/o pain when it is touched. Skin is warm and dry.

## 2015-07-30 NOTE — ED Notes (Signed)
Patient reports that she feels lonely and in pain all the time at home.  Patient usually walks with a walker and has home health and she feels like her daughters have abandoned her.  Patient also states she is upset because her daughters do not get along with each other because one daughter is married to a woman and the other daughter, "doesn't believe in that."

## 2015-07-30 NOTE — ED Notes (Signed)
Patient refusing to dress out or allow [place] to draw blood work.  Patient is tearful and seems angry.

## 2015-07-30 NOTE — ED Notes (Signed)
Pt assisted to restroom by ED tech julie. Walker used for pt safety. IV fluids infusing.  No acute distress noted. Provided for comfort and safety and will continue to assess.

## 2015-07-30 NOTE — ED Provider Notes (Signed)
[facility] Emergency Department Provider Note  ____________________________________________  Time seen: 3:20 PM  I have reviewed the triage vital signs and the nursing notes.   HISTORY  Chief Complaint Drug Overdose Level 5 caveat:  Portions of the history and physical were unable to be obtained due to the patient's being a poor historian    HPI Cristina [name] is a 55 y.o. female who complains of chronic low back pain. She reports that today she was "tired of living with the pain", so she started taking Goody powders when she woke up this morning and just kept taking them intermittently all day. She denies stomach upset nausea vomiting diarrhea or bloody stools. Denies fever headache and ringing in the ears or dizziness. Patient is not very cooperative. Patient is evasive when asked about intent and if she was trying to kill herself. She does have a history of suicide attempt in the past. She will not answer if she is currently taking medications for her known bipolar disorder.  Patient is unable to say how many Goody powder she took. She thinks it was close to a full box.   Past Medical History  Diagnosis Date  . Asthma   . CHF (congestive heart failure) ([place])   . Diabetes mellitus without complication ([place])   . History of cholecystectomy     hx of cholecystectomy  . H/O tubal ligation     hx btl  . Hypertension   . Stroke ([place])   . Abdominal hernia   . Fatty tumor   . Sleep apnea   . COPD (chronic obstructive pulmonary disease) ([place])   . H/O colonoscopy   . Bladder dysfunction   . Muscle cramps   . Vaginitis   . Recurrent boils   . Dyspnea   . Edema   . DDD (degenerative disc disease), lumbar   . UTI (lower urinary tract infection)   . Carpal tunnel syndrome   . Neuropathy ([place])   . Obesity   . Arthritis   . Schizoaffective disorder ([place])   . GERD (gastroesophageal reflux disease)   . HLD (hyperlipidemia)      Patient Active Problem  List   Diagnosis Date Noted  . Aspirin overdose 07/30/2015  . Suicidal ideation 07/30/2015  . Schizoaffective disorder, bipolar type ([place]) 12/05/2014  . COPD bronchitis 12/05/2014  . HTN (hypertension) 12/05/2014  . Sleep apnea 12/05/2014  . Diabetes ([place]) 12/05/2014  . Incomplete bladder emptying 12/04/2014  . Recurrent UTI 12/04/2014  . Chest pain 09/23/2014     Past Surgical History  Procedure Laterality Date  . Cholecystectomy    . Tubal ligation    . Cardiac catheterization Left 09/24/2014    Procedure: Left Heart Cath and Coronary Angiography;  Surgeon: Dionisio David, MD;  Location: [place] [facility];  Service: Cardiovascular;  Laterality: Left;     Current Outpatient Rx  Name  Route  Sig  Dispense  Refill  . albuterol (PROVENTIL HFA;VENTOLIN HFA) 108 (90 BASE) MCG/ACT inhaler   Inhalation   Inhale 2 puffs into the lungs every 6 (six) hours as needed for wheezing or shortness of breath.         Marland Kitchen aspirin EC 81 MG tablet   Oral   Take 81 mg by mouth daily.         Marland Kitchen atorvastatin (LIPITOR) 20 MG tablet   Oral   Take 20 mg by mouth at bedtime.         . cetirizine (  ZYRTEC) 10 MG tablet   Oral   Take 10 mg by mouth daily as needed for allergies.         . clonazePAM (KLONOPIN) 0.5 MG tablet   Oral   Take 0.5 mg by mouth 2 (two) times daily.         . cyclobenzaprine (FLEXERIL) 10 MG tablet   Oral   Take 10 mg by mouth 2 (two) times daily.         . diclofenac sodium (VOLTAREN) 1 % GEL   Topical   Apply 2 g topically 4 (four) times daily as needed (for pain).         Marland Kitchen esomeprazole (NEXIUM) 40 MG capsule   Oral   Take 40 mg by mouth daily.          . Fluticasone-Salmeterol (ADVAIR) 250-50 MCG/DOSE AEPB   Inhalation   Inhale 1 puff into the lungs 2 (two) times daily.         . furosemide (LASIX) 40 MG tablet   Oral   Take 80 mg by mouth daily.         . haloperidol decanoate (HALDOL DECANOATE) 100 MG/ML injection    Intramuscular   Inject 0.5 mLs (50 mg total) into the muscle every 30 (thirty) days.   1 mL   0   . insulin detemir (LEVEMIR) 100 UNIT/ML injection   Subcutaneous   Inject 50-52 Units into the skin 2 (two) times daily. Pt uses 50 units in the morning and 52 units at bedtime.         Marland Kitchen ketoconazole (NIZORAL) 2 % shampoo   Topical   Apply 1 application topically every other day.         . lidocaine (LIDODERM) 5 %   Transdermal   Place 1 patch onto the skin every 12 (twelve) hours. Remove & Discard patch within 12 hours or as directed by MD         . linagliptin (TRADJENTA) 5 MG TABS tablet   Oral   Take 5 mg by mouth daily.         . meloxicam (MOBIC) 7.5 MG tablet   Oral   Take 7.5 mg by mouth 2 (two) times daily.         . metFORMIN (GLUCOPHAGE-XR) 500 MG 24 hr tablet   Oral   Take 1,000 mg by mouth 2 (two) times daily.         . metoprolol (LOPRESSOR) 50 MG tablet   Oral   Take 50 mg by mouth 2 (two) times daily.         . mirtazapine (REMERON) 45 MG tablet   Oral   Take 45 mg by mouth at bedtime.         . pregabalin (LYRICA) 100 MG capsule   Oral   Take 100 mg by mouth 3 (three) times daily as needed (for nerve pain).         . quinapril (ACCUPRIL) 20 MG tablet   Oral   Take 20 mg by mouth daily.         Marland Kitchen tiotropium (SPIRIVA) 18 MCG inhalation capsule   Inhalation   Place 18 mcg into inhaler and inhale daily.         . traZODone (DESYREL) 100 MG tablet   Oral   Take 1 tablet (100 mg total) by mouth at bedtime as needed for sleep.   30 tablet   0  Allergies Shellfish-derived products; Thorazine; and Tylenol with codeine #3   Family History  Problem Relation Age of Onset  . CAD Mother     Social History Social History  Substance Use Topics  . Smoking status: Former Research scientist (life sciences)  . Smokeless tobacco: None  . Alcohol Use: No    Review of Systems  Constitutional:   No fever or chills.  Eyes:   No vision changes.  ENT:    No ringing in ears or hearing changes. Cardiovascular:   No chest pain. Respiratory:   No dyspnea or cough. Gastrointestinal:   Negative for abdominal pain, vomiting and diarrhea.  Genitourinary:   Negative for dysuria or difficulty urinating. Musculoskeletal:   Chronic low back pain Neurological:   Negative for headaches 10-point ROS otherwise negative.  ____________________________________________   PHYSICAL EXAM:  VITAL SIGNS: ED Triage Vitals  Enc Vitals Group     BP 07/30/15 1526 140/66 mmHg     Pulse Rate 07/30/15 1526 92     Resp 07/30/15 1526 18     Temp 07/30/15 2158 98.2 F (36.8 C)     Temp Source 07/30/15 2158 Oral     SpO2 07/30/15 1526 97 %     Weight --      Height --      Head Cir --      Peak Flow --      Pain Score --      Pain Loc --      Pain Edu? --      Excl. in [place]? --     Vital signs reviewed, nursing assessments reviewed.   Constitutional:   Alert and oriented. Well appearing and in no distress. Eyes:   No scleral icterus. No conjunctival pallor. PERRL. EOMI.  No nystagmus. ENT   Head:   Normocephalic and atraumatic.   Nose:   No congestion/rhinnorhea. No septal hematoma   Mouth/Throat:   MMM, no pharyngeal erythema. No peritonsillar mass.    Neck:   No stridor. No SubQ emphysema. No meningismus. Hematological/Lymphatic/Immunilogical:   No cervical lymphadenopathy. Cardiovascular:   RRR. Symmetric bilateral radial and DP pulses.  No murmurs.  Respiratory:   Normal respiratory effort without tachypnea nor retractions. Breath sounds are clear and equal bilaterally. No wheezes/rales/rhonchi. Gastrointestinal:   Soft and nontender. Non distended. There is no CVA tenderness.  No rebound, rigidity, or guarding. Genitourinary:   deferred Musculoskeletal:   Nontender with normal range of motion in all extremities. No joint effusions.  No lower extremity tenderness.  No edema. No midline spinal tenderness Neurologic:   Normal speech and  language.  CN 2-10 normal. Motor grossly intact. No gross focal neurologic deficits are appreciated.  Skin:    Skin is warm, dry and intact. No rash noted.  No petechiae, purpura, or bullae.  ____________________________________________    LABS (pertinent positives/negatives) (all labs ordered are listed, but only abnormal results are displayed) Labs Reviewed  ACETAMINOPHEN LEVEL - Abnormal; Notable for the following:    Acetaminophen (Tylenol), Serum <10 (*)    All other components within normal limits  COMPREHENSIVE METABOLIC PANEL - Abnormal; Notable for the following:    Chloride 100 (*)    Glucose, Bld 108 (*)    Calcium 8.7 (*)    Total Bilirubin <0.1 (*)    All other components within normal limits  SALICYLATE LEVEL - Abnormal; Notable for the following:    Salicylate Lvl 0000000 (*)    All other components within normal limits  BLOOD GAS,  VENOUS - Abnormal; Notable for the following:    pO2, Ven <31.0 (*)    Bicarbonate 36.3 (*)    Acid-Base Excess 10.3 (*)    All other components within normal limits  URINALYSIS COMPLETEWITH MICROSCOPIC ([facility] ONLY) - Abnormal; Notable for the following:    Color, Urine COLORLESS (*)    APPearance CLEAR (*)    Specific Gravity, Urine 1.002 (*)    Leukocytes, UA TRACE (*)    Bacteria, UA RARE (*)    All other components within normal limits  ETHANOL  LIPASE, BLOOD  URINE DRUG SCREEN, QUALITATIVE ([facility] ONLY)  SALICYLATE LEVEL  BLOOD GAS, VENOUS   ____________________________________________   EKG  Interpreted by me Normal sinus rhythm rate of 90, normal axis and intervals. Poor R-wave progression in anterior precordial leads. Normal ST segments and T waves  ____________________________________________    RADIOLOGY    ____________________________________________   PROCEDURES   ____________________________________________   INITIAL IMPRESSION / ASSESSMENT AND PLAN / ED COURSE  Pertinent labs & imaging results that  were available during my care of the patient were reviewed by me and considered in my medical decision making (see chart for details).  Patient well appearing no acute distress. Unable to modify exactly how many Goody powder she took but at 520 mg of aspirin per dose, this is potential for a very severe indigestion. Ingestion was not all at once or rather gradual and continuous over many hours today. No evidence of toxicity at this time.    ----------------------------------------- 11:27 PM on 07/30/2015 -----------------------------------------  Initial salicylate level 37. Repeat level IV hours later decreased to 28. PH remains normal.  Patient medically clear, transferred to behavioral health unit for further evaluation and management.     ____________________________________________   FINAL CLINICAL IMPRESSION(S) / ED DIAGNOSES  Final diagnoses:  Bipolar 1 disorder ([place])  Salicylate overdose, intentional self-harm, initial encounter [facility])       Portions of this note were generated with dragon dictation software. Dictation errors may occur despite best attempts at proofreading.   Carrie Mew, MD 07/30/15 2329

## 2015-07-30 NOTE — BHH Counselor (Signed)
Per Dr. Weber Cooks, pt meets criteria for inpatient psychiatric admission. Pt accepted for placement at [facility], assigned to bed 310. Attending physician is Dr. Jerilee Hoh.

## 2015-07-30 NOTE — ED Notes (Signed)
Patient's daughter took all of patient's belongings home.

## 2015-07-30 NOTE — ED Notes (Signed)
Pt from home via EMS pt reports shes taken a box of BC powder over the course of the day due to "bladder pain"  Denies SI/HI

## 2015-07-31 ENCOUNTER — Encounter: Payer: Self-pay | Admitting: Psychiatry

## 2015-07-31 DIAGNOSIS — F250 Schizoaffective disorder, bipolar type: Principal | ICD-10-CM

## 2015-07-31 DIAGNOSIS — J449 Chronic obstructive pulmonary disease, unspecified: Secondary | ICD-10-CM

## 2015-07-31 LAB — BLOOD GAS, VENOUS
Acid-Base Excess: 5.3 mmol/L — ABNORMAL HIGH (ref 0.0–3.0)
Bicarbonate: 32.7 mEq/L — ABNORMAL HIGH (ref 21.0–28.0)
Patient temperature: 37
pCO2, Ven: 62 mmHg — ABNORMAL HIGH (ref 44.0–60.0)
pH, Ven: 7.33 (ref 7.320–7.430)
pO2, Ven: <31 mmHg — ABNORMAL LOW (ref 31.0–45.0)

## 2015-07-31 LAB — GLUCOSE, CAPILLARY
Glucose-Capillary: 187 mg/dL — ABNORMAL HIGH (ref 65–99)
Glucose-Capillary: 118 mg/dL — ABNORMAL HIGH (ref 65–99)
Glucose-Capillary: 130 mg/dL — ABNORMAL HIGH (ref 65–99)
Glucose-Capillary: 120 mg/dL — ABNORMAL HIGH (ref 65–99)

## 2015-07-31 LAB — HEMOGLOBIN A1C: Hgb A1c MFr Bld: 6.6 % — ABNORMAL HIGH (ref 4.0–6.0)

## 2015-07-31 MED ORDER — CYCLOBENZAPRINE HCL 10 MG PO TABS
10.0000 mg | ORAL_TABLET | Freq: Three times a day (TID) | ORAL | Status: DC
  Administered 2015-07-31 – 2015-08-03 (×9): 10 mg via ORAL
  Filled 2015-07-31 (×9): qty 1

## 2015-07-31 MED ORDER — HALOPERIDOL 5 MG PO TABS
5.0000 mg | ORAL_TABLET | Freq: Every day | ORAL | Status: DC
  Administered 2015-07-31 – 2015-08-02 (×3): 5 mg via ORAL
  Filled 2015-07-31 (×3): qty 1

## 2015-07-31 MED ORDER — MELOXICAM 7.5 MG PO TABS
7.5000 mg | ORAL_TABLET | Freq: Two times a day (BID) | ORAL | Status: DC
  Administered 2015-07-31 – 2015-08-03 (×6): 7.5 mg via ORAL
  Filled 2015-07-31 (×6): qty 1

## 2015-07-31 NOTE — BHH Suicide Risk Assessment (Signed)
[facility] Admission Suicide Risk Assessment   Nursing information obtained from:  Patient, Review of record Demographic factors:  Living alone, Unemployed Current Mental Status:  Suicidal ideation indicated by patient, Suicide plan Loss Factors:  Decline in physical health Historical Factors:  Impulsivity Risk Reduction Factors:  Positive therapeutic relationship Janeice Robinson)  Total Time spent with patient: 1 hour Principal Problem: Schizoaffective disorder, bipolar type ([place]) Diagnosis:   Patient Active Problem List   Diagnosis Date Noted  . COPD (chronic obstructive pulmonary disease) ([place]) [J44.9] 07/31/2015  . Aspirin overdose [T39.011A] 07/30/2015  . Schizoaffective disorder, bipolar type ([place]) [F25.0] 12/05/2014  . HTN (hypertension) [I10] 12/05/2014  . Sleep apnea [G47.30] 12/05/2014  . Diabetes ([place]) [E11.9] 12/05/2014  . Incomplete bladder emptying [R33.9] 12/04/2014  . Recurrent UTI [N39.0] 12/04/2014  . Polyneuropathy ([place]) [G62.9] 03/20/2009  . Arthritis, degenerative [M19.90] 05/23/2007  . HLD (hyperlipidemia) [E78.5] 05/23/2007   Subjective Data:   Continued Clinical Symptoms:  Alcohol Use Disorder Identification Test Final Score (AUDIT): 0 The "Alcohol Use Disorders Identification Test", Guidelines for Use in Primary Care, Second Edition.  World Pharmacologist [facility]). Score between 0-7:  no or low risk or alcohol related problems. Score between 8-15:  moderate risk of alcohol related problems. Score between 16-19:  high risk of alcohol related problems. Score 20 or above:  warrants further diagnostic evaluation for alcohol dependence and treatment.   CLINICAL FACTORS:     Psychiatric Specialty Exam: Physical Exam  ROS  Blood pressure 136/88, pulse 108, temperature 99 F (37.2 C), temperature source Oral, resp. rate 20, height 4\' 9"  (1.448 m), weight 110.224 kg (243 lb), SpO2 93 %.Body mass index is 52.57 kg/(m^2).     COGNITIVE FEATURES THAT CONTRIBUTE  TO RISK:  None    SUICIDE RISK:   Moderate:  Frequent suicidal ideation with limited intensity, and duration, some specificity in terms of plans, no associated intent, good self-control, limited dysphoria/symptomatology, some risk factors present, and identifiable protective factors, including available and accessible social support.  PLAN OF CARE: admit to [facility]  I certify that inpatient services furnished can reasonably be expected to improve the patient's condition.   Hildred Priest, MD 07/31/2015, 2:58 PM

## 2015-07-31 NOTE — Tx Team (Signed)
Initial Interdisciplinary Treatment Plan   PATIENT STRESSORS: Health problems Marital or family conflict "My youngest daughter is married to another woman, she is happy but my older daughter is having problem with this, I leave alone, in pain, took a bunch of BC powder.."   PATIENT STRENGTHS: Ability for insight Capable of independent living Supportive family/friends   PROBLEM LIST: Problem List/Patient Goals Date to be addressed Date deferred Reason deferred Estimated date of resolution  Depressive Symptoms 07/30/2015     Suicidal Thoughts & Plans 6./13/2017     Diabetes [place] 07/30/2015                                          DISCHARGE CRITERIA:  Improved stabilization in mood, thinking, and/or behavior Motivation to continue treatment in a less acute level of care  PRELIMINARY DISCHARGE PLAN: Placement in alternative living arrangements  PATIENT/FAMIILY INVOLVEMENT: This treatment plan has been presented to and reviewed with the patient, Veronda Gazzillo.  The patient and family have been given the opportunity to ask questions and make suggestions.  Migdalia Olejniczak T Azarah Dacy 07/31/2015, 1:19 AM

## 2015-07-31 NOTE — [facility] Group Notes (Signed)
[facility] Notes:  (Nursing/MHT/Case Management/Adjunct)  Date:  07/31/2015  Time:  5:11 PM  Type of Therapy:  Psychoeducational Skills  Participation Level:  Did Not Attend    Drake Leach 07/31/2015, 5:11 PM

## 2015-07-31 NOTE — Progress Notes (Signed)
D:Patient voice she didn't over dose only took  B/C power to help with pain issue. Patient   Did not voice of daughter  marriage  Or if she approved .  Patient walking with a walker  On unit . Limited participation with unit participation D: Patient stated slept fair  last night .Stated appetite is  Fair  and energy level  low. Stated concentration is good . Stated on Depression scale , hopeless and anxiety .( low 0-10 high) Denies suicidal  homicidal ideations  .  No auditory hallucinations  No pain concerns . Appropriate ADL'S. Interacting with peers and staff. Working on coping skils . Voice of wanting to go home. A: Encourage patient participation with unit programming . Instruction  Given on  Medication , verbalize understanding. R: Voice no other concerns. Staff continue to monitor
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
## 2015-07-31 NOTE — Tx Team (Signed)
Interdisciplinary Treatment Plan Update (Adult)        Date: 07/31/2015   Time Reviewed: 9:30 AM   Progress in Treatment: Improving  Attending groups: Continuing to assess, patient new to milieu  Participating in groups: Continuing to assess, patient new to milieu  Taking medication as prescribed: Yes  Tolerating medication: Yes  Family/Significant other contact made: No, CSW assessing for appropriate contacts  Patient understands diagnosis: Yes  Discussing patient identified problems/goals with staff: Yes  Medical problems stabilized or resolved: Yes  Denies suicidal/homicidal ideation: Yes  Issues/concerns per patient self-inventory: Yes  Other:   New problem(s) identified: N/A   Discharge Plan or Barriers: CSW continuing to assess, patient new to milieu.   Reason for Continuation of Hospitalization:   Depression   Anxiety   Medication Stabilization   Comments: N/A   Estimated length of stay: 3-5 days    Patient is a year old female admitted for overdosing on BC powders who presented with "I took a lot of pain medicine".  HPI: Patient interviewed. Chart reviewed. Vitals reviewed. This is a 55 year old woman with a history of schizoaffective disorder who comes into the hospital after overdosing on BC powders. She tells me that she took at least 20 of them today. When she first came in she had told some people that it was because of pain in her bladder. She tells me at first that it is because pain in her back but then tells me is because she is hopeless and feels like things will never get better. Rather than simply saying she took a lot of medicine she continues to say that she overdosed. Patient admits that she's been depressed and has had suicidal thoughts. Mood has been down consistently much worse for the last few weeks. Sleep is poor energy level is poor motivation is poor. She feels lonely most of the time. Denies any psychotic symptoms. She says she has been compliant  with her medicine.  Social history: Patient lives by herself in supervised housing. Says that she is pretty lonely most of the time although she does have family.  Medical history: Multiple medical problems including chronic back pain, chronic bladder pain, neurogenic bladder with a need to catheterize herself, diabetes, COPD, hypertension, sleep apnea  Substance abuse history: Denies any alcohol or drug abuse  Past Psychiatric History: Patient has a history of recurrent depressive symptoms. This is at least the third time she has overdosed on over-the-counter medicines. Admits that she stays depressed. She does take psychiatric medicines regularly along with the rest of her medicine. Has had several prior hospital stays.e. Patient lives in . Patient will benefit from crisis stabilization, medication evaluation, group therapy, and psycho education in addition to case management for discharge planning. Patient and CSW reviewed pt's identified goals and treatment plan. Pt verbalized understanding and agreed to treatment plan.    Review of initial/current patient goals per problem list:  1. Goal(s): Patient will participate in aftercare plan   Met: No  Target date: 3-5 days post admission date   As evidenced by: Patient will participate within aftercare plan AEB aftercare provider and housing plan at discharge being identified.   6/14: CSW assessing for appropriate contacts      2. Goal (s): Patient will exhibit decreased depressive symptoms and suicidal ideations.   Met: No  Target date: 3-5 days post admission date   As evidenced by: Patient will utilize self-rating of depression at 3 or below and demonstrate decreased signs of depression  or be deemed stable for discharge by MD.   6/14: Goal progressing.    3. Goal(s): Patient will demonstrate decreased signs and symptoms of anxiety.   Met: No  Target date: 3-5 days post admission date   As evidenced by: Patient will utilize  self-rating of anxiety at 3 or below and demonstrated decreased signs of anxiety, or be deemed stable for discharge by MD   6/14: Goal progressing.    4. Goal(s): Patient will demonstrate decreased signs of withdrawal due to substance abuse   Met: Yes  Target date: 3-5 days post admission date   As evidenced by: Patient will produce a CIWA/COWS score of 0, have stable vitals signs, and no symptoms of withdrawal   6/14: Patient produced a CIWA/COWS score of 0, has stable vitals signs, and no symptoms of withdrawal    Attendees:  Patient: Cristina [name] Family:  Physician: Orlean Bradford, MD     07/31/2015 9:30 AM  Nursing: Polly Cobia, RN     07/31/2015 9:30 AM  Clinical Social Worker: Marylou Flesher, [place]  07/31/2015 9:30 AM  Clinical Social Worker: Glorious Peach, [place] 07/31/2015 9:30 AM  Recreational Therapist: Everitt Amber   07/31/2015 9:30 AM  Other:        07/31/2015 9:30 AM  Other:        07/31/2015 9:30 AM   Alphonse Guild. Malasha Kleppe, LCSWA, LCAS  07/31/15

## 2015-07-31 NOTE — Progress Notes (Signed)
Patient ID: Cristina [name], female   DOB: [birth year], 55 y.o.   MRN: [id number]  CSW attempted to complete a PSA, but the pt stated she would prefer to discuss her discharge plans and asked to complete the PSA on the following day.    Cristina [name], LCSWA, LCAS  07/31/15

## 2015-07-31 NOTE — Progress Notes (Addendum)
Oriented to the unit, CBG on arrival = 147, snack and beverages provided, HOB & EOB adjusted to comfort and to allow venous return.

## 2015-07-31 NOTE — Progress Notes (Signed)
Recreation Therapy Notes  Date: 06.14.17 Time: 9:30 am Location: Craft Room  Group Topic: Self-esteem  Goal Area(s) Addresses:  Patient will identify at least one positive trait about self. Patient will identify at least one healthy coping skill.  Behavioral Response: Did not attend  Intervention: All About Me  Activity: Patients were instructed to make a pamphlet including their life's motto, positive traits, healthy coping skills, and their support system.  Education: LRT educated patients on ways they can increase their self-esteem.  Education Outcome: Patient did not attend group.   Clinical Observations/Feedback: Patient did not attend group.  Leonette Monarch, LRT/CTRS 07/31/2015 1:11 PM

## 2015-07-31 NOTE — BHH Group Notes (Signed)
[facility] Group Notes:  (Nursing/MHT/Case Management/Adjunct)  Date:  07/31/2015  Time:  10:41 PM  Type of Therapy:  Evening Wrap-up Group  Participation Level:  Did Not Attend  Participation Quality:  N/A  Affect:  N/A  Cognitive:  N/A  Insight:  None  Engagement in Group:  N/A  Modes of Intervention:  Discussion  Summary of Progress/Problems:  [name] 07/31/2015, 10:41 PM

## 2015-08-01 LAB — GLUCOSE, CAPILLARY
Glucose-Capillary: 124 mg/dL — ABNORMAL HIGH (ref 65–99)
Glucose-Capillary: 160 mg/dL — ABNORMAL HIGH (ref 65–99)
Glucose-Capillary: 108 mg/dL — ABNORMAL HIGH (ref 65–99)
Glucose-Capillary: 121 mg/dL — ABNORMAL HIGH (ref 65–99)

## 2015-08-01 LAB — LIPID PANEL
Cholesterol: 208 mg/dL — ABNORMAL HIGH (ref 0–200)
HDL: 43 mg/dL (ref >40)
LDL Cholesterol: 113 mg/dL — ABNORMAL HIGH (ref 0–99)
Total CHOL/HDL Ratio: 4.8 RATIO
Triglycerides: 259 mg/dL — ABNORMAL HIGH (ref <150)
VLDL: 52 mg/dL — ABNORMAL HIGH (ref 0–40)

## 2015-08-01 NOTE — BHH Group Notes (Signed)
[facility] LCSW Group Therapy   08/01/2015 9:30 am  Type of Therapy: Group Therapy   Participation Level: Did Not Attend. Patient invited to participate but declined.    Cristina [name]. Cristina [name], MSW, LCSWA, LCAS

## 2015-08-01 NOTE — Progress Notes (Signed)
Pt reports sleeping good last pm with no sleep medication requested. Appetite good, energy level low. Rates depression, hopelessness and anxiety at 0. No prns requested. Med and group compliant. Appropriate with staff and peers. Pt goal today to talk with doctor to go home.  Encouragement and support offered. Pt receptive and remains safe on unit with q 15 min checks.

## 2015-08-01 NOTE — BHH Group Notes (Signed)
[facility] Group Notes:  (Nursing/MHT/Case Management/Adjunct)  Date:  08/01/2015  Time:  4:02 PM  Type of Therapy:  Movement Therapy  Participation Level:  Did Not Attend  Genea Rheaume De'Chelle Ling Flesch 08/01/2015, 4:02 PM

## 2015-08-01 NOTE — Progress Notes (Addendum)
[facility] MD Progress Note  08/01/2015 8:47 AM Cristina [name]  MRN:  [id number] Subjective:  Patient has been withdrawn to her room. She has not been attending in programming. She continues to ask for discharge. She denies having depressed mood, suicidality, homicidality or having auditory or visual hallucinations. States that her chronic pain is not as severe as it was when she was admitted. Denies problems with appetite, sleep and energy or concentration. Denies side effects from medications and denies having any physical complaints.  Per nursing: D: Patient is alert and oriented to person on the unit this shift. Patient did not groups today. Patient denies suicidal ideation, homicidal ideation, auditory or visual hallucinations at the present time.  A: Scheduled medications are administered to patient as per MD orders. Emotional support and encouragement are provided. Patient is maintained on q.15 minute safety checks. Patient is informed to notify staff with questions or concerns. R: No adverse medication reactions are noted. Patient is cooperative with medication administration but slow And cooperative with treatment plan today. Patient is nonreceptive, Anxious but cooperative on the unit at this time. Patient does not interact with others on the unit this shift. Patient contracts for safety at this time. Patient remains safe at this time.  Principal Problem: Schizoaffective disorder, bipolar type ([place]) Diagnosis:   Patient Active Problem List   Diagnosis Date Noted  . COPD (chronic obstructive pulmonary disease) ([place]) [J44.9] 07/31/2015  . Aspirin overdose [T39.011A] 07/30/2015  . Schizoaffective disorder, bipolar type ([place]) [F25.0] 12/05/2014  . HTN (hypertension) [I10] 12/05/2014  . Sleep apnea [G47.30] 12/05/2014  . Diabetes ([place]) [E11.9] 12/05/2014  . Incomplete bladder emptying [R33.9] 12/04/2014  . Recurrent UTI [N39.0] 12/04/2014  . Polyneuropathy ([place]) [G62.9] 03/20/2009  .  Arthritis, degenerative [M19.90] 05/23/2007  . HLD (hyperlipidemia) [E78.5] 05/23/2007   Total Time spent with patient: 30 minutes  Past Psychiatric History:   Past Medical History:  Past Medical History  Diagnosis Date  . Asthma   . CHF (congestive heart failure) ([place])   . Diabetes mellitus without complication ([place])   . History of cholecystectomy     hx of cholecystectomy  . H/O tubal ligation     hx btl  . Hypertension   . Stroke ([place])   . Abdominal hernia   . Fatty tumor   . Sleep apnea   . COPD (chronic obstructive pulmonary disease) ([place])   . H/O colonoscopy   . Bladder dysfunction   . Muscle cramps   . Vaginitis   . Recurrent boils   . Dyspnea   . Edema   . DDD (degenerative disc disease), lumbar   . UTI (lower urinary tract infection)   . Carpal tunnel syndrome   . Neuropathy ([place])   . Obesity   . Arthritis   . Schizoaffective disorder ([place])   . GERD (gastroesophageal reflux disease)   . HLD (hyperlipidemia)     Past Surgical History  Procedure Laterality Date  . Cholecystectomy    . Tubal ligation    . Cardiac catheterization Left 09/24/2014    Procedure: Left Heart Cath and Coronary Angiography;  Surgeon: Dionisio David, MD;  Location: [facility];  Service: Cardiovascular;  Laterality: Left;   Family History:  Family History  Problem Relation Age of Onset  . CAD Mother    Family Psychiatric  History:   Social History:  History  Alcohol Use No     History  Drug Use No    Social History   Social  History  . Marital Status: Single    Spouse Name: N/A  . Number of Children: N/A  . Years of Education: N/A   Social History Main Topics  . Smoking status: Former Research scientist (life sciences)  . Smokeless tobacco: None  . Alcohol Use: No  . Drug Use: No  . Sexual Activity: Not Asked   Other Topics Concern  . None   Social History Narrative     Current Medications: Current Facility-Administered Medications  Medication Dose Route Frequency Provider  Last Rate Last Dose  . acetaminophen (TYLENOL) tablet 650 mg  650 mg Oral Q6H PRN Gonzella Lex, MD      . albuterol (PROVENTIL HFA;VENTOLIN HFA) 108 (90 Base) MCG/ACT inhaler 2 puff  2 puff Inhalation Q4H PRN Gonzella Lex, MD      . alum & mag hydroxide-simeth (MAALOX/MYLANTA) 200-200-20 MG/5ML suspension 30 mL  30 mL Oral Q4H PRN Gonzella Lex, MD      . atorvastatin (LIPITOR) tablet 20 mg  20 mg Oral q1800 Gonzella Lex, MD   20 mg at 07/31/15 1723  . clonazePAM (KLONOPIN) tablet 0.5 mg  0.5 mg Oral BID Gonzella Lex, MD   0.5 mg at 08/01/15 [id number]  . cyclobenzaprine (FLEXERIL) tablet 10 mg  10 mg Oral TID Hildred Priest, MD   10 mg at 08/01/15 [id number]  . diclofenac sodium (VOLTAREN) 1 % transdermal gel 2 g  2 g Topical QID PRN Gonzella Lex, MD      . furosemide (LASIX) tablet 40 mg  40 mg Oral Daily Gonzella Lex, MD   40 mg at 08/01/15 0834  . haloperidol (HALDOL) tablet 5 mg  5 mg Oral QHS Hildred Priest, MD   5 mg at 07/31/15 2207  . insulin detemir (LEVEMIR) injection 50 Units  50 Units Subcutaneous BH-q7a Gonzella Lex, MD   50 Units at 08/01/15 [phone]  . insulin detemir (LEVEMIR) injection 52 Units  52 Units Subcutaneous QHS Gonzella Lex, MD   52 Units at 07/31/15 2226  . lidocaine (LIDODERM) 5 % 1 patch  1 patch Transdermal Q24H Gonzella Lex, MD   1 patch at 07/31/15 2218  . linagliptin (TRADJENTA) tablet 5 mg  5 mg Oral Daily Gonzella Lex, MD   5 mg at 08/01/15 0834  . lisinopril (PRINIVIL,ZESTRIL) tablet 20 mg  20 mg Oral Daily Gonzella Lex, MD   20 mg at 08/01/15 0834  . loratadine (CLARITIN) tablet 10 mg  10 mg Oral Daily Gonzella Lex, MD   10 mg at 08/01/15 0834  . magnesium hydroxide (MILK OF MAGNESIA) suspension 30 mL  30 mL Oral Daily PRN Gonzella Lex, MD      . meloxicam (MOBIC) tablet 7.5 mg  7.5 mg Oral BID Hildred Priest, MD   7.5 mg at 08/01/15 0834  . metoprolol tartrate (LOPRESSOR) tablet 50 mg  50 mg Oral BID Gonzella Lex,  MD   50 mg at 08/01/15 [id number]  . mirtazapine (REMERON) tablet 45 mg  45 mg Oral QHS Gonzella Lex, MD   45 mg at 07/31/15 2220  . mometasone-formoterol (DULERA) 200-5 MCG/ACT inhaler 2 puff  2 puff Inhalation BID Gonzella Lex, MD   2 puff at 08/01/15 0800  . pantoprazole (PROTONIX) EC tablet 40 mg  40 mg Oral Daily Gonzella Lex, MD   40 mg at 08/01/15 0833  . pregabalin (LYRICA) capsule 100 mg  100 mg Oral TID Madie Reno  Clapacs, MD   100 mg at 08/01/15 0833  . tiotropium (SPIRIVA) inhalation capsule 18 mcg  18 mcg Inhalation Daily Gonzella Lex, MD      . traZODone (DESYREL) tablet 100 mg  100 mg Oral QHS Gonzella Lex, MD   100 mg at 07/31/15 2221    Lab Results:  Results for orders placed or performed during the hospital encounter of 07/30/15 (from the past 48 hour(s))  Hemoglobin A1c     Status: Abnormal   Collection Time: 07/30/15  4:00 PM  Result Value Ref Range   Hgb A1c MFr Bld 6.6 (H) 4.0 - 6.0 %  Glucose, capillary     Status: Abnormal   Collection Time: 07/30/15 11:46 PM  Result Value Ref Range   Glucose-Capillary 147 (H) 65 - 99 mg/dL  Blood gas, venous     Status: Abnormal   Collection Time: 07/31/15 12:49 AM  Result Value Ref Range   pH, Ven 7.33 7.320 - 7.430   pCO2, Ven 62 (H) 44.0 - 60.0 mmHg   pO2, Ven <31.0 (L) 31.0 - 45.0 mmHg   Bicarbonate 32.7 (H) 21.0 - 28.0 mEq/L   Acid-Base Excess 5.3 (H) 0.0 - 3.0 mmol/L   Patient temperature 37.0    Collection site VEIN    Sample type VENOUS   Glucose, capillary     Status: Abnormal   Collection Time: 07/31/15  6:41 AM  Result Value Ref Range   Glucose-Capillary 187 (H) 65 - 99 mg/dL   Comment 1 Notify RN   Glucose, capillary     Status: Abnormal   Collection Time: 07/31/15  4:25 PM  Result Value Ref Range   Glucose-Capillary 118 (H) 65 - 99 mg/dL   Comment 1 Notify RN   Glucose, capillary     Status: Abnormal   Collection Time: 07/31/15  8:59 PM  Result Value Ref Range   Glucose-Capillary 130 (H) 65 - 99 mg/dL   Glucose, capillary     Status: Abnormal   Collection Time: 07/31/15 10:26 PM  Result Value Ref Range   Glucose-Capillary 120 (H) 65 - 99 mg/dL  Glucose, capillary     Status: Abnormal   Collection Time: 08/01/15  6:31 AM  Result Value Ref Range   Glucose-Capillary 124 (H) 65 - 99 mg/dL  Lipid panel     Status: Abnormal   Collection Time: 08/01/15  6:45 AM  Result Value Ref Range   Cholesterol 208 (H) 0 - 200 mg/dL   Triglycerides 259 (H) <150 mg/dL   HDL 43 >40 mg/dL   Total CHOL/HDL Ratio 4.8 RATIO   VLDL 52 (H) 0 - 40 mg/dL   LDL Cholesterol 113 (H) 0 - 99 mg/dL    Comment:        Total Cholesterol/HDL:CHD Risk Coronary Heart Disease Risk Table                     Men   Women  1/2 Average Risk   3.4   3.3  Average Risk       5.0   4.4  2 X Average Risk   9.6   7.1  3 X Average Risk  23.4   11.0        Use the calculated Patient Ratio above and the CHD Risk Table to determine the patient's CHD Risk.        ATP III CLASSIFICATION (LDL):  <100     mg/dL   Optimal  100-129  mg/dL   Near or Above                    Optimal  130-159  mg/dL   Borderline  160-189  mg/dL   High  >190     mg/dL   Very High     Blood Alcohol level:  Lab Results  Component Value Date   ETH <5 07/30/2015   ETH 19* 12/04/2014    Physical Findings: AIMS: Facial and Oral Movements Muscles of Facial Expression: None, normal Lips and Perioral Area: None, normal Jaw: None, normal Tongue: None, normal,Extremity Movements Upper (arms, wrists, hands, fingers): None, normal Lower (legs, knees, ankles, toes): None, normal, Trunk Movements Neck, shoulders, hips: None, normal, Overall Severity Severity of abnormal movements (highest score from questions above): None, normal Incapacitation due to abnormal movements: None, normal Patient's awareness of abnormal movements (rate only patient's report): No Awareness, Dental Status Current problems with teeth and/or dentures?: No Does patient usually  wear dentures?: Yes  CIWA:    COWS:     Musculoskeletal: Strength & Muscle Tone: within normal limits Gait & Station: normal Patient leans: N/A  Psychiatric Specialty Exam: Physical Exam  Constitutional: She is oriented to person, place, and time. She appears well-developed and well-nourished.  HENT:  Head: Normocephalic and atraumatic.  Eyes: EOM are normal.  Neck: Normal range of motion.  Respiratory: Effort normal.  Musculoskeletal: Normal range of motion.  Neurological: She is alert and oriented to person, place, and time.    Review of Systems  Constitutional: Negative.   HENT: Negative.   Eyes: Negative.   Respiratory: Negative.   Cardiovascular: Negative.   Gastrointestinal: Negative.   Genitourinary: Negative.   Musculoskeletal: Negative.   Skin: Negative.   Neurological: Negative.   Endo/Heme/Allergies: Negative.   Psychiatric/Behavioral: Negative.     Blood pressure 111/52, pulse 90, temperature 98.9 F (37.2 C), temperature source Oral, resp. rate 20, height 4\' 9"  (1.448 m), weight 110.224 kg (243 lb), SpO2 93 %.Body mass index is 52.57 kg/(m^2).  General Appearance: Fairly Groomed  Eye Contact:  Good  Speech:  Clear and Coherent  Volume:  Normal  Mood:  Dysphoric  Affect:  Congruent  Thought Process:  Linear and Descriptions of Associations: Intact  Orientation:  Full (Time, Place, and Person)  Thought Content:  Hallucinations: None  Suicidal Thoughts:  No  Homicidal Thoughts:  No  Memory:  Immediate;   Fair Recent;   Fair Remote;   Fair  Judgement:  Poor  Insight:  Shallow  Psychomotor Activity:  Normal  Concentration:  Concentration: Fair and Attention Span: Fair  Recall:  [employer] of Knowledge:  Fair  Language:  Fair  Akathisia:  No  Handed:    AIMS (if indicated):     Assets:  Armed forces logistics/support/administrative officer Social Support  ADL's:  Intact  Cognition:  WNL  Sleep:  Number of Hours: 6.15     Treatment Plan Summary:  Schizoaffective disorder  patient states she has been receiving Haldol Decanoate 50 mg IM every 30 days. She is stated that she feels more depressed and more anxious 2 weeks prior to receiving her injection. She reports that in the past she did better when she was receiving no 100 mg. I we will start Haldol oral 5 mg by mouth daily at bedtime and now will recommend to continue Haldol Decanoate 100 mg upon discharge. For depressive symptoms the patient will be continued on mirtazapine 45 mg a day.  Insomnia continue  trazodone 100 mg by mouth daily at bedtime  Continue clonazepam 0.5 mg by mouth twice a day  Dyslipidemia continue Lipitor 20 mg q day  COPD continue inhalers albuterol, dulera and spiriva  Back pain continue Flexeril which I will increase to 10 mg by mouth 3 times a day, continue Voltaren gel, Lidoderm patches and Mobic which I will increase to 7.5 mg twice a day  Diabetic neuropathy continue Lyrica 100 mg tid  GERD continue Protonix  Hypertension continue Lopressor, lisinopril. PT and heart rate are well controlled  Edema continue Lasix 40 mg  Diabetes continue Levemir 50 U q am and 52 U qhs, metformin and tradjenta. Hemoglobin A1c was 6.6.  Precautions every 15 minute checks,   Diet diet low sodium and carb modified  Disposition once stabilized the patient will be discharged back to her apartment  Follow-up she will continue to follow up with [name] act team  Social worker will contact the patient's daughter for collateral information. Most likely this patient will be discharge in the next 24-48 hours.  Hildred Priest, MD 08/01/2015, 8:47 AM

## 2015-08-01 NOTE — Plan of Care (Signed)
Problem: Coping: Goal: Ability to verbalize feelings will improve Outcome: Progressing Pt verbalizes feelings to staff and concerns

## 2015-08-01 NOTE — Progress Notes (Signed)
D: Patient is alert and oriented to person on the unit this shift. Patient did not   groups today. Patient denies suicidal ideation, homicidal ideation, auditory or visual hallucinations at the present time.  A: Scheduled medications are administered to patient as per MD orders. Emotional support and encouragement are provided. Patient is maintained on q.15 minute safety checks. Patient is informed to notify staff with questions or concerns. R: No adverse medication reactions are noted. Patient is cooperative with medication administration but slow  And cooperative with treatment plan today. Patient is nonreceptive,  Anxious but cooperative on the unit at this time. Patient does not interact with others on the unit this shift. Patient contracts for safety at this time. Patient remains safe at this time.
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
## 2015-08-01 NOTE — [facility] Group Notes (Signed)
[facility] Group Notes:  (Nursing/MHT/Case Management/Adjunct)  Date:  08/01/2015  Time:  10:47 PM  Type of Therapy:  Group Therapy  Participation Level:  Minimal  Participation Quality:  Inattentive  Affect:  Flat  Cognitive:  Appropriate  Insight:  Appropriate  Engagement in Group:  Limited  Modes of Intervention:  Discussion  Summary of Progress/Problems:  Cristina [name] Cristina [name] 08/01/2015, 10:47 PM

## 2015-08-01 NOTE — Progress Notes (Signed)
Recreation Therapy Notes  Date: 06.15.17 Time: 9:30 am Location: Craft Room  Group Topic: Leisure Education  Goal Area(s) Addresses:  Patient will identify activities for each letter of the alphabet. Patient will verbalize ability to integrate positive leisure into life post d/c. Patient will verbalize ability to use leisure as a Technical sales engineer.  Behavioral Response: Did not attend  Intervention: Leisure Alphabet  Activity: Patients were given a Leisure Air traffic controller and instructed to identify leisure activities for each letter of the alphabet.  Education: LRT educated patients on what they need to participate in leisure.  Education Outcome: Patient did not attend group.   Clinical Observations/Feedback: Patient did not attend group.  Leonette Monarch, LRT/CTRS 08/01/2015 10:23 AM

## 2015-08-01 NOTE — Plan of Care (Signed)
Problem: Coping: Goal: Ability to cope will improve Outcome: Not Progressing Patient not able to cope at this time as she has not learned coping mechanisms as of yet this shift .She has crying spells frequently Museum/gallery curator

## 2015-08-02 LAB — BLOOD GAS, VENOUS
Acid-Base Excess: 10.3 mmol/L — ABNORMAL HIGH (ref 0.0–3.0)
Bicarbonate: 36.3 mEq/L — ABNORMAL HIGH (ref 21.0–28.0)
Patient temperature: 37
pCO2, Ven: 56 mmHg (ref 44.0–60.0)
pH, Ven: 7.42 (ref 7.320–7.430)
pO2, Ven: <31 mmHg — ABNORMAL LOW (ref 31.0–45.0)

## 2015-08-02 LAB — GLUCOSE, CAPILLARY
Glucose-Capillary: 157 mg/dL — ABNORMAL HIGH (ref 65–99)
Glucose-Capillary: 194 mg/dL — ABNORMAL HIGH (ref 65–99)
Glucose-Capillary: 124 mg/dL — ABNORMAL HIGH (ref 65–99)
Glucose-Capillary: 149 mg/dL — ABNORMAL HIGH (ref 65–99)

## 2015-08-02 LAB — PROLACTIN: Prolactin: 29.6 ng/mL — ABNORMAL HIGH (ref 4.8–23.3)

## 2015-08-02 MED ORDER — FUROSEMIDE 40 MG PO TABS: 40.0000 mg | ORAL_TABLET | Freq: Every day | ORAL | Status: DC

## 2015-08-02 MED ORDER — CYCLOBENZAPRINE HCL 10 MG PO TABS: 10.0000 mg | ORAL_TABLET | Freq: Three times a day (TID) | ORAL | Status: DC

## 2015-08-02 MED ORDER — MELOXICAM 7.5 MG PO TABS: 7.5000 mg | ORAL_TABLET | Freq: Two times a day (BID) | ORAL | Status: DC

## 2015-08-02 MED ORDER — HALOPERIDOL DECANOATE 100 MG/ML IM SOLN: 100.0000 mg | INTRAMUSCULAR | Status: AC

## 2015-08-02 MED ORDER — HALOPERIDOL 5 MG PO TABS: 5.0000 mg | ORAL_TABLET | Freq: Every day | ORAL | Status: DC
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
## 2015-08-02 NOTE — [facility] Group Notes (Signed)
[facility] LCSW Group Therapy  08/02/2015 5:12 PM  Type of Therapy:  Group Therapy  Participation Level:  Did Not Attend  Modes of Intervention:  Discussion, Education, Socialization and Support  Summary of Progress/Problems:Feelings around Relapse. Group members discussed the meaning of relapse and shared personal stories of relapse, how it affected them and others, and how they perceived themselves during this time. Group members were encouraged to identify triggers, warning signs and coping skills used when facing the possibility of relapse. Social supports were discussed and explored in detail.   [place] MSW, LCSWA  08/02/2015, 5:12 PM

## 2015-08-02 NOTE — Plan of Care (Signed)
Problem: Activity: Goal: Imbalance in normal sleep/wake cycle will improve Outcome: Progressing Slept 6.15 hours previous night.

## 2015-08-02 NOTE — Plan of Care (Signed)
Problem: Activity: Goal: Imbalance in normal sleep/wake cycle will improve Outcome: Progressing Goal : patient to avoid naps during the day, and Patient to participate in groups and activities, Patient did go to one group today.            Patient has sit up in chair in room and encouraged not to lay back down during the day.

## 2015-08-02 NOTE — Progress Notes (Signed)
D: Patient is alert and oriented on the unit this shift. Patient attended   Group  today. Patient denies suicidal ideation, homicidal ideation, auditory or visual hallucinations at the present time.  A: Scheduled medications are administered to patient as per MD orders. Emotional support and encouragement are provided. Patient is maintained on q.15 minute safety checks. Patient is informed to notify staff with questions or concerns. R: No adverse medication reactions are noted. Patient is cooperative with medication administration and treatment plan today. Patient is receptive, calm and cooperative on the unit at this time looking forward to going home. Patient does not  Interact   others on the unit this shift.somewhat isolative at this time.  Patient contracts for safety at this time. Patient remains safe at this time.

## 2015-08-02 NOTE — [facility] Suicide Risk Assessment (Signed)
[place] INPATIENT:  Family/Significant Other Suicide Prevention Education  Suicide Prevention Education:  Education Completed; with the pt's daughter at ph: [phone],  (name of family member/significant other) has been identified by the patient as the family member/significant other with whom the patient will be residing, and identified as the person(s) who will aid the patient in the event of a mental health crisis (suicidal ideations/suicide attempt).  With written consent from the patient, the family member/significant other has been provided the following suicide prevention education, prior to the and/or following the discharge of the patient.  The suicide prevention education provided includes the following:  Suicide risk factors  Suicide prevention and interventions  National Suicide Hotline telephone number  [facility] assessment telephone number  [facility] Emergency Assistance [place] and/or Residential Mobile Crisis Unit telephone number  Request made of family/significant other to:  Remove weapons (e.g., guns, rifles, knives), all items previously/currently identified as safety concern.    Remove drugs/medications (over-the-counter, prescriptions, illicit drugs), all items previously/currently identified as a safety concern.  The family member/significant other verbalizes understanding of the suicide prevention education information provided.  The family member/significant other agrees to remove the items of safety concern listed above.  Cristina [name] 08/02/2015, 4:39 PM

## 2015-08-02 NOTE — BHH Suicide Risk Assessment (Addendum)
[facility] Discharge Suicide Risk Assessment   Principal Problem: Schizoaffective disorder, bipolar type [facility]) Discharge Diagnoses:  Patient Active Problem List   Diagnosis Date Noted  . COPD (chronic obstructive pulmonary disease) ([place]) [J44.9] 07/31/2015  . Aspirin overdose [T39.011A] 07/30/2015  . Schizoaffective disorder, bipolar type ([place]) [F25.0] 12/05/2014  . HTN (hypertension) [I10] 12/05/2014  . Sleep apnea [G47.30] 12/05/2014  . Diabetes ([place]) [E11.9] 12/05/2014  . Incomplete bladder emptying [R33.9] 12/04/2014  . Recurrent UTI [N39.0] 12/04/2014  . Polyneuropathy ([place]) [G62.9] 03/20/2009  . Arthritis, degenerative [M19.90] 05/23/2007  . HLD (hyperlipidemia) [E78.5] 05/23/2007      Psychiatric Specialty Exam: ROS  Blood pressure 102/73, pulse 80, temperature 98.3 F (36.8 C), temperature source Oral, resp. rate 20, height 4\' 9"  (1.448 m), weight 110.224 kg (243 lb), SpO2 100 %.Body mass index is 52.57 kg/(m^2).                                                       Mental Status Per Nursing Assessment::   On Admission:  Suicidal ideation indicated by patient, Suicide plan  Demographic Factors:  Caucasian and Living alone  Loss Factors: Decline in physical health  Historical Factors: Prior suicide attempts and Impulsivity  Risk Reduction Factors:   Positive social support  Continued Clinical Symptoms:  Previous Psychiatric Diagnoses and Treatments Medical Diagnoses and Treatments/Surgeries  Cognitive Features That Contribute To Risk:  Closed-mindedness    Suicide Risk:  Minimal: No identifiable suicidal ideation.  Patients presenting with no risk factors but with morbid ruminations; may be classified as minimal risk based on the severity of the depressive symptoms  Follow-up Information    Call [facility] .   Why:  We have faxed your information, with your permission to your home health care provider so they ca  assess you for weekend homehealthcare, in addtion to your current weekday care. Please call to confirm.   Contact information:   [facility]  [address] [place], [place] [address] Ph: [phone] Fax: [phone]      Follow up with [name].   Why:  Thayer Headings and dr. Loni Muse from [facility] will visit you in your home at 1pm on Wednesday June 21st, 2017 for your hospital follow up for medication managment and therapy   Contact information:   [facility] Team [address] [place], [place] [address] Ph: [phone] Fax: [phone]       Hildred Priest, MD 08/03/2015, 6:27 AM

## 2015-08-02 NOTE — Progress Notes (Signed)
D: Patient appears flat. She denies SI/HI/AVH at this states. States she hasn't had any AVH in many years. States pain at a 10 in her back. She has been visible in the milieu and attended group.  A: Medication given with education. Encouragement provided.  D: She has been compliant with medication. She has remained calm and cooperative. Safety maintained with 15 min checks.

## 2015-08-02 NOTE — NC FL2 (Signed)
[place] LEVEL OF CARE SCREENING TOOL     IDENTIFICATION  Patient Name: Cristina [name] Birthdate: 06/14/1960 Sex: female Admission Date (Current Location): 07/30/2015  [place] and [place] Number:  Selena Lesser ED:[id number] [place] and Address:  [facility], [address], [place], [place] [address]      Provider Number: [id number]  Attending Physician Name and Address:  Golden Hurter*  Relative Name and Phone Number:   Sharen Hones [phone]    Current Level of Care: Hospital Recommended Level of Care: Other (Comment) (Group Home) Prior Approval Number:    Date Approved/Denied:   PASRR Number:    Discharge Plan: Other (Comment) ([place])    Current Diagnoses: Patient Active Problem List   Diagnosis Date Noted  . COPD (chronic obstructive pulmonary disease) ([place]) 07/31/2015  . Aspirin overdose 07/30/2015  . Schizoaffective disorder, bipolar type ([place]) 12/05/2014  . HTN (hypertension) 12/05/2014  . Sleep apnea 12/05/2014  . Diabetes ([place]) 12/05/2014  . Incomplete bladder emptying 12/04/2014  . Recurrent UTI 12/04/2014  . Polyneuropathy ([place]) 03/20/2009  . Arthritis, degenerative 05/23/2007  . HLD (hyperlipidemia) 05/23/2007    Orientation RESPIRATION BLADDER Height & Weight     Self, Time, Situation, Place  Normal Continent Weight: 243 lb (110.224 kg) Height:  4\' 9"  (144.8 cm)  BEHAVIORAL SYMPTOMS/MOOD NEUROLOGICAL BOWEL NUTRITION STATUS      Continent    AMBULATORY STATUS COMMUNICATION OF NEEDS Skin   Supervision Verbally Normal                       Personal Care Assistance Level of Assistance              Functional Limitations Info             SPECIAL CARE FACTORS FREQUENCY                       Contractures Contractures Info: Not present    Additional Factors Info                  Current Medications (08/02/2015):  This is the current hospital active medication  list Current Facility-Administered Medications  Medication Dose Route Frequency Provider Last Rate Last Dose  . acetaminophen (TYLENOL) tablet 650 mg  650 mg Oral Q6H PRN Gonzella Lex, MD      . albuterol (PROVENTIL HFA;VENTOLIN HFA) 108 (90 Base) MCG/ACT inhaler 2 puff  2 puff Inhalation Q4H PRN Gonzella Lex, MD      . alum & mag hydroxide-simeth (MAALOX/MYLANTA) 200-200-20 MG/5ML suspension 30 mL  30 mL Oral Q4H PRN Gonzella Lex, MD      . atorvastatin (LIPITOR) tablet 20 mg  20 mg Oral q1800 Gonzella Lex, MD   20 mg at 08/01/15 1653  . clonazePAM (KLONOPIN) tablet 0.5 mg  0.5 mg Oral BID Gonzella Lex, MD   0.5 mg at 08/02/15 0944  . cyclobenzaprine (FLEXERIL) tablet 10 mg  10 mg Oral TID Hildred Priest, MD   10 mg at 08/02/15 0945  . diclofenac sodium (VOLTAREN) 1 % transdermal gel 2 g  2 g Topical QID PRN Gonzella Lex, MD      . furosemide (LASIX) tablet 40 mg  40 mg Oral Daily Gonzella Lex, MD   40 mg at 08/02/15 0944  . haloperidol (HALDOL) tablet 5 mg  5 mg Oral QHS Hildred Priest, MD   5 mg at 08/01/15 2157  .  insulin detemir (LEVEMIR) injection 50 Units  50 Units Subcutaneous BH-q7a Gonzella Lex, MD   50 Units at 08/02/15 [phone]  . insulin detemir (LEVEMIR) injection 52 Units  52 Units Subcutaneous QHS Gonzella Lex, MD   52 Units at 08/01/15 2204  . lidocaine (LIDODERM) 5 % 1 patch  1 patch Transdermal Q24H Gonzella Lex, MD   1 patch at 08/01/15 2217  . linagliptin (TRADJENTA) tablet 5 mg  5 mg Oral Daily Gonzella Lex, MD   5 mg at 08/02/15 0944  . lisinopril (PRINIVIL,ZESTRIL) tablet 20 mg  20 mg Oral Daily Gonzella Lex, MD   20 mg at 08/02/15 0943  . loratadine (CLARITIN) tablet 10 mg  10 mg Oral Daily Gonzella Lex, MD   10 mg at 08/02/15 0943  . magnesium hydroxide (MILK OF MAGNESIA) suspension 30 mL  30 mL Oral Daily PRN Gonzella Lex, MD      . meloxicam (MOBIC) tablet 7.5 mg  7.5 mg Oral BID Hildred Priest, MD   7.5 mg at  08/02/15 0944  . metoprolol tartrate (LOPRESSOR) tablet 50 mg  50 mg Oral BID Gonzella Lex, MD   50 mg at 08/02/15 0945  . mirtazapine (REMERON) tablet 45 mg  45 mg Oral QHS Gonzella Lex, MD   45 mg at 08/01/15 2157  . mometasone-formoterol (DULERA) 200-5 MCG/ACT inhaler 2 puff  2 puff Inhalation BID Gonzella Lex, MD   2 puff at 08/02/15 [phone]  . pantoprazole (PROTONIX) EC tablet 40 mg  40 mg Oral Daily Gonzella Lex, MD   40 mg at 08/02/15 0944  . pregabalin (LYRICA) capsule 100 mg  100 mg Oral TID Gonzella Lex, MD   100 mg at 08/02/15 0945  . tiotropium (SPIRIVA) inhalation capsule 18 mcg  18 mcg Inhalation Daily Gonzella Lex, MD   18 mcg at 08/02/15 0834  . traZODone (DESYREL) tablet 100 mg  100 mg Oral QHS Gonzella Lex, MD   100 mg at 08/01/15 2157     Discharge Medications: Please see discharge summary for a list of discharge medications.  Relevant Imaging Results:  Relevant Lab Results:   Additional Information    Alphonse Guild Djon Tith, LCSW

## 2015-08-02 NOTE — Progress Notes (Signed)
Patient ID: Cristina [name], female   DOB: [birth year], 55 y.o.   MRN: [id number] CSw and the pt's CAP/DSS worker met with the pt and the pt stated she wants to live in a group home and/or get home health on the weekends.  Pt later reported top the CSW that she no longer wants a group home and desires to just return home with extended hours, i.e weekend home health care, three hours per day on Saturday and Sunday.  CSW was informed by Dr. Jerilee Hoh that the pt's medical doctor has to recommend extended home health hours.    Alphonse Guild. Pebble Botkin, LCSWA, LCAS  08/02/15

## 2015-08-02 NOTE — Progress Notes (Signed)
Recreation Therapy Notes  Date: 06.16.17 Time: 9:30 am Location: Craft Room  Group Topic: Coping Skills  Goal Area(s) Addresses:  Patient will participate in healthy coping skill. Patient will verbalize using art as a coping skill.  Behavioral Response: Did not attend  Intervention: Coloring  Activity: Patients were given coloring sheets to color and were instructed to think about what their mind was focus on and what emotions they felt.  Education: LRT educated patients on healthy coping skills.  Education Outcome: Patient did not attend group.  Clinical Observations/Feedback: Patient did not attend group.  Leonette Monarch, LRT/CTRS 08/02/2015 10:25 AM

## 2015-08-02 NOTE — Progress Notes (Signed)
Patient alert and oriented, denies Si/HI and avh, Patient ambulates with walker, she is talkative, friendly and pleasant affect, she states she is going home soon and her daughter is going to pick her up. Patient ambulates with walker, complains of back and leg pain, and has prn meds ordered for discomfort. Patient with good appetite, Patient talked about how she had rickets has a child and she states " I'm too short to be a adult, nurse talked with her about self esteem and value, and self motivation. Patient has insight into her behavior and attitude and states she hopes to keep improving.

## 2015-08-02 NOTE — Plan of Care (Signed)
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

## 2015-08-02 NOTE — Progress Notes (Addendum)
  [facility] Adult Case Management Discharge Plan :  Will you be returning to the same living situation after discharge:  Yes,  pt will discharge to her home in [place] At discharge, do you have transportation home?: Yes,  pt will be picked up by her daughter Do you have the ability to pay for your medications: Yes,  pt will be provided with prescriptions at discharge  Release of information consent forms completed and in the chart;  Patient's signature needed at discharge.  Patient to Follow up at: Follow-up Information    Call [facility] .   Why:  We have faxed your information, with your permission to your home health care provider so they ca assess you for weekend homehealthcare, in addtion to your current weekday care. Please call to confirm.   Contact information:   [facility]  [address] [place], [place] [address] Ph: [phone] Fax: [phone]      Follow up with [name].   Why:  Thayer Headings and dr. Loni Muse from [place] will visit you in your home at 1pm on Wednesday June 21st, 2017 for your hospital follow up for medication managment and therapy   Contact information:   [place] Team [address] [place], [name] [address] Ph: [phone] Fax: [phone]      Next level of care provider has access to [place] and Suicide Prevention discussed: Yes,  completed with pt  Have you used any form of tobacco in the last 30 days? (Cigarettes, Smokeless Tobacco, Cigars, and/or Pipes): No  Has patient been referred to the Quitline?: N/A patient is not a smoker  Patient has been referred for addiction treatment: N/A  Claudine Mouton 08/02/2015, 3:59 PM

## 2015-08-02 NOTE — BHH Group Notes (Signed)
[facility] Group Notes:  (Nursing/MHT/Case Management/Adjunct)  Date:  08/02/2015  Time:  4:56 PM  Type of Therapy:  Psychoeducational Skills  Participation Level:  Did Not Attend  Charise Killian 08/02/2015, 4:56 PM

## 2015-08-02 NOTE — Progress Notes (Signed)
[facility] MD Progress Note  08/02/2015 1:31 PM Cristina [name]  MRN:  [id number] Subjective:  Patient has been withdrawn to her room. She has not been attending in programming. She continues to ask for discharge. She denies having depressed mood, suicidality, homicidality or having auditory or visual hallucinations. States that  Chronic back  pain is been well controlled.  Denies problems with appetite, sleep and energy or concentration. Denies side effects from medications and denies having any physical complaints.  Per nursing: D: Patient appears flat. She denies SI/HI/AVH at this states. States she hasn't had any AVH in many years. States pain at a 10 in her back. She has been visible in the milieu and attended group.  A: Medication given with education. Encouragement provided.  D: She has been compliant with medication. She has remained calm and cooperative. Safety maintained with 15 min checks.    Principal Problem: Schizoaffective disorder, bipolar type ([place]) Diagnosis:   Patient Active Problem List   Diagnosis Date Noted  . COPD (chronic obstructive pulmonary disease) ([place]) [J44.9] 07/31/2015  . Aspirin overdose [T39.011A] 07/30/2015  . Schizoaffective disorder, bipolar type ([place]) [F25.0] 12/05/2014  . HTN (hypertension) [I10] 12/05/2014  . Sleep apnea [G47.30] 12/05/2014  . Diabetes ([place]) [E11.9] 12/05/2014  . Incomplete bladder emptying [R33.9] 12/04/2014  . Recurrent UTI [N39.0] 12/04/2014  . Polyneuropathy ([place]) [G62.9] 03/20/2009  . Arthritis, degenerative [M19.90] 05/23/2007  . HLD (hyperlipidemia) [E78.5] 05/23/2007   Total Time spent with patient: 30 minutes  Past Psychiatric History:   Past Medical History:  Past Medical History  Diagnosis Date  . Asthma   . CHF (congestive heart failure) ([place])   . Diabetes mellitus without complication ([place])   . History of cholecystectomy     hx of cholecystectomy  . H/O tubal ligation     hx btl  . Hypertension   . Stroke ([place])    . Abdominal hernia   . Fatty tumor   . Sleep apnea   . COPD (chronic obstructive pulmonary disease) ([place])   . H/O colonoscopy   . Bladder dysfunction   . Muscle cramps   . Vaginitis   . Recurrent boils   . Dyspnea   . Edema   . DDD (degenerative disc disease), lumbar   . UTI (lower urinary tract infection)   . Carpal tunnel syndrome   . Neuropathy ([place])   . Obesity   . Arthritis   . Schizoaffective disorder ([place])   . GERD (gastroesophageal reflux disease)   . HLD (hyperlipidemia)     Past Surgical History  Procedure Laterality Date  . Cholecystectomy    . Tubal ligation    . Cardiac catheterization Left 09/24/2014    Procedure: Left Heart Cath and Coronary Angiography;  Surgeon: Dionisio David, MD;  Location: [facility];  Service: Cardiovascular;  Laterality: Left;   Family History:  Family History  Problem Relation Age of Onset  . CAD Mother    Family Psychiatric  History:   Social History:  History  Alcohol Use No     History  Drug Use No    Social History   Social History  . Marital Status: Single    Spouse Name: N/A  . Number of Children: N/A  . Years of Education: N/A   Social History Main Topics  . Smoking status: Former Research scientist (life sciences)  . Smokeless tobacco: None  . Alcohol Use: No  . Drug Use: No  . Sexual Activity: Not Asked   Other Topics Concern  .  None   Social History Narrative     Current Medications: Current Facility-Administered Medications  Medication Dose Route Frequency Provider Last Rate Last Dose  . acetaminophen (TYLENOL) tablet 650 mg  650 mg Oral Q6H PRN Gonzella Lex, MD      . albuterol (PROVENTIL HFA;VENTOLIN HFA) 108 (90 Base) MCG/ACT inhaler 2 puff  2 puff Inhalation Q4H PRN Gonzella Lex, MD      . alum & mag hydroxide-simeth (MAALOX/MYLANTA) 200-200-20 MG/5ML suspension 30 mL  30 mL Oral Q4H PRN Gonzella Lex, MD      . atorvastatin (LIPITOR) tablet 20 mg  20 mg Oral q1800 Gonzella Lex, MD   20 mg at 08/01/15  1653  . clonazePAM (KLONOPIN) tablet 0.5 mg  0.5 mg Oral BID Gonzella Lex, MD   0.5 mg at 08/02/15 0944  . cyclobenzaprine (FLEXERIL) tablet 10 mg  10 mg Oral TID Hildred Priest, MD   10 mg at 08/02/15 0945  . diclofenac sodium (VOLTAREN) 1 % transdermal gel 2 g  2 g Topical QID PRN Gonzella Lex, MD      . furosemide (LASIX) tablet 40 mg  40 mg Oral Daily Gonzella Lex, MD   40 mg at 08/02/15 0944  . haloperidol (HALDOL) tablet 5 mg  5 mg Oral QHS Hildred Priest, MD   5 mg at 08/01/15 2157  . insulin detemir (LEVEMIR) injection 50 Units  50 Units Subcutaneous BH-q7a Gonzella Lex, MD   50 Units at 08/02/15 [phone]  . insulin detemir (LEVEMIR) injection 52 Units  52 Units Subcutaneous QHS Gonzella Lex, MD   52 Units at 08/01/15 2204  . lidocaine (LIDODERM) 5 % 1 patch  1 patch Transdermal Q24H Gonzella Lex, MD   1 patch at 08/01/15 2217  . linagliptin (TRADJENTA) tablet 5 mg  5 mg Oral Daily Gonzella Lex, MD   5 mg at 08/02/15 0944  . lisinopril (PRINIVIL,ZESTRIL) tablet 20 mg  20 mg Oral Daily Gonzella Lex, MD   20 mg at 08/02/15 0943  . loratadine (CLARITIN) tablet 10 mg  10 mg Oral Daily Gonzella Lex, MD   10 mg at 08/02/15 0943  . magnesium hydroxide (MILK OF MAGNESIA) suspension 30 mL  30 mL Oral Daily PRN Gonzella Lex, MD      . meloxicam (MOBIC) tablet 7.5 mg  7.5 mg Oral BID Hildred Priest, MD   7.5 mg at 08/02/15 0944  . metoprolol tartrate (LOPRESSOR) tablet 50 mg  50 mg Oral BID Gonzella Lex, MD   50 mg at 08/02/15 0945  . mirtazapine (REMERON) tablet 45 mg  45 mg Oral QHS Gonzella Lex, MD   45 mg at 08/01/15 2157  . mometasone-formoterol (DULERA) 200-5 MCG/ACT inhaler 2 puff  2 puff Inhalation BID Gonzella Lex, MD   2 puff at 08/02/15 [phone]  . pantoprazole (PROTONIX) EC tablet 40 mg  40 mg Oral Daily Gonzella Lex, MD   40 mg at 08/02/15 0944  . pregabalin (LYRICA) capsule 100 mg  100 mg Oral TID Gonzella Lex, MD   100 mg at  08/02/15 0945  . tiotropium (SPIRIVA) inhalation capsule 18 mcg  18 mcg Inhalation Daily Gonzella Lex, MD   18 mcg at 08/02/15 0834  . traZODone (DESYREL) tablet 100 mg  100 mg Oral QHS Gonzella Lex, MD   100 mg at 08/01/15 2157    Lab Results:  Results for  orders placed or performed during the hospital encounter of 07/30/15 (from the past 48 hour(s))  Glucose, capillary     Status: Abnormal   Collection Time: 07/31/15  4:25 PM  Result Value Ref Range   Glucose-Capillary 118 (H) 65 - 99 mg/dL   Comment 1 Notify RN   Glucose, capillary     Status: Abnormal   Collection Time: 07/31/15  8:59 PM  Result Value Ref Range   Glucose-Capillary 130 (H) 65 - 99 mg/dL  Glucose, capillary     Status: Abnormal   Collection Time: 07/31/15 10:26 PM  Result Value Ref Range   Glucose-Capillary 120 (H) 65 - 99 mg/dL  Glucose, capillary     Status: Abnormal   Collection Time: 08/01/15  6:31 AM  Result Value Ref Range   Glucose-Capillary 124 (H) 65 - 99 mg/dL  Lipid panel     Status: Abnormal   Collection Time: 08/01/15  6:45 AM  Result Value Ref Range   Cholesterol 208 (H) 0 - 200 mg/dL   Triglycerides 259 (H) <150 mg/dL   HDL 43 >40 mg/dL   Total CHOL/HDL Ratio 4.8 RATIO   VLDL 52 (H) 0 - 40 mg/dL   LDL Cholesterol 113 (H) 0 - 99 mg/dL    Comment:        Total Cholesterol/HDL:CHD Risk Coronary Heart Disease Risk Table                     Men   Women  1/2 Average Risk   3.4   3.3  Average Risk       5.0   4.4  2 X Average Risk   9.6   7.1  3 X Average Risk  23.4   11.0        Use the calculated Patient Ratio above and the CHD Risk Table to determine the patient's CHD Risk.        ATP III CLASSIFICATION (LDL):  <100     mg/dL   Optimal  100-129  mg/dL   Near or Above                    Optimal  130-159  mg/dL   Borderline  160-189  mg/dL   High  >190     mg/dL   Very High   Prolactin     Status: Abnormal   Collection Time: 08/01/15  6:45 AM  Result Value Ref Range   Prolactin  29.6 (H) 4.8 - 23.3 ng/mL    Comment: (NOTE) Performed At: [facility] [place], [place] [id number] Lindon Romp MD [id number]   Glucose, capillary     Status: Abnormal   Collection Time: 08/01/15 11:40 AM  Result Value Ref Range   Glucose-Capillary 160 (H) 65 - 99 mg/dL   Comment 1 Notify RN   Glucose, capillary     Status: Abnormal   Collection Time: 08/01/15  4:43 PM  Result Value Ref Range   Glucose-Capillary 108 (H) 65 - 99 mg/dL   Comment 1 Notify RN   Glucose, capillary     Status: Abnormal   Collection Time: 08/01/15  8:57 PM  Result Value Ref Range   Glucose-Capillary 121 (H) 65 - 99 mg/dL  Glucose, capillary     Status: Abnormal   Collection Time: 08/02/15  6:15 AM  Result Value Ref Range   Glucose-Capillary 157 (H) 65 - 99 mg/dL  Glucose, capillary     Status:  Abnormal   Collection Time: 08/02/15 11:41 AM  Result Value Ref Range   Glucose-Capillary 194 (H) 65 - 99 mg/dL   Comment 1 Notify RN     Blood Alcohol level:  Lab Results  Component Value Date   ETH <5 07/30/2015   ETH 19* 12/04/2014    Physical Findings: AIMS: Facial and Oral Movements Muscles of Facial Expression: None, normal Lips and Perioral Area: None, normal Jaw: None, normal Tongue: None, normal,Extremity Movements Upper (arms, wrists, hands, fingers): None, normal Lower (legs, knees, ankles, toes): None, normal, Trunk Movements Neck, shoulders, hips: None, normal, Overall Severity Severity of abnormal movements (highest score from questions above): None, normal Incapacitation due to abnormal movements: None, normal Patient's awareness of abnormal movements (rate only patient's report): No Awareness, Dental Status Current problems with teeth and/or dentures?: No Does patient usually wear dentures?: Yes  CIWA:    COWS:     Musculoskeletal: Strength & Muscle Tone: within normal limits Gait & Station: normal Patient leans: N/A  Psychiatric Specialty  Exam: Physical Exam  Constitutional: She is oriented to person, place, and time. She appears well-developed and well-nourished.  HENT:  Head: Normocephalic and atraumatic.  Eyes: EOM are normal.  Neck: Normal range of motion.  Respiratory: Effort normal.  Musculoskeletal: Normal range of motion.  Neurological: She is alert and oriented to person, place, and time.    Review of Systems  Constitutional: Negative.   HENT: Negative.   Eyes: Negative.   Respiratory: Negative.   Cardiovascular: Negative.   Gastrointestinal: Negative.   Genitourinary: Negative.   Musculoskeletal: Negative.   Skin: Negative.   Neurological: Negative.   Endo/Heme/Allergies: Negative.   Psychiatric/Behavioral: Negative.     Blood pressure 148/69, pulse 75, temperature 98.2 F (36.8 C), temperature source Oral, resp. rate 20, height 4\' 9"  (1.448 m), weight 110.224 kg (243 lb), SpO2 93 %.Body mass index is 52.57 kg/(m^2).  General Appearance: Fairly Groomed  Eye Contact:  Good  Speech:  Clear and Coherent  Volume:  Normal  Mood:  Dysphoric  Affect:  Congruent  Thought Process:  Linear and Descriptions of Associations: Intact  Orientation:  Full (Time, Place, and Person)  Thought Content:  Hallucinations: None  Suicidal Thoughts:  No  Homicidal Thoughts:  No  Memory:  Immediate;   Fair Recent;   Fair Remote;   Fair  Judgement:  Poor  Insight:  Shallow  Psychomotor Activity:  Normal  Concentration:  Concentration: Fair and Attention Span: Fair  Recall:  [employer] of Knowledge:  Fair  Language:  Fair  Akathisia:  No  Handed:    AIMS (if indicated):     Assets:  Armed forces logistics/support/administrative officer Social Support  ADL's:  Intact  Cognition:  WNL  Sleep:  Number of Hours: 7     Treatment Plan Summary:  Schizoaffective disorder patient states she has been receiving Haldol Decanoate 50 mg IM every 30 days. She is stated that she feels more depressed and more anxious 2 weeks prior to receiving her injection.  She reports that in the past she did better when she was receiving no 100 mg. I we will start Haldol oral 5 mg by mouth daily at bedtime and now will recommend to continue Haldol Decanoate 100 mg upon discharge. For depressive symptoms the patient will be continued on mirtazapine 45 mg a day.  Insomnia continue trazodone 100 mg by mouth daily at bedtime  Continue clonazepam 0.5 mg by mouth twice a day  Dyslipidemia continue Lipitor 20  mg q day  COPD continue inhalers albuterol, dulera and spiriva  Back pain continue Flexeril 10 mg by mouth 3 times a day, continue Voltaren gel, Lidoderm patches and Mobic  7.5 mg twice a day  Diabetic neuropathy continue Lyrica 100 mg tid  GERD continue Protonix  Hypertension continue Lopressor, lisinopril. PT and heart rate are well controlled  Edema continue Lasix 40 mg  Diabetes continue Levemir 50 U q am and 52 U qhs, metformin and tradjenta. Hemoglobin A1c was 6.6.  Precautions every 15 minute checks,   Diet diet low sodium and carb modified  Disposition once stabilized the patient will be discharged back to her apartment--Tomorrow  Follow-up she will continue to follow up with [name]  Social worker will contact the patient's daughter for collateral information. Most likely this patient will be discharge tomorrow  Hildred Priest, MD 08/02/2015, 1:31 PM

## 2015-08-02 NOTE — Tx Team (Signed)
Interdisciplinary Treatment Plan Update (Adult)        Date: 08/02/2015   Time Reviewed: 9:30 AM   Progress in Treatment: Improving  Attending groups: No Participating in groups: No Taking medication as prescribed: Yes  Tolerating medication: Yes  Family/Significant other contact made: Yes, CSW has spoken to the pt's daughter and CAP/DSS worker Janeice Robinson Patient understands diagnosis: Yes  Discussing patient identified problems/goals with staff: Yes  Medical problems stabilized or resolved: Yes  Denies suicidal/homicidal ideation: Yes  Issues/concerns per patient self-inventory: Yes  Other:   New problem(s) identified: N/A   Discharge Plan or Barriers: Pt will discharge to her home in [place] and will follow up for medication management and therapy with the [facility] ACT team.  Reason for Continuation of Hospitalization:   Depression   Anxiety   Medication Stabilization   Comments: N/A   Estimated length of stay: 3-5 days    Patient is 55 year old female admitted for overdosing on BC powders who presented with "I took a lot of pain medicine".  HPI: Patient interviewed. Chart reviewed. Vitals reviewed. This is a 55 year old woman with a history of schizoaffective disorder who comes into the hospital after overdosing on BC powders. She tells me that she took at least 20 of them today. When she first came in she had told some people that it was because of pain in her bladder. She tells me at first that it is because pain in her back but then tells me is because she is hopeless and feels like things will never get better. Rather than simply saying she took a lot of medicine she continues to say that she overdosed. Patient admits that she's been depressed and has had suicidal thoughts. Mood has been down consistently much worse for the last few weeks. Sleep is poor energy level is poor motivation is poor. She feels lonely most of the time. Denies any psychotic symptoms. She  says she has been compliant with her medicine.  Social history: Patient lives by herself in supervised housing. Says that she is pretty lonely most of the time although she does have family.  Medical history: Multiple medical problems including chronic back pain, chronic bladder pain, neurogenic bladder with a need to catheterize herself, diabetes, COPD, hypertension, sleep apnea  Substance abuse history: Denies any alcohol or drug abuse  Past Psychiatric History: Patient has a history of recurrent depressive symptoms. This is at least the third time she has overdosed on over-the-counter medicines. Admits that she stays depressed. She does take psychiatric medicines regularly along with the rest of her medicine. Has had several prior hospital stays.e. Patient lives in . Patient will benefit from crisis stabilization, medication evaluation, group therapy, and psycho education in addition to case management for discharge planning. Patient and CSW reviewed pt's identified goals and treatment plan. Pt verbalized understanding and agreed to treatment plan.    Review of initial/current patient goals per problem list:  1. Goal(s): Patient will participate in aftercare plan   Met: Yes  Target date: 3-5 days post admission date   As evidenced by: Patient will participate within aftercare plan AEB aftercare provider and housing plan at discharge being identified.   6/14: CSW assessing for appropriate contacts    6/16: Pt will discharge to her home in [place] and will follow up for medication management and therapy with the [facility] ACT team.    2. Goal (s): Patient will exhibit decreased depressive symptoms and suicidal ideations.  Met: No  Target date: 3-5 days post admission date   As evidenced by: Patient will utilize self-rating of depression at 3 or below and demonstrate decreased signs of depression or be deemed stable for discharge by MD.   6/14: Goal progressing.  6/16: Goal  progressing.    3. Goal(s): Patient will demonstrate decreased signs and symptoms of anxiety.   Met: No  Target date: 3-5 days post admission date   As evidenced by: Patient will utilize self-rating of anxiety at 3 or below and demonstrated decreased signs of anxiety, or be deemed stable for discharge by MD   6/14: Goal progressing.  6/16: Goal progressing.    4. Goal(s): Patient will demonstrate decreased signs of withdrawal due to substance abuse   Met: Yes  Target date: 3-5 days post admission date   As evidenced by: Patient will produce a CIWA/COWS score of 0, have stable vitals signs, and no symptoms of withdrawal   6/14: Patient produced a CIWA/COWS score of 0, has stable vitals signs, and no symptoms of withdrawal    Attendees:  Patient: Alinda Sierras Family:  Physician: Orlean Bradford, MD     08/02/2015 9:30 AM  Nursing: Lulu Riding, RN    08/02/2015 9:30 AM  Clinical Social Worker: Marylou Flesher, [place]  08/02/2015 9:30 AM  Clinical Social Worker: Glorious Peach, [place] 08/02/2015 9:30 AM  Recreational Therapist: Everitt Amber   08/02/2015 9:30 AM  Other:        08/02/2015 9:30 AM  Other:        08/02/2015 9:30 AM   Alphonse Guild. Meyli Boice, LCSWA, LCAS  08/02/15

## 2015-08-03 LAB — GLUCOSE, CAPILLARY
Glucose-Capillary: 168 mg/dL — ABNORMAL HIGH (ref 65–99)
Glucose-Capillary: 152 mg/dL — ABNORMAL HIGH (ref 65–99)

## 2015-08-03 NOTE — [facility] Group Notes (Signed)
[facility] Group Notes:  (Nursing/MHT/Case Management/Adjunct)  Date:  08/03/2015  Time:  1:32 AM  Type of Therapy:  Psychoeducational Skills  Participation Level:  Active  Participation Quality:  Sharing  Affect:  Not Congruent  Cognitive:  Oriented  Insight:  Lacking  Engagement in Group:  Distracting and Poor  Modes of Intervention:  Discussion and Exploration  Summary of Progress/Problems:  Ra Pfiester R Evona Westra 08/03/2015, 1:32 AM

## 2015-08-03 NOTE — Progress Notes (Signed)
Patient ID: Cristina [name], female   DOB: 11/18/1960, 55 y.o.   MRN: [id number]   Pt was discharged home with her daughter. Pt reported that she was ready for discharge, and that she had no issues or concerns. Pt reported that her depression was a 0, her anxiety was a 0, and her hopelessness was a 0. Pt reported that she was negative SI/HI, no AH/VH noted. Pt was given her discharge instructions, transition report, discharge SRA, and prescriptions. No questions or concerns noted.

## 2015-08-03 NOTE — Progress Notes (Signed)
  [facility] Adult Case Management Discharge Plan :  Will you be returning to the same living situation after discharge:  Yes,  home  At discharge, do you have transportation home?: Yes,  daughter  Do you have the ability to pay for your medications: Yes,  insurance, income   Release of information consent forms completed and in the chart;  Patient's signature needed at discharge.  Patient to Follow up at: Follow-up Information    Call [facility] .   Why:  We have faxed your information, with your permission to your home health care provider so they ca assess you for weekend homehealthcare, in addtion to your current weekday care. Please call to confirm.   Contact information:   [facility]  [address] [place], [place] [address] Ph: [phone] Fax: [phone]      Follow up with [name].   Why:  Thayer Headings and dr. Loni Muse from [facility] will visit you in your home at 1pm on Wednesday June 21st, 2017 for your hospital follow up for medication managment and therapy   Contact information:   [facility] [address] [place], [place] [address] Ph: [phone] Fax: [phone]      Next level of care provider has access to [place] and Suicide Prevention discussed: Yes,  with patient and daughter  Have you used any form of tobacco in the last 30 days? (Cigarettes, Smokeless Tobacco, Cigars, and/or Pipes): No  Has patient been referred to the Quitline?: N/A patient is not a smoker  Patient has been referred for addiction treatment: [place] MSW, LCSWA  08/03/2015, 9:50 AM

## 2015-08-12 ENCOUNTER — Ambulatory Visit: Payer: Medicare Other | Admitting: Anesthesiology

## 2015-08-12 ENCOUNTER — Ambulatory Visit
Admission: RE | Admit: 2015-08-12 | Discharge: 2015-08-12 | Disposition: A | Discharge status: Home / Self Care | Payer: Medicare Other | Source: Ambulatory Visit | Attending: Gastroenterology | Admitting: Gastroenterology

## 2015-08-12 ENCOUNTER — Encounter: Payer: Self-pay | Admitting: Anesthesiology

## 2015-08-12 ENCOUNTER — Encounter
Admission: RE | Disposition: A | Discharge status: Home / Self Care | Payer: Self-pay | Source: Ambulatory Visit | Attending: Gastroenterology

## 2015-08-12 DIAGNOSIS — K921 Melena: Secondary | ICD-10-CM | POA: Diagnosis not present

## 2015-08-12 DIAGNOSIS — Z7982 Long term (current) use of aspirin: Secondary | ICD-10-CM | POA: Diagnosis not present

## 2015-08-12 DIAGNOSIS — K5730 Diverticulosis of large intestine without perforation or abscess without bleeding: Secondary | ICD-10-CM | POA: Diagnosis not present

## 2015-08-12 DIAGNOSIS — B3781 Candidal esophagitis: Secondary | ICD-10-CM | POA: Insufficient documentation

## 2015-08-12 DIAGNOSIS — G4730 Sleep apnea, unspecified: Secondary | ICD-10-CM | POA: Insufficient documentation

## 2015-08-12 DIAGNOSIS — D125 Benign neoplasm of sigmoid colon: Secondary | ICD-10-CM | POA: Diagnosis not present

## 2015-08-12 DIAGNOSIS — K621 Rectal polyp: Secondary | ICD-10-CM | POA: Diagnosis not present

## 2015-08-12 DIAGNOSIS — I509 Heart failure, unspecified: Secondary | ICD-10-CM | POA: Insufficient documentation

## 2015-08-12 DIAGNOSIS — K210 Gastro-esophageal reflux disease with esophagitis: Secondary | ICD-10-CM | POA: Insufficient documentation

## 2015-08-12 DIAGNOSIS — R1013 Epigastric pain: Secondary | ICD-10-CM | POA: Diagnosis present

## 2015-08-12 DIAGNOSIS — K2210 Ulcer of esophagus without bleeding: Secondary | ICD-10-CM | POA: Insufficient documentation

## 2015-08-12 DIAGNOSIS — F319 Bipolar disorder, unspecified: Secondary | ICD-10-CM | POA: Diagnosis not present

## 2015-08-12 DIAGNOSIS — Z79899 Other long term (current) drug therapy: Secondary | ICD-10-CM | POA: Insufficient documentation

## 2015-08-12 DIAGNOSIS — M1990 Unspecified osteoarthritis, unspecified site: Secondary | ICD-10-CM | POA: Insufficient documentation

## 2015-08-12 DIAGNOSIS — K2950 Unspecified chronic gastritis without bleeding: Secondary | ICD-10-CM | POA: Diagnosis not present

## 2015-08-12 DIAGNOSIS — F209 Schizophrenia, unspecified: Secondary | ICD-10-CM | POA: Insufficient documentation

## 2015-08-12 DIAGNOSIS — K640 First degree hemorrhoids: Secondary | ICD-10-CM | POA: Diagnosis not present

## 2015-08-12 DIAGNOSIS — I110 Hypertensive heart disease with heart failure: Secondary | ICD-10-CM | POA: Diagnosis not present

## 2015-08-12 DIAGNOSIS — J449 Chronic obstructive pulmonary disease, unspecified: Secondary | ICD-10-CM | POA: Diagnosis not present

## 2015-08-12 DIAGNOSIS — Z9981 Dependence on supplemental oxygen: Secondary | ICD-10-CM | POA: Insufficient documentation

## 2015-08-12 DIAGNOSIS — Z86010 Personal history of colonic polyps: Secondary | ICD-10-CM | POA: Diagnosis not present

## 2015-08-12 HISTORY — PX: COLONOSCOPY WITH PROPOFOL: SHX5780

## 2015-08-12 HISTORY — PX: ESOPHAGOGASTRODUODENOSCOPY (EGD) WITH PROPOFOL: SHX5813

## 2015-08-12 LAB — KOH PREP

## 2015-08-12 LAB — GLUCOSE, CAPILLARY: Glucose-Capillary: 129 mg/dL — ABNORMAL HIGH (ref 65–99)

## 2015-08-12 LAB — SALICYLATE LEVEL: Salicylate Lvl: <4 mg/dL (ref 2.8–30.0)

## 2015-08-12 LAB — ACETAMINOPHEN LEVEL: Acetaminophen (Tylenol), Serum: <10 ug/mL — ABNORMAL LOW (ref 10–30)

## 2015-08-12 SURGERY — COLONOSCOPY WITH PROPOFOL
Anesthesia: General

## 2015-08-12 MED ORDER — SODIUM CHLORIDE 0.9 % IV SOLN
INTRAVENOUS | Status: DC
  Administered 2015-08-12: 11:00:00 via INTRAVENOUS

## 2015-08-12 MED ORDER — KETAMINE HCL 10 MG/ML IJ SOLN
INTRAMUSCULAR | Status: DC | PRN
  Administered 2015-08-12 (×3): 10 mg via INTRAVENOUS

## 2015-08-12 MED ORDER — IPRATROPIUM-ALBUTEROL 0.5-2.5 (3) MG/3ML IN SOLN
3.0000 mL | Freq: Once | RESPIRATORY_TRACT | Status: AC
  Administered 2015-08-12: 11:00:00 via RESPIRATORY_TRACT

## 2015-08-12 MED ORDER — IPRATROPIUM-ALBUTEROL 0.5-2.5 (3) MG/3ML IN SOLN
RESPIRATORY_TRACT | Status: AC
  Filled 2015-08-12: qty 3

## 2015-08-12 MED ORDER — PROPOFOL 500 MG/50ML IV EMUL
INTRAVENOUS | Status: DC | PRN
  Administered 2015-08-12: 125 ug/kg/min via INTRAVENOUS

## 2015-08-12 MED ORDER — SODIUM CHLORIDE 0.9 % IV SOLN: INTRAVENOUS | Status: DC

## 2015-08-12 MED ORDER — PHENYLEPHRINE HCL 10 MG/ML IJ SOLN
INTRAMUSCULAR | Status: DC | PRN
  Administered 2015-08-12 (×3): 100 ug via INTRAVENOUS

## 2015-08-12 MED ORDER — DEXMEDETOMIDINE HCL IN NACL 200 MCG/50ML IV SOLN
INTRAVENOUS | Status: DC | PRN
  Administered 2015-08-12 (×2): 8 ug via INTRAVENOUS

## 2015-08-12 MED ORDER — METOPROLOL TARTRATE 5 MG/5ML IV SOLN
INTRAVENOUS | Status: DC | PRN
  Administered 2015-08-12: 2 mg via INTRAVENOUS

## 2015-08-12 MED ORDER — GLYCOPYRROLATE 0.2 MG/ML IJ SOLN
INTRAMUSCULAR | Status: DC | PRN
  Administered 2015-08-12: 0.2 mg via INTRAVENOUS

## 2015-08-12 NOTE — Transfer of Care (Signed)
Immediate Anesthesia Transfer of Care Note  Patient: Cristina [name]  Procedure(s) Performed: Procedure(s): COLONOSCOPY WITH PROPOFOL (N/A) ESOPHAGOGASTRODUODENOSCOPY (EGD) WITH PROPOFOL (N/A)  Patient Location: PACU  Anesthesia Type:General  Level of Consciousness: awake  Airway & Oxygen Therapy: Patient connected to nasal cannula oxygen  Post-op Assessment: Report given to RN  Post vital signs: stable  Last Vitals:  Filed Vitals:   08/12/15 1037  BP: 143/85  Pulse: 94  Temp: 37.3 C  Resp: 18    Last Pain: There were no vitals filed for this visit.       Complications: No apparent anesthesia complications

## 2015-08-12 NOTE — Anesthesia Preprocedure Evaluation (Signed)
Anesthesia Evaluation  Patient identified by MRN, date of birth, ID band Patient awake    Reviewed: Allergy & Precautions, H&P , NPO status , Patient's Chart, lab work & pertinent test results  History of Anesthesia Complications Negative for: history of anesthetic complications  Airway Mallampati: III  TM Distance: >3 FB Neck ROM: limited    Dental  (+) Poor Dentition, Missing, Edentulous Upper, Edentulous Lower   Pulmonary shortness of breath and with exertion, asthma , sleep apnea , COPD,  COPD inhaler and oxygen dependent, former smoker,    Pulmonary exam normal breath sounds clear to auscultation       Cardiovascular Exercise Tolerance: Poor hypertension, (-) angina+ Past MI, +CHF and + DOE  Normal cardiovascular exam Rhythm:regular Rate:Normal     Neuro/Psych PSYCHIATRIC DISORDERS Bipolar Disorder Schizophrenia  Neuromuscular disease CVA, Residual Symptoms    GI/Hepatic Neg liver ROS, GERD  Controlled,  Endo/Other  negative endocrine ROSdiabetes, Type 2  Renal/GU negative Renal ROS  negative genitourinary   Musculoskeletal  (+) Arthritis ,   Abdominal   Peds  Hematology negative hematology ROS (+)   Anesthesia Other Findings Past Medical History:   Asthma                                                       CHF (congestive heart failure) (HCC)                         Diabetes mellitus without complication ([place])                 History of cholecystectomy                                     Comment:hx of cholecystectomy   H/O tubal ligation                                             Comment:hx btl   Hypertension                                                 Stroke ([place])                                                 Abdominal hernia                                             Fatty tumor                                                  Sleep apnea  COPD  (chronic obstructive pulmonary disease) (*              H/O colonoscopy                                              Bladder dysfunction                                          Muscle cramps                                                Vaginitis                                                    Recurrent boils                                              Dyspnea                                                      Edema                                                        DDD (degenerative disc disease), lumbar                      UTI (lower urinary tract infection)                          Carpal tunnel syndrome                                       Neuropathy (HCC)                                             Obesity                                                      Arthritis  Schizoaffective disorder (HCC)                               GERD (gastroesophageal reflux disease)                       HLD (hyperlipidemia)                                        Past Surgical History:   CHOLECYSTECTOMY                                               tubal ligation                                                CARDIAC CATHETERIZATION                         Left 09/24/2014       Comment:Procedure: Left Heart Cath and Coronary               Angiography;  Surgeon: Dionisio David, MD;                Location: [facility];  Service:               Cardiovascular;  Laterality: Left;  BMI    Body Mass Index   52.57 kg/m 2      Reproductive/Obstetrics negative OB ROS                             Anesthesia Physical Anesthesia Plan  ASA: IV  Anesthesia Plan: General   Post-op Pain Management:    Induction:   Airway Management Planned:   Additional Equipment:   Intra-op Plan:   Post-operative Plan:   Informed Consent: I have reviewed the patients History and Physical, chart, labs and  discussed the procedure including the risks, benefits and alternatives for the proposed anesthesia with the patient or authorized representative who has indicated his/her understanding and acceptance.   Dental Advisory Given  Plan Discussed with: Anesthesiologist, CRNA and Surgeon  Anesthesia Plan Comments:         Anesthesia Quick Evaluation

## 2015-08-12 NOTE — Op Note (Signed)
[facility] Gastroenterology Patient Name: Cristina [name] Procedure Date: 08/12/2015 11:08 AM MRN: [id number] Account #: [id number] Date of Birth: 12-31-1960 Admit Type: Outpatient Age: 55 Room: [facility] ENDO ROOM 4 Gender: Female Note Status: Finalized Procedure:            Upper GI endoscopy Indications:          Epigastric abdominal pain, Dyspepsia, Diarrhea Providers:            Lollie Sails, MD Referring MD:         Baltazar Apo, MD (Referring MD) Medicines:            Monitored Anesthesia Care Complications:        No immediate complications. Procedure:            Pre-Anesthesia Assessment:                       - ASA Grade Assessment: III - A patient with severe                        systemic disease.                       After obtaining informed consent, the endoscope was                        passed under direct vision. Throughout the procedure,                        the patient's blood pressure, pulse, and oxygen                        saturations were monitored continuously. The Endoscope                        was introduced through the mouth, and advanced to the                        third part of duodenum. The patient tolerated the                        procedure well. Findings:      Diffuse candidiasis was found in the upper third of the esophagus, in       the middle third of the esophagus and in the lower third of the       esophagus. Cells for cytology were obtained by brushing.      LA Grade B (one or more mucosal breaks greater than 5 mm, not extending       between the tops of two mucosal folds) esophagitis with friable was       found. Biopsies were taken with a cold forceps for histology.      Patchy minimal inflammation characterized by erythema was found in the       gastric body. Biopsies were taken with a cold forceps for histology.       Biopsies were taken with a cold forceps for Helicobacter pylori testing.      Diffuse mild  mucosal variance characterized by altered texture was found       in the second portion of the duodenum and in the third portion of the       duodenum. Biopsies were taken  with a cold forceps for histology.      The exam of the duodenum was otherwise normal.      The cardia and gastric fundus were normal on retroflexion. Impression:           - Monilial esophagitis. Cells for cytology obtained.                       - LA Grade B erosive esophagitis. Biopsied.                       - Gastritis. Biopsied.                       - Mucosal variant in the duodenum. Biopsied. Recommendation:       - Use Protonix (pantoprazole) 40 mg PO daily daily.                       - Await pathology results. Procedure Code(s):    --- Professional ---                       [phone], Esophagogastroduodenoscopy, flexible, transoral;                        with biopsy, single or multiple Diagnosis Code(s):    --- Professional ---                       B37.81, Candidal esophagitis                       K20.8, Other esophagitis                       K29.70, Gastritis, unspecified, without bleeding                       K31.89, Other diseases of stomach and duodenum                       R10.13, Epigastric pain                       R19.7, Diarrhea, unspecified CPT copyright 2016 American Medical Association. All rights reserved. The codes documented in this report are preliminary and upon coder review may  be revised to meet current compliance requirements. Lollie Sails, MD 08/12/2015 1:07:27 PM This report has been signed electronically. Number of Addenda: 0 Note Initiated On: 08/12/2015 11:08 AM      [facility]

## 2015-08-12 NOTE — Consult Note (Addendum)
Outpatient short stay form Pre-procedure 08/12/2015 11:09 AM Lollie Sails MD  Primary Physician: Dr. Denton Lank  Reason for visit:  EGD and colonoscopy  History of present illness:  Patient is a 55 year old female presenting today for procedures as above. She has a personal history of colon polyps in the past has not had a colonoscopy since 2009. There is a family history of colon cancer in her grandmother. She also has been taking Goody powders in the past apparently not for a couple of weeks. She does take 81 mg aspirin daily but denies use of any blood thinning agents. She has issues of abdominal pain, dyspepsia, change of bowel habits with rectal bleeding and diarrhea. Iron deficiency anemia. Family history of colon polyps in her mother. She was recently changed from a H2 receptor antagonist to pantoprazole 40 mg which has been beneficial for her symptoms. Her symptom of dysphagia is chronic began with a stroke in the year 2000 and has been relatively unchanged. She had a normal barium swallow done on 07/16/2015 this being a modified barium swallow.    Current facility-administered medications:  .  0.9 %  sodium chloride infusion, , Intravenous, Continuous, Lollie Sails, MD, Last Rate: 20 mL/hr at 08/12/15 1054 .  0.9 %  sodium chloride infusion, , Intravenous, Continuous, Lollie Sails, MD  Prescriptions prior to admission  Medication Sig Dispense Refill Last Dose  . albuterol (PROVENTIL HFA;VENTOLIN HFA) 108 (90 BASE) MCG/ACT inhaler Inhale 2 puffs into the lungs every 6 (six) hours as needed for wheezing or shortness of breath.   08/11/2015 at Unknown time  . aspirin EC 81 MG tablet Take 81 mg by mouth daily.   Past Week at Unknown time  . atorvastatin (LIPITOR) 20 MG tablet Take 20 mg by mouth at bedtime.   Past Week at Unknown time  . meloxicam (MOBIC) 7.5 MG tablet Take 1 tablet (7.5 mg total) by mouth 2 (two) times daily. 60 tablet 0 08/11/2015 at Unknown time  .  metoprolol (LOPRESSOR) 50 MG tablet Take 50 mg by mouth 2 (two) times daily.   08/11/2015 at Unknown time  . quinapril (ACCUPRIL) 20 MG tablet Take 20 mg by mouth daily.   08/11/2015 at Unknown time  . tiotropium (SPIRIVA) 18 MCG inhalation capsule Place 18 mcg into inhaler and inhale daily.   08/11/2015 at Unknown time  . cetirizine (ZYRTEC) 10 MG tablet Take 10 mg by mouth daily as needed for allergies.   12/04/2014 at 1400  . clonazePAM (KLONOPIN) 0.5 MG tablet Take 0.5 mg by mouth 2 (two) times daily.   12/04/2014 at 1400  . cyclobenzaprine (FLEXERIL) 10 MG tablet Take 1 tablet (10 mg total) by mouth 3 (three) times daily. 90 tablet 0   . diclofenac sodium (VOLTAREN) 1 % GEL Apply 2 g topically 4 (four) times daily as needed (for pain).   Past Week at Unknown time  . esomeprazole (NEXIUM) 40 MG capsule Take 40 mg by mouth daily.    12/04/2014 at 0700  . Fluticasone-Salmeterol (ADVAIR) 250-50 MCG/DOSE AEPB Inhale 1 puff into the lungs 2 (two) times daily.   Past Week at Unknown time  . furosemide (LASIX) 40 MG tablet Take 1 tablet (40 mg total) by mouth daily. 30 tablet 0   . haloperidol (HALDOL) 5 MG tablet Take 1 tablet (5 mg total) by mouth at bedtime. 30 tablet 0   . haloperidol decanoate (HALDOL DECANOATE) 100 MG/ML injection Inject 1 mL (100 mg total) into  the muscle every 30 (thirty) days. 1 mL 0   . insulin detemir (LEVEMIR) 100 UNIT/ML injection Inject 50-52 Units into the skin 2 (two) times daily. Pt uses 50 units in the morning and 52 units at bedtime.   12/04/2014 at Unknown time  . ketoconazole (NIZORAL) 2 % shampoo Apply 1 application topically every other day.   Past Week at Unknown time  . lidocaine (LIDODERM) 5 % Place 1 patch onto the skin every 12 (twelve) hours. Remove & Discard patch within 12 hours or as directed by MD   Past Week at Unknown time  . linagliptin (TRADJENTA) 5 MG TABS tablet Take 5 mg by mouth daily.   Past Month at Unknown time  . mirtazapine (REMERON) 45 MG  tablet Take 45 mg by mouth at bedtime.   12/04/2014 at 1400  . pregabalin (LYRICA) 100 MG capsule Take 100 mg by mouth 3 (three) times daily as needed (for nerve pain).   12/04/2014 at 1400  . traZODone (DESYREL) 100 MG tablet Take 1 tablet (100 mg total) by mouth at bedtime as needed for sleep. 30 tablet 0      Allergies  Allergen Reactions  . Shellfish-Derived Products Anaphylaxis and Shortness Of Breath    Stated by Patient  . Codeine   . Thorazine [Chlorpromazine] Swelling  . Tylenol With Codeine #3 [Acetaminophen-Codeine] Other (See Comments)    Hallucinations     Past Medical History  Diagnosis Date  . Asthma   . CHF (congestive heart failure) ([place])   . Diabetes mellitus without complication ([place])   . History of cholecystectomy     hx of cholecystectomy  . H/O tubal ligation     hx btl  . Hypertension   . Stroke ([place])   . Abdominal hernia   . Fatty tumor   . Sleep apnea   . COPD (chronic obstructive pulmonary disease) ([place])   . H/O colonoscopy   . Bladder dysfunction   . Muscle cramps   . Vaginitis   . Recurrent boils   . Dyspnea   . Edema   . DDD (degenerative disc disease), lumbar   . UTI (lower urinary tract infection)   . Carpal tunnel syndrome   . Neuropathy ([place])   . Obesity   . Arthritis   . Schizoaffective disorder ([place])   . GERD (gastroesophageal reflux disease)   . HLD (hyperlipidemia)     Review of systems:      Physical Exam    Heart and lungs: Regular rate and rhythm without rub or gallop, lungs are bilaterally clear.    HEENT: Normocephalic atraumatic eyes are anicteric    Other:     Pertinant exam for procedure: Soft nontender nondistended, obese, bowel sounds positive normoactive. Not possible palpate internal organs.    Planned proceedures: EGD and colonoscopy with indicated procedures. I have discussed the risks benefits and complications of procedures to include not limited to bleeding, infection, perforation and the risk of  sedation and the patient wishes to proceed.    Lollie Sails, MD Gastroenterology 08/12/2015  11:09 AM     Due to recent hospitalization for ASA overdose, ASA and tylenol levels checked, found normal.

## 2015-08-12 NOTE — Anesthesia Postprocedure Evaluation (Signed)
Anesthesia Post Note  Patient: Cristina [name]  Procedure(s) Performed: Procedure(s) (LRB): COLONOSCOPY WITH PROPOFOL (N/A) ESOPHAGOGASTRODUODENOSCOPY (EGD) WITH PROPOFOL (N/A)  Patient location during evaluation: Endoscopy Anesthesia Type: General Level of consciousness: awake and alert Pain management: pain level controlled Vital Signs Assessment: post-procedure vital signs reviewed and stable Respiratory status: spontaneous breathing, nonlabored ventilation, respiratory function stable and patient connected to nasal cannula oxygen Cardiovascular status: blood pressure returned to baseline and stable Postop Assessment: no signs of nausea or vomiting Anesthetic complications: no    Last Vitals:  Filed Vitals:   08/12/15 1410 08/12/15 1420  BP: 132/61 115/63  Pulse: 110 104  Temp:    Resp: 15 19    Last Pain: There were no vitals filed for this visit.               Zaim Nitta S

## 2015-08-12 NOTE — Op Note (Signed)
[facility] Gastroenterology Patient Name: Cristina [name] Procedure Date: 08/12/2015 11:07 AM MRN: [id number] Account #: [id number] Date of Birth: 07-27-1960 Admit Type: Outpatient Age: 55 Room: [facility] ENDO ROOM 4 Gender: Female Note Status: Finalized Procedure:            Colonoscopy Indications:          Clinically significant diarrhea of unexplained origin,                        Hematochezia, Personal history of colonic polyps Providers:            Lollie Sails, MD Referring MD:         Baltazar Apo, MD (Referring MD) Medicines:            Monitored Anesthesia Care Complications:        No immediate complications. Procedure:            Pre-Anesthesia Assessment:                       - ASA Grade Assessment: III - A patient with severe                        systemic disease.                       After obtaining informed consent, the colonoscope was                        passed under direct vision. Throughout the procedure,                        the patient's blood pressure, pulse, and oxygen                        saturations were monitored continuously. The                        Colonoscope was introduced through the anus with the                        intention of advancing to the cecum. The scope was                        advanced to the ascending colon before the procedure                        was aborted. Medications were given. The colonoscopy                        was unusually difficult due to poor bowel prep,                        significant looping and the patient's body habitus.                        Successful completion of the procedure was aided by                        using manual pressure. The patient tolerated the  procedure well. The quality of the bowel preparation                        was fair except the ascending colon was poor. The scope                        was advanced to the mid ascending colon  the IC valve                        seen in the distance normal, however despite multiple                        maneuvers was not able to move into the cecum. Findings:      Four sessile polyps were found in the rectum. The polyps were 2 to 3 mm       in size. These polyps were removed with a cold biopsy forceps. Resection       and retrieval were complete.      A 4 mm polyp was found in the proximal sigmoid colon. The polyp was       sessile. The polyp was removed with a cold snare. Resection and       retrieval were complete.      A 3 mm polyp was found in the distal sigmoid colon. The polyp was       sessile. The polyp was removed with a cold biopsy forceps. The polyp was       removed with a cold snare. The polyp was removed with a lift and cut       technique using a cold snare. Resection and retrieval were complete.      Biopsies for histology were taken with a cold forceps from the right       colon and left colon for evaluation of microscopic colitis.      Multiple small-mouthed diverticula were found in the sigmoid colon and       distal descending colon.      The digital rectal exam was normal.      Non-bleeding internal hemorrhoids were found during anoscopy. The       hemorrhoids were small and Grade I (internal hemorrhoids that do not       prolapse). Impression:           - Four 2 to 3 mm polyps in the rectum, removed with a                        cold biopsy forceps. Resected and retrieved.                       - One 4 mm polyp in the proximal sigmoid colon, removed                        with a cold snare. Resected and retrieved.                       - One 3 mm polyp in the distal sigmoid colon, removed                        with a cold snare, removed using lift and cut and a  cold snare and removed with a cold biopsy forceps.                        Resected and retrieved.                       - Diverticulosis in the sigmoid colon and in the  distal                        descending colon.                       - Non-bleeding internal hemorrhoids.                       - Biopsies were taken with a cold forceps from the                        right colon and left colon for evaluation of                        microscopic colitis. Recommendation:       - Await pathology results. Procedure Code(s):    --- Professional ---                       [phone], 52, Colonoscopy, flexible; with removal of                        tumor(s), polyp(s), or other lesion(s) by snare                        technique                       [id number], 59,52, Colonoscopy, flexible; with biopsy,                        single or multiple Diagnosis Code(s):    --- Professional ---                       K62.1, Rectal polyp                       D12.5, Benign neoplasm of sigmoid colon                       K64.0, First degree hemorrhoids                       R19.7, Diarrhea, unspecified                       K92.1, Melena (includes Hematochezia)                       Z86.010, Personal history of colonic polyps                       K57.30, Diverticulosis of large intestine without                        perforation or abscess without bleeding CPT copyright 2016 American Medical Association. All rights reserved. The codes documented in this report  are preliminary and upon coder review may  be revised to meet current compliance requirements. Lollie Sails, MD 08/12/2015 2:00:02 PM This report has been signed electronically. Number of Addenda: 0 Note Initiated On: 08/12/2015 11:07 AM Total Procedure Duration: 0 hours 41 minutes 0 seconds       [facility]

## 2015-08-13 ENCOUNTER — Encounter: Payer: Self-pay | Admitting: Gastroenterology

## 2015-08-14 LAB — SURGICAL PATHOLOGY

## 2015-10-28 ENCOUNTER — Emergency Department: Payer: Medicare Other

## 2015-10-28 ENCOUNTER — Inpatient Hospital Stay
Admission: EM | Admit: 2015-10-28 | Discharge: 2015-10-31 | DRG: 871 | Disposition: A | Discharge status: Skilled Nursing Facility | Payer: Medicare Other | Attending: Internal Medicine | Admitting: Internal Medicine

## 2015-10-28 DIAGNOSIS — Z9851 Tubal ligation status: Secondary | ICD-10-CM | POA: Diagnosis not present

## 2015-10-28 DIAGNOSIS — Z9049 Acquired absence of other specified parts of digestive tract: Secondary | ICD-10-CM | POA: Diagnosis not present

## 2015-10-28 DIAGNOSIS — K219 Gastro-esophageal reflux disease without esophagitis: Secondary | ICD-10-CM | POA: Diagnosis present

## 2015-10-28 DIAGNOSIS — Z8673 Personal history of transient ischemic attack (TIA), and cerebral infarction without residual deficits: Secondary | ICD-10-CM

## 2015-10-28 DIAGNOSIS — E119 Type 2 diabetes mellitus without complications: Secondary | ICD-10-CM | POA: Diagnosis present

## 2015-10-28 DIAGNOSIS — Z8249 Family history of ischemic heart disease and other diseases of the circulatory system: Secondary | ICD-10-CM | POA: Diagnosis not present

## 2015-10-28 DIAGNOSIS — E785 Hyperlipidemia, unspecified: Secondary | ICD-10-CM | POA: Diagnosis present

## 2015-10-28 DIAGNOSIS — M5136 Other intervertebral disc degeneration, lumbar region: Secondary | ICD-10-CM | POA: Diagnosis present

## 2015-10-28 DIAGNOSIS — G9341 Metabolic encephalopathy: Secondary | ICD-10-CM | POA: Diagnosis present

## 2015-10-28 DIAGNOSIS — Z87891 Personal history of nicotine dependence: Secondary | ICD-10-CM

## 2015-10-28 DIAGNOSIS — Z794 Long term (current) use of insulin: Secondary | ICD-10-CM | POA: Diagnosis not present

## 2015-10-28 DIAGNOSIS — E669 Obesity, unspecified: Secondary | ICD-10-CM | POA: Diagnosis present

## 2015-10-28 DIAGNOSIS — N179 Acute kidney failure, unspecified: Secondary | ICD-10-CM

## 2015-10-28 DIAGNOSIS — Z888 Allergy status to other drugs, medicaments and biological substances status: Secondary | ICD-10-CM

## 2015-10-28 DIAGNOSIS — Z886 Allergy status to analgesic agent status: Secondary | ICD-10-CM

## 2015-10-28 DIAGNOSIS — J449 Chronic obstructive pulmonary disease, unspecified: Secondary | ICD-10-CM | POA: Diagnosis present

## 2015-10-28 DIAGNOSIS — N390 Urinary tract infection, site not specified: Secondary | ICD-10-CM | POA: Diagnosis not present

## 2015-10-28 DIAGNOSIS — G4733 Obstructive sleep apnea (adult) (pediatric): Secondary | ICD-10-CM | POA: Diagnosis present

## 2015-10-28 DIAGNOSIS — Z6841 Body Mass Index (BMI) 40.0 and over, adult: Secondary | ICD-10-CM

## 2015-10-28 DIAGNOSIS — Z7982 Long term (current) use of aspirin: Secondary | ICD-10-CM

## 2015-10-28 DIAGNOSIS — A419 Sepsis, unspecified organism: Secondary | ICD-10-CM | POA: Diagnosis not present

## 2015-10-28 DIAGNOSIS — R6521 Severe sepsis with septic shock: Secondary | ICD-10-CM

## 2015-10-28 DIAGNOSIS — I110 Hypertensive heart disease with heart failure: Secondary | ICD-10-CM | POA: Diagnosis present

## 2015-10-28 DIAGNOSIS — I509 Heart failure, unspecified: Secondary | ICD-10-CM | POA: Diagnosis present

## 2015-10-28 DIAGNOSIS — N170 Acute kidney failure with tubular necrosis: Secondary | ICD-10-CM | POA: Diagnosis present

## 2015-10-28 DIAGNOSIS — Z79899 Other long term (current) drug therapy: Secondary | ICD-10-CM | POA: Diagnosis not present

## 2015-10-28 DIAGNOSIS — Z9889 Other specified postprocedural states: Secondary | ICD-10-CM | POA: Diagnosis not present

## 2015-10-28 DIAGNOSIS — R571 Hypovolemic shock: Secondary | ICD-10-CM | POA: Diagnosis not present

## 2015-10-28 DIAGNOSIS — W19XXXA Unspecified fall, initial encounter: Secondary | ICD-10-CM | POA: Diagnosis present

## 2015-10-28 DIAGNOSIS — I959 Hypotension, unspecified: Secondary | ICD-10-CM

## 2015-10-28 DIAGNOSIS — R4182 Altered mental status, unspecified: Secondary | ICD-10-CM

## 2015-10-28 LAB — COMPREHENSIVE METABOLIC PANEL
ALT: 96 U/L — ABNORMAL HIGH (ref 14–54)
AST: 125 U/L — ABNORMAL HIGH (ref 15–41)
Albumin: 2.6 g/dL — ABNORMAL LOW (ref 3.5–5.0)
Alkaline Phosphatase: 112 U/L (ref 38–126)
Anion gap: 15 (ref 5–15)
BUN: 78 mg/dL — ABNORMAL HIGH (ref 6–20)
CO2: 19 mmol/L — ABNORMAL LOW (ref 22–32)
Calcium: 8.9 mg/dL (ref 8.9–10.3)
Chloride: 102 mmol/L (ref 101–111)
Creatinine, Ser: 3.05 mg/dL — ABNORMAL HIGH (ref 0.44–1.00)
GFR calc Af Amer: 18 mL/min — ABNORMAL LOW (ref >60)
GFR calc non Af Amer: 16 mL/min — ABNORMAL LOW (ref >60)
Glucose, Bld: 159 mg/dL — ABNORMAL HIGH (ref 65–99)
Potassium: 3.8 mmol/L (ref 3.5–5.1)
Sodium: 136 mmol/L (ref 135–145)
Total Bilirubin: 0.6 mg/dL (ref 0.3–1.2)
Total Protein: 6.5 g/dL (ref 6.5–8.1)

## 2015-10-28 LAB — URINALYSIS COMPLETE WITH MICROSCOPIC (ARMC ONLY)
Bilirubin Urine: NEGATIVE
Glucose, UA: NEGATIVE mg/dL
Ketones, ur: NEGATIVE mg/dL
Nitrite: NEGATIVE
Protein, ur: 30 mg/dL — AB
Specific Gravity, Urine: 1.014 (ref 1.005–1.030)
pH: 5 (ref 5.0–8.0)

## 2015-10-28 LAB — CBC WITH DIFFERENTIAL/PLATELET
Basophils Absolute: 0.1 10*3/uL (ref 0–0.1)
Basophils Relative: 1 %
Eosinophils Absolute: 0.1 10*3/uL (ref 0–0.7)
Eosinophils Relative: 1 %
HCT: 33.9 % — ABNORMAL LOW (ref 35.0–47.0)
Hemoglobin: 11.2 g/dL — ABNORMAL LOW (ref 12.0–16.0)
Lymphocytes Relative: 7 %
Lymphs Abs: 0.9 10*3/uL — ABNORMAL LOW (ref 1.0–3.6)
MCH: 25.1 pg — ABNORMAL LOW (ref 26.0–34.0)
MCHC: 33 g/dL (ref 32.0–36.0)
MCV: 76.1 fL — ABNORMAL LOW (ref 80.0–100.0)
Monocytes Absolute: 1 10*3/uL — ABNORMAL HIGH (ref 0.2–0.9)
Monocytes Relative: 9 %
Neutro Abs: 9.6 10*3/uL — ABNORMAL HIGH (ref 1.4–6.5)
Neutrophils Relative %: 82 %
Platelets: 208 10*3/uL (ref 150–440)
RBC: 4.45 MIL/uL (ref 3.80–5.20)
RDW: 21.5 % — ABNORMAL HIGH (ref 11.5–14.5)
WBC: 11.8 10*3/uL — ABNORMAL HIGH (ref 3.6–11.0)

## 2015-10-28 LAB — TROPONIN I: Troponin I: <0.03 ng/mL (ref <0.03)

## 2015-10-28 LAB — TYPE AND SCREEN
ABO/RH(D): O POS
Antibody Screen: NEGATIVE

## 2015-10-28 LAB — T4, FREE: Free T4: 1.09 ng/dL (ref 0.61–1.12)

## 2015-10-28 LAB — LACTIC ACID, PLASMA: Lactic Acid, Venous: 1.5 mmol/L (ref 0.5–1.9)

## 2015-10-28 LAB — GLUCOSE, CAPILLARY: Glucose-Capillary: 125 mg/dL — ABNORMAL HIGH (ref 65–99)

## 2015-10-28 LAB — AMMONIA: Ammonia: 9 umol/L (ref 9–35)

## 2015-10-28 LAB — TSH: TSH: 2.931 u[IU]/mL (ref 0.350–4.500)

## 2015-10-28 LAB — LIPASE, BLOOD: Lipase: 33 U/L (ref 11–51)

## 2015-10-28 MED ORDER — HEPARIN SODIUM (PORCINE) 5000 UNIT/ML IJ SOLN
5000.0000 [IU] | Freq: Three times a day (TID) | INTRAMUSCULAR | Status: DC
  Administered 2015-10-29 – 2015-10-31 (×8): 5000 [IU] via SUBCUTANEOUS
  Filled 2015-10-28 (×8): qty 1

## 2015-10-28 MED ORDER — SODIUM CHLORIDE 0.9 % IV BOLUS (SEPSIS)
1000.0000 mL | Freq: Once | INTRAVENOUS | Status: AC
  Administered 2015-10-28: 1000 mL via INTRAVENOUS

## 2015-10-28 MED ORDER — SODIUM CHLORIDE 0.9 % IV SOLN
INTRAVENOUS | Status: DC
  Administered 2015-10-29 – 2015-10-31 (×5): via INTRAVENOUS

## 2015-10-28 MED ORDER — FAMOTIDINE IN NACL 20-0.9 MG/50ML-% IV SOLN
20.0000 mg | INTRAVENOUS | Status: DC
  Administered 2015-10-29 (×2): 20 mg via INTRAVENOUS
  Filled 2015-10-28 (×3): qty 50

## 2015-10-28 MED ORDER — NOREPINEPHRINE BITARTRATE 1 MG/ML IV SOLN: 0.0000 ug/min | INTRAVENOUS | Status: DC

## 2015-10-28 MED ORDER — NOREPINEPHRINE 4 MG/250ML-% IV SOLN
INTRAVENOUS | Status: AC
  Administered 2015-10-28: 4 mg via INTRAVENOUS
  Filled 2015-10-28: qty 250

## 2015-10-28 MED ORDER — SODIUM CHLORIDE 0.9 % IV BOLUS (SEPSIS)
1000.0000 mL | Freq: Once | INTRAVENOUS | Status: AC
Start: 2015-10-28 — End: 2015-10-28
  Administered 2015-10-28: 1000 mL via INTRAVENOUS

## 2015-10-28 MED ORDER — SODIUM CHLORIDE 0.9 % IV SOLN: 250.0000 mL | INTRAVENOUS | Status: DC | PRN

## 2015-10-28 MED ORDER — DEXTROSE 5 % IV SOLN
1.0000 g | Freq: Once | INTRAVENOUS | Status: AC
  Administered 2015-10-28: 1 g via INTRAVENOUS
  Filled 2015-10-28: qty 10

## 2015-10-28 MED ORDER — NOREPINEPHRINE 4 MG/250ML-% IV SOLN
0.0000 ug/min | INTRAVENOUS | Status: DC
  Administered 2015-10-28: 4 mg via INTRAVENOUS
  Administered 2015-10-29: 1 ug/min via INTRAVENOUS
  Filled 2015-10-28: qty 250

## 2015-10-28 MED ORDER — HYDROCORTISONE NA SUCCINATE PF 100 MG IJ SOLR
50.0000 mg | Freq: Once | INTRAMUSCULAR | Status: AC
  Administered 2015-10-28: 50 mg via INTRAVENOUS
  Filled 2015-10-28: qty 2

## 2015-10-28 NOTE — Progress Notes (Signed)
Pharmacy Antibiotic Note  Cristina [name] is a 55 y.o. female admitted on 10/28/2015 with UTI.  Pharmacy has been consulted for ceftriaxone dosing.  Plan: Ceftriaxone 1 gm IV Q24H  Weight: 243 lb (110.2 kg)  Temp (24hrs), Avg:97.6 F (36.4 C), Min:97.6 F (36.4 C), Max:97.6 F (36.4 C)   Recent Labs Lab 10/28/15 1940 10/28/15 1941  WBC 11.8*  --   CREATININE 3.05*  --   LATICACIDVEN  --  1.5    Estimated Creatinine Clearance: 21.6 mL/min (by C-G formula based on SCr of 3.05 mg/dL).    Allergies  Allergen Reactions  . Shellfish-Derived Products Anaphylaxis and Shortness Of Breath    Stated by Patient  . Codeine   . Thorazine [Chlorpromazine] Swelling  . Tylenol With Codeine #3 [Acetaminophen-Codeine] Other (See Comments)    Hallucinations    Thank you for allowing pharmacy to be a part of this patient's care.  Laural Benes, Pharm.D., BCPS Clinical Pharmacist 10/28/2015 11:44 PM

## 2015-10-28 NOTE — ED Notes (Signed)
Pt returned from CT °

## 2015-10-28 NOTE — H&P (Signed)
PULMONARY / CRITICAL CARE MEDICINE   Name: Cristina [name] MRN: [id number] DOB: September 18, 1960    ADMISSION DATE:  10/28/2015 CONSULTATION DATE: 10/28/15  REFERRING MD:  Dr.Gayle  CHIEF COMPLAINT:  Altered Mental Status  HISTORY OF PRESENT ILLNESS:   Cristina [name] is a 55 years old female with past medical history significant for COPD, CHF degenerative disc disease GERD, sleep apnea,schizoaffective disorder.Her daughter reports that she had not heard from her mother today though her mother usually calls her very frequently. Her landlord went to check on her and found her with decreased level of consciousness. Her landlord called 911.  EMS found the patient to be hypotensive with SBP in 80s.. Daughter also stated that her mother fell yesterday and today(10/28/15).  In the ED her CT of head was negative for any acute findings. She seems to be septic as indicated by her urinalysis. She also has some diarrhea as per the daughter. She denies any fever, nausea, vomiting or abdominal pain at this time.Therefore sepsis protocol was initiated and PCCM was called to admit the patient.  PAST MEDICAL HISTORY :  She  has a past medical history of Abdominal hernia; Arthritis; Asthma; Bladder dysfunction; Carpal tunnel syndrome; CHF (congestive heart failure) (HCC); COPD (chronic obstructive pulmonary disease) ([place]); DDD (degenerative disc disease), lumbar; Diabetes mellitus without complication ([place]); Dyspnea; Edema; Fatty tumor; GERD (gastroesophageal reflux disease); H/O colonoscopy; H/O tubal ligation; History of cholecystectomy; HLD (hyperlipidemia); Hypertension; Muscle cramps; Neuropathy ([place]); Obesity; Recurrent boils; Schizoaffective disorder ([place]); Sleep apnea; Stroke [facility]); UTI (lower urinary tract infection); and Vaginitis.  PAST SURGICAL HISTORY: She  has a past surgical history that includes Cholecystectomy; tubal ligation; Cardiac catheterization (Left, 09/24/2014); Colonoscopy with propofol (N/A, 08/12/2015);  and Esophagogastroduodenoscopy (egd) with propofol (N/A, 08/12/2015).  Allergies  Allergen Reactions  . Shellfish-Derived Products Anaphylaxis and Shortness Of Breath    Stated by Patient  . Codeine   . Thorazine [Chlorpromazine] Swelling  . Tylenol With Codeine #3 [Acetaminophen-Codeine] Other (See Comments)    Hallucinations    No current facility-administered medications on file prior to encounter.    Current Outpatient Prescriptions on File Prior to Encounter  Medication Sig  . aspirin EC 81 MG tablet Take 81 mg by mouth daily.  Marland Kitchen atorvastatin (LIPITOR) 20 MG tablet Take 20 mg by mouth at bedtime.  . cetirizine (ZYRTEC) 10 MG tablet Take 10 mg by mouth daily as needed for allergies.  . clonazePAM (KLONOPIN) 0.5 MG tablet Take 0.5 mg by mouth 2 (two) times daily.  . cyclobenzaprine (FLEXERIL) 10 MG tablet Take 1 tablet (10 mg total) by mouth 3 (three) times daily.  . diclofenac sodium (VOLTAREN) 1 % GEL Apply 2 g topically 4 (four) times daily as needed (for pain).  Marland Kitchen esomeprazole (NEXIUM) 40 MG capsule Take 40 mg by mouth daily.   . Fluticasone-Salmeterol (ADVAIR) 250-50 MCG/DOSE AEPB Inhale 1 puff into the lungs 2 (two) times daily.  . furosemide (LASIX) 40 MG tablet Take 1 tablet (40 mg total) by mouth daily.  . haloperidol (HALDOL) 5 MG tablet Take 1 tablet (5 mg total) by mouth at bedtime.  . haloperidol decanoate (HALDOL DECANOATE) 100 MG/ML injection Inject 1 mL (100 mg total) into the muscle every 30 (thirty) days.  . insulin detemir (LEVEMIR) 100 UNIT/ML injection Inject 50-52 Units into the skin 2 (two) times daily. Pt uses 50 units in the morning and 52 units at bedtime.  . lidocaine (LIDODERM) 5 % Place 1 patch onto the skin every 12 (twelve) hours.  Remove & Discard patch within 12 hours or as directed by MD  . linagliptin (TRADJENTA) 5 MG TABS tablet Take 5 mg by mouth daily.  . meloxicam (MOBIC) 7.5 MG tablet Take 1 tablet (7.5 mg total) by mouth 2 (two) times daily.  .  metoprolol (LOPRESSOR) 50 MG tablet Take 50 mg by mouth 2 (two) times daily.  . mirtazapine (REMERON) 45 MG tablet Take 45 mg by mouth at bedtime.  . pregabalin (LYRICA) 100 MG capsule Take 100 mg by mouth 3 (three) times daily as needed (for nerve pain).  . quinapril (ACCUPRIL) 20 MG tablet Take 20 mg by mouth daily.  Marland Kitchen tiotropium (SPIRIVA) 18 MCG inhalation capsule Place 18 mcg into inhaler and inhale daily.  Marland Kitchen albuterol (PROVENTIL HFA;VENTOLIN HFA) 108 (90 BASE) MCG/ACT inhaler Inhale 2 puffs into the lungs every 6 (six) hours as needed for wheezing or shortness of breath.  . traZODone (DESYREL) 100 MG tablet Take 1 tablet (100 mg total) by mouth at bedtime as needed for sleep. (Patient not taking: Reported on 10/28/2015)    FAMILY HISTORY:  Her indicated that the status of her mother is unknown.    SOCIAL HISTORY: She  reports that she has quit smoking. She has never used smokeless tobacco. She reports that she does not drink alcohol or use drugs.  REVIEW OF SYSTEMS:   Unable to obtain due to AMS  SUBJECTIVE:  Unable to obtain due to [place]: BP 106/65   Pulse 85   Temp 97.6 F (36.4 C) (Oral)   Resp 15   Wt 110.2 kg (243 lb)   SpO2 95%   BMI 52.58 kg/m     INTAKE / OUTPUT: No intake/output data recorded.  PHYSICAL EXAMINATION: General:  Morbidly obese white female, found on stretcher Neuro:  Opens eyes to voice, but confused HEENT: Atraumatic, normocephalic, no discharge, no JVD appreciated Cardiovascular:  S1S2,RRR, No MRG noted Lungs: symmetrical chest expansion, no wheezes, crackles, rhonchi noted Abdomen:  Obese,soft, nontender Musculoskeletal:  No inflammation/deformity noted Skin: grossly intact  LABS:  BMET  Recent Labs Lab 10/28/15 1940  NA 136  K 3.8  CL 102  CO2 19*  BUN 78*  CREATININE 3.05*  GLUCOSE 159*    Electrolytes  Recent Labs Lab 10/28/15 1940  CALCIUM 8.9    CBC  Recent Labs Lab 10/28/15 1940  WBC 11.8*  HGB  11.2*  HCT 33.9*  PLT 208    Coag's No results for input(s): APTT, INR in the last 168 hours.  Sepsis Markers  Recent Labs Lab 10/28/15 1941  LATICACIDVEN 1.5    ABG No results for input(s): PHART, PCO2ART, PO2ART in the last 168 hours.  Liver Enzymes  Recent Labs Lab 10/28/15 1940  AST 125*  ALT 96*  ALKPHOS 112  BILITOT 0.6  ALBUMIN 2.6*    Cardiac Enzymes  Recent Labs Lab 10/28/15 1940  TROPONINI <0.03    Glucose  Recent Labs Lab 10/28/15 2112  GLUCAP 125*    Imaging Ct Head Wo Contrast  Result Date: 10/28/2015 CLINICAL DATA:  Fall today, progressive weakness and somnolence, pt not responsive, hypotensive, hx of cva EXAM: CT HEAD WITHOUT CONTRAST CT CERVICAL SPINE WITHOUT CONTRAST TECHNIQUE: Multidetector CT imaging of the head and cervical spine was performed following the standard protocol without intravenous contrast. Multiplanar CT image reconstructions of the cervical spine were also generated. COMPARISON:  10/15/2013 FINDINGS: CT HEAD FINDINGS Brain: No evidence of acute infarction, hemorrhage, hydrocephalus, extra-axial collection or mass  lesion/mass effect. Vascular: No hyperdense vessel or unexpected calcification. Skull: Normal. Negative for fracture or focal lesion. Sinuses/Orbits: No acute finding. CT CERVICAL SPINE FINDINGS Motion degraded images. Alignment: Mild straightening of cervical lordosis. Skull base and vertebrae: No acute fracture. No primary bone lesion or focal pathologic process. Soft tissues and spinal canal: No prevertebral fluid or swelling. No visible canal hematoma. Disc levels:  Mild degenerative disc changes, greatest at C5-6. Upper chest: No significant abnormality IMPRESSION: 1. Normal brain.  No acute finding. 2. Motion degraded cervical spine images. Negative for acute fracture. Electronically Signed   By: Andreas Newport M.D.   On: 10/28/2015 21:15   Ct Cervical Spine Wo Contrast  Result Date: 10/28/2015 CLINICAL DATA:   Fall today, progressive weakness and somnolence, pt not responsive, hypotensive, hx of cva EXAM: CT HEAD WITHOUT CONTRAST CT CERVICAL SPINE WITHOUT CONTRAST TECHNIQUE: Multidetector CT imaging of the head and cervical spine was performed following the standard protocol without intravenous contrast. Multiplanar CT image reconstructions of the cervical spine were also generated. COMPARISON:  10/15/2013 FINDINGS: CT HEAD FINDINGS Brain: No evidence of acute infarction, hemorrhage, hydrocephalus, extra-axial collection or mass lesion/mass effect. Vascular: No hyperdense vessel or unexpected calcification. Skull: Normal. Negative for fracture or focal lesion. Sinuses/Orbits: No acute finding. CT CERVICAL SPINE FINDINGS Motion degraded images. Alignment: Mild straightening of cervical lordosis. Skull base and vertebrae: No acute fracture. No primary bone lesion or focal pathologic process. Soft tissues and spinal canal: No prevertebral fluid or swelling. No visible canal hematoma. Disc levels:  Mild degenerative disc changes, greatest at C5-6. Upper chest: No significant abnormality IMPRESSION: 1. Normal brain.  No acute finding. 2. Motion degraded cervical spine images. Negative for acute fracture. Electronically Signed   By: Andreas Newport M.D.   On: 10/28/2015 21:15   Dg Chest Portable 1 View  Result Date: 10/28/2015 CLINICAL DATA:  Central line placement. EXAM: PORTABLE CHEST 1 VIEW COMPARISON:  Earlier this day at 2009 hour FINDINGS: Tip of the left internal jugular central venous catheter in the region of the distal brachiocephalic vein. No pneumothorax. Lower lung volumes from prior exam accentuating cardiomegaly and vascular congestion. No developing confluent airspace disease or pleural effusion. IMPRESSION: Tip of the left internal jugular central venous catheter in the region of the distal brachiocephalic vein. No pneumothorax. Electronically Signed   By: Jeb Levering M.D.   On: 10/28/2015 23:11    Dg Chest Portable 1 View  Result Date: 10/28/2015 CLINICAL DATA:  Fatigue and hypotension, diarrhea and weakness for week. History of COPD, hypertension. EXAM: PORTABLE CHEST 1 VIEW COMPARISON:  Chest radiograph September 23, 2015 FINDINGS: Similar cardiomegaly. Mediastinal silhouette is nonsuspicious. Pulmonary vascular congestion without pleural effusion or focal consolidation. No pneumothorax. Large body habitus. Osseous structures are nonsuspicious. IMPRESSION: Stable cardiomegaly and vascular congestion. Electronically Signed   By: Elon Alas M.D.   On: 10/28/2015 20:35     STUDIES:  9/11 CT Head>> negative   CULTURES: 9/11 urine culture>>  ANTIBIOTICS: 9/11 ceftriaxone>> 9/11 9/11 Ciprofloxacin>> 9/11 flagyl>>  SIGNIFICANT EVENTS: 9/11>> Patient admitted to the ICU due to sepsis possibly related to UTI, On levophed.  LINES/TUBES: 9/11 left IJ>>  DISCUSSION: 55 Year old female with diarrhea,UTI  And AMS, Possibly in septic shock requiring pressors.  ASSESSMENT / PLAN:   INFECTIOUS A:   Septic shock possibly UTI C-diff positive 06/20/15 P: Received crystalloid bolus in ER N/S@75  ml/hr   Stool for c-iff testing Follow urine culture Cipro/flagyl Monitor fever curve Follow C  PULMONARY A: Hx of COPD Hx of sleep apnea P:   Continue to support with O2 to keep sats >94% Continue [place] /spiriva  CARDIOVASCULAR A:  Shock possibly related to UTI Hx of Hypertension/hyperlipidemia Hx of CHF  P:  Continuous Telemetry Levo gtt Keep MAP goals>65 Hold metoprolol Continue Lipitor Continue aspirin  RENAL A:   AKI r/t severe UTI Hx of incomplete bladder emptying P:   Continue ciprofloxacin Follow urine culture Monitor BMET closely Replace electrolytes per ICU protocol  GASTROINTESTINAL A:   Diarrhea Hx of C-diff(06/20/15) Hx of GERD P:   Check stool for C-diff Enteric precautions for now. Continue famotidine  HEMATOLOGIC A:   No active  issues P:  Heparin for DVT prophylaxis Transfuse if HgB<7 BC  ENDOCRINE A:    Diabetes Meitus P:   Blood sugar checks q4hours SSI coverage  NEUROLOGIC A:   Acute encephalopathy related to sepsis Hx of Schizoaffective disorder P:   CT head negative 9/11 for any acute abnormality Minimize sedating drugs  FAMILY  - Updates:  Daughter was updated at the beside.     Bincy Varughese,AG-ACNP Pulmonary and [place]   10/28/2015, 11:54 PM  STAFF NOTE: I, Dr. Corrin Parker,  have personally reviewed patient's available data, including medical history, events of note, physical examination and test results as part of my evaluation. I have discussed with NP and other care providers such as pharmacist, RN and RRT.  In addition,  I personally evaluated patient and elicited key findings     A:+shock  P:  hypovolumia/sepsis Continue vasopressors, IV abx    The Rest per NP whose note is outlined above and that I agree with  I have personally reviewed/obtained a history, examined the patient, evaluated Pertinent laboratory and [employer]/imaging results, and  formulated the assessment and plan   The Patient requires high complexity decision making for assessment and support, frequent evaluation and titration of therapies, application of advanced monitoring technologies and extensive interpretation of multiple databases. Critical Care Time devoted to patient care services described in this note is 45 minutes.  This Critical care time does not reflrect procedure time or supervisory time of NP but could involve care discussion time Overall, patient is critically ill, prognosis is guarded.    Corrin Parker, M.D.  [facility]  Medical Director [place] Director [facility] Cardio-Pulmonary Department

## 2015-10-28 NOTE — ED Notes (Addendum)
Verified with Dr Edd Fabian to cont to use periph IV for med infusions until directed otherwise; levophed changed to CL site

## 2015-10-28 NOTE — ED Provider Notes (Signed)
[facility] Emergency Department Provider Note   ____________________________________________   First MD Initiated Contact with Patient 10/28/15 1937     (approximate)  I have reviewed the triage vital signs and the nursing notes.   HISTORY  Chief Complaint Fatigue and Hypotension  Caveat-history of present illness review of systems Limited due to the patient's altered mental status. Information is obtained partially from the patient, EMS as well as her daughter.  HPI Cristina [name] is a 55 y.o. female history of CHF, COPD, diabetes, schizoaffective disorder who presents for evaluation of altered mental status today. According to EMS, she had a fall earlier today. Her daughter reports that she has not heard much from her mother today though the mother usually calls her fairly frequently. A landlord went to check on the patient and found her with a decreased level of consciousness and called 911. According to EMS on their arrival she was hypotensive with systolic blood pressure in the 80s. Patient reports to me that she has had some diarrhea today. She denies any abdominal pain, chest pain or difficulty breathing.   Past Medical History:  Diagnosis Date  . Abdominal hernia   . Arthritis   . Asthma   . Bladder dysfunction   . Carpal tunnel syndrome   . CHF (congestive heart failure) ([place])   . COPD (chronic obstructive pulmonary disease) ([place])   . DDD (degenerative disc disease), lumbar   . Diabetes mellitus without complication ([place])   . Dyspnea   . Edema   . Fatty tumor   . GERD (gastroesophageal reflux disease)   . H/O colonoscopy   . H/O tubal ligation    hx btl  . History of cholecystectomy    hx of cholecystectomy  . HLD (hyperlipidemia)   . Hypertension   . Muscle cramps   . Neuropathy ([place])   . Obesity   . Recurrent boils   . Schizoaffective disorder ([place])   . Sleep apnea   . Stroke ([place])   . UTI (lower urinary tract infection)   .  Vaginitis     Patient Active Problem List   Diagnosis Date Noted  . COPD (chronic obstructive pulmonary disease) ([place]) 07/31/2015  . Aspirin overdose 07/30/2015  . Schizoaffective disorder, bipolar type ([place]) 12/05/2014  . HTN (hypertension) 12/05/2014  . Sleep apnea 12/05/2014  . Diabetes ([place]) 12/05/2014  . Incomplete bladder emptying 12/04/2014  . Recurrent UTI 12/04/2014  . Polyneuropathy ([place]) 03/20/2009  . Arthritis, degenerative 05/23/2007  . HLD (hyperlipidemia) 05/23/2007    Past Surgical History:  Procedure Laterality Date  . CARDIAC CATHETERIZATION Left 09/24/2014   Procedure: Left Heart Cath and Coronary Angiography;  Surgeon: Dionisio David, MD;  Location: [place] CV LAB;  Service: Cardiovascular;  Laterality: Left;  . CHOLECYSTECTOMY    . COLONOSCOPY WITH PROPOFOL N/A 08/12/2015   Procedure: COLONOSCOPY WITH PROPOFOL;  Surgeon: Lollie Sails, MD;  Location: [facility] ENDOSCOPY;  Service: Endoscopy;  Laterality: N/A;  . ESOPHAGOGASTRODUODENOSCOPY (EGD) WITH PROPOFOL N/A 08/12/2015   Procedure: ESOPHAGOGASTRODUODENOSCOPY (EGD) WITH PROPOFOL;  Surgeon: Lollie Sails, MD;  Location: [facility] ENDOSCOPY;  Service: Endoscopy;  Laterality: N/A;  . tubal ligation      Prior to Admission medications   Medication Sig Start Date End Date Taking? Authorizing Provider  aspirin EC 81 MG tablet Take 81 mg by mouth daily.   Yes Historical Provider, MD  atorvastatin (LIPITOR) 20 MG tablet Take 20 mg by mouth at bedtime.   Yes Historical Provider,  MD  cetirizine (ZYRTEC) 10 MG tablet Take 10 mg by mouth daily as needed for allergies.   Yes Historical Provider, MD  clonazePAM (KLONOPIN) 0.5 MG tablet Take 0.5 mg by mouth 2 (two) times daily.   Yes Historical Provider, MD  cyclobenzaprine (FLEXERIL) 10 MG tablet Take 1 tablet (10 mg total) by mouth 3 (three) times daily. 08/02/15  Yes Hildred Priest, MD  diclofenac sodium (VOLTAREN) 1 % GEL Apply 2 g topically 4 (four) times  daily as needed (for pain).   Yes Historical Provider, MD  esomeprazole (NEXIUM) 40 MG capsule Take 40 mg by mouth daily.    Yes Historical Provider, MD  Fluticasone-Salmeterol (ADVAIR) 250-50 MCG/DOSE AEPB Inhale 1 puff into the lungs 2 (two) times daily.   Yes Historical Provider, MD  furosemide (LASIX) 40 MG tablet Take 1 tablet (40 mg total) by mouth daily. 08/02/15  Yes Hildred Priest, MD  haloperidol (HALDOL) 5 MG tablet Take 1 tablet (5 mg total) by mouth at bedtime. 08/02/15  Yes Hildred Priest, MD  haloperidol decanoate (HALDOL DECANOATE) 100 MG/ML injection Inject 1 mL (100 mg total) into the muscle every 30 (thirty) days. 08/02/15  Yes Hildred Priest, MD  insulin detemir (LEVEMIR) 100 UNIT/ML injection Inject 50-52 Units into the skin 2 (two) times daily. Pt uses 50 units in the morning and 52 units at bedtime.   Yes Historical Provider, MD  lidocaine (LIDODERM) 5 % Place 1 patch onto the skin every 12 (twelve) hours. Remove & Discard patch within 12 hours or as directed by MD   Yes Historical Provider, MD  linagliptin (TRADJENTA) 5 MG TABS tablet Take 5 mg by mouth daily.   Yes Historical Provider, MD  meloxicam (MOBIC) 7.5 MG tablet Take 1 tablet (7.5 mg total) by mouth 2 (two) times daily. 08/02/15  Yes Hildred Priest, MD  metoprolol (LOPRESSOR) 50 MG tablet Take 50 mg by mouth 2 (two) times daily.   Yes Historical Provider, MD  mirtazapine (REMERON) 45 MG tablet Take 45 mg by mouth at bedtime.   Yes Historical Provider, MD  pantoprazole (PROTONIX) 40 MG tablet Take 40 mg by mouth 2 (two) times daily.   Yes Historical Provider, MD  potassium chloride (K-DUR) 10 MEQ tablet Take 10 mEq by mouth daily.   Yes Historical Provider, MD  pregabalin (LYRICA) 100 MG capsule Take 100 mg by mouth 3 (three) times daily as needed (for nerve pain).   Yes Historical Provider, MD  quinapril (ACCUPRIL) 20 MG tablet Take 20 mg by mouth daily.   Yes Historical  Provider, MD  tiotropium (SPIRIVA) 18 MCG inhalation capsule Place 18 mcg into inhaler and inhale daily.   Yes Historical Provider, MD  venlafaxine XR (EFFEXOR-XR) 150 MG 24 hr capsule Take 150 mg by mouth daily.   Yes Historical Provider, MD  albuterol (PROVENTIL HFA;VENTOLIN HFA) 108 (90 BASE) MCG/ACT inhaler Inhale 2 puffs into the lungs every 6 (six) hours as needed for wheezing or shortness of breath.    Historical Provider, MD  traZODone (DESYREL) 100 MG tablet Take 1 tablet (100 mg total) by mouth at bedtime as needed for sleep. Patient not taking: Reported on 10/28/2015 12/07/14   Clovis Fredrickson, MD    Allergies Shellfish-derived products; Codeine; Thorazine [chlorpromazine]; and Tylenol with codeine #3 [acetaminophen-codeine]  Family History  Problem Relation Age of Onset  . CAD Mother     Social History Social History  Substance Use Topics  . Smoking status: Former Research scientist (life sciences)  . Smokeless tobacco:  Never Used  . Alcohol use No    Review of Systems  Caveat-history of present illness review of systems Limited due to the patient's altered mental status. Information is obtained partially from the patient, EMS as well as her daughter.  ____________________________________________   PHYSICAL EXAM:  VITAL SIGNS: ED Triage Vitals  Enc Vitals Group     BP 10/28/15 1945 (!) 88/50     Pulse Rate 10/28/15 1945 78     Resp 10/28/15 1945 17     Temp 10/28/15 1945 97.6 F (36.4 C)     Temp Source 10/28/15 1945 Oral     SpO2 10/28/15 1945 99 %     Weight 10/28/15 1954 243 lb (110.2 kg)     Height --      Head Circumference --      Peak Flow --      Pain Score 10/28/15 1954 0     Pain Loc --      Pain Edu? --      Excl. in [place]? --     Constitutional: Patient appears fatigued, but she opens with a light touch, answers most questions appropriately, follows commands. She is in no acute distress. Eyes: Conjunctivae are normal. PERRL. EOMI. Head: Atraumatic. Nose: No  congestion/rhinnorhea. Mouth/Throat: Mucous membranes are moist.  Oropharynx non-erythematous. Neck: No stridor.  No cervical spine tenderness to palpation. Cardiovascular: Normal rate, regular rhythm. Grossly normal heart sounds.  Good peripheral circulation. Respiratory: Normal respiratory effort.  No retractions. Lungs CTAB. Gastrointestinal: Soft and nontender. No distention.  No CVA tenderness. Genitourinary: deferred Musculoskeletal: No lower extremity tenderness nor edema.  No joint effusions. Neurologic:  Normal speech and language. Moves all extremities spontaneously and equally but has diffusely diminished strength in all extremities. Skin:  Skin is warm, dry and intact. No rash noted. Psychiatric: Mood and affect are normal. Speech and behavior are normal.  ____________________________________________   LABS (all labs ordered are listed, but only abnormal results are displayed)  Labs Reviewed  CBC WITH DIFFERENTIAL/PLATELET - Abnormal; Notable for the following:       Result Value   WBC 11.8 (*)    Hemoglobin 11.2 (*)    HCT 33.9 (*)    MCV 76.1 (*)    MCH 25.1 (*)    RDW 21.5 (*)    Neutro Abs 9.6 (*)    Lymphs Abs 0.9 (*)    Monocytes Absolute 1.0 (*)    All other components within normal limits  COMPREHENSIVE METABOLIC PANEL - Abnormal; Notable for the following:    CO2 19 (*)    Glucose, Bld 159 (*)    BUN 78 (*)    Creatinine, Ser 3.05 (*)    Albumin 2.6 (*)    AST 125 (*)    ALT 96 (*)    GFR calc non Af Amer 16 (*)    GFR calc Af Amer 18 (*)    All other components within normal limits  URINALYSIS COMPLETEWITH MICROSCOPIC ([facility] ONLY) - Abnormal; Notable for the following:    Color, Urine AMBER (*)    APPearance CLOUDY (*)    Hgb urine dipstick 1+ (*)    Protein, ur 30 (*)    Leukocytes, UA 3+ (*)    Bacteria, UA FEW (*)    Squamous Epithelial / LPF 0-5 (*)    All other components within normal limits  GLUCOSE, CAPILLARY - Abnormal; Notable for the  following:    Glucose-Capillary 125 (*)    All  other components within normal limits  CULTURE, BLOOD (ROUTINE X 2)  CULTURE, BLOOD (ROUTINE X 2)  URINE CULTURE  LIPASE, BLOOD  TROPONIN I  TSH  T4, FREE  LACTIC ACID, PLASMA  AMMONIA  LACTIC ACID, PLASMA  TYPE AND SCREEN   ____________________________________________  EKG  ED ECG REPORT I, Joanne Gavel, the attending physician, personally viewed and interpreted this ECG.   Date: 10/28/2015  EKG Time: 19:38  Rate: 79  Rhythm: normal sinus rhythm  Axis: normal  Intervals:right bundle branch block  ST&T Change: No acute ST elevation or acute ST depression. Q waves in lead 3 and aVF.  ____________________________________________  RADIOLOGY  CXR   IMPRESSION:  Stable cardiomegaly and vascular congestion.      CT head and c-spine IMPRESSION:  1. Normal brain. No acute finding.  2. Motion degraded cervical spine images. Negative for acute  fracture.    CXR IMPRESSION:  Tip of the left internal jugular central venous catheter in the  region of the distal brachiocephalic vein. No pneumothorax    ____________________________________________   PROCEDURES  Procedure(s) performed:   CENTRAL LINE Performed by: Loura Pardon A Consent: The procedure was performed in an emergent situation. Required items: required blood products, implants, devices, and special equipment available Patient identity confirmed: arm band and provided demographic data Time out: Immediately prior to procedure a "time out" was called to verify the correct patient, procedure, equipment, support staff and site/side marked as required. Indications: vascular access Anesthesia: local infiltration Local anesthetic: lidocaine 1% with epinephrine Anesthetic total: 3 ml Patient sedated: no Preparation: skin prepped with 2% chlorhexidine Skin prep agent dried: skin prep agent completely dried prior to procedure Sterile barriers: all five  maximum sterile barriers used - cap, mask, sterile gown, sterile gloves, and large sterile sheet Hand hygiene: hand hygiene performed prior to central venous catheter insertion  Location details: left IJ  Catheter type: triple lumen Catheter size: 8 Fr Pre-procedure: landmarks identified Ultrasound guidance: yes Successful placement: yes Post-procedure: line sutured and dressing applied Assessment: blood return through all parts, free fluid flow, placement verified by x-ray and no pneumothorax on x-ray Patient tolerance: Patient tolerated the procedure well with no immediate complications.  Procedures  Critical Care performed: Yes, see critical care note(s)   CRITICAL CARE Performed by: Loura Pardon A   Total critical care time: 40 minutes  Critical care time was exclusive of separately billable procedures and treating other patients.  Critical care was necessary to treat or prevent imminent or life-threatening deterioration.  Critical care was time spent personally by me on the following activities: development of treatment plan with patient and/or surrogate as well as nursing, discussions with consultants, evaluation of patient's response to treatment, examination of patient, obtaining history from patient or surrogate, ordering and performing treatments and interventions, ordering and review of laboratory studies, ordering and review of radiographic studies, pulse oximetry and re-evaluation of patient's condition.  ____________________________________________   INITIAL IMPRESSION / ASSESSMENT AND PLAN / ED COURSE  Pertinent labs & imaging results that were available during my care of the patient were reviewed by me and considered in my medical decision making (see chart for details).  Cristina [name] is a 55 y.o. female history of CHF, COPD, diabetes, schizoaffective disorder who presents for evaluation of altered mental status today. On exam, she appears pale but nontoxic,  fatigued but answers questions appropriately. She is attentive with initial blood pressure of 88/50, we'll begin aggressive IV fluid resuscitation. The remainder of her  vital signs are stable, she is afebrile, she appears to move all of her extremities spontaneously and equally. Plan for screening labs, chest x-ray, blood cultures, urinalysis as well as CT head and C-spine given fall. Anticipate admission.  ----------------------------------------- 11:01 PM on 10/28/2015 ----------------------------------------- I reviewed the patient's labs which show mild leukocytosis at 11.8. CMP shows acute renal failure with a creatinine of 3.05, baseline is close to 1. Urinalysis is concerning for urinary tract infection. Patient received ceftriaxone. Concern for sepsis secondary to UTI. After 3 L of normal saline, patient has had refractory hypotension, unable to achieve maps greater than 60. She also received stress dose steroids. Continuous levophed infusion was initiated for blood pressure support. Central line placed by me. Initial attempt was for a right IJ central line however I was unable to thread the wire due to resistance so that was aborted. Patient was prepped and draped for a left IJ central line, and there was some initial resistance but then the catheter threaded well, dark venous appearing blood flow was noted from all ports. Chest x-ray shows that the line is likely in the right brachiocephalic vein as discussed with reading radiologist Dr. Jeb Levering. Patient with significant improvement of her blood pressure at this time, currently 115/68. Discussed with Dr. Halford Chessman of intensive care for admission.  Clinical Course     ____________________________________________   FINAL CLINICAL IMPRESSION(S) / ED DIAGNOSES  Final diagnoses:  Hypotension, unspecified hypotension type  UTI (lower urinary tract infection)  Altered mental status, unspecified altered mental status type  Acute renal  failure, unspecified acute renal failure type ([place])      NEW MEDICATIONS STARTED DURING THIS VISIT:  New Prescriptions   No medications on file     Note:  This document was prepared using Dragon voice recognition software and may include unintentional dictation errors.    Joanne Gavel, MD 10/28/15 [id number]

## 2015-10-28 NOTE — ED Triage Notes (Signed)
Pt bib EMS from home w/ c/o fatifue and hypotension.  Pt c/o leg weakness sts they "give out".  Pt alert, oriented to self and situation.  Disoriented to time.Per EMS, pt normally A/O x 4.  Pt sts that she recently has had diarrhea, but weakness has been for weeks.

## 2015-10-29 DIAGNOSIS — R6521 Severe sepsis with septic shock: Secondary | ICD-10-CM

## 2015-10-29 DIAGNOSIS — A419 Sepsis, unspecified organism: Secondary | ICD-10-CM

## 2015-10-29 LAB — GLUCOSE, CAPILLARY
Glucose-Capillary: 121 mg/dL — ABNORMAL HIGH (ref 65–99)
Glucose-Capillary: 123 mg/dL — ABNORMAL HIGH (ref 65–99)
Glucose-Capillary: 140 mg/dL — ABNORMAL HIGH (ref 65–99)
Glucose-Capillary: 161 mg/dL — ABNORMAL HIGH (ref 65–99)
Glucose-Capillary: 186 mg/dL — ABNORMAL HIGH (ref 65–99)
Glucose-Capillary: 209 mg/dL — ABNORMAL HIGH (ref 65–99)
Glucose-Capillary: 218 mg/dL — ABNORMAL HIGH (ref 65–99)

## 2015-10-29 LAB — BASIC METABOLIC PANEL
Anion gap: 9 (ref 5–15)
BUN: 72 mg/dL — ABNORMAL HIGH (ref 6–20)
CO2: 22 mmol/L (ref 22–32)
Calcium: 8.1 mg/dL — ABNORMAL LOW (ref 8.9–10.3)
Chloride: 108 mmol/L (ref 101–111)
Creatinine, Ser: 2.12 mg/dL — ABNORMAL HIGH (ref 0.44–1.00)
GFR calc Af Amer: 29 mL/min — ABNORMAL LOW (ref >60)
GFR calc non Af Amer: 25 mL/min — ABNORMAL LOW (ref >60)
Glucose, Bld: 193 mg/dL — ABNORMAL HIGH (ref 65–99)
Potassium: 3.7 mmol/L (ref 3.5–5.1)
Sodium: 139 mmol/L (ref 135–145)

## 2015-10-29 LAB — CBC
HCT: 33 % — ABNORMAL LOW (ref 35.0–47.0)
Hemoglobin: 11 g/dL — ABNORMAL LOW (ref 12.0–16.0)
MCH: 25.3 pg — ABNORMAL LOW (ref 26.0–34.0)
MCHC: 33.4 g/dL (ref 32.0–36.0)
MCV: 75.7 fL — ABNORMAL LOW (ref 80.0–100.0)
Platelets: 222 10*3/uL (ref 150–440)
RBC: 4.36 MIL/uL (ref 3.80–5.20)
RDW: 21.2 % — ABNORMAL HIGH (ref 11.5–14.5)
WBC: 11.9 10*3/uL — ABNORMAL HIGH (ref 3.6–11.0)

## 2015-10-29 LAB — MAGNESIUM: Magnesium: 2.3 mg/dL (ref 1.7–2.4)

## 2015-10-29 LAB — PHOSPHORUS: Phosphorus: 5.8 mg/dL — ABNORMAL HIGH (ref 2.5–4.6)

## 2015-10-29 LAB — MRSA PCR SCREENING: MRSA by PCR: NEGATIVE

## 2015-10-29 MED ORDER — INSULIN ASPART 100 UNIT/ML ~~LOC~~ SOLN: 2.0000 [IU] | SUBCUTANEOUS | Status: DC

## 2015-10-29 MED ORDER — SODIUM CHLORIDE 0.9 % IV BOLUS (SEPSIS)
1000.0000 mL | Freq: Once | INTRAVENOUS | Status: AC
  Administered 2015-10-29: 1000 mL via INTRAVENOUS

## 2015-10-29 MED ORDER — HALOPERIDOL 5 MG PO TABS
5.0000 mg | ORAL_TABLET | Freq: Every day | ORAL | Status: DC
  Administered 2015-10-29 – 2015-10-30 (×2): 5 mg via ORAL
  Filled 2015-10-29 (×2): qty 1
  Filled 2015-10-29: qty 10
  Filled 2015-10-29: qty 1

## 2015-10-29 MED ORDER — VENLAFAXINE HCL ER 75 MG PO CP24
150.0000 mg | ORAL_CAPSULE | Freq: Every day | ORAL | Status: DC
  Administered 2015-10-29 – 2015-10-30 (×2): 150 mg via ORAL
  Filled 2015-10-29 (×2): qty 2

## 2015-10-29 MED ORDER — DEXTROSE 5 % IV SOLN
1.0000 g | INTRAVENOUS | Status: DC
  Administered 2015-10-29: 1 g via INTRAVENOUS
  Filled 2015-10-29 (×2): qty 10

## 2015-10-29 MED ORDER — CIPROFLOXACIN IN D5W 400 MG/200ML IV SOLN
400.0000 mg | Freq: Every day | INTRAVENOUS | Status: DC
  Administered 2015-10-29: 400 mg via INTRAVENOUS
  Filled 2015-10-29 (×2): qty 200

## 2015-10-29 MED ORDER — SODIUM CHLORIDE 0.9% FLUSH
10.0000 mL | Freq: Two times a day (BID) | INTRAVENOUS | Status: DC
  Administered 2015-10-29: 10 mL
  Administered 2015-10-29: 20 mL
  Administered 2015-10-30: 10 mL
  Administered 2015-10-30: 20 mL
  Administered 2015-10-31: 10 mL

## 2015-10-29 MED ORDER — CIPROFLOXACIN IN D5W 400 MG/200ML IV SOLN
400.0000 mg | Freq: Three times a day (TID) | INTRAVENOUS | Status: DC
  Filled 2015-10-29 (×2): qty 200

## 2015-10-29 MED ORDER — METRONIDAZOLE IN NACL 5-0.79 MG/ML-% IV SOLN
500.0000 mg | Freq: Three times a day (TID) | INTRAVENOUS | Status: DC
  Administered 2015-10-29 (×2): 500 mg via INTRAVENOUS
  Filled 2015-10-29 (×4): qty 100

## 2015-10-29 MED ORDER — ORAL CARE MOUTH RINSE
15.0000 mL | Freq: Two times a day (BID) | OROMUCOSAL | Status: DC
  Administered 2015-10-29 – 2015-10-31 (×4): 15 mL via OROMUCOSAL

## 2015-10-29 MED ORDER — CLONAZEPAM 0.5 MG PO TABS
0.5000 mg | ORAL_TABLET | Freq: Two times a day (BID) | ORAL | Status: DC
  Administered 2015-10-29 – 2015-10-31 (×5): 0.5 mg via ORAL
  Filled 2015-10-29 (×5): qty 1

## 2015-10-29 MED ORDER — ATORVASTATIN CALCIUM 20 MG PO TABS
20.0000 mg | ORAL_TABLET | Freq: Every day | ORAL | Status: DC
  Administered 2015-10-29 – 2015-10-30 (×2): 20 mg via ORAL
  Filled 2015-10-29 (×2): qty 1

## 2015-10-29 MED ORDER — SODIUM CHLORIDE 0.9% FLUSH: 10.0000 mL | INTRAVENOUS | Status: DC | PRN

## 2015-10-29 MED ORDER — TIOTROPIUM BROMIDE MONOHYDRATE 18 MCG IN CAPS
18.0000 ug | ORAL_CAPSULE | Freq: Every day | RESPIRATORY_TRACT | Status: DC
  Administered 2015-10-29 – 2015-10-30 (×2): 18 ug via RESPIRATORY_TRACT
  Filled 2015-10-29: qty 5

## 2015-10-29 MED ORDER — INSULIN ASPART 100 UNIT/ML ~~LOC~~ SOLN
2.0000 [IU] | SUBCUTANEOUS | Status: DC
  Administered 2015-10-29: 2 [IU] via SUBCUTANEOUS
  Administered 2015-10-29: 6 [IU] via SUBCUTANEOUS
  Administered 2015-10-29: 2 [IU] via SUBCUTANEOUS
  Administered 2015-10-29: 4 [IU] via SUBCUTANEOUS
  Administered 2015-10-29: 2 [IU] via SUBCUTANEOUS
  Administered 2015-10-29: 4 [IU] via SUBCUTANEOUS
  Administered 2015-10-29: 6 [IU] via SUBCUTANEOUS
  Administered 2015-10-30 (×2): 2 [IU] via SUBCUTANEOUS
  Filled 2015-10-29: qty 2
  Filled 2015-10-29 (×2): qty 6
  Filled 2015-10-29: qty 4
  Filled 2015-10-29 (×4): qty 2
  Filled 2015-10-29: qty 4

## 2015-10-29 MED ORDER — MOMETASONE FURO-FORMOTEROL FUM 200-5 MCG/ACT IN AERO
2.0000 | INHALATION_SPRAY | Freq: Two times a day (BID) | RESPIRATORY_TRACT | Status: DC
  Administered 2015-10-29 – 2015-10-31 (×4): 2 via RESPIRATORY_TRACT
  Filled 2015-10-29 (×2): qty 8.8

## 2015-10-29 MED ORDER — ASPIRIN 81 MG PO CHEW
81.0000 mg | CHEWABLE_TABLET | Freq: Every day | ORAL | Status: DC
  Administered 2015-10-29 – 2015-10-31 (×3): 81 mg via ORAL
  Filled 2015-10-29 (×3): qty 1

## 2015-10-29 MED ORDER — MIRTAZAPINE 15 MG PO TABS
45.0000 mg | ORAL_TABLET | Freq: Every day | ORAL | Status: DC
  Administered 2015-10-29 – 2015-10-30 (×2): 45 mg via ORAL
  Filled 2015-10-29 (×2): qty 3

## 2015-10-29 NOTE — Plan of Care (Signed)
Problem: Pain Managment: Goal: General experience of comfort will improve Outcome: Progressing Patient has not exhibited any signs of pain this shift.  Problem: Fluid Volume: Goal: Ability to maintain a balanced intake and output will improve Outcome: Progressing Patient able to take in oral fluids and receiving IV fluids to maintain hydration.  Problem: Bowel/Gastric: Goal: Will not experience complications related to bowel motility Outcome: Progressing Patient has not experienced any more episodes of diarrhea this shift.

## 2015-10-29 NOTE — ED Notes (Signed)
Report called to [facility], CCU

## 2015-10-29 NOTE — Progress Notes (Signed)
Chaplain rounded the unit to provide a compassionate presence and spiritual support to the patient. On the second visit to the patient's room the patient appeared to be sleeping. The first visit at about 8:50 a.m. gave way to the nurse providing medical attention. Minerva Fester [phone]

## 2015-10-29 NOTE — ED Notes (Signed)
Pt sleeping soundly, repos in bed for comfort; daughter at bedside

## 2015-10-29 NOTE — Progress Notes (Signed)
Pharmacy Antibiotic Follow-up Note  Cristina [name] is a 55 y.o. year-old female admitted on 10/28/2015.  Patient is currently taking ciprofloxacin 400 mg and metronidazole 500 mg due to prior cdiff infection (06/20/15). Patient received ceftriaxone 1 g on 9/11 x 1.  Assessment/Plan: After discussion with Dr. Mortimer Fries, will narrow antibiotics to ceftriaxone 1 g q 24 to be reinitiated 9/12 at 1800. Will monitor patient for signs and symptoms of cdiff infection.  Temp (24hrs), Avg:97.9 F (36.6 C), Min:97.6 F (36.4 C), Max:98.1 F (36.7 C)   Recent Labs Lab 10/28/15 1940 10/29/15 0602  WBC 11.8* 11.9*    Recent Labs Lab 10/28/15 1940 10/29/15 0602  CREATININE 3.05* 2.12*   Estimated Creatinine Clearance: 33 mL/min (by C-G formula based on SCr of 2.12 mg/dL).    Allergies  Allergen Reactions  . Shellfish-Derived Products Anaphylaxis and Shortness Of Breath    Stated by Patient  . Codeine   . Thorazine [Chlorpromazine] Swelling  . Tylenol With Codeine #3 [Acetaminophen-Codeine] Other (See Comments)    Hallucinations    Antimicrobials this admission: Ciprofloxacin 9/12 >> 9/12 Metronidazole 9/12 >> 9/12 Ceftriaxone 9/11 >>  Levels/dose changes this admission: Discontinued metronidazole and ciprofloxacin  Microbiology results: 9/11 BCx: Pending 9/11 UCx: Sent  9/12 MRSA PCR: Negative 9/12 Cdiff: Sent  Thank you for allowing pharmacy to be a part of this patient's care.  Darrow Bussing, PharmD Pharmacy Resident 10/29/2015 11:03 AM

## 2015-10-29 NOTE — Care Management Note (Signed)
Patient open case with Social services / Social worker is [employer]. Human services center, [address] [place], [place] [address]. Phone number [phone] / FAX: [phone]

## 2015-10-30 LAB — GLUCOSE, CAPILLARY
Glucose-Capillary: 135 mg/dL — ABNORMAL HIGH (ref 65–99)
Glucose-Capillary: 150 mg/dL — ABNORMAL HIGH (ref 65–99)
Glucose-Capillary: 169 mg/dL — ABNORMAL HIGH (ref 65–99)
Glucose-Capillary: 139 mg/dL — ABNORMAL HIGH (ref 65–99)
Glucose-Capillary: 158 mg/dL — ABNORMAL HIGH (ref 65–99)

## 2015-10-30 LAB — URINE CULTURE: Culture: NO GROWTH

## 2015-10-30 MED ORDER — INSULIN ASPART 100 UNIT/ML ~~LOC~~ SOLN
0.0000 [IU] | Freq: Every day | SUBCUTANEOUS | Status: DC
Start: 2015-10-30 — End: 2015-10-31

## 2015-10-30 MED ORDER — FAMOTIDINE 20 MG PO TABS
20.0000 mg | ORAL_TABLET | Freq: Every day | ORAL | Status: DC
  Administered 2015-10-30 – 2015-10-31 (×2): 20 mg via ORAL
  Filled 2015-10-30 (×2): qty 1

## 2015-10-30 MED ORDER — INSULIN ASPART 100 UNIT/ML ~~LOC~~ SOLN
0.0000 [IU] | Freq: Three times a day (TID) | SUBCUTANEOUS | Status: DC
Start: 2015-10-30 — End: 2015-10-31
  Administered 2015-10-30: 1 [IU] via SUBCUTANEOUS
  Administered 2015-10-31: 2 [IU] via SUBCUTANEOUS
  Filled 2015-10-30: qty 2
  Filled 2015-10-30: qty 1

## 2015-10-30 NOTE — Progress Notes (Signed)
Pharmacy Antibiotic Follow-up Note  Cristina [name] is a 55 y.o. year-old female admitted on 10/28/2015.  The patient is currently on day 3 of ceftriaxone 1 g q 24h for UTI.  Assessment/Plan: Patient's urine culture revealed no growth. Per MD, ceftriaxone has been discontinued.  Temp (24hrs), Avg:98.3 F (36.8 C), Min:98.1 F (36.7 C), Max:98.7 F (37.1 C)   Recent Labs Lab 10/28/15 1940 10/29/15 0602  WBC 11.8* 11.9*    Recent Labs Lab 10/28/15 1940 10/29/15 0602  CREATININE 3.05* 2.12*   Estimated Creatinine Clearance: 33.4 mL/min (by C-G formula based on SCr of 2.12 mg/dL (H)).    Allergies  Allergen Reactions  . Shellfish-Derived Products Anaphylaxis and Shortness Of Breath    Stated by Patient  . Codeine   . Thorazine [Chlorpromazine] Swelling  . Tylenol With Codeine #3 [Acetaminophen-Codeine] Other (See Comments)    Hallucinations    Antimicrobials this admission: Ciprofloxacin 9/12 >> 9/12 Metronidazole 9/12 >> 9/12 Ceftriaxone 9/11 >> 9/13  Levels/dose changes this admission: Ceftriaxone has been discontinued  Microbiology results: 09/11 BCx: Pending 9/11 UCx: No growth  9/12 MRSA PCR: Negative 9/11 Cdiff PCR: Canceled  Thank you for allowing pharmacy to be a part of this patient's care.  Darrow Bussing, PharmD Pharmacy Resident 10/30/2015 10:50 AM

## 2015-10-30 NOTE — NC FL2 (Signed)
[place] LEVEL OF CARE SCREENING TOOL     IDENTIFICATION  Patient Name: Cristina [name] Birthdate: 1960-11-04 Sex: female Admission Date (Current Location): 10/28/2015  [place] and [place] Number:  Selena Lesser [id number] [place] and Address:  [facility], [address], [place], [place] [address]      Provider Number: [id number]  Attending Physician Name and Address:  Flora Lipps, MD  Relative Name and Phone Number:       Current Level of Care: Hospital Recommended Level of Care: [place] Prior Approval Number:    Date Approved/Denied:   PASRR Number:   [id number]  Discharge Plan: SNF    Current Diagnoses: Patient Active Problem List   Diagnosis Date Noted  . Septic shock ([place]) 10/28/2015  . COPD (chronic obstructive pulmonary disease) ([place]) 07/31/2015  . Aspirin overdose 07/30/2015  . Schizoaffective disorder, bipolar type ([place]) 12/05/2014  . HTN (hypertension) 12/05/2014  . Sleep apnea 12/05/2014  . Diabetes ([place]) 12/05/2014  . Incomplete bladder emptying 12/04/2014  . Recurrent UTI 12/04/2014  . Polyneuropathy ([place]) 03/20/2009  . Arthritis, degenerative 05/23/2007  . HLD (hyperlipidemia) 05/23/2007    Orientation RESPIRATION BLADDER Height & Weight     Self, Place  Normal Incontinent Weight: 255 lb 4.7 oz (115.8 kg) Height:  4\' 11"  (149.9 cm)  BEHAVIORAL SYMPTOMS/MOOD NEUROLOGICAL BOWEL NUTRITION STATUS      Incontinent Diet  AMBULATORY STATUS COMMUNICATION OF NEEDS Skin   Extensive Assist   Normal                       Personal Care Assistance Level of Assistance  Bathing, Feeding, Dressing Bathing Assistance: Limited assistance Feeding assistance: Limited assistance Dressing Assistance: Limited assistance     Functional Limitations Info  Sight, Hearing, Speech Sight Info: Adequate Hearing Info: Adequate Speech Info: Adequate    SPECIAL CARE FACTORS FREQUENCY  PT (By licensed PT), OT  (By licensed OT)     PT Frequency: 5x OT Frequency: 5x            Contractures Contractures Info: Not present    Additional Factors Info  Code Status, Allergies, Psychotropic, Insulin Sliding Scale Code Status Info: Full Allergies Info: Shellfish-derived Products, Codeine, Thorazine Chlorpromazine, Tylenol With Codeine #3 Acetaminophen-codeine           Current Medications (10/30/2015):  This is the current hospital active medication list Current Facility-Administered Medications  Medication Dose Route Frequency Provider Last Rate Last Dose  . 0.9 %  sodium chloride infusion  250 mL Intravenous PRN Bincy S Varughese, NP      . 0.9 %  sodium chloride infusion   Intravenous Continuous Bincy S Varughese, NP 75 mL/hr at 10/30/15 0750    . aspirin chewable tablet 81 mg  81 mg Oral Daily Bincy S Varughese, NP   81 mg at 10/30/15 0920  . atorvastatin (LIPITOR) tablet 20 mg  20 mg Oral q1800 Bincy S Varughese, NP   20 mg at 10/29/15 1703  . clonazePAM (KLONOPIN) tablet 0.5 mg  0.5 mg Oral BID Flora Lipps, MD   0.5 mg at 10/30/15 0920  . famotidine (PEPCID) tablet 20 mg  20 mg Oral Daily Merilyn Baba, RPH   20 mg at 10/30/15 1217  . haloperidol (HALDOL) tablet 5 mg  5 mg Oral QHS Flora Lipps, MD   5 mg at 10/29/15 2133  . heparin injection 5,000 Units  5,000 Units Subcutaneous Q8H Bincy Jannetta Quint, NP  5,000 Units at 10/30/15 0528  . MEDLINE mouth rinse  15 mL Mouth Rinse BID Flora Lipps, MD   15 mL at 10/29/15 2135  . mirtazapine (REMERON) tablet 45 mg  45 mg Oral QHS Flora Lipps, MD   45 mg at 10/29/15 2135  . mometasone-formoterol (DULERA) 200-5 MCG/ACT inhaler 2 puff  2 puff Inhalation BID Holley Raring, NP   2 puff at 10/30/15 0738  . norepinephrine (LEVOPHED) 4mg  in D5W 248mL premix infusion  0-40 mcg/min Intravenous Titrated Joanne Gavel, MD   Stopped at 10/29/15 1704  . sodium chloride flush (NS) 0.9 % injection 10-40 mL  10-40 mL Intracatheter Q12H Flora Lipps, MD   20 mL  at 10/30/15 1000  . sodium chloride flush (NS) 0.9 % injection 10-40 mL  10-40 mL Intracatheter PRN Flora Lipps, MD      . tiotropium (SPIRIVA) inhalation capsule 18 mcg  18 mcg Inhalation Daily Holley Raring, NP   18 mcg at 10/30/15 0738  . venlafaxine XR (EFFEXOR-XR) 24 hr capsule 150 mg  150 mg Oral Daily Flora Lipps, MD   150 mg at 10/30/15 0919     Discharge Medications: Please see discharge summary for a list of discharge medications.  Relevant Imaging Results:  Relevant Lab Results:   Additional Information SSN: [id number]      Lilly Cove, [place]

## 2015-10-30 NOTE — Progress Notes (Signed)
[facility] Progress Note Patient Name: Cristina [name] DOB: 1960-05-24 MRN: [id number]   Date of Service  10/30/2015  HPI/Events of Note  Hyperglycemia, DM2, no SSI coverage  eICU Interventions  SSI AC/HS     Intervention Category Intermediate Interventions: Hyperglycemia - evaluation and treatment  Simonne Maffucci 10/30/2015, 4:02 PM

## 2015-10-30 NOTE — Care Management (Signed)
RNCM consult placed for placement.  CSW aware and following.  PT consult pending.  Please reconsult if indicated.

## 2015-10-30 NOTE — Clinical Social Work Placement (Signed)
   CLINICAL SOCIAL WORK PLACEMENT  NOTE  Date:  10/30/2015  Patient Details  Name: Cristina [name] MRN: [id number] Date of Birth: 1960/06/23  Clinical Social Work is seeking post-discharge placement for this patient at the [place] level of care (*CSW will initial, date and re-position this form in  chart as items are completed):  Yes   Patient/family provided with [place] Work Department's list of facilities offering this level of care within the geographic area requested by the patient (or if unable, by the patient's family).  Yes   Patient/family informed of their freedom to choose among providers that offer the needed level of care, that participate in Medicare, Medicaid or managed care program needed by the patient, have an available bed and are willing to accept the patient.  Yes   Patient/family informed of [facility]'s ownership interest in [facility] and [facility], as well as of the fact that they are under no obligation to receive care at these facilities.  PASRR submitted to EDS on       PASRR number received on       Existing PASRR number confirmed on 10/30/15     FL2 transmitted to all facilities in geographic area requested by pt/family on 10/30/15     FL2 transmitted to all facilities within larger geographic area on       Patient informed that his/her managed care company has contracts with or will negotiate with certain facilities, including the following:            Patient/family informed of bed offers received.  Patient chooses bed at       Physician recommends and patient chooses bed at      Patient to be transferred to   on  .  Patient to be transferred to facility by       Patient family notified on   of transfer.  Name of family member notified:        PHYSICIAN Please sign FL2     Additional Comment:    _______________________________________________ Lilly Cove, LCSW 10/30/2015, 1:20 PM

## 2015-10-30 NOTE — H&P (Signed)
PULMONARY / CRITICAL CARE MEDICINE   Name: Cristina [name] MRN: [id number] DOB: 07/12/1960    ADMISSION DATE:  10/28/2015 CONSULTATION DATE: 10/28/15  REFERRING MD:  Dr.Gayle  CHIEF COMPLAINT:  Altered Mental Status  HISTORY OF PRESENT ILLNESS:   Alert and awake, off vasopressors Can be transferred to gen med floor  Dr. Benjie Karvonen Notified of transfer of care     REVIEW OF SYSTEMS:    Review of Systems:  Gen:  Denies  fever, sweats, chills weigh loss   HEENT: Denies blurred vision, double vision, ear pain, eye pain, hearing loss, nose bleeds, sore throat  Cardiac:  No dizziness, chest pain or heaviness, chest tightness,edema  Resp:   Denies cough or sputum porduction, shortness of breath,wheezing, hemoptysis,   Gi: Denies swallowing difficulty, stomach pain, nausea or vomiting, diarrhea, constipation, bowel incontinence  Other:  All other systems negative    VITAL SIGNS: BP 116/69 (BP Location: Right Arm)   Pulse 94   Temp 98.2 F (36.8 C) (Oral)   Resp 18   Ht 4\' 11"  (1.499 m)   Wt 255 lb 4.7 oz (115.8 kg)   SpO2 95%   BMI 51.56 kg/m  CVP:  [6 mmHg-14 mmHg] 14 mmHg  INTAKE / OUTPUT: I/O last 3 completed shifts: In: 3178.5 [P.O.:250; I.V.:2378.5; IV Piggyback:550] Out: -   PHYSICAL EXAMINATION: General:  Morbidly obese white female, found on stretcher Neuro:  Opens eyes to voice, but confused HEENT: Atraumatic, normocephalic, no discharge, no JVD appreciated Cardiovascular:  S1S2,RRR, No MRG noted Lungs: symmetrical chest expansion, no wheezes, crackles, rhonchi noted Abdomen:  Obese,soft, nontender Musculoskeletal:  No inflammation/deformity noted Skin: grossly intact  LABS:  BMET  Recent Labs Lab 10/28/15 1940 10/29/15 0602  NA 136 139  K 3.8 3.7  CL 102 108  CO2 19* 22  BUN 78* 72*  CREATININE 3.05* 2.12*  GLUCOSE 159* 193*    Electrolytes  Recent Labs Lab 10/28/15 1940 10/29/15 0602  CALCIUM 8.9 8.1*  MG  --  2.3  PHOS  --  5.8*     CBC  Recent Labs Lab 10/28/15 1940 10/29/15 0602  WBC 11.8* 11.9*  HGB 11.2* 11.0*  HCT 33.9* 33.0*  PLT 208 222    Coag's No results for input(s): APTT, INR in the last 168 hours.  Sepsis Markers  Recent Labs Lab 10/28/15 1941  LATICACIDVEN 1.5    ABG No results for input(s): PHART, PCO2ART, PO2ART in the last 168 hours.  Liver Enzymes  Recent Labs Lab 10/28/15 1940  AST 125*  ALT 96*  ALKPHOS 112  BILITOT 0.6  ALBUMIN 2.6*    Cardiac Enzymes  Recent Labs Lab 10/28/15 1940  TROPONINI <0.03    Glucose  Recent Labs Lab 10/29/15 1132 10/29/15 1606 10/29/15 2005 10/29/15 2337 10/30/15 0352 10/30/15 0718  GLUCAP 123* 121* 209* 161* 135* 150*    Imaging No results found.   STUDIES:  9/11 CT Head>> negative   CULTURES: 9/11 urine culture>>  ANTIBIOTICS: 9/11 ceftriaxone>> 9/11 9/11 Ciprofloxacin>> 9/11 flagyl>>  SIGNIFICANT EVENTS: 9/11>> Patient admitted to the ICU due to sepsis possibly related to UTI, On levophed.  LINES/TUBES: 9/11 left IJ>>  DISCUSSION: 55 Year old female with diarrhea,UTI  And AMS, Possibly in septic shock requiring pressors-now off vasopressors   INFECTIOUS A:   Septic shock possibly UTI C-diff positive 06/20/15 improved  PULMONARY A: Hx of COPD Hx of sleep apnea P:   Continue to support with O2 to keep sats >88% Continue Ruthe Mannan [place]  RENAL A:   AKI r/t severe UTI Hx of incomplete bladder emptying P:   Continue rocephin Follow urine culture Monitor BMET closely Replace electrolytes per ICU protocol  GASTROINTESTINAL A:   Diarrhea-resolved   HEMATOLOGIC A:   No active issues P:  Heparin for DVT prophylaxis Transfuse if HgB<7 BC  ENDOCRINE A:    Diabetes Meitus P:   Blood sugar checks q4hours SSI coverage  NEUROLOGIC A:   Acute encephalopathy related to sepsis-resolved    I have personally reviewed/obtained a history, examined the patient, evaluated Pertinent  laboratory and RadioGraphic/imaging results, and  formulated the assessment and plan   The Patient requires high complexity decision making for assessment and support, frequent evaluation and titration of therapies, application of advanced monitoring technologies and extensive interpretation of multiple databases.    Case Discussed with Dr. Windell Moment transfer to hospitalist service and OK to transfer to gen med floor  Cristina [name], M.D.  [facility]  Medical Director [place] Director [facility] Cardio-Pulmonary Department

## 2015-10-30 NOTE — Clinical Social Work Note (Signed)
Clinical Social Work Assessment  Patient Details  Name: Cristina [name] MRN: [id number] Date of Birth: 12-Jul-1960  Date of referral:  10/30/15               Reason for consult:  Discharge Planning, Facility Placement                Permission sought to share information with:  Case Manager, Customer service manager, Teacher, music, Family Supports Permission granted to share information::  Yes, Verbal Permission Granted  Name::        Agency::     Relationship::  Daughter (best contact) Agricultural consultant Information:     Housing/Transportation Living arrangements for the past 2 months:  Tax adviser of Information:  Scientist, water quality, Adult Children Patient Interpreter Needed:  None Criminal Activity/Legal Involvement Pertinent to Current Situation/Hospitalization:  No - Comment as needed Significant Relationships:  Adult Children, Community Support Lives with:  Self Do you feel safe going back to the place where you live?  No (frequent falls, not taking care of self) Need for family participation in patient care:  Yes (Comment)  Care giving concerns:  LCSW was made aware by RN that daughter had questions regarding next steps with placement. LCSW met with daughter at the bedside.  Daughter reports patient lives alone in her apartment and was found down by landlord and brought to hospital. Daughter reports she is the only one who checks on patient and lives thirty minutes away, has 6 children, and works full time.  She reports she feels patient is unsafe at home with taking care of herself AEB feces all over the bathroom, patient slipped on own urine and fell, not taking correctly, and ?mismanagment of psychiatric medications/insulin.  Daughter reports she and patient discussed placement in June when she was admitted to [facility], but then she back out. Patient dx paranoid schizophrenic, managed by ACTT team [name], CAP services and CNA nurses.   Daughter feels patient needs placement even for  a ST to rebuild strength and better manage her health.    Social Worker assessment / plan:  LCSW discussed options of SNF placement and ALF placement.  At this time, PT needs to be ordered to understand current baseline of patient. RN made aware of order. Daughter is seeking SNF placement at this time and hopeful transition to ALF if patient willing.   Daughter agreeable for referrals to be sent out for placement options.  Daughter helps patient make decisions.    Plan: PT consult, ?SNF placement. Will also call ACT team regarding last injection and if still involved.  Patient also has a CAP worker whom daughter has been in touch with.  Will continue to follow and assist with needs  Employment status:  Unemployed, Disabled (Comment on whether or not currently receiving Disability) Insurance information:  Medicare, Medicaid In [place] PT Recommendations:  Not assessed at this time Information / Referral to community resources:  [place]  Patient/Family's Response to care:  Daughter open to SNF and agreeable to plan mentioned above.  Patient/Family's Understanding of and Emotional Response to Diagnosis, Current Treatment, and Prognosis:  Patient lacks understanding of care for self AEB being found down, mismanagement of medications, and poor self care with hygiene.    Daughter very reasonable and realistic with current condition of patient and needs.   Emotional Assessment Appearance:  Appears older than stated age Attitude/Demeanor/Rapport:  Unresponsive (sleeping, wakes up periodically, but would not engage) Affect (typically observed):  Stable, Withdrawn Orientation:  Oriented to Self Alcohol / Substance use:  Not Applicable Psych involvement (Current and /or in the community):  Outpatient Provider, Yes (Comment) (previous admission in 07/2015, outpatient with ACT: [facility], IM injections)  Discharge Needs  Concerns to be addressed:  Basic Needs, Decision making  concerns, Home Safety Concerns Readmission within the last 30 days:  No Current discharge risk:  Psychiatric Illness, Lives alone Barriers to Discharge:  Unsafe home situation, Continued Medical Work up   Lilly Cove, Marlinda Mike 10/30/2015, 12:56 PM

## 2015-10-30 NOTE — Progress Notes (Signed)
Key Points: Use following P&T approved IV to PO antibiotic change policy.  Description contains the criteria that are approved Note: Policy Excludes:  Esophagectomy patientsPHARMACIST - PHYSICIAN COMMUNICATION DR:   Mortimer Fries CONCERNING: IV to Oral Route Change Policy  RECOMMENDATION: This patient is receiving famotidine 20mg  by the intravenous route.  Based on criteria approved by the Pharmacy and Therapeutics Committee, the intravenous medication(s) is/are being converted to the equivalent oral dose form(s).   DESCRIPTION: These criteria include:  The patient is eating (either orally or via tube) and/or has been taking other orally administered medications for a least 24 hours  The patient has no evidence of active gastrointestinal bleeding or impaired GI absorption (gastrectomy, short bowel, patient on TNA or NPO).    Darrow Bussing, PharmD Pharmacy Resident 10/30/2015 10:55 AM

## 2015-10-31 LAB — GLUCOSE, CAPILLARY
Glucose-Capillary: 186 mg/dL — ABNORMAL HIGH (ref 65–99)
Glucose-Capillary: 177 mg/dL — ABNORMAL HIGH (ref 65–99)

## 2015-10-31 MED ORDER — HALOPERIDOL 5 MG PO TABS: 5.0000 mg | ORAL_TABLET | Freq: Every day | ORAL | 0 refills | Status: DC

## 2015-10-31 MED ORDER — MIRTAZAPINE 45 MG PO TABS: 45.0000 mg | ORAL_TABLET | Freq: Every day | ORAL | 0 refills | Status: DC

## 2015-10-31 MED ORDER — LORATADINE 10 MG PO TABS
10.0000 mg | ORAL_TABLET | Freq: Every day | ORAL | Status: DC
  Administered 2015-10-31: 10 mg via ORAL
  Filled 2015-10-31: qty 1

## 2015-10-31 MED ORDER — TIOTROPIUM BROMIDE MONOHYDRATE 18 MCG IN CAPS
18.0000 ug | ORAL_CAPSULE | Freq: Every day | RESPIRATORY_TRACT | Status: DC
  Administered 2015-10-31: 18 ug via RESPIRATORY_TRACT
  Filled 2015-10-31: qty 5

## 2015-10-31 MED ORDER — ATORVASTATIN CALCIUM 20 MG PO TABS: 20.0000 mg | ORAL_TABLET | Freq: Every day | ORAL | Status: DC

## 2015-10-31 MED ORDER — ASPIRIN EC 81 MG PO TBEC: 81.0000 mg | DELAYED_RELEASE_TABLET | Freq: Every day | ORAL | Status: DC

## 2015-10-31 MED ORDER — ALBUTEROL SULFATE HFA 108 (90 BASE) MCG/ACT IN AERS: 2.0000 | INHALATION_SPRAY | Freq: Four times a day (QID) | RESPIRATORY_TRACT | Status: DC | PRN

## 2015-10-31 MED ORDER — INSULIN ASPART 100 UNIT/ML ~~LOC~~ SOLN: 0.0000 [IU] | Freq: Three times a day (TID) | SUBCUTANEOUS | 11 refills | Status: DC

## 2015-10-31 MED ORDER — ALBUTEROL SULFATE (2.5 MG/3ML) 0.083% IN NEBU: 2.5000 mg | INHALATION_SOLUTION | Freq: Four times a day (QID) | RESPIRATORY_TRACT | Status: DC | PRN

## 2015-10-31 MED ORDER — INSULIN DETEMIR 100 UNIT/ML ~~LOC~~ SOLN: 25.0000 [IU] | Freq: Two times a day (BID) | SUBCUTANEOUS | 11 refills | Status: DC

## 2015-10-31 MED ORDER — MIRTAZAPINE 15 MG PO TABS: 45.0000 mg | ORAL_TABLET | Freq: Every day | ORAL | Status: DC

## 2015-10-31 MED ORDER — METOPROLOL TARTRATE 50 MG PO TABS
50.0000 mg | ORAL_TABLET | Freq: Two times a day (BID) | ORAL | Status: DC
  Administered 2015-10-31: 50 mg via ORAL
  Filled 2015-10-31: qty 1

## 2015-10-31 MED ORDER — HALOPERIDOL DECANOATE 100 MG/ML IM SOLN: 100.0000 mg | INTRAMUSCULAR | Status: DC

## 2015-10-31 MED ORDER — VENLAFAXINE HCL ER 75 MG PO CP24
150.0000 mg | ORAL_CAPSULE | Freq: Every day | ORAL | Status: DC
  Administered 2015-10-31: 150 mg via ORAL
  Filled 2015-10-31: qty 2

## 2015-10-31 MED ORDER — HALOPERIDOL 5 MG PO TABS: 5.0000 mg | ORAL_TABLET | Freq: Every day | ORAL | Status: DC

## 2015-10-31 MED ORDER — CLONAZEPAM 0.5 MG PO TABS: 0.5000 mg | ORAL_TABLET | Freq: Two times a day (BID) | ORAL | 0 refills | Status: DC

## 2015-10-31 NOTE — Care Management Important Message (Signed)
Important Message  Patient Details  Name: Cristina [name] MRN: [id number] Date of Birth: Apr 14, 1960   Medicare Important Message Given:  Yes    Beverly Sessions, RN 10/31/2015, 10:12 AM

## 2015-10-31 NOTE — Discharge Summary (Addendum)
[facility] NAME: Cristina [name]    MR#:  [id number]  DATE OF BIRTH:  1960/11/12  DATE OF ADMISSION:  10/28/2015 ADMITTING PHYSICIAN: Flora Lipps, MD  DATE OF DISCHARGE: 10/30/2015  PRIMARY CARE PHYSICIAN: Baltazar Apo, MD    ADMISSION DIAGNOSIS:  UTI (lower urinary tract infection) [N39.0] Hypotension, unspecified hypotension type [I95.9] Acute renal failure, unspecified acute renal failure type ([place]) [N17.9] Altered mental status, unspecified altered mental status type [R41.82]  DISCHARGE DIAGNOSIS:  Active Problems:   HYPOVOLEMICc shock ([place])   SECONDARY DIAGNOSIS:   Past Medical History:  Diagnosis Date  . Abdominal hernia   . Arthritis   . Asthma   . Bladder dysfunction   . Carpal tunnel syndrome   . CHF (congestive heart failure) ([place])   . COPD (chronic obstructive pulmonary disease) ([place])   . DDD (degenerative disc disease), lumbar   . Diabetes mellitus without complication ([place])   . Dyspnea   . Edema   . Fatty tumor   . GERD (gastroesophageal reflux disease)   . H/O colonoscopy   . H/O tubal ligation    hx btl  . History of cholecystectomy    hx of cholecystectomy  . HLD (hyperlipidemia)   . Hypertension   . Muscle cramps   . Neuropathy ([place])   . Obesity   . Recurrent boils   . Schizoaffective disorder ([place])   . Sleep apnea   . Stroke ([place])   . UTI (lower urinary tract infection)   . Vaginitis     HOSPITAL COURSE:   55 years old female with past medical history significant for COPD, CHF degenerative disc disease GERD, sleep apnea,schizoaffective disorder who presented with altered mental status and hypontesion.  1.Hypovolemic shock with metabolic encephalopathy: Initially thought to be septic shock, however all cultures including blood and urine are negative so sepsis essentially ruled ou. Chest XRAy was also negative. She was in shock due to profound diarrhea and volume depletion. This resolved and  she has been off of pressors for 2 days.  2. Diarrhea; Her diarrhea resolved once she arrived to hospital so C diff was never sent. She was empirically on cipro and flagyl but she does not need these anymore.  3. COPD: no signs of exacerbaton.  4. SAD: She is restarted on her medications and will need follow up with her psychiatrist.   5. HTN: She may restart metoprolol. If needed LAsix and Quinapril may be added in the futire.  6. AKI: This was from profound hypovolemia and shock with ATN This has improved with IVF. Lasix and ACEI are on hold for now.  7. HLD She may continue Atorvastatin.  8.Diabetes: Insuiln dose was decreaed for now but may be increased depending on blood sugars at facility  9. OSA: on CPAP  DISCHARGE CONDITIONS AND DIET:  Stable Diabetic diet  CONSULTS OBTAINED:    DRUG ALLERGIES:   Allergies  Allergen Reactions  . Shellfish-Derived Products Anaphylaxis and Shortness Of Breath    Stated by Patient  . Codeine   . Thorazine [Chlorpromazine] Swelling  . Tylenol With Codeine #3 [Acetaminophen-Codeine] Other (See Comments)    Hallucinations    DISCHARGE MEDICATIONS:   Current Discharge Medication List    START taking these medications   Details  insulin aspart (NOVOLOG) 100 UNIT/ML injection Inject 0-9 Units into the skin 3 (three) times daily with meals. CBG 70 - 120: 0 units CBG 121 - 150: 0 units CBG 151 -  200: 0 units CBG 201 - 250: 2 units CBG 251 - 300: 3 units CBG 301 - 350: 4 units CBG 351 - 400: 5 units Qty: 10 mL, Refills: 11      CONTINUE these medications which have CHANGED   Details  clonazePAM (KLONOPIN) 0.5 MG tablet Take 1 tablet (0.5 mg total) by mouth 2 (two) times daily. Qty: 30 tablet, Refills: 0    haloperidol (HALDOL) 5 MG tablet Take 1 tablet (5 mg total) by mouth at bedtime. Qty: 30 tablet, Refills: 0    insulin detemir (LEVEMIR) 100 UNIT/ML injection Inject 0.25 mLs (25 Units total) into the skin 2 (two) times  daily. Pt uses 50 units in the morning and 52 units at bedtime. Qty: 10 mL, Refills: 11    mirtazapine (REMERON) 45 MG tablet Take 1 tablet (45 mg total) by mouth at bedtime. Qty: 60 tablet, Refills: 0      CONTINUE these medications which have NOT CHANGED   Details  aspirin EC 81 MG tablet Take 81 mg by mouth daily.    atorvastatin (LIPITOR) 20 MG tablet Take 20 mg by mouth at bedtime.    cetirizine (ZYRTEC) 10 MG tablet Take 10 mg by mouth daily as needed for allergies.    esomeprazole (NEXIUM) 40 MG capsule Take 40 mg by mouth daily.     Fluticasone-Salmeterol (ADVAIR) 250-50 MCG/DOSE AEPB Inhale 1 puff into the lungs 2 (two) times daily.    haloperidol decanoate (HALDOL DECANOATE) 100 MG/ML injection Inject 1 mL (100 mg total) into the muscle every 30 (thirty) days. Qty: 1 mL, Refills: 0    meloxicam (MOBIC) 7.5 MG tablet Take 1 tablet (7.5 mg total) by mouth 2 (two) times daily. Qty: 60 tablet, Refills: 0    metoprolol (LOPRESSOR) 50 MG tablet Take 50 mg by mouth 2 (two) times daily.    pantoprazole (PROTONIX) 40 MG tablet Take 40 mg by mouth 2 (two) times daily.    tiotropium (SPIRIVA) 18 MCG inhalation capsule Place 18 mcg into inhaler and inhale daily.    venlafaxine XR (EFFEXOR-XR) 150 MG 24 hr capsule Take 150 mg by mouth daily.    albuterol (PROVENTIL HFA;VENTOLIN HFA) 108 (90 BASE) MCG/ACT inhaler Inhale 2 puffs into the lungs every 6 (six) hours as needed for wheezing or shortness of breath.      STOP taking these medications     cyclobenzaprine (FLEXERIL) 10 MG tablet      diclofenac sodium (VOLTAREN) 1 % GEL      furosemide (LASIX) 40 MG tablet      lidocaine (LIDODERM) 5 %      linagliptin (TRADJENTA) 5 MG TABS tablet      potassium chloride (K-DUR) 10 MEQ tablet      pregabalin (LYRICA) 100 MG capsule      quinapril (ACCUPRIL) 20 MG tablet      traZODone (DESYREL) 100 MG tablet               Today   CHIEF COMPLAINT:  Patient answers  yes/no questions not very engaged in conversation   VITAL SIGNS:  Blood pressure (!) 149/66, pulse 80, temperature 98.5 F (36.9 C), temperature source Oral, resp. rate 18, height 4\' 11"  (1.499 m), weight 114.6 kg (252 lb 9.6 oz), SpO2 94 %.   REVIEW OF SYSTEMS:  Review of Systems  Constitutional: Negative for chills, fever and malaise/fatigue.  HENT: Negative.  Negative for ear discharge, ear pain, hearing loss, nosebleeds and sore throat.  Eyes: Negative.  Negative for blurred vision and pain.  Respiratory: Negative.  Negative for cough, hemoptysis, shortness of breath and wheezing.   Cardiovascular: Negative.  Negative for chest pain, palpitations and leg swelling.  Gastrointestinal: Negative.  Negative for abdominal pain, blood in stool, diarrhea, nausea and vomiting.  Genitourinary: Negative.  Negative for dysuria.  Musculoskeletal: Negative.  Negative for back pain.  Skin: Negative.   Neurological: Positive for weakness. Negative for dizziness, tremors, speech change, focal weakness, seizures and headaches.  Endo/Heme/Allergies: Negative.  Does not bruise/bleed easily.  Psychiatric/Behavioral: Negative.  Negative for depression, hallucinations and suicidal ideas.       Shcizoaffective disorder     PHYSICAL EXAMINATION:  GENERAL:  55 y.o.-year-old patient lying in the bed with no acute distress morbidly obese.  NECK:  Supple, no jugular venous distention. No thyroid enlargement, no tenderness.  LUNGS: Normal breath sounds bilaterally, no wheezing, rales,rhonchi  No use of accessory muscles of respiration.  CARDIOVASCULAR: S1, S2 normal. No murmurs, rubs, or gallops.  ABDOMEN: obese, Soft, non-tender, non-distended. Bowel sounds present. No organomegaly or mass.  EXTREMITIES: No pedal edema, cyanosis, or clubbing.  PSYCHIATRIC: The patient is alert and oriented x 3 says she is in hospital not aware of month but says 2017.  SKIN: No obvious rash, lesion, or ulcer.   DATA  REVIEW:   CBC  Recent Labs Lab 10/29/15 0602  WBC 11.9*  HGB 11.0*  HCT 33.0*  PLT 222    Chemistries   Recent Labs Lab 10/28/15 1940 10/29/15 0602  NA 136 139  K 3.8 3.7  CL 102 108  CO2 19* 22  GLUCOSE 159* 193*  BUN 78* 72*  CREATININE 3.05* 2.12*  CALCIUM 8.9 8.1*  MG  --  2.3  AST 125*  --   ALT 96*  --   ALKPHOS 112  --   BILITOT 0.6  --     Cardiac Enzymes  Recent Labs Lab 10/28/15 1940  TROPONINI <0.03    Microbiology Results  @[email or web address] @  RADIOLOGY:  No results found.    Management plans discussed with the patient and daughter and she is in agreement. Stable for discharge SNF  Patient should follow up with pcp  CODE STATUS:     Code Status Orders        Start     Ordered   10/28/15 2340  Full code  Continuous     10/28/15 2340    Code Status History    Date Active Date Inactive Code Status Order ID Comments User Context   07/30/2015 11:33 PM 08/03/2015  3:41 PM Full Code [id number]  Gonzella Lex, MD Inpatient   12/05/2014  2:25 AM 12/07/2014  8:38 PM Full Code [id number]  Hildred Priest, MD Inpatient   09/24/2014  9:18 AM 09/24/2014  5:37 PM Full Code [id number]  Dionisio David, MD Inpatient   09/23/2014  7:58 PM 09/24/2014  9:18 AM Full Code [id number]  Dustin Flock, MD Inpatient      TOTAL TIME TAKING CARE OF THIS PATIENT: 38 minutes.    Note: This dictation was prepared with Dragon dictation along with smaller phrase technology. Any transcriptional errors that result from this process are unintentional.  Anastasya Jewell M.D on 10/31/2015 at 9:13 AM  Between 7am to 6pm - Pager - [phone] After 6pm go to [email or web address] - password [email or web address] [facility]  Office  [phone]  CC: Primary care physician; Baltazar Apo, MD

## 2015-10-31 NOTE — Progress Notes (Signed)
Pt to be discharged per MD order. IV removed. IJ central line removed with sterile technique. Care pack prepared by Mel Almond with social work. Report called to [facility].

## 2015-10-31 NOTE — Evaluation (Signed)
Physical Therapy Evaluation Patient Details Name: Cristina [name] MRN: [id number] DOB: Oct 01, 1960 Today's Date: 10/31/2015   History of Present Illness  Pt. 55 y.o. female found on the floor of her apt. with decreased level of consiousness byher landlord, EMS transported to ED with low BP. Admitted for sepsis secondary to UTI, since transferred out of CCU, head and cervical CT negative  Clinical Impression  Pt. Supine awake in bed upon arrival, unable to assess if oriented. Pt. Did not participate in verbal communication throughout session and demonstrated limited ability to follow commands. She did tolerate general assessment and activity. Pt. Demonstrates grossly 3/5 B UE and LE strength, AROM WFL for tasks assessed, Gross PROM assessment WNL. Pt. Able to transfer to EOB with max A x2.  Pt. Tolerated sitting EOB approx 5 minutes with stable trunk control CGA. Unable/unsafe to attempt further mobility such as ambulation and transfers. Required max A x2 to return to supine and for positioning in bed. Would benefit from skilled PT to address above deficits and promote optimal return to PLOF. Recommend SNF placement upon d/c to follow up with PT services.      Follow Up Recommendations SNF    Equipment Recommendations       Recommendations for Other Services       Precautions / Restrictions Precautions Precautions: Fall Restrictions Weight Bearing Restrictions: No      Mobility  Bed Mobility Overal bed mobility: +2 for physical assistance Bed Mobility: Supine to Sit;Sit to Supine     Supine to sit: +2 for physical assistance;HOB elevated Sit to supine: +2 for physical assistance;HOB elevated   General bed mobility comments: Pt. required max A x2 for supine<>sit transfers, assist provided at trunk and B LE.  max A for bed positioning once supine.  Transfers                    Ambulation/Gait                Stairs            Wheelchair Mobility    Modified  Rankin (Stroke Patients Only)       Balance Overall balance assessment: Needs assistance Sitting-balance support: No upper extremity supported;Feet unsupported Sitting balance-[name]: Fair Sitting balance - Comments: Pt. able to sit EOB with CGA and perform limited B LE movements (ankle pumps, knee extension)                                     Pertinent Vitals/Pain Pain Assessment: Faces Faces Pain Scale: Hurts a little bit Pain Location: when initiating PROM in L LE  Pain Intervention(s): Monitored during session;Repositioned    Home Living Family/patient expects to be discharged to:: Private residence (Per chart review) Living Arrangements: Alone (Per daughter in chart review)   Type of Home: Apartment (Per daughter's comments from chart review)                Prior Function           Comments: Unable to obtain PLOF from Pt. Information available obtained from chart review     Hand Dominance        Extremity/Trunk Assessment   Upper Extremity Assessment: Generalized weakness (B UE grossly 3/5 symmetrical, full PROM unable to follow commands to demonstrate AROM)           Lower Extremity Assessment:  (B LE grossly  3/5 symmetrical, AROM within functional range for tasks assessed. PROM grossly WNL)         Communication   Communication: Other (comment) (Pt. did not participate in verbal communication throughout duration of session)  Cognition Arousal/Alertness: Awake/alert Behavior During Therapy: Flat affect Overall Cognitive Status: No family/caregiver present to determine baseline cognitive functioning (Pt. did not participate in verbal communicaiton throughout session, difficult to assess cognition. Per nursing her present cognition is an improvement since admission)                      General Comments      Exercises        Assessment/Plan    PT Assessment Patient needs continued PT services  PT Diagnosis  Generalized weakness   PT Problem List Decreased strength;Decreased activity tolerance;Decreased balance;Decreased mobility;Decreased safety awareness  PT Treatment Interventions DME instruction;Gait training;Functional mobility training;Therapeutic activities;Therapeutic exercise;Balance training;Patient/family education   PT Goals (Current goals can be found in the Care Plan section) Acute Rehab PT Goals PT Goal Formulation: Patient unable to participate in goal setting    Frequency Min 2X/week   Barriers to discharge Decreased caregiver support      Co-evaluation               End of Session   Activity Tolerance: Patient tolerated treatment well Patient left: in bed;with call bell/phone within reach;with bed alarm set           Time: 1131-1147 PT Time Calculation (min) (ACUTE ONLY): 16 min   Charges:   PT Evaluation $PT Eval Moderate Complexity: 1 Procedure     PT G Codes:        Melanie Crazier 11-13-2015, 12:58 PM

## 2015-10-31 NOTE — Plan of Care (Signed)
Problem: Education: Goal: Knowledge of [facility] Education information/materials will improve Outcome: Not Progressing Patient needs family assistance.  Problem: Health Behavior/Discharge Planning: Goal: Ability to manage health-related needs will improve Outcome: Not Progressing Patient needs family assistance.

## 2015-10-31 NOTE — Clinical Social Work Placement (Signed)
   CLINICAL SOCIAL WORK PLACEMENT  NOTE  Date:  10/31/2015  Patient Details  Name: Cristina [name] MRN: [id number] Date of Birth: 21-Jul-1960  Clinical Social Work is seeking post-discharge placement for this patient at the [place] level of care (*CSW will initial, date and re-position this form in  chart as items are completed):  Yes   Patient/family provided with [facility] Work Department's list of facilities offering this level of care within the geographic area requested by the patient (or if unable, by the patient's family).  Yes   Patient/family informed of their freedom to choose among providers that offer the needed level of care, that participate in Medicare, Medicaid or managed care program needed by the patient, have an available bed and are willing to accept the patient.  Yes   Patient/family informed of [facility]'s ownership interest in [facility] and [facility], as well as of the fact that they are under no obligation to receive care at these facilities.  PASRR submitted to EDS on       PASRR number received on       Existing PASRR number confirmed on 10/30/15     FL2 transmitted to all facilities in geographic area requested by pt/family on 10/30/15     FL2 transmitted to all facilities within larger geographic area on       Patient informed that his/her managed care company has contracts with or will negotiate with certain facilities, including the following:        Yes   Patient/family informed of bed offers received.  Patient chooses bed at  [facility] )     Physician recommends and patient chooses bed at      Patient to be transferred to  [facility] ) on 10/31/15.  Patient to be transferred to facility by  [facility] )     Patient family notified on 10/31/15 of transfer.  Name of family member notified:   (Patient's daughter Jimmy Picket is aware of D/C today. )     PHYSICIAN        Additional Comment:    _______________________________________________ Da [place]he[place], Veronia Beets, LCSW 10/31/2015, 11:12 AM

## 2015-10-31 NOTE — Progress Notes (Signed)
Patient's neurological orientation is different from baseline, according to landlord over phone. Unsure during morning shift of patient's ability to swallow and take instructions to take medications. Assessed swallowing ability of patient and patient's ability to follow instructions; patient was able to swallow pill with prompting; this nurse reported to precepting nurse and medications were able to be administered to patient.

## 2015-10-31 NOTE — Progress Notes (Signed)
Clinical Education officer, museum (CSW) contacted patient's daughter Jimmy Picket and presented bed offers. Daughter chose [facility]. Per daughter she will bring patient's cpap from home to [place]. Per [place] admissions coordinator at [facility] patient will go to room 28-B. RN will call report and arrange EMS for transport. RN has agreed to call patient's daughter Jimmy Picket when EMS arrives. CSW sent D/C orders to [place] via [place]. Patient is aware of above. Patient's daughter is in agreement with plan. Please reconsult if future social work needs arise. CSW signing off.   [employer], LCSW [phone]

## 2015-11-02 LAB — CULTURE, BLOOD (ROUTINE X 2)
Culture: NO GROWTH
Culture: NO GROWTH

## 2016-06-16 ENCOUNTER — Emergency Department: Payer: Medicare Other

## 2016-06-16 ENCOUNTER — Emergency Department
Admission: EM | Admit: 2016-06-16 | Discharge: 2016-06-16 | Disposition: A | Discharge status: Home / Self Care | Payer: Medicare Other | Attending: Emergency Medicine | Admitting: Emergency Medicine

## 2016-06-16 DIAGNOSIS — I509 Heart failure, unspecified: Secondary | ICD-10-CM | POA: Insufficient documentation

## 2016-06-16 DIAGNOSIS — Z794 Long term (current) use of insulin: Secondary | ICD-10-CM | POA: Insufficient documentation

## 2016-06-16 DIAGNOSIS — E119 Type 2 diabetes mellitus without complications: Secondary | ICD-10-CM | POA: Diagnosis not present

## 2016-06-16 DIAGNOSIS — R079 Chest pain, unspecified: Secondary | ICD-10-CM

## 2016-06-16 DIAGNOSIS — Z79899 Other long term (current) drug therapy: Secondary | ICD-10-CM | POA: Insufficient documentation

## 2016-06-16 DIAGNOSIS — R0789 Other chest pain: Secondary | ICD-10-CM | POA: Insufficient documentation

## 2016-06-16 DIAGNOSIS — I110 Hypertensive heart disease with heart failure: Secondary | ICD-10-CM | POA: Diagnosis not present

## 2016-06-16 DIAGNOSIS — J449 Chronic obstructive pulmonary disease, unspecified: Secondary | ICD-10-CM | POA: Diagnosis not present

## 2016-06-16 DIAGNOSIS — Z87891 Personal history of nicotine dependence: Secondary | ICD-10-CM | POA: Insufficient documentation

## 2016-06-16 DIAGNOSIS — J45909 Unspecified asthma, uncomplicated: Secondary | ICD-10-CM | POA: Insufficient documentation

## 2016-06-16 DIAGNOSIS — Z7982 Long term (current) use of aspirin: Secondary | ICD-10-CM | POA: Diagnosis not present

## 2016-06-16 LAB — BASIC METABOLIC PANEL
Anion gap: 5 (ref 5–15)
BUN: 12 mg/dL (ref 6–20)
CO2: 31 mmol/L (ref 22–32)
Calcium: 8.1 mg/dL — ABNORMAL LOW (ref 8.9–10.3)
Chloride: 95 mmol/L — ABNORMAL LOW (ref 101–111)
Creatinine, Ser: 0.37 mg/dL — ABNORMAL LOW (ref 0.44–1.00)
GFR calc Af Amer: >60 mL/min (ref >60)
GFR calc non Af Amer: >60 mL/min (ref >60)
Glucose, Bld: 137 mg/dL — ABNORMAL HIGH (ref 65–99)
Potassium: 3.6 mmol/L (ref 3.5–5.1)
Sodium: 131 mmol/L — ABNORMAL LOW (ref 135–145)

## 2016-06-16 LAB — CBC
HCT: 31.3 % — ABNORMAL LOW (ref 35.0–47.0)
Hemoglobin: 10 g/dL — ABNORMAL LOW (ref 12.0–16.0)
MCH: 23.6 pg — ABNORMAL LOW (ref 26.0–34.0)
MCHC: 32.1 g/dL (ref 32.0–36.0)
MCV: 73.7 fL — ABNORMAL LOW (ref 80.0–100.0)
Platelets: 191 10*3/uL (ref 150–440)
RBC: 4.24 MIL/uL (ref 3.80–5.20)
RDW: 16.7 % — ABNORMAL HIGH (ref 11.5–14.5)
WBC: 8.6 10*3/uL (ref 3.6–11.0)

## 2016-06-16 LAB — TROPONIN I
Troponin I: <0.03 ng/mL (ref <0.03)
Troponin I: <0.03 ng/mL (ref <0.03)

## 2016-06-16 MED ORDER — LORAZEPAM 2 MG/ML IJ SOLN
0.5000 mg | Freq: Once | INTRAMUSCULAR | Status: AC
  Administered 2016-06-16: 0.5 mg via INTRAVENOUS
  Filled 2016-06-16: qty 1

## 2016-06-16 NOTE — ED Notes (Signed)
Pt assisted to toilet to void 

## 2016-06-16 NOTE — ED Triage Notes (Signed)
Pt brought to er via ems for c/o chest pain and shortness of breath - pt has hx of MI - pt reports nausea and chest pain over the left breast that radiates into her back - ems gave zofran 4mg , asa 324mg , and 1 ntg spray - pt respirations are even and unlabored at this time and pt appears in no acute distress

## 2016-06-16 NOTE — ED Notes (Signed)
Pt brought to er via ems for c/o chest pain and shortness of breath - pt has hx of MI - pt reports nausea and chest pain over the left breast that radiates into her back - ems gave zofran 4mg , asa 324mg , and 1 ntg spray - pt respirations are even and unlabored at this time and pt appears in no acute distress

## 2016-06-16 NOTE — ED Provider Notes (Signed)
[facility] Emergency Department Provider Note  ____________________________________________   I have reviewed the triage vital signs and the nursing notes.   HISTORY  Chief Complaint Chest Pain   History limited by: Not Limited   HPI Cristina [name] is a 56 y.o. female who presents to the emergency department today because of concerns for chest pain. Patient describes as being located left chest with some radiation to the left arm. It started early today. It is still present. Patient has not had any shortness of breath with it. She has not had any sweating with it. The patient does have a history of heart attack and states that the pain does somewhat reminder of her previous heart attack. In addition it does sound like the patient has been under a lot of stress and her living facility.   Past Medical History:  Diagnosis Date  . Abdominal hernia   . Arthritis   . Asthma   . Bladder dysfunction   . Carpal tunnel syndrome   . CHF (congestive heart failure) ([place])   . COPD (chronic obstructive pulmonary disease) ([place])   . DDD (degenerative disc disease), lumbar   . Diabetes mellitus without complication ([place])   . Dyspnea   . Edema   . Fatty tumor   . GERD (gastroesophageal reflux disease)   . H/O colonoscopy   . H/O tubal ligation    hx btl  . History of cholecystectomy    hx of cholecystectomy  . HLD (hyperlipidemia)   . Hypertension   . Muscle cramps   . Neuropathy   . Obesity   . Recurrent boils   . Schizoaffective disorder ([place])   . Sleep apnea   . Stroke ([place])   . UTI (lower urinary tract infection)   . Vaginitis     Patient Active Problem List   Diagnosis Date Noted  . Septic shock ([place]) 10/28/2015  . COPD (chronic obstructive pulmonary disease) ([place]) 07/31/2015  . Aspirin overdose 07/30/2015  . Schizoaffective disorder, bipolar type ([place]) 12/05/2014  . HTN (hypertension) 12/05/2014  . Sleep apnea 12/05/2014  . Diabetes ([place])  12/05/2014  . Incomplete bladder emptying 12/04/2014  . Recurrent UTI 12/04/2014  . Polyneuropathy 03/20/2009  . Arthritis, degenerative 05/23/2007  . HLD (hyperlipidemia) 05/23/2007    Past Surgical History:  Procedure Laterality Date  . CARDIAC CATHETERIZATION Left 09/24/2014   Procedure: Left Heart Cath and Coronary Angiography;  Surgeon: Dionisio David, MD;  Location: [place] [facility];  Service: Cardiovascular;  Laterality: Left;  . CHOLECYSTECTOMY    . COLONOSCOPY WITH PROPOFOL N/A 08/12/2015   Procedure: COLONOSCOPY WITH PROPOFOL;  Surgeon: Lollie Sails, MD;  Location: [facility] ENDOSCOPY;  Service: Endoscopy;  Laterality: N/A;  . ESOPHAGOGASTRODUODENOSCOPY (EGD) WITH PROPOFOL N/A 08/12/2015   Procedure: ESOPHAGOGASTRODUODENOSCOPY (EGD) WITH PROPOFOL;  Surgeon: Lollie Sails, MD;  Location: [facility] ENDOSCOPY;  Service: Endoscopy;  Laterality: N/A;  . tubal ligation      Prior to Admission medications   Medication Sig Start Date End Date Taking? Authorizing Provider  albuterol (PROVENTIL HFA;VENTOLIN HFA) 108 (90 BASE) MCG/ACT inhaler Inhale 2 puffs into the lungs every 6 (six) hours as needed for wheezing or shortness of breath.    Historical Provider, MD  aspirin EC 81 MG tablet Take 81 mg by mouth daily.    Historical Provider, MD  atorvastatin (LIPITOR) 20 MG tablet Take 20 mg by mouth at bedtime.    Historical Provider, MD  cetirizine (ZYRTEC) 10 MG tablet Take 10  mg by mouth daily as needed for allergies.    Historical Provider, MD  clonazePAM (KLONOPIN) 0.5 MG tablet Take 1 tablet (0.5 mg total) by mouth 2 (two) times daily. 10/31/15   Bettey Costa, MD  esomeprazole (NEXIUM) 40 MG capsule Take 40 mg by mouth daily.     Historical Provider, MD  Fluticasone-Salmeterol (ADVAIR) 250-50 MCG/DOSE AEPB Inhale 1 puff into the lungs 2 (two) times daily.    Historical Provider, MD  haloperidol (HALDOL) 5 MG tablet Take 1 tablet (5 mg total) by mouth at bedtime. 10/31/15   Bettey Costa, MD   haloperidol decanoate (HALDOL DECANOATE) 100 MG/ML injection Inject 1 mL (100 mg total) into the muscle every 30 (thirty) days. 08/02/15   Hildred Priest, MD  insulin aspart (NOVOLOG) 100 UNIT/ML injection Inject 0-9 Units into the skin 3 (three) times daily with meals. CBG 70 - 120: 0 units CBG 121 - 150: 0 units CBG 151 - 200: 0 units CBG 201 - 250: 2 units CBG 251 - 300: 3 units CBG 301 - 350: 4 units CBG 351 - 400: 5 units 10/31/15   Sital Mody, MD  insulin detemir (LEVEMIR) 100 UNIT/ML injection Inject 0.25 mLs (25 Units total) into the skin 2 (two) times daily. Pt uses 50 units in the morning and 52 units at bedtime. 10/31/15   Bettey Costa, MD  meloxicam (MOBIC) 7.5 MG tablet Take 1 tablet (7.5 mg total) by mouth 2 (two) times daily. 08/02/15   Hildred Priest, MD  metoprolol (LOPRESSOR) 50 MG tablet Take 50 mg by mouth 2 (two) times daily.    Historical Provider, MD  mirtazapine (REMERON) 45 MG tablet Take 1 tablet (45 mg total) by mouth at bedtime. 10/31/15   Bettey Costa, MD  pantoprazole (PROTONIX) 40 MG tablet Take 40 mg by mouth 2 (two) times daily.    Historical Provider, MD  tiotropium (SPIRIVA) 18 MCG inhalation capsule Place 18 mcg into inhaler and inhale daily.    Historical Provider, MD  venlafaxine XR (EFFEXOR-XR) 150 MG 24 hr capsule Take 150 mg by mouth daily.    Historical Provider, MD    Allergies Shellfish-derived products; Codeine; Thorazine [chlorpromazine]; and Tylenol with codeine #3 [acetaminophen-codeine]  Family History  Problem Relation Age of Onset  . CAD Mother     Social History Social History  Substance Use Topics  . Smoking status: Former Research scientist (life sciences)  . Smokeless tobacco: Never Used  . Alcohol use No    Review of Systems Constitutional: No fever/chills Eyes: No visual changes. ENT: No sore throat. Cardiovascular: Positive for chest pain. Respiratory: Denies shortness of breath. Gastrointestinal: No abdominal pain.  No nausea, no  vomiting.  No diarrhea.   Genitourinary: Negative for dysuria. Musculoskeletal: Negative for back pain. Skin: Negative for rash. Neurological: Negative for headaches, focal weakness or numbness.  ____________________________________________   PHYSICAL EXAM:  VITAL SIGNS: ED Triage Vitals  Enc Vitals Group     BP 06/16/16 1941 (!) 144/83     Pulse Rate 06/16/16 1941 77     Resp 06/16/16 1941 17     Temp 06/16/16 1941 97.8 F (36.6 C)     Temp Source 06/16/16 1941 Oral     SpO2 06/16/16 1938 98 %     Weight 06/16/16 1942 230 lb (104.3 kg)     Height 06/16/16 1942 4\' 11"  (1.499 m)     Head Circumference --      Peak Flow --      Pain Score 06/16/16  1941 10     Pain Loc --      Pain Edu? --      Excl. in [place]? --      Constitutional: Alert and oriented. Well appearing and in no distress. Eyes: Conjunctivae are normal. Normal extraocular movements. ENT   Head: Normocephalic and atraumatic.   Nose: No congestion/rhinnorhea.   Mouth/Throat: Mucous membranes are moist.   Neck: No stridor. Hematological/Lymphatic/Immunilogical: No cervical lymphadenopathy. Cardiovascular: Normal rate, regular rhythm.  No murmurs, rubs, or gallops.  Respiratory: Normal respiratory effort without tachypnea nor retractions. Breath sounds are clear and equal bilaterally. No wheezes/rales/rhonchi. Gastrointestinal: Soft and non tender. No rebound. No guarding.  Genitourinary: Deferred Musculoskeletal: Normal range of motion in all extremities. No lower extremity edema. Neurologic:  Normal speech and language. No gross focal neurologic deficits are appreciated.  Skin:  Skin is warm, dry and intact. No rash noted. Psychiatric: Mood and affect are normal. Speech and behavior are normal. Patient exhibits appropriate insight and judgment.  ____________________________________________    LABS (pertinent positives/negatives)  Labs Reviewed  BASIC METABOLIC PANEL - Abnormal; Notable for the  following:       Result Value   Sodium 131 (*)    Chloride 95 (*)    Glucose, Bld 137 (*)    Creatinine, Ser 0.37 (*)    Calcium 8.1 (*)    All other components within normal limits  CBC - Abnormal; Notable for the following:    Hemoglobin 10.0 (*)    HCT 31.3 (*)    MCV 73.7 (*)    MCH 23.6 (*)    RDW 16.7 (*)    All other components within normal limits  TROPONIN I  TROPONIN I     ____________________________________________   EKG  I, Nance Pear, attending physician, personally viewed and interpreted this EKG  EKG Time: 1939 Rate: 74 Rhythm: normal sinus rhythm Axis: normal Intervals: qtc 476 QRS: RBBB ST changes: no st elevation, t wave inversion III, V1, V2, V3, V4 Impression: abnormal ekg  ____________________________________________    RADIOLOGY  CXR IMPRESSION:  1. Mild hyperinflation without focal infiltrate  2. Borderline to mild cardiomegaly without pulmonary edema     ____________________________________________   PROCEDURES  Procedures  ____________________________________________   INITIAL IMPRESSION / ASSESSMENT AND PLAN / ED COURSE  Pertinent labs & imaging results that were available during my care of the patient were reviewed by me and considered in my medical decision making (see chart for details).  Patient presented to the emergency department today because of concerns for chest pain. EKG without any new or acute findings. The patient initial troponin was negative. X-ray without any pneumonia or pneumothorax. I did have a discussion with patient. She is high risk and I did offer and recommend admission. However the patient stated she would not want to be admitted. She was fully however to undergo a second troponin. I did discuss with her that if this is negative and if she still desired to go home we could certainly discharge her. I did however discuss that this was risk given high risk of heart disease and I discussed importance  of close follow-up with her cardiologist.  ____________________________________________   FINAL CLINICAL IMPRESSION(S) / ED DIAGNOSES  Chest pain  Note: This dictation was prepared with Dragon dictation. Any transcriptional errors that result from this process are unintentional     Nance Pear, MD 06/16/16 [phone]

## 2016-06-16 NOTE — ED Notes (Signed)
Resumed care from teresa rn. Pt alert.  No chest pain.  Family with pt.  nsr on monitor.

## 2016-07-15 ENCOUNTER — Encounter: Payer: Self-pay | Admitting: Emergency Medicine

## 2016-07-15 ENCOUNTER — Inpatient Hospital Stay
Admission: EM | Admit: 2016-07-15 | Discharge: 2016-07-18 | DRG: 871 | Disposition: A | Discharge status: Home Health | Payer: Medicare Other | Attending: Internal Medicine | Admitting: Internal Medicine

## 2016-07-15 ENCOUNTER — Emergency Department: Payer: Medicare Other

## 2016-07-15 DIAGNOSIS — I5033 Acute on chronic diastolic (congestive) heart failure: Secondary | ICD-10-CM | POA: Diagnosis present

## 2016-07-15 DIAGNOSIS — G4733 Obstructive sleep apnea (adult) (pediatric): Secondary | ICD-10-CM | POA: Diagnosis present

## 2016-07-15 DIAGNOSIS — Z8249 Family history of ischemic heart disease and other diseases of the circulatory system: Secondary | ICD-10-CM | POA: Diagnosis not present

## 2016-07-15 DIAGNOSIS — Z9981 Dependence on supplemental oxygen: Secondary | ICD-10-CM

## 2016-07-15 DIAGNOSIS — Y95 Nosocomial condition: Secondary | ICD-10-CM | POA: Diagnosis present

## 2016-07-15 DIAGNOSIS — Z791 Long term (current) use of non-steroidal anti-inflammatories (NSAID): Secondary | ICD-10-CM

## 2016-07-15 DIAGNOSIS — F259 Schizoaffective disorder, unspecified: Secondary | ICD-10-CM | POA: Diagnosis present

## 2016-07-15 DIAGNOSIS — R0902 Hypoxemia: Secondary | ICD-10-CM | POA: Diagnosis present

## 2016-07-15 DIAGNOSIS — J189 Pneumonia, unspecified organism: Secondary | ICD-10-CM | POA: Diagnosis present

## 2016-07-15 DIAGNOSIS — Z794 Long term (current) use of insulin: Secondary | ICD-10-CM

## 2016-07-15 DIAGNOSIS — Z8673 Personal history of transient ischemic attack (TIA), and cerebral infarction without residual deficits: Secondary | ICD-10-CM

## 2016-07-15 DIAGNOSIS — Z888 Allergy status to other drugs, medicaments and biological substances status: Secondary | ICD-10-CM | POA: Diagnosis not present

## 2016-07-15 DIAGNOSIS — J440 Chronic obstructive pulmonary disease with acute lower respiratory infection: Secondary | ICD-10-CM | POA: Diagnosis present

## 2016-07-15 DIAGNOSIS — A419 Sepsis, unspecified organism: Secondary | ICD-10-CM | POA: Diagnosis not present

## 2016-07-15 DIAGNOSIS — R739 Hyperglycemia, unspecified: Secondary | ICD-10-CM

## 2016-07-15 DIAGNOSIS — Z7951 Long term (current) use of inhaled steroids: Secondary | ICD-10-CM

## 2016-07-15 DIAGNOSIS — N179 Acute kidney failure, unspecified: Secondary | ICD-10-CM | POA: Diagnosis present

## 2016-07-15 DIAGNOSIS — R6520 Severe sepsis without septic shock: Secondary | ICD-10-CM | POA: Diagnosis present

## 2016-07-15 DIAGNOSIS — E1165 Type 2 diabetes mellitus with hyperglycemia: Secondary | ICD-10-CM | POA: Diagnosis present

## 2016-07-15 DIAGNOSIS — K219 Gastro-esophageal reflux disease without esophagitis: Secondary | ICD-10-CM | POA: Diagnosis present

## 2016-07-15 DIAGNOSIS — B9620 Unspecified Escherichia coli [E. coli] as the cause of diseases classified elsewhere: Secondary | ICD-10-CM | POA: Diagnosis present

## 2016-07-15 DIAGNOSIS — Z87440 Personal history of urinary (tract) infections: Secondary | ICD-10-CM | POA: Diagnosis not present

## 2016-07-15 DIAGNOSIS — E1140 Type 2 diabetes mellitus with diabetic neuropathy, unspecified: Secondary | ICD-10-CM | POA: Diagnosis present

## 2016-07-15 DIAGNOSIS — Z6841 Body Mass Index (BMI) 40.0 and over, adult: Secondary | ICD-10-CM | POA: Diagnosis not present

## 2016-07-15 DIAGNOSIS — R509 Fever, unspecified: Secondary | ICD-10-CM

## 2016-07-15 DIAGNOSIS — Z87891 Personal history of nicotine dependence: Secondary | ICD-10-CM

## 2016-07-15 DIAGNOSIS — N390 Urinary tract infection, site not specified: Secondary | ICD-10-CM | POA: Diagnosis present

## 2016-07-15 DIAGNOSIS — Z87892 Personal history of anaphylaxis: Secondary | ICD-10-CM

## 2016-07-15 DIAGNOSIS — I110 Hypertensive heart disease with heart failure: Secondary | ICD-10-CM | POA: Diagnosis present

## 2016-07-15 DIAGNOSIS — Z7982 Long term (current) use of aspirin: Secondary | ICD-10-CM

## 2016-07-15 DIAGNOSIS — E785 Hyperlipidemia, unspecified: Secondary | ICD-10-CM | POA: Diagnosis present

## 2016-07-15 DIAGNOSIS — Z91013 Allergy to seafood: Secondary | ICD-10-CM

## 2016-07-15 DIAGNOSIS — Z885 Allergy status to narcotic agent status: Secondary | ICD-10-CM

## 2016-07-15 LAB — CBC WITH DIFFERENTIAL/PLATELET
Basophils Absolute: 0 10*3/uL (ref 0–0.1)
Basophils Relative: 0 %
Eosinophils Absolute: 0 10*3/uL (ref 0–0.7)
Eosinophils Relative: 0 %
HCT: 27.2 % — ABNORMAL LOW (ref 35.0–47.0)
Hemoglobin: 8.9 g/dL — ABNORMAL LOW (ref 12.0–16.0)
Lymphocytes Relative: 7 %
Lymphs Abs: 0.7 10*3/uL — ABNORMAL LOW (ref 1.0–3.6)
MCH: 23.5 pg — ABNORMAL LOW (ref 26.0–34.0)
MCHC: 32.8 g/dL (ref 32.0–36.0)
MCV: 71.7 fL — ABNORMAL LOW (ref 80.0–100.0)
Monocytes Absolute: 1.1 10*3/uL — ABNORMAL HIGH (ref 0.2–0.9)
Monocytes Relative: 10 %
Neutro Abs: 8.8 10*3/uL — ABNORMAL HIGH (ref 1.4–6.5)
Neutrophils Relative %: 83 %
Platelets: 169 10*3/uL (ref 150–440)
RBC: 3.8 MIL/uL (ref 3.80–5.20)
RDW: 17.2 % — ABNORMAL HIGH (ref 11.5–14.5)
WBC: 10.6 10*3/uL (ref 3.6–11.0)

## 2016-07-15 LAB — URINALYSIS, COMPLETE (UACMP) WITH MICROSCOPIC
Bilirubin Urine: NEGATIVE
Glucose, UA: >=500 mg/dL — AB
Ketones, ur: 5 mg/dL — AB
Nitrite: NEGATIVE
Protein, ur: 100 mg/dL — AB
Specific Gravity, Urine: 1.013 (ref 1.005–1.030)
Squamous Epithelial / LPF: NONE SEEN
pH: 5 (ref 5.0–8.0)

## 2016-07-15 LAB — BLOOD GAS, ARTERIAL
Acid-Base Excess: 5.8 mmol/L — ABNORMAL HIGH (ref 0.0–2.0)
Bicarbonate: 29.8 mmol/L — ABNORMAL HIGH (ref 20.0–28.0)
FIO2: 0.28
O2 Saturation: 93.6 %
Patient temperature: 37
pCO2 arterial: 40 mmHg (ref 32.0–48.0)
pH, Arterial: 7.48 — ABNORMAL HIGH (ref 7.350–7.450)
pO2, Arterial: 64 mmHg — ABNORMAL LOW (ref 83.0–108.0)

## 2016-07-15 LAB — PROTIME-INR
INR: 1.11
Prothrombin Time: 14.4 seconds (ref 11.4–15.2)

## 2016-07-15 LAB — COMPREHENSIVE METABOLIC PANEL
ALT: 58 U/L — ABNORMAL HIGH (ref 14–54)
AST: 92 U/L — ABNORMAL HIGH (ref 15–41)
Albumin: 2.7 g/dL — ABNORMAL LOW (ref 3.5–5.0)
Alkaline Phosphatase: 149 U/L — ABNORMAL HIGH (ref 38–126)
Anion gap: 11 (ref 5–15)
BUN: 26 mg/dL — ABNORMAL HIGH (ref 6–20)
CO2: 28 mmol/L (ref 22–32)
Calcium: 7.9 mg/dL — ABNORMAL LOW (ref 8.9–10.3)
Chloride: 95 mmol/L — ABNORMAL LOW (ref 101–111)
Creatinine, Ser: 1.1 mg/dL — ABNORMAL HIGH (ref 0.44–1.00)
GFR calc Af Amer: >60 mL/min (ref >60)
GFR calc non Af Amer: 54 mL/min — ABNORMAL LOW (ref >60)
Glucose, Bld: 416 mg/dL — ABNORMAL HIGH (ref 65–99)
Potassium: 3.9 mmol/L (ref 3.5–5.1)
Sodium: 134 mmol/L — ABNORMAL LOW (ref 135–145)
Total Bilirubin: 0.7 mg/dL (ref 0.3–1.2)
Total Protein: 7.1 g/dL (ref 6.5–8.1)

## 2016-07-15 LAB — GLUCOSE, CAPILLARY
Glucose-Capillary: 392 mg/dL — ABNORMAL HIGH (ref 65–99)
Glucose-Capillary: 493 mg/dL — ABNORMAL HIGH (ref 65–99)
Glucose-Capillary: 536 mg/dL (ref 65–99)
Glucose-Capillary: 399 mg/dL — ABNORMAL HIGH (ref 65–99)
Glucose-Capillary: 293 mg/dL — ABNORMAL HIGH (ref 65–99)

## 2016-07-15 LAB — LACTIC ACID, PLASMA
Lactic Acid, Venous: 1.4 mmol/L (ref 0.5–1.9)
Lactic Acid, Venous: 1.5 mmol/L (ref 0.5–1.9)

## 2016-07-15 LAB — GLUCOSE, RANDOM: Glucose, Bld: 539 mg/dL (ref 65–99)

## 2016-07-15 LAB — MRSA PCR SCREENING: MRSA by PCR: NEGATIVE

## 2016-07-15 LAB — BRAIN NATRIURETIC PEPTIDE
B Natriuretic Peptide: 49 pg/mL (ref 0.0–100.0)
B Natriuretic Peptide: 48 pg/mL (ref 0.0–100.0)

## 2016-07-15 MED ORDER — SENNOSIDES-DOCUSATE SODIUM 8.6-50 MG PO TABS
1.0000 | ORAL_TABLET | Freq: Every evening | ORAL | Status: DC | PRN
  Administered 2016-07-15: 20:00:00 1 via ORAL
  Filled 2016-07-15: qty 1

## 2016-07-15 MED ORDER — INSULIN ASPART 100 UNIT/ML ~~LOC~~ SOLN
0.0000 [IU] | Freq: Every day | SUBCUTANEOUS | Status: DC
  Administered 2016-07-15 – 2016-07-16 (×2): 3 [IU] via SUBCUTANEOUS
  Filled 2016-07-15: qty 3
  Filled 2016-07-15: qty 4

## 2016-07-15 MED ORDER — PANTOPRAZOLE SODIUM 40 MG PO TBEC
40.0000 mg | DELAYED_RELEASE_TABLET | Freq: Two times a day (BID) | ORAL | Status: DC
  Administered 2016-07-15 – 2016-07-18 (×7): 40 mg via ORAL
  Filled 2016-07-15 (×7): qty 1

## 2016-07-15 MED ORDER — ENOXAPARIN SODIUM 40 MG/0.4ML ~~LOC~~ SOLN
40.0000 mg | Freq: Two times a day (BID) | SUBCUTANEOUS | Status: DC
  Administered 2016-07-15 – 2016-07-18 (×7): 40 mg via SUBCUTANEOUS
  Filled 2016-07-15 (×7): qty 0.4

## 2016-07-15 MED ORDER — ALBUTEROL SULFATE HFA 108 (90 BASE) MCG/ACT IN AERS: 2.0000 | INHALATION_SPRAY | Freq: Four times a day (QID) | RESPIRATORY_TRACT | Status: DC | PRN

## 2016-07-15 MED ORDER — DEXTROSE 5 % IV SOLN
2.0000 g | Freq: Three times a day (TID) | INTRAVENOUS | Status: DC
  Administered 2016-07-15 – 2016-07-17 (×7): 2 g via INTRAVENOUS
  Filled 2016-07-15 (×8): qty 2

## 2016-07-15 MED ORDER — INSULIN ASPART 100 UNIT/ML ~~LOC~~ SOLN: 0.0000 [IU] | Freq: Every day | SUBCUTANEOUS | Status: DC

## 2016-07-15 MED ORDER — ACETAMINOPHEN 500 MG PO TABS: 500.0000 mg | ORAL_TABLET | Freq: Three times a day (TID) | ORAL | Status: DC | PRN

## 2016-07-15 MED ORDER — PANTOPRAZOLE SODIUM 40 MG PO TBEC: 40.0000 mg | DELAYED_RELEASE_TABLET | Freq: Two times a day (BID) | ORAL | Status: DC

## 2016-07-15 MED ORDER — UMECLIDINIUM-VILANTEROL 62.5-25 MCG/INH IN AEPB
1.0000 | INHALATION_SPRAY | Freq: Every day | RESPIRATORY_TRACT | Status: DC
  Administered 2016-07-15 – 2016-07-18 (×4): 1 via RESPIRATORY_TRACT
  Filled 2016-07-15: qty 14

## 2016-07-15 MED ORDER — PREGABALIN 50 MG PO CAPS
100.0000 mg | ORAL_CAPSULE | Freq: Three times a day (TID) | ORAL | Status: DC
  Administered 2016-07-15 – 2016-07-18 (×10): 100 mg via ORAL
  Filled 2016-07-15 (×10): qty 2

## 2016-07-15 MED ORDER — FUROSEMIDE 10 MG/ML IJ SOLN: 40.0000 mg | Freq: Once | INTRAMUSCULAR | Status: DC

## 2016-07-15 MED ORDER — VENLAFAXINE HCL ER 75 MG PO CP24: 150.0000 mg | ORAL_CAPSULE | Freq: Every day | ORAL | Status: DC

## 2016-07-15 MED ORDER — ACETAMINOPHEN 325 MG PO TABS
650.0000 mg | ORAL_TABLET | Freq: Four times a day (QID) | ORAL | Status: DC | PRN
  Administered 2016-07-15 – 2016-07-16 (×2): 650 mg via ORAL
  Filled 2016-07-15 (×2): qty 2

## 2016-07-15 MED ORDER — HYDROCODONE-ACETAMINOPHEN 5-325 MG PO TABS
1.0000 | ORAL_TABLET | ORAL | Status: DC | PRN
  Administered 2016-07-16: 2 via ORAL
  Filled 2016-07-15: qty 2

## 2016-07-15 MED ORDER — MOMETASONE FURO-FORMOTEROL FUM 200-5 MCG/ACT IN AERO
2.0000 | INHALATION_SPRAY | Freq: Two times a day (BID) | RESPIRATORY_TRACT | Status: DC
  Administered 2016-07-15: 2 via RESPIRATORY_TRACT
  Filled 2016-07-15: qty 8.8

## 2016-07-15 MED ORDER — FUROSEMIDE 10 MG/ML IJ SOLN
20.0000 mg | Freq: Two times a day (BID) | INTRAMUSCULAR | Status: DC
  Administered 2016-07-15 – 2016-07-16 (×3): 20 mg via INTRAVENOUS
  Filled 2016-07-15 (×3): qty 2

## 2016-07-15 MED ORDER — BUDESONIDE 0.5 MG/2ML IN SUSP
0.5000 mg | Freq: Two times a day (BID) | RESPIRATORY_TRACT | Status: DC
  Administered 2016-07-15 – 2016-07-18 (×6): 0.5 mg via RESPIRATORY_TRACT
  Filled 2016-07-15 (×6): qty 2

## 2016-07-15 MED ORDER — VANCOMYCIN HCL IN DEXTROSE 1-5 GM/200ML-% IV SOLN
1000.0000 mg | Freq: Once | INTRAVENOUS | Status: AC
  Administered 2016-07-15: 1000 mg via INTRAVENOUS
  Filled 2016-07-15: qty 200

## 2016-07-15 MED ORDER — DEXTROSE 5 % IV SOLN
2.0000 g | Freq: Once | INTRAVENOUS | Status: AC
  Administered 2016-07-15: 2 g via INTRAVENOUS
  Filled 2016-07-15: qty 2

## 2016-07-15 MED ORDER — MIRTAZAPINE 15 MG PO TABS
45.0000 mg | ORAL_TABLET | Freq: Every day | ORAL | Status: DC
  Administered 2016-07-15 – 2016-07-17 (×3): 45 mg via ORAL
  Filled 2016-07-15 (×3): qty 3

## 2016-07-15 MED ORDER — VENLAFAXINE HCL ER 75 MG PO CP24
225.0000 mg | ORAL_CAPSULE | Freq: Every day | ORAL | Status: DC
  Administered 2016-07-15 – 2016-07-18 (×4): 225 mg via ORAL
  Filled 2016-07-15 (×4): qty 3

## 2016-07-15 MED ORDER — ACETAMINOPHEN 650 MG RE SUPP: 650.0000 mg | Freq: Four times a day (QID) | RECTAL | Status: DC | PRN

## 2016-07-15 MED ORDER — NITROGLYCERIN 0.4 MG SL SUBL: 0.4000 mg | SUBLINGUAL_TABLET | SUBLINGUAL | Status: DC | PRN

## 2016-07-15 MED ORDER — INSULIN ASPART 100 UNIT/ML ~~LOC~~ SOLN
8.0000 [IU] | Freq: Once | SUBCUTANEOUS | Status: AC
  Administered 2016-07-15: 8 [IU] via INTRAVENOUS
  Filled 2016-07-15: qty 8

## 2016-07-15 MED ORDER — ATORVASTATIN CALCIUM 20 MG PO TABS
20.0000 mg | ORAL_TABLET | Freq: Every day | ORAL | Status: DC
  Administered 2016-07-15 – 2016-07-17 (×3): 20 mg via ORAL
  Filled 2016-07-15 (×3): qty 1

## 2016-07-15 MED ORDER — ASPIRIN EC 81 MG PO TBEC
81.0000 mg | DELAYED_RELEASE_TABLET | Freq: Every day | ORAL | Status: DC
  Administered 2016-07-15 – 2016-07-18 (×4): 81 mg via ORAL
  Filled 2016-07-15 (×4): qty 1

## 2016-07-15 MED ORDER — ONDANSETRON HCL 4 MG PO TABS: 4.0000 mg | ORAL_TABLET | Freq: Four times a day (QID) | ORAL | Status: DC | PRN

## 2016-07-15 MED ORDER — TIOTROPIUM BROMIDE MONOHYDRATE 18 MCG IN CAPS
18.0000 ug | ORAL_CAPSULE | Freq: Every morning | RESPIRATORY_TRACT | Status: DC
  Administered 2016-07-15: 10:00:00 18 ug via RESPIRATORY_TRACT
  Filled 2016-07-15: qty 5

## 2016-07-15 MED ORDER — METOPROLOL TARTRATE 50 MG PO TABS
50.0000 mg | ORAL_TABLET | Freq: Two times a day (BID) | ORAL | Status: DC
  Administered 2016-07-15 – 2016-07-17 (×5): 50 mg via ORAL
  Administered 2016-07-18: 10:00:00 12.5 mg via ORAL
  Filled 2016-07-15 (×7): qty 1

## 2016-07-15 MED ORDER — ALBUTEROL SULFATE (2.5 MG/3ML) 0.083% IN NEBU: 2.5000 mg | INHALATION_SOLUTION | Freq: Four times a day (QID) | RESPIRATORY_TRACT | Status: DC | PRN

## 2016-07-15 MED ORDER — INSULIN DETEMIR 100 UNIT/ML ~~LOC~~ SOLN
25.0000 [IU] | Freq: Two times a day (BID) | SUBCUTANEOUS | Status: DC
  Administered 2016-07-15 – 2016-07-16 (×3): 25 [IU] via SUBCUTANEOUS
  Filled 2016-07-15 (×4): qty 0.25

## 2016-07-15 MED ORDER — ONDANSETRON HCL 4 MG/2ML IJ SOLN: 4.0000 mg | Freq: Four times a day (QID) | INTRAMUSCULAR | Status: DC | PRN

## 2016-07-15 MED ORDER — DIPHENOXYLATE-ATROPINE 2.5-0.025 MG PO TABS: 1.0000 | ORAL_TABLET | Freq: Every day | ORAL | Status: DC | PRN

## 2016-07-15 MED ORDER — VENLAFAXINE HCL ER 75 MG PO CP24: 75.0000 mg | ORAL_CAPSULE | Freq: Every day | ORAL | Status: DC

## 2016-07-15 MED ORDER — VANCOMYCIN HCL IN DEXTROSE 1-5 GM/200ML-% IV SOLN
1000.0000 mg | Freq: Two times a day (BID) | INTRAVENOUS | Status: DC
  Administered 2016-07-15 – 2016-07-16 (×3): 1000 mg via INTRAVENOUS
  Filled 2016-07-15 (×5): qty 200

## 2016-07-15 MED ORDER — INSULIN ASPART 100 UNIT/ML ~~LOC~~ SOLN
0.0000 [IU] | Freq: Three times a day (TID) | SUBCUTANEOUS | Status: DC
  Administered 2016-07-15 (×2): 20 [IU] via SUBCUTANEOUS
  Filled 2016-07-15 (×2): qty 20
  Filled 2016-07-15: qty 11

## 2016-07-15 MED ORDER — ACETAMINOPHEN 500 MG PO TABS
1000.0000 mg | ORAL_TABLET | Freq: Once | ORAL | Status: AC
  Administered 2016-07-15: 1000 mg via ORAL
  Filled 2016-07-15: qty 2

## 2016-07-15 MED ORDER — HALOPERIDOL 5 MG PO TABS
5.0000 mg | ORAL_TABLET | Freq: Every day | ORAL | Status: DC
  Administered 2016-07-15 – 2016-07-17 (×3): 5 mg via ORAL
  Filled 2016-07-15 (×5): qty 1

## 2016-07-15 MED ORDER — LORATADINE 10 MG PO TABS
10.0000 mg | ORAL_TABLET | Freq: Every day | ORAL | Status: DC
  Administered 2016-07-15 – 2016-07-18 (×4): 10 mg via ORAL
  Filled 2016-07-15 (×4): qty 1

## 2016-07-15 MED ORDER — CLONAZEPAM 0.5 MG PO TABS
0.5000 mg | ORAL_TABLET | Freq: Two times a day (BID) | ORAL | Status: DC
  Administered 2016-07-15 – 2016-07-18 (×7): 0.5 mg via ORAL
  Filled 2016-07-15 (×7): qty 1

## 2016-07-15 MED ORDER — PANTOPRAZOLE SODIUM 40 MG PO TBEC
40.0000 mg | DELAYED_RELEASE_TABLET | Freq: Every day | ORAL | Status: DC
Start: 2016-07-15 — End: 2016-07-15

## 2016-07-15 MED ORDER — INSULIN ASPART 100 UNIT/ML ~~LOC~~ SOLN: 0.0000 [IU] | Freq: Three times a day (TID) | SUBCUTANEOUS | Status: DC

## 2016-07-15 MED ORDER — KETOROLAC TROMETHAMINE 15 MG/ML IJ SOLN: 15.0000 mg | Freq: Four times a day (QID) | INTRAMUSCULAR | Status: DC | PRN

## 2016-07-15 NOTE — ED Provider Notes (Signed)
[facility] Emergency Department Provider Note   ____________________________________________   First MD Initiated Contact with Patient 07/15/16 [phone]     (approximate)  I have reviewed the triage vital signs and the nursing notes.   HISTORY  Chief Complaint Hyperglycemia and Urinary Tract Infection  Limited by altered mentation  HPI Cristina [name] is a 56 y.o. female brought to the ED from [facility] via EMS with a chief complaint of altered mentation.Patient has a history of COPD, CHF, diabetes, schizoaffective disorder, frequent UTIs. Initially staff called EMS for hyperglycemia with a blood sugar of 534. Also suspects UTIs secondary to patient's complaints of dysuria. Patient arrives hypoxic with room air saturations 80%. Patient states she is supposed to wear oxygen but she usually does not. Complains of fever, cough and dysuria. Denies headache, chest pain, abdominal pain, nausea, vomiting, diarrhea. Denies recent travel or trauma.   Past Medical History:  Diagnosis Date  . Abdominal hernia   . Arthritis   . Asthma   . Bladder dysfunction   . Carpal tunnel syndrome   . CHF (congestive heart failure) ([place])   . COPD (chronic obstructive pulmonary disease) ([place])   . DDD (degenerative disc disease), lumbar   . Diabetes mellitus without complication ([place])   . Dyspnea   . Edema   . Fatty tumor   . GERD (gastroesophageal reflux disease)   . H/O colonoscopy   . H/O tubal ligation    hx btl  . History of cholecystectomy    hx of cholecystectomy  . HLD (hyperlipidemia)   . Hypertension   . Muscle cramps   . Neuropathy   . Obesity   . Recurrent boils   . Schizoaffective disorder ([place])   . Sleep apnea   . Stroke ([place])   . UTI (lower urinary tract infection)   . Vaginitis     Patient Active Problem List   Diagnosis Date Noted  . Sepsis ([place]) 07/15/2016  . Septic shock ([place]) 10/28/2015  . COPD (chronic obstructive  pulmonary disease) ([place]) 07/31/2015  . Aspirin overdose 07/30/2015  . Schizoaffective disorder, bipolar type ([place]) 12/05/2014  . HTN (hypertension) 12/05/2014  . Sleep apnea 12/05/2014  . Diabetes ([place]) 12/05/2014  . Incomplete bladder emptying 12/04/2014  . Recurrent UTI 12/04/2014  . Polyneuropathy 03/20/2009  . Arthritis, degenerative 05/23/2007  . HLD (hyperlipidemia) 05/23/2007    Past Surgical History:  Procedure Laterality Date  . CARDIAC CATHETERIZATION Left 09/24/2014   Procedure: Left Heart Cath and Coronary Angiography;  Surgeon: Dionisio David, MD;  Location: [place] CV LAB;  Service: Cardiovascular;  Laterality: Left;  . CHOLECYSTECTOMY    . COLONOSCOPY WITH PROPOFOL N/A 08/12/2015   Procedure: COLONOSCOPY WITH PROPOFOL;  Surgeon: Lollie Sails, MD;  Location: [facility] ENDOSCOPY;  Service: Endoscopy;  Laterality: N/A;  . ESOPHAGOGASTRODUODENOSCOPY (EGD) WITH PROPOFOL N/A 08/12/2015   Procedure: ESOPHAGOGASTRODUODENOSCOPY (EGD) WITH PROPOFOL;  Surgeon: Lollie Sails, MD;  Location: [facility] ENDOSCOPY;  Service: Endoscopy;  Laterality: N/A;  . tubal ligation      Prior to Admission medications   Medication Sig Start Date End Date Taking? Authorizing Provider  acetaminophen (TYLENOL) 500 MG tablet Take 500 mg by mouth every 8 (eight) hours as needed for mild pain or moderate pain.   Yes [provider]  aspirin EC 81 MG tablet Take 81 mg by mouth daily.   Yes [provider]  atorvastatin (LIPITOR) 20 MG tablet Take 20 mg by mouth at bedtime.  Yes [provider]  cetirizine (ZYRTEC) 10 MG tablet Take 10 mg by mouth daily as needed for allergies.   Yes [provider]  clonazePAM (KLONOPIN) 0.5 MG tablet Take 1 tablet (0.5 mg total) by mouth 2 (two) times daily. Patient taking differently: Take 0.5-1 mg by mouth 2 (two) times daily. Take 0.5 mg by mouth in the morning and take 1 mg by mouth at bedtime. 10/31/15  Yes Mody, Ulice Bold, MD    diphenoxylate-atropine (LOMOTIL) 2.5-0.025 MG tablet Take 1 tablet by mouth daily as needed for diarrhea or loose stools.   Yes [provider]  Fluticasone-Salmeterol (ADVAIR) 250-50 MCG/DOSE AEPB Inhale 1 puff into the lungs 2 (two) times daily.   Yes [provider]  furosemide (LASIX) 40 MG tablet Take 40 mg by mouth daily.   Yes [provider]  haloperidol (HALDOL) 5 MG tablet Take 1 tablet (5 mg total) by mouth at bedtime. 10/31/15  Yes Bettey Costa, MD  haloperidol decanoate (HALDOL DECANOATE) 100 MG/ML injection Inject 1 mL (100 mg total) into the muscle every 30 (thirty) days. 08/02/15  Yes Hildred Priest, MD  insulin detemir (LEVEMIR) 100 UNIT/ML injection Inject 0.25 mLs (25 Units total) into the skin 2 (two) times daily. Pt uses 50 units in the morning and 52 units at bedtime. Patient taking differently: Inject 25 Units into the skin daily.  10/31/15  Yes Mody, Ulice Bold, MD  meloxicam (MOBIC) 7.5 MG tablet Take 1 tablet (7.5 mg total) by mouth 2 (two) times daily. 08/02/15  Yes Hildred Priest, MD  metFORMIN (GLUCOPHAGE-XR) 500 MG 24 hr tablet Take 500 mg by mouth 2 (two) times daily. 05/23/15  Yes [provider]  metoprolol (LOPRESSOR) 50 MG tablet Take 50 mg by mouth 2 (two) times daily.   Yes [provider]  mirtazapine (REMERON) 45 MG tablet Take 1 tablet (45 mg total) by mouth at bedtime. 10/31/15  Yes Mody, Ulice Bold, MD  nitroGLYCERIN (NITROSTAT) 0.3 MG SL tablet Place 0.3 mg under the tongue every 5 (five) minutes as needed for chest pain.   Yes [provider]  ondansetron (ZOFRAN) 4 MG tablet Take 4 mg by mouth every 6 (six) hours as needed for nausea or vomiting.   Yes [provider]  pantoprazole (PROTONIX) 40 MG tablet Take 40 mg by mouth 2 (two) times daily.   Yes [provider]  pregabalin (LYRICA) 100 MG capsule Take 100 mg by mouth 3 (three) times daily.   Yes [provider]   umeclidinium-vilanterol (ANORO ELLIPTA) 62.5-25 MCG/INH AEPB Inhale 1 puff into the lungs daily.   Yes [provider]  venlafaxine XR (EFFEXOR-XR) 150 MG 24 hr capsule Take 150 mg by mouth daily.   Yes [provider]  venlafaxine XR (EFFEXOR-XR) 75 MG 24 hr capsule Take 75 mg by mouth daily with breakfast.   Yes [provider]  albuterol (PROVENTIL HFA;VENTOLIN HFA) 108 (90 BASE) MCG/ACT inhaler Inhale 2 puffs into the lungs every 6 (six) hours as needed for wheezing or shortness of breath.    [provider]  insulin aspart (NOVOLOG) 100 UNIT/ML injection Inject 0-9 Units into the skin 3 (three) times daily with meals. CBG 70 - 120: 0 units CBG 121 - 150: 0 units CBG 151 - 200: 0 units CBG 201 - 250: 2 units CBG 251 - 300: 3 units CBG 301 - 350: 4 units CBG 351 - 400: 5 units Patient not taking: Reported on 07/15/2016 10/31/15   Mody,  Sital, MD    Allergies Shellfish-derived products; Codeine; Thorazine [chlorpromazine]; and Tylenol with codeine #3 [acetaminophen-codeine]  Family History  Problem Relation Age of Onset  . CAD Mother     Social History Social History  Substance Use Topics  . Smoking status: Former Research scientist (life sciences)  . Smokeless tobacco: Never Used  . Alcohol use No    Review of Systems  Constitutional: Positive for fever/chills. Eyes: No visual changes. ENT: No sore throat. Cardiovascular: Denies chest pain. Respiratory: Positive for shortness of breath. Gastrointestinal: No abdominal pain.  No nausea, no vomiting.  No diarrhea.  No constipation. Genitourinary: Positive for dysuria. Musculoskeletal: Negative for back pain. Skin: Negative for rash. Neurological: Negative for headaches, focal weakness or numbness.   ____________________________________________   PHYSICAL EXAM:  VITAL SIGNS: ED Triage Vitals  Enc Vitals Group     BP 07/15/16 0551 132/65     Pulse Rate 07/15/16 0551 (!) 109     Resp 07/15/16 0551 (!) 22      Temp 07/15/16 0557 100 F (37.8 C)     Temp Source 07/15/16 0557 Oral     SpO2 07/15/16 0550 (!) 83 %     Weight 07/15/16 0552 257 lb 4.8 oz (116.7 kg)     Height --      Head Circumference --      Peak Flow --      Pain Score 07/15/16 0550 10     Pain Loc --      Pain Edu? --      Excl. in [place]? --     Constitutional: Alert and oriented. Ill appearing and in moderate acute distress. Eyes: Conjunctivae are normal. PERRL. EOMI. Head: Atraumatic. Nose: No congestion/rhinnorhea. Mouth/Throat: Mucous membranes are moist.  Oropharynx non-erythematous. Neck: No stridor.  Supple neck without meningismus. Cardiovascular: Tachycardic rate, regular rhythm. Grossly normal heart sounds.  Good peripheral circulation. Respiratory: Increased respiratory effort.  No retractions. Lungs with rales and rhonchi. Gastrointestinal: Obese. Soft and nontender. No distention. No abdominal bruits. No CVA tenderness. Musculoskeletal: No lower extremity tenderness. 2+ nonpitting BLE edema.  No joint effusions. Neurologic:  Normal speech and language. No gross focal neurologic deficits are appreciated.  Skin:  Skin is hot, dry and intact. No rash noted. No petechiae. Psychiatric: Mood and affect are normal. Speech and behavior are normal.  ____________________________________________   LABS (all labs ordered are listed, but only abnormal results are displayed)  Labs Reviewed  COMPREHENSIVE METABOLIC PANEL - Abnormal; Notable for the following:       Result Value   Sodium 134 (*)    Chloride 95 (*)    Glucose, Bld 416 (*)    BUN 26 (*)    Creatinine, Ser 1.10 (*)    Calcium 7.9 (*)    Albumin 2.7 (*)    AST 92 (*)    ALT 58 (*)    Alkaline Phosphatase 149 (*)    GFR calc non Af Amer 54 (*)    All other components within normal limits  CBC WITH DIFFERENTIAL/PLATELET - Abnormal; Notable for the following:    Hemoglobin 8.9 (*)    HCT 27.2 (*)    MCV 71.7 (*)    MCH 23.5 (*)    RDW 17.2 (*)     Neutro Abs 8.8 (*)    Lymphs Abs 0.7 (*)    Monocytes Absolute 1.1 (*)    All other components within normal limits  URINALYSIS, COMPLETE (UACMP) WITH MICROSCOPIC - Abnormal; Notable for the following:  Color, Urine YELLOW (*)    APPearance TURBID (*)    Glucose, UA >=500 (*)    Hgb urine dipstick SMALL (*)    Ketones, ur 5 (*)    Protein, ur 100 (*)    Leukocytes, UA MODERATE (*)    Bacteria, UA MANY (*)    Non Squamous Epithelial 0-5 (*)    All other components within normal limits  GLUCOSE, CAPILLARY - Abnormal; Notable for the following:    Glucose-Capillary 392 (*)    All other components within normal limits  BLOOD GAS, ARTERIAL - Abnormal; Notable for the following:    pH, Arterial 7.48 (*)    pO2, Arterial 64 (*)    Bicarbonate 29.8 (*)    Acid-Base Excess 5.8 (*)    All other components within normal limits  CULTURE, BLOOD (ROUTINE X 2)  CULTURE, BLOOD (ROUTINE X 2)  LACTIC ACID, PLASMA  PROTIME-INR  LACTIC ACID, PLASMA  BRAIN NATRIURETIC PEPTIDE  BRAIN NATRIURETIC PEPTIDE   ____________________________________________  EKG  ED ECG REPORT I, Kanin Lia J, the attending physician, personally viewed and interpreted this ECG.   Date: 07/15/2016  EKG Time: 0554  Rate: 108  Rhythm: sinus tachycardia  Axis: Normal  Intervals:right bundle branch block  ST&T Change: Nonspecific  ____________________________________________  RADIOLOGY  Portable chest x-ray interpreted per Dr. Radene Knee: Mild bibasilar airspace opacities raise concern for pneumonia. Mild  cardiomegaly.   ____________________________________________   PROCEDURES  Procedure(s) performed: None  Procedures  Critical Care performed: Yes, see critical care note(s)   CRITICAL CARE Performed by: Paulette Blanch   Total critical care time: 30 minutes  Critical care time was exclusive of separately billable procedures and treating other patients.  Critical care was necessary to treat or prevent  imminent or life-threatening deterioration.  Critical care was time spent personally by me on the following activities: development of treatment plan with patient and/or surrogate as well as nursing, discussions with consultants, evaluation of patient's response to treatment, examination of patient, obtaining history from patient or surrogate, ordering and performing treatments and interventions, ordering and review of laboratory studies, ordering and review of radiographic studies, pulse oximetry and re-evaluation of patient's condition.  ____________________________________________   INITIAL IMPRESSION / ASSESSMENT AND PLAN / ED COURSE  Pertinent labs & imaging results that were available during my care of the patient were reviewed by me and considered in my medical decision making (see chart for details).  56 year old female who presents with hyperglycemia, altered mentation, fever and hypoxia. ED code sepsis initiated upon patient's arrival. Will cover her empirically for clinical suspicion of HCAP. Screening labwork for sepsis obtained. Anticipate hospitalization.  Clinical Course as of Jul 15 657  Wed Jul 15, 2016  4166 Chest x-ray concerning for HCAP. IV antibiotics infusing. Given patient's history of CHF, will start off by giving judicious fluids. If her lactate is greater than 4, then will give patient 30 cc/kg IV fluids.  [JS]  0630 Lactate is normal. Discuss with hospitalist; will order Lasix 40 mg IV as a one-time dose.  [JS]    Clinical Course User Index [JS] Paulette Blanch, MD     ____________________________________________   FINAL CLINICAL IMPRESSION(S) / ED DIAGNOSES  Final diagnoses:  Sepsis, due to unspecified organism ([place])  Hyperglycemia  Hypoxia  HCAP (healthcare-associated pneumonia)  Fever, unspecified fever cause  Lower urinary tract infectious disease      NEW MEDICATIONS STARTED DURING THIS VISIT:  New Prescriptions   No medications on file  Note:  This document was prepared using Dragon voice recognition software and may include unintentional dictation errors.    Paulette Blanch, MD 07/15/16 [phone]

## 2016-07-15 NOTE — Clinical Social Work Note (Signed)
CSW received consult for pt as she was admitted from [facility]. Per chart review, pt has AMS likely due to UTI. CSW attempted to reach pt's daughter, Jimmy Picket, however was unable to leave a message due to mailbox being full. CSW will continue to attempt to reach daughter. CSW spoke with facility. Pt is normally alert and oriented. Pt ambulates with a walker and taking care of her ADLs. Pt is able to return to the facility when pt is medically stable. Full assessment to follow. CSW will continue to follow.   Darden Dates, MSW, LCSW  Clinical Social Worker  [phone]

## 2016-07-15 NOTE — Progress Notes (Signed)
Inpatient Diabetes Program Recommendations  AACE/ADA: New Consensus Statement on Inpatient Glycemic Control (2015)  Target Ranges:  Prepandial:   less than 140 mg/dL      Peak postprandial:   less than 180 mg/dL (1-2 hours)      Critically ill patients:  140 - 180 mg/dL   Results for CALA, KRUCKENBERG (MRN [id number]) as of 07/15/2016 09:33  Ref. Range 07/15/2016 05:59  Glucose-Capillary Latest Ref Range: 65 - 99 mg/dL 392 (H)    Admit with: UTI/ Pneumonia/ Hyperglycemia  History: DM, CHF  Home DM Meds: Levemir 25 units daily       Novolog 0-9 units per SSI (not taking)       Metformin 500 mg BID  Current Insulin Orders: Levemir 25 units BID        Novolog Sensitive Correction Scale/ SSI (0-9 units) TID AC + HS      MD- Note patient admitted with UTI/ Pneumonia/ Hyperglycemia.  CBG 392 mg/dl this AM.  Given 8 units Novolog.  Patient to start on Levemir at 10am today.  Agree with current orders.  Note Current Hemoglobin A1c results pending.     --Will follow patient during hospitalization--  Wyn Quaker RN, MSN, CDE Diabetes Coordinator Inpatient Glycemic Control Team Team Pager: [phone] (8a-5p)

## 2016-07-15 NOTE — Progress Notes (Signed)
Inpatient Diabetes Program Recommendations  AACE/ADA: New Consensus Statement on Inpatient Glycemic Control (2015)  Target Ranges:  Prepandial:   less than 140 mg/dL      Peak postprandial:   less than 180 mg/dL (1-2 hours)      Critically ill patients:  140 - 180 mg/dL   Results for Cristina [name], Cristina [name] (MRN [id number]) as of 07/15/2016 14:18  Ref. Range 07/15/2016 05:59 07/15/2016 11:21 07/15/2016 11:24  Glucose-Capillary Latest Ref Range: 65 - 99 mg/dL 392 (H) 493 (H) 536 (HH)    ALF DM Meds: Levemir 25 units daily (per ALF medication admin guide)   Metformin 500 mg BID  Current Insulin Orders: Levemir 25 units BID        Novolog Sensitive Correction Scale/ SSI (0-9 units) TID AC + HS     MD- Note pt with extremely elevated CBGs today.  Levemir started this AM.  Will get next dose of Levemir tonight at bedtime.  Please consider the following in-hospital insulin adjustments as well:  1. Increase Novolog SSI to Moderate scale (0-15 units) TID AC + HS  2. Start Novolog Meal Coverage: Novolog 4 units TID with meals (hold if pt eats <50% of meal)     --Will follow patient during hospitalization--  Wyn Quaker RN, MSN, CDE Diabetes Coordinator Inpatient Glycemic Control Team Team Pager: [phone] (8a-5p)

## 2016-07-15 NOTE — H&P (Signed)
[facility] [place] NAME: Cristina [name]    MR#:  [id number]  DATE OF BIRTH:  [birth year]  DATE OF ADMISSION:  07/15/2016  PRIMARY CARE PHYSICIAN: Denton Lank, MD   REQUESTING/REFERRING PHYSICIAN: dr sung  CHIEF COMPLAINT:   Hyperglycemia HISTORY OF PRESENT ILLNESS:  Cristina [name]  is a 56 y.o. female with a known history of Diabetes, chronic diastolic heart failure, COPD on 2 L of oxygen at night and OSA on CPAP at night who presents from assisted living facility due to hyperglycemia. Patient reportedly had a blood sugar reading 534. Patient was also complaining of a burning sensation and foul-smelling urine. Upon arrival to the emergency room her oxygen saturations were 80% and she had a rectal temperature of 100. Urine analysis reveals UTI and chest x-ray reveals pneumonia.  PAST MEDICAL HISTORY:   Past Medical History:  Diagnosis Date  . Abdominal hernia   . Arthritis   . Asthma   . Bladder dysfunction   . Carpal tunnel syndrome   . CHF (congestive heart failure) ([place])   . COPD (chronic obstructive pulmonary disease) ([place])   . DDD (degenerative disc disease), lumbar   . Diabetes mellitus without complication ([place])   . Dyspnea   . Edema   . Fatty tumor   . GERD (gastroesophageal reflux disease)   . H/O colonoscopy   . H/O tubal ligation    hx btl  . History of cholecystectomy    hx of cholecystectomy  . HLD (hyperlipidemia)   . Hypertension   . Muscle cramps   . Neuropathy   . Obesity   . Recurrent boils   . Schizoaffective disorder ([place])   . Sleep apnea   . Stroke ([place])   . UTI (lower urinary tract infection)   . Vaginitis     PAST SURGICAL HISTORY:   Past Surgical History:  Procedure Laterality Date  . CARDIAC CATHETERIZATION Left 09/24/2014   Procedure: Left Heart Cath and Coronary Angiography;  Surgeon: Dionisio David, MD;  Location: [facility];  Service: Cardiovascular;  Laterality: Left;  .  CHOLECYSTECTOMY    . COLONOSCOPY WITH PROPOFOL N/A 08/12/2015   Procedure: COLONOSCOPY WITH PROPOFOL;  Surgeon: Lollie Sails, MD;  Location: [facility];  Service: Endoscopy;  Laterality: N/A;  . ESOPHAGOGASTRODUODENOSCOPY (EGD) WITH PROPOFOL N/A 08/12/2015   Procedure: ESOPHAGOGASTRODUODENOSCOPY (EGD) WITH PROPOFOL;  Surgeon: Lollie Sails, MD;  Location: [facility] ENDOSCOPY;  Service: Endoscopy;  Laterality: N/A;  . tubal ligation      SOCIAL HISTORY:   Social History  Substance Use Topics  . Smoking status: Former Research scientist (life sciences)  . Smokeless tobacco: Never Used  . Alcohol use No    FAMILY HISTORY:   Family History  Problem Relation Age of Onset  . CAD Mother     DRUG ALLERGIES:   Allergies  Allergen Reactions  . Shellfish-Derived Products Anaphylaxis and Shortness Of Breath    Stated by Patient  . Codeine   . Thorazine [Chlorpromazine] Swelling  . Tylenol With Codeine #3 [Acetaminophen-Codeine] Other (See Comments)    Hallucinations    REVIEW OF SYSTEMS:   Review of Systems  Constitutional: Positive for fever. Negative for chills and malaise/fatigue.  HENT: Negative.  Negative for ear discharge, ear pain, hearing loss, nosebleeds and sore throat.   Eyes: Negative.  Negative for blurred vision and pain.  Respiratory: Positive for cough. Negative for hemoptysis, shortness of breath and wheezing.   Cardiovascular: Positive for leg  swelling. Negative for chest pain and palpitations.  Gastrointestinal: Negative.  Negative for abdominal pain, blood in stool, diarrhea, nausea and vomiting.  Genitourinary: Negative.  Negative for dysuria.  Musculoskeletal: Negative.  Negative for back pain.  Skin: Negative.   Neurological: Positive for weakness. Negative for dizziness, tremors, speech change, focal weakness, seizures and headaches.  Endo/Heme/Allergies: Negative.  Does not bruise/bleed easily.  Psychiatric/Behavioral: Negative.  Negative for depression, hallucinations and  suicidal ideas.    MEDICATIONS AT HOME:   Prior to Admission medications   Medication Sig Start Date End Date Taking? Authorizing Provider  acetaminophen (TYLENOL) 500 MG tablet Take 500 mg by mouth every 8 (eight) hours as needed for mild pain or moderate pain.   Yes [provider]  aspirin EC 81 MG tablet Take 81 mg by mouth daily.   Yes [provider]  atorvastatin (LIPITOR) 20 MG tablet Take 20 mg by mouth at bedtime.   Yes [provider]  cetirizine (ZYRTEC) 10 MG tablet Take 10 mg by mouth daily as needed for allergies.   Yes [provider]  clonazePAM (KLONOPIN) 0.5 MG tablet Take 1 tablet (0.5 mg total) by mouth 2 (two) times daily. Patient taking differently: Take 0.5-1 mg by mouth 2 (two) times daily. Take 0.5 mg by mouth in the morning and take 1 mg by mouth at bedtime. 10/31/15  Yes Jacqueline Delapena, Ulice Bold, MD  diphenoxylate-atropine (LOMOTIL) 2.5-0.025 MG tablet Take 1 tablet by mouth daily as needed for diarrhea or loose stools.   Yes [provider]  Fluticasone-Salmeterol (ADVAIR) 250-50 MCG/DOSE AEPB Inhale 1 puff into the lungs 2 (two) times daily.   Yes [provider]  furosemide (LASIX) 40 MG tablet Take 40 mg by mouth daily.   Yes [provider]  haloperidol (HALDOL) 5 MG tablet Take 1 tablet (5 mg total) by mouth at bedtime. 10/31/15  Yes Bettey Costa, MD  haloperidol decanoate (HALDOL DECANOATE) 100 MG/ML injection Inject 1 mL (100 mg total) into the muscle every 30 (thirty) days. 08/02/15  Yes Hildred Priest, MD  insulin detemir (LEVEMIR) 100 UNIT/ML injection Inject 0.25 mLs (25 Units total) into the skin 2 (two) times daily. Pt uses 50 units in the morning and 52 units at bedtime. Patient taking differently: Inject 25 Units into the skin daily.  10/31/15  Yes Kamron Vanwyhe, Ulice Bold, MD  meloxicam (MOBIC) 7.5 MG tablet Take 1 tablet (7.5 mg total) by mouth 2 (two) times daily. 08/02/15  Yes Hildred Priest, MD   metFORMIN (GLUCOPHAGE-XR) 500 MG 24 hr tablet Take 500 mg by mouth 2 (two) times daily. 05/23/15  Yes [provider]  metoprolol (LOPRESSOR) 50 MG tablet Take 50 mg by mouth 2 (two) times daily.   Yes [provider]  mirtazapine (REMERON) 45 MG tablet Take 1 tablet (45 mg total) by mouth at bedtime. 10/31/15  Yes Sivan Quast, Ulice Bold, MD  nitroGLYCERIN (NITROSTAT) 0.3 MG SL tablet Place 0.3 mg under the tongue every 5 (five) minutes as needed for chest pain.   Yes [provider]  ondansetron (ZOFRAN) 4 MG tablet Take 4 mg by mouth every 6 (six) hours as needed for nausea or vomiting.   Yes [provider]  pantoprazole (PROTONIX) 40 MG tablet Take 40 mg by mouth 2 (two) times daily.   Yes [provider]  pregabalin (LYRICA) 100 MG capsule Take 100 mg by mouth 3 (three) times daily.   Yes [provider]  umeclidinium-vilanterol (ANORO ELLIPTA) 62.5-25 MCG/INH AEPB Inhale 1  puff into the lungs daily.   Yes [provider]  venlafaxine XR (EFFEXOR-XR) 150 MG 24 hr capsule Take 150 mg by mouth daily.   Yes [provider]  venlafaxine XR (EFFEXOR-XR) 75 MG 24 hr capsule Take 75 mg by mouth daily with breakfast.   Yes [provider]  albuterol (PROVENTIL HFA;VENTOLIN HFA) 108 (90 BASE) MCG/ACT inhaler Inhale 2 puffs into the lungs every 6 (six) hours as needed for wheezing or shortness of breath.    [provider]  insulin aspart (NOVOLOG) 100 UNIT/ML injection Inject 0-9 Units into the skin 3 (three) times daily with meals. CBG 70 - 120: 0 units CBG 121 - 150: 0 units CBG 151 - 200: 0 units CBG 201 - 250: 2 units CBG 251 - 300: 3 units CBG 301 - 350: 4 units CBG 351 - 400: 5 units Patient not taking: Reported on 07/15/2016 10/31/15   Bettey Costa, MD      VITAL SIGNS:  Blood pressure (!) 106/47, pulse (!) 106, temperature (!) 102 F (38.9 C), temperature source Rectal, resp. rate (!) 27, weight 116.7 kg (257 lb  4.8 oz), SpO2 95 %.  PHYSICAL EXAMINATION:   Physical Exam  Constitutional: She is oriented to person, place, and time and well-developed, well-nourished, and in no distress. No distress.  HENT:  Head: Normocephalic.  Eyes: No scleral icterus.  Neck: Normal range of motion. Neck supple. No JVD present. No tracheal deviation present.  Cardiovascular: Normal rate, regular rhythm and normal heart sounds.  Exam reveals no gallop and no friction rub.   No murmur heard. Pulmonary/Chest: Effort normal. No respiratory distress. She has no wheezes. She has rales. She exhibits no tenderness.  Abdominal: Soft. Bowel sounds are normal. She exhibits no distension and no mass. There is no tenderness. There is no rebound and no guarding.  Musculoskeletal: Normal range of motion. She exhibits edema.  Neurological: She is alert and oriented to person, place, and time.  Skin: Skin is warm. No rash noted. No erythema.  Psychiatric: Affect and judgment normal.      LABORATORY PANEL:   CBC  Recent Labs Lab 07/15/16 0554  WBC 10.6  HGB 8.9*  HCT 27.2*  PLT 169   - of 102-----------------------------------------------------------------------------------------------------------------  Chemistries   Recent Labs Lab 07/15/16 0554  NA 134*  K 3.9  CL 95*  CO2 28  GLUCOSE 416*  BUN 26*  CREATININE 1.10*  CALCIUM 7.9*  AST 92*  ALT 58*  ALKPHOS 149*  BILITOT 0.7   ------------------------------------------------------------------------------------------------------------------  Cardiac Enzymes No results for input(s): TROPONINI in the last 168 hours. ------------------------------------------------------------------------------------------------------------------  RADIOLOGY:  Dg Chest Port 1 View  Result Date: 07/15/2016 CLINICAL DATA:  Acute onset of hyperglycemia and decreased O2 saturation. Initial encounter. EXAM: PORTABLE CHEST 1 VIEW COMPARISON:  Chest radiograph from  06/16/2016 FINDINGS: The lungs are well-aerated. Mild bibasilar airspace opacities raise concern for pneumonia. There is no evidence of pleural effusion or pneumothorax. The cardiomediastinal silhouette is mildly enlarged. No acute osseous abnormalities are seen. IMPRESSION: Mild bibasilar airspace opacities raise concern for pneumonia. Mild cardiomegaly. Electronically Signed   By: Garald Balding M.D.   On: 07/15/2016 06:11    EKG:   Sinus rhythm no ST elevation or depression  IMPRESSION AND PLAN:   56 year old female with morbid obesity, OSA on CPAP, chronic diastolic heart failure, COPD with 2 L of oxygen at night  and diabetes who Presented initially with hyperglycemia now found to have sepsis.  1. Sepsis: Patient presents with fever of 102, hypoxia, tachypnea and tachycardia due to HCAP and UTI. Patient given gentle IVF in ED Follow up final cultures  2. HCAP: Treat with Cefepime and Vanc. Check MRSA PCR if negative consider d/c VANC  3. UTI: Cefepime and follow urine culture.  4. Acute on chronic diastolic heart failure with LEE: One dose of IV Lasix given in ED. Continue with IV Lasix and follow weights and I/o. Cardiology consult  5. OSA: CPAP ordered 6. COPD no signs of exacerbation 7. AKI: from sepsis and CHF Will monitor creatinine.  8. Uncontrolled DM from sepsis Diabetes coordinator consult. SSI and ADA diet along with outpatient long acting insulin. hga1c ordered as well.   PT and CSW consult placed for dispo.   All the records are reviewed and case discussed with ED provider. Management plans discussed with the patient and she in agreement  CODE STATUS: FULL  TOTAL TIME TAKING CARE OF THIS PATIENT: 45 minutes.    Shanah Guimaraes M.D on 07/15/2016 at 6:58 AM  Between 7am to 6pm - Pager - [phone]  After 6pm go to [email or web address] - password [email or web address] [facility]  Office  [phone]  CC: Primary care physician; Denton Lank,  MD

## 2016-07-15 NOTE — Progress Notes (Addendum)
Pharmacy Antibiotic Note  Tyrell Brereton is a 56 y.o. female admitted on 07/15/2016 with HCAP.  Pharmacy has been consulted for vancomycin and cefepime dosing.  Plan: DW 73 kg  Vd 51L kei 0.058 hr-1  T1/2 12 hours Vancomycin 1 gram q 12 hours ordered with stacked dosing. Level before 5th dose. Goal trough 15-20.  Cefepime 2 grams q 8 hours ordered.  Weight: 257 lb 4.8 oz (116.7 kg)  Temp (24hrs), Avg:101 F (38.3 C), Min:100 F (37.8 C), Max:102 F (38.9 C)   Recent Labs Lab 07/15/16 0554  WBC 10.6  CREATININE 1.10*  LATICACIDVEN 1.4    Estimated Creatinine Clearance: 63.9 mL/min (A) (by C-G formula based on SCr of 1.1 mg/dL (H)).    Allergies  Allergen Reactions  . Shellfish-Derived Products Anaphylaxis and Shortness Of Breath    Stated by Patient  . Codeine   . Thorazine [Chlorpromazine] Swelling  . Tylenol With Codeine #3 [Acetaminophen-Codeine] Other (See Comments)    Hallucinations    Antimicrobials this admission: vancomycin cefepime 5/21 >>    >>   Dose adjustments this admission:   Microbiology results: 5/31 BCx: pending 5/31 BCID GPC 1/4. Staph spp. mecA (-) Pt febrile this AM. No change in abx. 6/1 BCID GPR- no organism identified. No change in abx per hospitalist.     5/31 CXR: bibasilar opacities 5/31 UA: pending  Thank you for allowing pharmacy to be a part of this patient's care.  Smera Guyette S 07/15/2016 6:45 AM

## 2016-07-15 NOTE — Progress Notes (Signed)
Anticoagulation monitoring(Lovenox):  56yo  female ordered Lovenox 40 mg Q24h  Filed Weights   07/15/16 0552  Weight: 257 lb 4.8 oz (116.7 kg)   BMI 51.9   Lab Results  Component Value Date   CREATININE 1.10 (H) 07/15/2016   CREATININE 0.37 (L) 06/16/2016   CREATININE 2.12 (H) 10/29/2015   Estimated Creatinine Clearance: 63.9 mL/min (A) (by C-G formula based on SCr of 1.1 mg/dL (H)). Hemoglobin & Hematocrit     Component Value Date/Time   HGB 8.9 (L) 07/15/2016 0554   HGB 10.4 (L) 02/18/2014 0452   HCT 27.2 (L) 07/15/2016 0554   HCT 32.2 (L) 02/18/2014 8421     Per Protocol for Patient with estCrcl > 30 ml/min and BMI > 40, will transition to Lovenox 40 mg Q12h.

## 2016-07-15 NOTE — ED Triage Notes (Signed)
Pt arrived via ems from [facility]. EMS reports initial call was for hyperglycemia with a blood sugar reading of 534. EMS reports possible UTI due to reports of burning sensation and foul smelling urine. EMS vitals WDL. Upon arrival pt's initial O2 80%, pt placed on 2L via nasal canula with an increase in o2 to 94%. Pt's temp 100.0 oral.

## 2016-07-16 LAB — BLOOD CULTURE ID PANEL (REFLEXED)

## 2016-07-16 LAB — CBC
HCT: 25.7 % — ABNORMAL LOW (ref 35.0–47.0)
Hemoglobin: 8.3 g/dL — ABNORMAL LOW (ref 12.0–16.0)
MCH: 23.4 pg — ABNORMAL LOW (ref 26.0–34.0)
MCHC: 32.4 g/dL (ref 32.0–36.0)
MCV: 72.1 fL — ABNORMAL LOW (ref 80.0–100.0)
Platelets: 178 10*3/uL (ref 150–440)
RBC: 3.57 MIL/uL — ABNORMAL LOW (ref 3.80–5.20)
RDW: 17.4 % — ABNORMAL HIGH (ref 11.5–14.5)
WBC: 8.5 10*3/uL (ref 3.6–11.0)

## 2016-07-16 LAB — HEMOGLOBIN A1C
Hgb A1c MFr Bld: 10.4 % — ABNORMAL HIGH (ref 4.8–5.6)
Mean Plasma Glucose: 252 mg/dL

## 2016-07-16 LAB — BASIC METABOLIC PANEL
Anion gap: 9 (ref 5–15)
BUN: 19 mg/dL (ref 6–20)
CO2: 30 mmol/L (ref 22–32)
Calcium: 7.6 mg/dL — ABNORMAL LOW (ref 8.9–10.3)
Chloride: 97 mmol/L — ABNORMAL LOW (ref 101–111)
Creatinine, Ser: 0.87 mg/dL (ref 0.44–1.00)
GFR calc Af Amer: >60 mL/min (ref >60)
GFR calc non Af Amer: >60 mL/min (ref >60)
Glucose, Bld: 267 mg/dL — ABNORMAL HIGH (ref 65–99)
Potassium: 3.6 mmol/L (ref 3.5–5.1)
Sodium: 136 mmol/L (ref 135–145)

## 2016-07-16 LAB — GLUCOSE, CAPILLARY
Glucose-Capillary: 258 mg/dL — ABNORMAL HIGH (ref 65–99)
Glucose-Capillary: 291 mg/dL — ABNORMAL HIGH (ref 65–99)
Glucose-Capillary: 295 mg/dL — ABNORMAL HIGH (ref 65–99)
Glucose-Capillary: 305 mg/dL — ABNORMAL HIGH (ref 65–99)

## 2016-07-16 LAB — HIV ANTIBODY (ROUTINE TESTING W REFLEX): HIV Screen 4th Generation wRfx: NONREACTIVE

## 2016-07-16 MED ORDER — INSULIN ASPART 100 UNIT/ML ~~LOC~~ SOLN
0.0000 [IU] | Freq: Three times a day (TID) | SUBCUTANEOUS | Status: DC
  Administered 2016-07-16 (×3): 8 [IU] via SUBCUTANEOUS
  Administered 2016-07-17 (×2): 5 [IU] via SUBCUTANEOUS
  Administered 2016-07-17: 12:00:00 18 [IU] via SUBCUTANEOUS
  Administered 2016-07-18: 15 [IU] via SUBCUTANEOUS
  Filled 2016-07-16: qty 18
  Filled 2016-07-16: qty 8
  Filled 2016-07-16: qty 5
  Filled 2016-07-16: qty 15
  Filled 2016-07-16: qty 5
  Filled 2016-07-16: qty 8

## 2016-07-16 MED ORDER — INSULIN ASPART 100 UNIT/ML ~~LOC~~ SOLN: 0.0000 [IU] | Freq: Three times a day (TID) | SUBCUTANEOUS | Status: DC

## 2016-07-16 MED ORDER — FUROSEMIDE 20 MG PO TABS
20.0000 mg | ORAL_TABLET | Freq: Two times a day (BID) | ORAL | Status: DC
  Administered 2016-07-16 – 2016-07-18 (×4): 20 mg via ORAL
  Filled 2016-07-16 (×4): qty 1

## 2016-07-16 MED ORDER — INSULIN DETEMIR 100 UNIT/ML ~~LOC~~ SOLN
30.0000 [IU] | Freq: Two times a day (BID) | SUBCUTANEOUS | Status: DC
  Administered 2016-07-16 – 2016-07-17 (×2): 30 [IU] via SUBCUTANEOUS
  Filled 2016-07-16 (×3): qty 0.3

## 2016-07-16 NOTE — Evaluation (Signed)
Physical Therapy Evaluation Patient Details Name: Cristina [name] MRN: [id number] DOB: 04-Feb-1961 Today's Date: 07/16/2016   History of Present Illness  Pt is a 56 y.o. female presenting to hospital with AMS, hyperglycemia, and hypoxic.  Pt admitted with sepsis d/t HCAP and UTI.  PMH includes COPD, CHF, DM, schizoaffective disorder, frequent UTI's, abdominal hernia, CTS, and OSA on CPAP.  Clinical Impression  Prior to hospital admission, pt was modified independent ambulating with rollator.  Pt lives at [facility].  Currently pt requires significant assist supine to sit (pt reporting trapeze type system set-up at home that works better for her d/t h/o difficulty with bed mobility); pt also CGA with transfers and ambulation 50 feet with RW.  Pt reporting feeling "drunk" and appearing mildly unsteady in general with ambulation (PT providing CGA for safety) but no overt loss of balance noted during session.  Pt would benefit from skilled PT to address noted impairments and functional limitations (see below for any additional details).  Upon hospital discharge, recommend pt discharge back to ALF with HHPT.    Follow Up Recommendations Home health PT;Supervision for mobility/OOB    Equipment Recommendations  Rolling walker with 5" wheels    Recommendations for Other Services       Precautions / Restrictions Precautions Precautions: Fall Restrictions Weight Bearing Restrictions: No      Mobility  Bed Mobility Overal bed mobility: Needs Assistance Bed Mobility: Supine to Sit     Supine to sit: Max assist;HOB elevated     General bed mobility comments: use of bedrail; increased effort and time to perform;  assist for trunk and B LE's; pt appears to have trapeze type system at home to pull up on per pt's description d/t h/o difficulty with bed mobility  Transfers Overall transfer level: Needs assistance Equipment used: Rolling walker (2 wheeled) Transfers: Sit to/from Stand Sit to  Stand: Min guard         General transfer comment: fairly strong stand; steady without loss of balance  Ambulation/Gait Ambulation/Gait assistance: Min guard Ambulation Distance (Feet): 50 Feet Assistive device: Rolling walker (2 wheeled)   Gait velocity: decreased   General Gait Details: decreased B step length; mildly unsteady (pt reporting feeling "drunk") but no overt loss of balance noted  Stairs            Wheelchair Mobility    Modified Rankin (Stroke Patients Only)       Balance Overall balance assessment: Needs assistance Sitting-balance support: Bilateral upper extremity supported;Feet supported Sitting balance-[name]: Good Sitting balance - Comments: sitting reaching within BOS   Standing balance support: Bilateral upper extremity supported (on RW) Standing balance-[name]: Good Standing balance comment: dynamic standing activities within BOS                             Pertinent Vitals/Pain Pain Assessment: No/denies pain  Vitals (HR and O2 on 2 L via nasal cannula) stable and WFL throughout treatment session.    Home Living Family/patient expects to be discharged to:: Assisted living               Home Equipment: Walker - 4 wheels Additional Comments: Lives at [facility].    Prior Function Level of Independence: Independent with assistive device(s)         Comments: Pt reports ambulating with rollator and takes care of her own ADL's (except has assist for showers).  Uses O2 at night.  Pt  denies any falls in past 6 months.     Hand Dominance        Extremity/Trunk Assessment   Upper Extremity Assessment Upper Extremity Assessment: Generalized weakness    Lower Extremity Assessment Lower Extremity Assessment: Generalized weakness    Cervical / Trunk Assessment Cervical / Trunk Assessment: Kyphotic  Communication   Communication: No difficulties  Cognition Arousal/Alertness: Awake/alert Behavior  During Therapy: WFL for tasks assessed/performed Overall Cognitive Status: Within Functional Limits for tasks assessed (A&Ox4)                                        General Comments General comments (skin integrity, edema, etc.): Nursing removed external urinary catheter prior to PT session.  Nursing cleared pt for participation in physical therapy.  Pt agreeable to PT session.    Exercises Total Joint Exercises Ankle Circles/Pumps: AROM;Strengthening;Both;10 reps;Supine Heel Slides: AROM;Strengthening;Both;10 reps;Supine Hip ABduction/ADduction: AAROM;Strengthening;Both;10 reps;Supine  Vc's required for above exercise technique.   Assessment/Plan    PT Assessment Patient needs continued PT services  PT Problem List Decreased strength;Decreased balance;Decreased mobility       PT Treatment Interventions DME instruction;Gait training;Functional mobility training;Therapeutic activities;Therapeutic exercise;Balance training;Patient/family education    PT Goals (Current goals can be found in the Care Plan section)  Acute Rehab PT Goals Patient Stated Goal: to go home PT Goal Formulation: With patient Time For Goal Achievement: 07/30/16 Potential to Achieve Goals: Good    Frequency Min 2X/week   Barriers to discharge        Co-evaluation               AM-PAC PT "6 Clicks" Daily Activity  Outcome Measure Difficulty turning over in bed (including adjusting bedclothes, sheets and blankets)?: Total Difficulty moving from lying on back to sitting on the side of the bed? : Total Difficulty sitting down on and standing up from a chair with arms (e.g., wheelchair, bedside commode, etc,.)?: Total Help needed moving to and from a bed to chair (including a wheelchair)?: A Little Help needed walking in hospital room?: A Little Help needed climbing 3-5 steps with a railing? : Total 6 Click Score: 10    End of Session Equipment Utilized During Treatment: Gait  belt;Oxygen (2 L O2 via nasal cannula) Activity Tolerance: Patient tolerated treatment well Patient left: in chair;with call bell/phone within reach;with chair alarm set Nurse Communication: Mobility status;Precautions (Pt's O2 status during session) PT Visit Diagnosis: Unsteadiness on feet (R26.81);Muscle weakness (generalized) (M62.81);Difficulty in walking, not elsewhere classified (R26.2)    Time: 7121-9758 PT Time Calculation (min) (ACUTE ONLY): 29 min   Charges:   PT Evaluation $PT Eval Low Complexity: 1 Procedure PT Treatments $Therapeutic Activity: 8-22 mins   PT G CodesLeitha Bleak, PT 07/16/16, 11:48 AM [phone]

## 2016-07-16 NOTE — Progress Notes (Signed)
[facility] NAME: Jenness Stemler    MR#:  [id number]  DATE OF BIRTH:  01-23-1961  SUBJECTIVE:  CHIEF COMPLAINT:  Patient's shortness of breath is better  REVIEW OF SYSTEMS:  CONSTITUTIONAL: No fever, fatigue or weakness.  EYES: No blurred or double vision.  EARS, NOSE, AND THROAT: No tinnitus or ear pain.  RESPIRATORY:Reports some cough, shortness of breath, denies wheezing or hemoptysis.  CARDIOVASCULAR: No chest pain, orthopnea, edema.  GASTROINTESTINAL: No nausea, vomiting, diarrhea or abdominal pain.  GENITOURINARY: No dysuria, hematuria.  ENDOCRINE: No polyuria, nocturia,  HEMATOLOGY: No anemia, easy bruising or bleeding SKIN: No rash or lesion. MUSCULOSKELETAL: No joint pain or arthritis.   NEUROLOGIC: No tingling, numbness, weakness.  PSYCHIATRY: No anxiety or depression.   DRUG ALLERGIES:   Allergies  Allergen Reactions  . Shellfish-Derived Products Anaphylaxis and Shortness Of Breath    Stated by Patient  . Codeine   . Thorazine [Chlorpromazine] Swelling  . Tylenol With Codeine #3 [Acetaminophen-Codeine] Other (See Comments)    Hallucinations    VITALS:  Blood pressure 109/60, pulse 83, temperature 98.7 F (37.1 C), temperature source Oral, resp. rate 18, height 4\' 11"  (1.499 m), weight 114 kg (251 lb 6.4 oz), SpO2 98 %.  PHYSICAL EXAMINATION:  GENERAL:  56 y.o.-year-old patient lying in the bed with no acute distress.  EYES: Pupils equal, round, reactive to light and accommodation. No scleral icterus. Extraocular muscles intact.  HEENT: Head atraumatic, normocephalic. Oropharynx and nasopharynx clear.  NECK:  Supple, no jugular venous distention. No thyroid enlargement, no tenderness.  LUNGS: Moderate breath sounds bilaterally, no wheezing, rales,rhonchi or crepitation. No use of accessory muscles of respiration.  CARDIOVASCULAR: S1, S2 normal. No murmurs, rubs, or gallops.  ABDOMEN: Soft, nontender,  nondistended. Bowel sounds present. No organomegaly or mass.  EXTREMITIES: No pedal edema, cyanosis, or clubbing.  NEUROLOGIC: Cranial nerves II through XII are intact. Muscle strength 5/5 in all extremities. Sensation intact. Gait not checked.  PSYCHIATRIC: The patient is alert and oriented x 3.  SKIN: No obvious rash, lesion, or ulcer.    LABORATORY PANEL:   CBC  Recent Labs Lab 07/16/16 0654  WBC 8.5  HGB 8.3*  HCT 25.7*  PLT 178   ------------------------------------------------------------------------------------------------------------------  Chemistries   Recent Labs Lab 07/15/16 0554  07/16/16 0654  NA 134*  --  136  K 3.9  --  3.6  CL 95*  --  97*  CO2 28  --  30  GLUCOSE 416*  < > 267*  BUN 26*  --  19  CREATININE 1.10*  --  0.87  CALCIUM 7.9*  --  7.6*  AST 92*  --   --   ALT 58*  --   --   ALKPHOS 149*  --   --   BILITOT 0.7  --   --   < > = values in this interval not displayed. ------------------------------------------------------------------------------------------------------------------  Cardiac Enzymes No results for input(s): TROPONINI in the last 168 hours. ------------------------------------------------------------------------------------------------------------------  RADIOLOGY:  Dg Chest Port 1 View  Result Date: 07/15/2016 CLINICAL DATA:  Acute onset of hyperglycemia and decreased O2 saturation. Initial encounter. EXAM: PORTABLE CHEST 1 VIEW COMPARISON:  Chest radiograph from 06/16/2016 FINDINGS: The lungs are well-aerated. Mild bibasilar airspace opacities raise concern for pneumonia. There is no evidence of pleural effusion or pneumothorax. The cardiomediastinal silhouette is mildly enlarged. No acute osseous abnormalities are seen. IMPRESSION: Mild bibasilar airspace opacities raise concern for pneumonia. Mild  cardiomegaly. Electronically Signed   By: Garald Balding M.D.   On: 07/15/2016 06:11    EKG:   Orders placed or performed  during the hospital encounter of 07/15/16  . EKG 12-Lead  . EKG 12-Lead  . ED EKG 12-Lead  . ED EKG 12-Lead  . EKG 12-Lead  . EKG 12-Lead    ASSESSMENT AND PLAN:     56 year old female with morbid obesity, OSA on CPAP, chronic diastolic heart failure, COPD with 2 L of oxygen at night  and diabetes who Presented initially with hyperglycemia now found to have sepsis.   1. Sepsis: Patient presents with fever of 102, hypoxia, tachypnea and tachycardia due to HCAP and UTI. Patient given gentle IVF in ED Follow up final cultures  2. HCAP: Continue cefepime. MRSA PCR negative d/c VANC  3. UTI: Cefepime and follow urine culture.  4. Acute on chronic diastolic heart failure with LEE: One dose of IV Lasix given in ED. Continue with IV Lasix and follow weights and I/o., Changed to 20 by mouth once daily Echocardiogram ordered Cardiology consult- khan  5. OSA: CPAP ordered 6. COPD no signs of exacerbation 7. AKI: from sepsis and CHF Renal function improved. Avoid nephrotoxins  8. Uncontrolled DM from sepsis Diabetes coordinator consult. SSI and ADA diet along with outpatient long acting insulin. hga1c 10.4    All the records are reviewed and case discussed with Care Management/Social Workerr. Management plans discussed with the patient, family and they are in agreement.  CODE STATUS: fc   TOTAL TIME TAKING CARE OF THIS PATIENT: 36  minutes.   POSSIBLE D/C IN 2  DAYS, DEPENDING ON CLINICAL CONDITION.  Note: This dictation was prepared with Dragon dictation along with smaller phrase technology. Any transcriptional errors that result from this process are unintentional.   Nicholes Mango M.D on 07/16/2016 at 2:46 PM  Between 7am to 6pm - Pager - [phone] After 6pm go to [email or web address] - password [email or web address] [facility]  [facility]  Office  [phone]  CC: Primary care physician; Denton Lank, MD

## 2016-07-16 NOTE — Progress Notes (Signed)
Inpatient Diabetes Program Recommendations  AACE/ADA: New Consensus Statement on Inpatient Glycemic Control (2015)  Target Ranges:  Prepandial:   less than 140 mg/dL      Peak postprandial:   less than 180 mg/dL (1-2 hours)      Critically ill patients:  140 - 180 mg/dL   Lab Results  Component Value Date   GLUCAP 291 (H) 07/16/2016   HGBA1C 10.4 (H) 07/15/2016  Results for Cristina, [name] (MRN [id number]) as of 07/16/2016 14:27  Ref. Range 07/15/2016 11:24 07/15/2016 16:34 07/15/2016 20:20 07/16/2016 07:55 07/16/2016 11:57  Glucose-Capillary Latest Ref Range: 65 - 99 mg/dL 536 (HH) 399 (H) 293 (H) 258 (H) 291 (H)  Admit with: UTI/ Pneumonia/ Hyperglycemia  History: DM, CHF  Home DM Meds: Levemir 25 units daily                             Novolog 0-9 units per SSI (not taking)                             Metformin 500 mg BID  Current Insulin Orders: Levemir 25 units BID                                       Novolog Sensitive Correction Scale/ SSI (0-9 units) TID AC + HS Inpatient Diabetes Program:   Note A1C indicates average blood sugars for past 2-3 months approximately 252 mg/dL.  Called patient by phone to discuss A1C results.  She states that she does not recall her last A1C but states she knows it is "real bad". Based on high A1C, patient likely needs changes in home diabetes medications.  MD, please consider also adding Novolog meal coverage 6 units tid with meals (hold if patient eats less than 50%).   Thanks, Adah Perl, RN, BC-ADM Inpatient Diabetes Coordinator Pager [phone] (8a-5p)

## 2016-07-16 NOTE — Consult Note (Signed)
Cristina [name] is a 56 y.o. female  [id number]  Primary Cardiologist: Neoma Laming Reason for Consultation:CHF  HPI: 51 YOWF presented with fever and chills and has h/O CHF, thus asked to evaluate the patient. She denies chest pain, but has shortness of breath, and occasional PND/Orthopnea.   Review of Systems: No chest pain   Past Medical History:  Diagnosis Date  . Abdominal hernia   . Arthritis   . Asthma   . Bladder dysfunction   . Carpal tunnel syndrome   . CHF (congestive heart failure) ([place])   . COPD (chronic obstructive pulmonary disease) ([place])   . DDD (degenerative disc disease), lumbar   . Diabetes mellitus without complication ([place])   . Dyspnea   . Edema   . Fatty tumor   . GERD (gastroesophageal reflux disease)   . H/O colonoscopy   . H/O tubal ligation    hx btl  . History of cholecystectomy    hx of cholecystectomy  . HLD (hyperlipidemia)   . Hypertension   . Muscle cramps   . Neuropathy   . Obesity   . Recurrent boils   . Schizoaffective disorder ([place])   . Sleep apnea   . Stroke ([place])   . UTI (lower urinary tract infection)   . Vaginitis     Medications Prior to Admission  Medication Sig Dispense Refill  . acetaminophen (TYLENOL) 500 MG tablet Take 500 mg by mouth every 8 (eight) hours as needed for mild pain or moderate pain.    Marland Kitchen aspirin EC 81 MG tablet Take 81 mg by mouth daily.    Marland Kitchen atorvastatin (LIPITOR) 20 MG tablet Take 20 mg by mouth at bedtime.    . cetirizine (ZYRTEC) 10 MG tablet Take 10 mg by mouth daily as needed for allergies.    . clonazePAM (KLONOPIN) 0.5 MG tablet Take 1 tablet (0.5 mg total) by mouth 2 (two) times daily. (Patient taking differently: Take 0.5-1 mg by mouth 2 (two) times daily. Take 0.5 mg by mouth in the morning and take 1 mg by mouth at bedtime.) 30 tablet 0  . diphenoxylate-atropine (LOMOTIL) 2.5-0.025 MG tablet Take 1 tablet by mouth daily as needed for diarrhea or loose stools.    . Fluticasone-Salmeterol  (ADVAIR) 250-50 MCG/DOSE AEPB Inhale 1 puff into the lungs 2 (two) times daily.    . furosemide (LASIX) 40 MG tablet Take 40 mg by mouth daily.    . haloperidol (HALDOL) 5 MG tablet Take 1 tablet (5 mg total) by mouth at bedtime. 30 tablet 0  . haloperidol decanoate (HALDOL DECANOATE) 100 MG/ML injection Inject 1 mL (100 mg total) into the muscle every 30 (thirty) days. 1 mL 0  . insulin detemir (LEVEMIR) 100 UNIT/ML injection Inject 0.25 mLs (25 Units total) into the skin 2 (two) times daily. Pt uses 50 units in the morning and 52 units at bedtime. (Patient taking differently: Inject 25 Units into the skin daily. ) 10 mL 11  . meloxicam (MOBIC) 7.5 MG tablet Take 1 tablet (7.5 mg total) by mouth 2 (two) times daily. 60 tablet 0  . metFORMIN (GLUCOPHAGE-XR) 500 MG 24 hr tablet Take 500 mg by mouth 2 (two) times daily.    . metoprolol (LOPRESSOR) 50 MG tablet Take 50 mg by mouth 2 (two) times daily.    . mirtazapine (REMERON) 45 MG tablet Take 1 tablet (45 mg total) by mouth at bedtime. 60 tablet 0  . nitroGLYCERIN (NITROSTAT) 0.3 MG SL tablet Place  0.3 mg under the tongue every 5 (five) minutes as needed for chest pain.    Marland Kitchen ondansetron (ZOFRAN) 4 MG tablet Take 4 mg by mouth every 6 (six) hours as needed for nausea or vomiting.    . pantoprazole (PROTONIX) 40 MG tablet Take 40 mg by mouth 2 (two) times daily.    . pregabalin (LYRICA) 100 MG capsule Take 100 mg by mouth 3 (three) times daily.    Marland Kitchen umeclidinium-vilanterol (ANORO ELLIPTA) 62.5-25 MCG/INH AEPB Inhale 1 puff into the lungs daily.    Marland Kitchen venlafaxine XR (EFFEXOR-XR) 150 MG 24 hr capsule Take 150 mg by mouth daily.    Marland Kitchen venlafaxine XR (EFFEXOR-XR) 75 MG 24 hr capsule Take 75 mg by mouth daily with breakfast.    . albuterol (PROVENTIL HFA;VENTOLIN HFA) 108 (90 BASE) MCG/ACT inhaler Inhale 2 puffs into the lungs every 6 (six) hours as needed for wheezing or shortness of breath.    . insulin aspart (NOVOLOG) 100 UNIT/ML injection Inject 0-9  Units into the skin 3 (three) times daily with meals. CBG 70 - 120: 0 units CBG 121 - 150: 0 units CBG 151 - 200: 0 units CBG 201 - 250: 2 units CBG 251 - 300: 3 units CBG 301 - 350: 4 units CBG 351 - 400: 5 units (Patient not taking: Reported on 07/15/2016) 10 mL 11     . aspirin EC  81 mg Oral Daily  . atorvastatin  20 mg Oral QHS  . budesonide (PULMICORT) nebulizer solution  0.5 mg Nebulization BID  . clonazePAM  0.5 mg Oral BID  . enoxaparin (LOVENOX) injection  40 mg Subcutaneous BID  . furosemide  20 mg Intravenous Q12H  . furosemide  40 mg Intravenous Once  . haloperidol  5 mg Oral QHS  . insulin aspart  0-15 Units Subcutaneous TID WC  . insulin aspart  0-5 Units Subcutaneous QHS  . insulin detemir  25 Units Subcutaneous BID  . loratadine  10 mg Oral Daily  . metoprolol tartrate  50 mg Oral BID  . mirtazapine  45 mg Oral QHS  . pantoprazole  40 mg Oral BID AC  . pregabalin  100 mg Oral TID  . umeclidinium-vilanterol  1 puff Inhalation Daily  . venlafaxine XR  225 mg Oral Q breakfast    Infusions: . ceFEPime (MAXIPIME) IV Stopped (07/16/16 2505)  . vancomycin Stopped (07/16/16 0329)    Allergies  Allergen Reactions  . Shellfish-Derived Products Anaphylaxis and Shortness Of Breath    Stated by Patient  . Codeine   . Thorazine [Chlorpromazine] Swelling  . Tylenol With Codeine #3 [Acetaminophen-Codeine] Other (See Comments)    Hallucinations    Social History   Social History  . Marital status: Single    Spouse name: N/A  . Number of children: N/A  . Years of education: N/A   Occupational History  . Not on file.   Social History Main Topics  . Smoking status: Former Research scientist (life sciences)  . Smokeless tobacco: Never Used  . Alcohol use No  . Drug use: No  . Sexual activity: Not on file   Other Topics Concern  . Not on file   Social History Narrative  . No narrative on file    Family History  Problem Relation Age of Onset  . CAD Mother     PHYSICAL  EXAM: Vitals:   07/16/16 0452 07/16/16 0600  BP: 116/60   Pulse: 92   Resp: 20   Temp: (!) 101.1 F (  38.4 C) 98.5 F (36.9 C)     Intake/Output Summary (Last 24 hours) at 07/16/16 0952 Last data filed at 07/16/16 0948  Gross per 24 hour  Intake              930 ml  Output              950 ml  Net              -20 ml    General:  Well appearing. No respiratory difficulty HEENT: normal Neck: supple. no JVD. Carotids 2+ bilat; no bruits. No lymphadenopathy or thryomegaly appreciated. Cor: PMI nondisplaced. Regular rate & rhythm. No rubs, gallops or murmurs. Lungs: clear Abdomen: soft, nontender, nondistended. No hepatosplenomegaly. No bruits or masses. Good bowel sounds. Extremities: no cyanosis, clubbing, rash, edema Neuro: alert & oriented x 3, cranial nerves grossly intact. moves all 4 extremities w/o difficulty. Affect pleasant.  ECG: Sinus tachycardia with RBB, non-specific st and t changes  Results for orders placed or performed during the hospital encounter of 07/15/16 (from the past 24 hour(s))  Glucose, capillary     Status: Abnormal   Collection Time: 07/15/16 11:21 AM  Result Value Ref Range   Glucose-Capillary 493 (H) 65 - 99 mg/dL  Glucose, capillary     Status: Abnormal   Collection Time: 07/15/16 11:24 AM  Result Value Ref Range   Glucose-Capillary 536 (HH) 65 - 99 mg/dL  Glucose, random     Status: Abnormal   Collection Time: 07/15/16 11:52 AM  Result Value Ref Range   Glucose, Bld 539 (HH) 65 - 99 mg/dL  Glucose, capillary     Status: Abnormal   Collection Time: 07/15/16  4:34 PM  Result Value Ref Range   Glucose-Capillary 399 (H) 65 - 99 mg/dL  Glucose, capillary     Status: Abnormal   Collection Time: 07/15/16  8:20 PM  Result Value Ref Range   Glucose-Capillary 293 (H) 65 - 99 mg/dL  Basic metabolic panel     Status: Abnormal   Collection Time: 07/16/16  6:54 AM  Result Value Ref Range   Sodium 136 135 - 145 mmol/L   Potassium 3.6 3.5 - 5.1  mmol/L   Chloride 97 (L) 101 - 111 mmol/L   CO2 30 22 - 32 mmol/L   Glucose, Bld 267 (H) 65 - 99 mg/dL   BUN 19 6 - 20 mg/dL   Creatinine, Ser 0.87 0.44 - 1.00 mg/dL   Calcium 7.6 (L) 8.9 - 10.3 mg/dL   GFR calc non Af Amer >60 >60 mL/min   GFR calc Af Amer >60 >60 mL/min   Anion gap 9 5 - 15  CBC     Status: Abnormal   Collection Time: 07/16/16  6:54 AM  Result Value Ref Range   WBC 8.5 3.6 - 11.0 K/uL   RBC 3.57 (L) 3.80 - 5.20 MIL/uL   Hemoglobin 8.3 (L) 12.0 - 16.0 g/dL   HCT 25.7 (L) 35.0 - 47.0 %   MCV 72.1 (L) 80.0 - 100.0 fL   MCH 23.4 (L) 26.0 - 34.0 pg   MCHC 32.4 32.0 - 36.0 g/dL   RDW 17.4 (H) 11.5 - 14.5 %   Platelets 178 150 - 440 K/uL  Glucose, capillary     Status: Abnormal   Collection Time: 07/16/16  7:55 AM  Result Value Ref Range   Glucose-Capillary 258 (H) 65 - 99 mg/dL   Dg Chest Port 1 View  Result Date: 07/15/2016  CLINICAL DATA:  Acute onset of hyperglycemia and decreased O2 saturation. Initial encounter. EXAM: PORTABLE CHEST 1 VIEW COMPARISON:  Chest radiograph from 06/16/2016 FINDINGS: The lungs are well-aerated. Mild bibasilar airspace opacities raise concern for pneumonia. There is no evidence of pleural effusion or pneumothorax. The cardiomediastinal silhouette is mildly enlarged. No acute osseous abnormalities are seen. IMPRESSION: Mild bibasilar airspace opacities raise concern for pneumonia. Mild cardiomegaly. Electronically Signed   By: Garald Balding M.D.   On: 07/15/2016 06:11     ASSESSMENT AND PLAN: CHF with shortness of breath, and Occasional PND/Orthopnea. Admitted with gram negative UTI/sepsis. Will get echo and agree with lasix 20 mg daily.  Kimberlyn Quiocho A

## 2016-07-17 ENCOUNTER — Inpatient Hospital Stay
Admit: 2016-07-17 | Discharge: 2016-07-17 | Disposition: A | Discharge status: Home / Self Care | Payer: Medicare Other | Attending: Cardiovascular Disease | Admitting: Cardiovascular Disease

## 2016-07-17 LAB — GLUCOSE, CAPILLARY
Glucose-Capillary: 282 mg/dL — ABNORMAL HIGH (ref 65–99)
Glucose-Capillary: 212 mg/dL — ABNORMAL HIGH (ref 65–99)
Glucose-Capillary: 453 mg/dL — ABNORMAL HIGH (ref 65–99)
Glucose-Capillary: 245 mg/dL — ABNORMAL HIGH (ref 65–99)
Glucose-Capillary: 170 mg/dL — ABNORMAL HIGH (ref 65–99)
Glucose-Capillary: 124 mg/dL — ABNORMAL HIGH (ref 65–99)

## 2016-07-17 LAB — CBC
HCT: 26.6 % — ABNORMAL LOW (ref 35.0–47.0)
Hemoglobin: 8.4 g/dL — ABNORMAL LOW (ref 12.0–16.0)
MCH: 22.6 pg — ABNORMAL LOW (ref 26.0–34.0)
MCHC: 31.5 g/dL — ABNORMAL LOW (ref 32.0–36.0)
MCV: 71.8 fL — ABNORMAL LOW (ref 80.0–100.0)
Platelets: 223 10*3/uL (ref 150–440)
RBC: 3.7 MIL/uL — ABNORMAL LOW (ref 3.80–5.20)
RDW: 17.1 % — ABNORMAL HIGH (ref 11.5–14.5)
WBC: 8.5 10*3/uL (ref 3.6–11.0)

## 2016-07-17 LAB — BLOOD CULTURE ID PANEL (REFLEXED)

## 2016-07-17 LAB — URINE CULTURE: Culture: >=100000 — AB

## 2016-07-17 LAB — BASIC METABOLIC PANEL
Anion gap: 8 (ref 5–15)
BUN: 16 mg/dL (ref 6–20)
CO2: 33 mmol/L — ABNORMAL HIGH (ref 22–32)
Calcium: 7.6 mg/dL — ABNORMAL LOW (ref 8.9–10.3)
Chloride: 97 mmol/L — ABNORMAL LOW (ref 101–111)
Creatinine, Ser: 0.87 mg/dL (ref 0.44–1.00)
GFR calc Af Amer: >60 mL/min (ref >60)
GFR calc non Af Amer: >60 mL/min (ref >60)
Glucose, Bld: 219 mg/dL — ABNORMAL HIGH (ref 65–99)
Potassium: 3.8 mmol/L (ref 3.5–5.1)
Sodium: 138 mmol/L (ref 135–145)

## 2016-07-17 MED ORDER — LEVOFLOXACIN 500 MG PO TABS: 500.0000 mg | ORAL_TABLET | Freq: Every day | ORAL | Status: DC

## 2016-07-17 MED ORDER — INSULIN DETEMIR 100 UNIT/ML ~~LOC~~ SOLN
10.0000 [IU] | Freq: Once | SUBCUTANEOUS | Status: AC
  Administered 2016-07-17: 12:00:00 10 [IU] via SUBCUTANEOUS
  Filled 2016-07-17: qty 0.1

## 2016-07-17 MED ORDER — INSULIN ASPART 100 UNIT/ML ~~LOC~~ SOLN
6.0000 [IU] | Freq: Three times a day (TID) | SUBCUTANEOUS | Status: DC
  Administered 2016-07-17 – 2016-07-18 (×3): 6 [IU] via SUBCUTANEOUS
  Filled 2016-07-17 (×3): qty 6

## 2016-07-17 MED ORDER — LEVOFLOXACIN 750 MG PO TABS
750.0000 mg | ORAL_TABLET | Freq: Every day | ORAL | Status: DC
  Administered 2016-07-17 – 2016-07-18 (×2): 750 mg via ORAL
  Filled 2016-07-17 (×2): qty 1

## 2016-07-17 MED ORDER — INSULIN DETEMIR 100 UNIT/ML ~~LOC~~ SOLN
45.0000 [IU] | Freq: Two times a day (BID) | SUBCUTANEOUS | Status: DC
  Administered 2016-07-17 – 2016-07-18 (×2): 45 [IU] via SUBCUTANEOUS
  Filled 2016-07-17 (×3): qty 0.45

## 2016-07-17 NOTE — Progress Notes (Signed)
*  PRELIMINARY RESULTS* Echocardiogram 2D Echocardiogram has been performed.  Sherrie Sport 07/17/2016, 3:44 PM

## 2016-07-17 NOTE — Progress Notes (Signed)
Pharmacy Antibiotic Note  Cristina [name] is a 56 y.o. female admitted on 07/15/2016 with HCAP.  Pharmacy has been consulted for levofloxacin dosing.  Plan: Levofloxacin 750 mg PO Q24H  Height: 4\' 11"  (149.9 cm) Weight: 253 lb 1.6 oz (114.8 kg) IBW/kg (Calculated) : 43.2  Temp (24hrs), Avg:98.5 F (36.9 C), Min:98.3 F (36.8 C), Max:98.6 F (37 C)   Recent Labs Lab 07/15/16 0554 07/15/16 0751 07/16/16 0654 07/17/16 0355  WBC 10.6  --  8.5 8.5  CREATININE 1.10*  --  0.87 0.87  LATICACIDVEN 1.4 1.5  --   --     Estimated Creatinine Clearance: 79.9 mL/min (by C-G formula based on SCr of 0.87 mg/dL).    Allergies  Allergen Reactions  . Shellfish-Derived Products Anaphylaxis and Shortness Of Breath    Stated by Patient  . Codeine   . Thorazine [Chlorpromazine] Swelling  . Tylenol With Codeine #3 [Acetaminophen-Codeine] Other (See Comments)    Hallucinations    Thank you for allowing pharmacy to be a part of this patient's care.  Laural Benes, Pharm.D., BCPS Clinical Pharmacist 07/17/2016 2:13 PM

## 2016-07-17 NOTE — Progress Notes (Signed)
SUBJECTIVE: Patient is feeling much better   Vitals:   07/16/16 1955 07/16/16 2215 07/17/16 0402 07/17/16 0430  BP: 116/61  121/60   Pulse: 95 88 81   Resp: 20 18 20    Temp: 98.6 F (37 C)  98.3 F (36.8 C)   TempSrc:      SpO2: 99% 94% 97%   Weight:    253 lb 1.6 oz (114.8 kg)  Height:        Intake/Output Summary (Last 24 hours) at 07/17/16 0842 Last data filed at 07/17/16 0553  Gross per 24 hour  Intake             1090 ml  Output              900 ml  Net              190 ml    LABS: Basic Metabolic Panel:  Recent Labs  07/16/16 0654 07/17/16 0355  NA 136 138  K 3.6 3.8  CL 97* 97*  CO2 30 33*  GLUCOSE 267* 219*  BUN 19 16  CREATININE 0.87 0.87  CALCIUM 7.6* 7.6*   Liver Function Tests:  Recent Labs  07/15/16 0554  AST 92*  ALT 58*  ALKPHOS 149*  BILITOT 0.7  PROT 7.1  ALBUMIN 2.7*   No results for input(s): LIPASE, AMYLASE in the last 72 hours. CBC:  Recent Labs  07/15/16 0554 07/16/16 0654 07/17/16 0355  WBC 10.6 8.5 8.5  NEUTROABS 8.8*  --   --   HGB 8.9* 8.3* 8.4*  HCT 27.2* 25.7* 26.6*  MCV 71.7* 72.1* 71.8*  PLT 169 178 223   Cardiac Enzymes: No results for input(s): CKTOTAL, CKMB, CKMBINDEX, TROPONINI in the last 72 hours. BNP: Invalid input(s): POCBNP D-Dimer: No results for input(s): DDIMER in the last 72 hours. Hemoglobin A1C:  Recent Labs  07/15/16 0751  HGBA1C 10.4*   Fasting Lipid Panel: No results for input(s): CHOL, HDL, LDLCALC, TRIG, CHOLHDL, LDLDIRECT in the last 72 hours. Thyroid Function Tests: No results for input(s): TSH, T4TOTAL, T3FREE, THYROIDAB in the last 72 hours.  Invalid input(s): FREET3 Anemia Panel: No results for input(s): VITAMINB12, FOLATE, FERRITIN, TIBC, IRON, RETICCTPCT in the last 72 hours.   PHYSICAL EXAM General: Well developed, well nourished, in no acute distress HEENT:  Normocephalic and atramatic Neck:  No JVD.  Lungs: Clear bilaterally to auscultation and percussion. Heart:  HRRR . Normal S1 and S2 without gallops or murmurs.  Abdomen: Bowel sounds are positive, abdomen soft and non-tender  Msk:  Back normal, normal gait. Normal strength and tone for age. Extremities: No clubbing, cyanosis or edema.   Neuro: Alert and oriented X 3. Psych:  Good affect, responds appropriately  TELEMETRY: Sinus rhythm  ASSESSMENT AND PLAN: Congestive heart failure which is compensated at this time with UTI and possible sepsis. Will continue to follow the patient with you and waiting for echo to be done.  Active Problems:   Sepsis ([place])    Dionisio David, MD, [facility] 07/17/2016 8:42 AM

## 2016-07-17 NOTE — NC FL2 (Signed)
[place] LEVEL OF CARE SCREENING TOOL     IDENTIFICATION  Patient Name: Cristina [name] Birthdate: [birth year] Sex: female Admission Date (Current Location): 07/15/2016  [place] and [place] Number:  Selena Lesser [id number] [place] and Address:  [facility], [address], [place], [place] [address]      Provider Number: [id number]  Attending Physician Name and Address:  Nicholes Mango, MD  Relative Name and Phone Number:  Sharen Hones Daughter [phone] or  [place] Sister [phone]     Current Level of Care: Hospital Recommended Level of Care: [place] Prior Approval Number:    Date Approved/Denied:   PASRR Number:    Discharge Plan: Domiciliary (Rest home) ([facility])    Current Diagnoses: Patient Active Problem List   Diagnosis Date Noted  . Sepsis ([place]) 07/15/2016  . Septic shock ([place]) 10/28/2015  . COPD (chronic obstructive pulmonary disease) ([place]) 07/31/2015  . Aspirin overdose 07/30/2015  . Schizoaffective disorder, bipolar type ([place]) 12/05/2014  . HTN (hypertension) 12/05/2014  . Sleep apnea 12/05/2014  . Diabetes ([place]) 12/05/2014  . Incomplete bladder emptying 12/04/2014  . Recurrent UTI 12/04/2014  . Polyneuropathy 03/20/2009  . Arthritis, degenerative 05/23/2007  . HLD (hyperlipidemia) 05/23/2007    Orientation RESPIRATION BLADDER Height & Weight     Self, Time, Situation, Place  Normal Continent Weight: 253 lb 1.6 oz (114.8 kg) Height:  4\' 11"  (149.9 cm)  BEHAVIORAL SYMPTOMS/MOOD NEUROLOGICAL BOWEL NUTRITION STATUS      Continent Diet (Carb Modified)  AMBULATORY STATUS COMMUNICATION OF NEEDS Skin   Limited Assist Verbally Normal                       Personal Care Assistance Level of Assistance  Feeding, Bathing, Dressing Bathing Assistance: Limited assistance Feeding assistance: Independent  Dressing:  Limited assistance       Functional Limitations Info  Sight, Hearing,  Speech Sight Info: Adequate Hearing Info: Adequate Speech Info: Adequate    SPECIAL CARE FACTORS FREQUENCY  PT (By licensed PT)     PT Frequency: Home Health PT minimum 2x a week              Contractures Contractures Info: Not present    Additional Factors Info  Code Status, Allergies, Psychotropic, Insulin Sliding Scale Code Status Info: Full Code Allergies Info: SHELLFISH-DERIVED PRODUCTS, CODEINE, THORAZINE CHLORPROMAZINE, TYLENOL WITH CODEINE #3 ACETAMINOPHEN-CODEINE  Psychotropic Info: mirtazapine (REMERON) tablet 45 mg and venlafaxine XR (EFFEXOR-XR) 24 hr capsule 225 mg and haloperidol (HALDOL) tablet 5 mg Insulin Sliding Scale Info: insulin aspart (novoLOG) injection 0-15 Units 3x a day with meals.       Current Medications (07/17/2016):  This is the current hospital active medication list Current Facility-Administered Medications  Medication Dose Route Frequency Provider Last Rate Last Dose  . acetaminophen (TYLENOL) tablet 650 mg  650 mg Oral Q6H PRN Bettey Costa, MD   650 mg at 07/16/16 8413   Or  . acetaminophen (TYLENOL) suppository 650 mg  650 mg Rectal Q6H PRN Mody, Sital, MD      . albuterol (PROVENTIL) (2.5 MG/3ML) 0.083% nebulizer solution 2.5 mg  2.5 mg Nebulization Q6H PRN Mody, Sital, MD      . aspirin EC tablet 81 mg  81 mg Oral Daily Mody, Sital, MD   81 mg at 07/17/16 1144  . atorvastatin (LIPITOR) tablet 20 mg  20 mg Oral QHS Bettey Costa, MD   20 mg at 07/16/16 2111  . budesonide (PULMICORT) nebulizer  solution 0.5 mg  0.5 mg Nebulization BID Bettey Costa, MD   0.5 mg at 07/17/16 0752  . clonazePAM (KLONOPIN) tablet 0.5 mg  0.5 mg Oral BID Bettey Costa, MD   0.5 mg at 07/17/16 1144  . diphenoxylate-atropine (LOMOTIL) 2.5-0.025 MG per tablet 1 tablet  1 tablet Oral Daily PRN Mody, Sital, MD      . enoxaparin (LOVENOX) injection 40 mg  40 mg Subcutaneous BID Mody, Sital, MD   40 mg at 07/17/16 1145  . furosemide (LASIX) injection 40 mg  40 mg Intravenous Once  Paulette Blanch, MD      . furosemide (LASIX) tablet 20 mg  20 mg Oral BID Gryffin Altice, MD   20 mg at 07/17/16 1144  . haloperidol (HALDOL) tablet 5 mg  5 mg Oral QHS Bettey Costa, MD   5 mg at 07/16/16 2111  . HYDROcodone-acetaminophen (NORCO/VICODIN) 5-325 MG per tablet 1-2 tablet  1-2 tablet Oral Q4H PRN Bettey Costa, MD   2 tablet at 07/16/16 1517  . insulin aspart (novoLOG) injection 0-15 Units  0-15 Units Subcutaneous TID WC Nicholes Mango, MD   18 Units at 07/17/16 1143  . insulin aspart (novoLOG) injection 0-5 Units  0-5 Units Subcutaneous QHS Bettey Costa, MD   3 Units at 07/16/16 2110  . insulin aspart (novoLOG) injection 6 Units  6 Units Subcutaneous TID WC Skyleen Bentley, MD   6 Units at 07/17/16 1225  . insulin detemir (LEVEMIR) injection 45 Units  45 Units Subcutaneous BID Ravin Bendall, MD      . ketorolac (TORADOL) 15 MG/ML injection 15 mg  15 mg Intravenous Q6H PRN Mody, Sital, MD      . levofloxacin (LEVAQUIN) tablet 750 mg  750 mg Oral Daily Mercadez Heitman, MD      . loratadine (CLARITIN) tablet 10 mg  10 mg Oral Daily Mody, Sital, MD   10 mg at 07/17/16 1144  . metoprolol tartrate (LOPRESSOR) tablet 50 mg  50 mg Oral BID Bettey Costa, MD   50 mg at 07/17/16 1144  . mirtazapine (REMERON) tablet 45 mg  45 mg Oral QHS Bettey Costa, MD   45 mg at 07/16/16 2110  . nitroGLYCERIN (NITROSTAT) SL tablet 0.4 mg  0.4 mg Sublingual Q5 min PRN Mody, Sital, MD      . ondansetron (ZOFRAN) tablet 4 mg  4 mg Oral Q6H PRN Mody, Sital, MD       Or  . ondansetron (ZOFRAN) injection 4 mg  4 mg Intravenous Q6H PRN Mody, Sital, MD      . pantoprazole (PROTONIX) EC tablet 40 mg  40 mg Oral BID AC Mody, Sital, MD   40 mg at 07/17/16 1144  . pregabalin (LYRICA) capsule 100 mg  100 mg Oral TID Bettey Costa, MD   100 mg at 07/17/16 1144  . senna-docusate (Senokot-S) tablet 1 tablet  1 tablet Oral QHS PRN Bettey Costa, MD   1 tablet at 07/15/16 2021  . umeclidinium-vilanterol (ANORO ELLIPTA) 62.5-25 MCG/INH 1 puff  1  puff Inhalation Daily Bettey Costa, MD   1 puff at 07/17/16 1144  . venlafaxine XR (EFFEXOR-XR) 24 hr capsule 225 mg  225 mg Oral Q breakfast Bettey Costa, MD   225 mg at 07/17/16 1144     Discharge Medications: Please see discharge summary for a list of discharge medications.  Relevant Imaging Results:  Relevant Lab Results:   Additional Information SSN: [id number]      Anell Barr

## 2016-07-17 NOTE — Progress Notes (Signed)
Physical Therapy Treatment Patient Details Name: Cristina [name] MRN: [id number] DOB: 30-Nov-1960 Today's Date: 07/17/2016    History of Present Illness Pt is a 56 y.o. female presenting to hospital with AMS, hyperglycemia, and hypoxic.  Pt admitted with sepsis d/t HCAP and UTI.  PMH includes COPD, CHF, DM, schizoaffective disorder, frequent UTI's, abdominal hernia, CTS, and OSA on CPAP.    PT Comments    Pt able to navigate around nursing loop with RW CGA (pt appeared to fatigue with distance but no loss of balance or increased assist needs noted).  No loss of balance noted during session with functional mobility.  Pt requires significant assist with supine to sit from bed but pt appears to have modified bed set-up at home that works better for pt.  Will continue to progress pt with strengthening, balance, and promoting independence with functional mobility during hospital stay.   Follow Up Recommendations  Home health PT     Equipment Recommendations  Rolling walker with 5" wheels    Recommendations for Other Services       Precautions / Restrictions Precautions Precautions: Fall Restrictions Weight Bearing Restrictions: No    Mobility  Bed Mobility Overal bed mobility: Needs Assistance Bed Mobility: Supine to Sit     Supine to sit: Max assist;HOB elevated     General bed mobility comments: use of bedrail; increased effort and time to perform;  assist for trunk and B LE's; (pt reports trapeze type system at home to pull up on per pt's description d/t h/o difficulty with bed mobility)  Transfers Overall transfer level: Needs assistance Equipment used: Rolling walker (2 wheeled) Transfers: Sit to/from [facility] Sit to Stand: Min guard Stand pivot transfers: Min guard (stand step turn bed to/from commode without assistive device)       General transfer comment: fairly strong stand; steady without loss of balance  Ambulation/Gait Ambulation/Gait  assistance: Min guard Ambulation Distance (Feet): 190 Feet Assistive device: Rolling walker (2 wheeled)   Gait velocity: decreased   General Gait Details: decreased B step length; very mildly unsteady but no overt loss of balance noted   Stairs            Wheelchair Mobility    Modified Rankin (Stroke Patients Only)       Balance Overall balance assessment: Needs assistance Sitting-balance support: Bilateral upper extremity supported;Feet supported Sitting balance-[name]: Good Sitting balance - Comments: sitting reaching within BOS   Standing balance support: Bilateral upper extremity supported (on RW) Standing balance-[name]: Good Standing balance comment: dynamic standing activities within BOS                            Cognition Arousal/Alertness: Awake/alert (Pt initially sleeping upon PT arrival but woke with cueing) Behavior During Therapy: [facility] for tasks assessed/performed Overall Cognitive Status: Within Functional Limits for tasks assessed                                        Exercises      General Comments General comments (skin integrity, edema, etc.): Nursing removed external urinary catheter prior to PT session.  Nursing cleared pt for participation in physical therapy.  Pt agreeable to PT session.       Pertinent Vitals/Pain Pain Assessment: No/denies pain  Pt's O2 88% on room air resting in bed beginning of session (  nursing present and pt placed on 2 L O2) but improved to 95% or greater on 2 L O2 via nasal cannula during session.    Home Living                      Prior Function            PT Goals (current goals can now be found in the care plan section) Acute Rehab PT Goals Patient Stated Goal: to go home PT Goal Formulation: With patient Time For Goal Achievement: 07/30/16 Potential to Achieve Goals: Good Progress towards PT goals: Progressing toward goals    Frequency    Min  2X/week      PT Plan Current plan remains appropriate    Co-evaluation              AM-PAC PT "6 Clicks" Daily Activity  Outcome Measure  Difficulty turning over in bed (including adjusting bedclothes, sheets and blankets)?: Total Difficulty moving from lying on back to sitting on the side of the bed? : Total Difficulty sitting down on and standing up from a chair with arms (e.g., wheelchair, bedside commode, etc,.)?: Total Help needed moving to and from a bed to chair (including a wheelchair)?: A Little Help needed walking in hospital room?: A Little Help needed climbing 3-5 steps with a railing? : Total 6 Click Score: 10    End of Session Equipment Utilized During Treatment: Gait belt;Oxygen (2 L O2 via nasal cannula) Activity Tolerance: Patient tolerated treatment well Patient left: in chair;with call bell/phone within reach;with chair alarm set;with family/visitor present Nurse Communication: Mobility status;Precautions (Pt's O2 status during session.) PT Visit Diagnosis: Unsteadiness on feet (R26.81);Muscle weakness (generalized) (M62.81);Difficulty in walking, not elsewhere classified (R26.2)     Time: 1445-1510 PT Time Calculation (min) (ACUTE ONLY): 25 min  Charges:  $Therapeutic Exercise: 8-22 mins $Therapeutic Activity: 8-22 mins                    G CodesLeitha Bleak, PT 07/17/16, 3:43 PM [phone]

## 2016-07-17 NOTE — Progress Notes (Signed)
[place] at [place] NAME: Cristina [name]    MR#:  [id number]  DATE OF BIRTH:  10/31/1960  SUBJECTIVE:  CHIEF COMPLAINT:  Patient's shortness of breath is better, Accu-Chek 453   REVIEW OF SYSTEMS:  CONSTITUTIONAL: No fever, fatigue or weakness.  EYES: No blurred or double vision.  EARS, NOSE, AND THROAT: No tinnitus or ear pain.  RESPIRATORY:Reports some cough, shortness of breath, denies wheezing or hemoptysis.  CARDIOVASCULAR: No chest pain, orthopnea, edema.  GASTROINTESTINAL: No nausea, vomiting, diarrhea or abdominal pain.  GENITOURINARY: No dysuria, hematuria.  ENDOCRINE: No polyuria, nocturia,  HEMATOLOGY: No anemia, easy bruising or bleeding SKIN: No rash or lesion. MUSCULOSKELETAL: No joint pain or arthritis.   NEUROLOGIC: No tingling, numbness, weakness.  PSYCHIATRY: No anxiety or depression.   DRUG ALLERGIES:   Allergies  Allergen Reactions  . Shellfish-Derived Products Anaphylaxis and Shortness Of Breath    Stated by Patient  . Codeine   . Thorazine [Chlorpromazine] Swelling  . Tylenol With Codeine #3 [Acetaminophen-Codeine] Other (See Comments)    Hallucinations    VITALS:  Blood pressure 121/60, pulse 81, temperature 98.3 F (36.8 C), resp. rate 20, height 4\' 11"  (1.499 m), weight 114.8 kg (253 lb 1.6 oz), SpO2 95 %.  PHYSICAL EXAMINATION:  GENERAL:  56 y.o.-year-old patient lying in the bed with no acute distress.  EYES: Pupils equal, round, reactive to light and accommodation. No scleral icterus. Extraocular muscles intact.  HEENT: Head atraumatic, normocephalic. Oropharynx and nasopharynx clear.  NECK:  Supple, no jugular venous distention. No thyroid enlargement, no tenderness.  LUNGS: Moderate breath sounds bilaterally, no wheezing, rales,rhonchi or crepitation. No use of accessory muscles of respiration.  CARDIOVASCULAR: S1, S2 normal. No murmurs, rubs, or gallops.  ABDOMEN: Soft, nontender,  nondistended. Bowel sounds present. No organomegaly or mass.  EXTREMITIES: No pedal edema, cyanosis, or clubbing.  NEUROLOGIC: Cranial nerves II through XII are intact. Muscle strength 5/5 in all extremities. Sensation intact. Gait not checked.  PSYCHIATRIC: The patient is alert and oriented x 3.  SKIN: No obvious rash, lesion, or ulcer.    LABORATORY PANEL:   CBC  Recent Labs Lab 07/17/16 0355  WBC 8.5  HGB 8.4*  HCT 26.6*  PLT 223   ------------------------------------------------------------------------------------------------------------------  Chemistries   Recent Labs Lab 07/15/16 0554  07/17/16 0355  NA 134*  < > 138  K 3.9  < > 3.8  CL 95*  < > 97*  CO2 28  < > 33*  GLUCOSE 416*  < > 219*  BUN 26*  < > 16  CREATININE 1.10*  < > 0.87  CALCIUM 7.9*  < > 7.6*  AST 92*  --   --   ALT 58*  --   --   ALKPHOS 149*  --   --   BILITOT 0.7  --   --   < > = values in this interval not displayed. ------------------------------------------------------------------------------------------------------------------  Cardiac Enzymes No results for input(s): TROPONINI in the last 168 hours. ------------------------------------------------------------------------------------------------------------------  RADIOLOGY:  No results found.  EKG:   Orders placed or performed during the hospital encounter of 07/15/16  . EKG 12-Lead  . EKG 12-Lead  . ED EKG 12-Lead  . ED EKG 12-Lead  . EKG 12-Lead  . EKG 12-Lead    ASSESSMENT AND PLAN:     56 year old female with morbid obesity, OSA on CPAP, chronic diastolic heart failure, COPD with 2 L of oxygen at night  and diabetes who  Presented initially with hyperglycemia now found to have sepsis.   1. Sepsis: Patient presents with fever of 102, hypoxia, tachypnea and tachycardia due to HCAP and UTI. Patient given gentle IVF, Clinically improving Blood cultures with coag-negative staph probably a contaminant, urine culture  with Escherichia coli sensitive to ofloxacin's   2. HCAP: Clinically improving discontinue cefepime and start patient on levofloxacin Continue cefepime. MRSA PCR negative d/c VANC  3. UTI: Given cefepime changed to levofloxacin urine culture with Escherichia coli sensitive to Cipro  4. Acute on chronic diastolic heart failure with LEE: One dose of IV Lasix given in ED. Continue with IV Lasix and follow weights and I/o., Changed to 20 by mouth once daily Echocardiogram ordered, pending Cardiology consult- khan  5. OSA: CPAP ordered 6. COPD no signs of exacerbation 7. AKI: from sepsis and CHF Renal function improved. Avoid nephrotoxins  8. Uncontrolled DM from sepsis Patient is hypoglycemic with Accu-Chek 453 today Levemir dose increased to 45 units twice a day she takes 51-52 units at home continue close monitoring  Diabetes coordinator follow-up  SSI and ADA diet along with outpatient long acting insulin. hga1c 10.4   PTs are pending home health PT   All the records are reviewed and case discussed with Care Management/Social Workerr. Management plans discussed with the patient, family and they are in agreement.  CODE STATUS: fc   TOTAL TIME TAKING CARE OF THIS PATIENT: 36  minutes.   POSSIBLE D/C IN  AM DAYS, DEPENDING ON CLINICAL CONDITION.  Note: This dictation was prepared with Dragon dictation along with smaller phrase technology. Any transcriptional errors that result from this process are unintentional.   Nicholes Mango M.D on 07/17/2016 at 2:06 PM  Between 7am to 6pm - Pager - [phone] After 6pm go to [email or web address] - password [email or web address] [facility]  [facility]  Office  [phone]  CC: Primary care physician; Denton Lank, MD

## 2016-07-17 NOTE — Care Management (Signed)
Physical therapy evaluation completed. Recommending Home Health Physical Therapy Supervision. A resident of [facility].  [facility] uses [facility] for Home Health needs  Shelbie Ammons RN MSN CCM Care Management [phone]

## 2016-07-17 NOTE — Progress Notes (Signed)
PLACED ON 3L [place] AFTER NEB TREATMENT.

## 2016-07-17 NOTE — Clinical Social Work Note (Signed)
Clinical Social Work Assessment  Patient Details  Name: Cristina [name] MRN: [id number] Date of Birth: Aug 07, 1960  Date of referral:  07/17/16               Reason for consult:  Facility Placement                Permission sought to share information with:  Family Supports, Customer service manager Permission granted to share information::  Yes, Verbal Permission Granted  Name::     Ashby,Lenea Daughter [phone] or Smith,Jane Sister [phone]   Agency::  [facility]  Relationship::     Contact Information:     Housing/Transportation Living arrangements for the past 2 months:  [address] [place] of Information:  Patient Patient Interpreter Needed:  None Criminal Activity/Legal Involvement Pertinent to Current Situation/Hospitalization:  No - Comment as needed Significant Relationships:  Adult Children, Other Family Members Lives with:  Facility Resident Do you feel safe going back to the place where you live?  Yes Need for family participation in patient care:  No (Coment)  Care giving concerns: Patient does not have any concerns about returning back to [facility].   Social Worker assessment / plan:  Patient is a pleasant 56 year old female who is alert and oriented x4 and able to express her feelings by discussing how her care is going at ALF.  Patient states she is happy with where the ALF and is looking forward to returning back.  Patient expressed she has been at ALF for several years, and she likes the people that work and live there.  Patient did not express any other questions or concerns.  Employment status:  Retired Forensic scientist:  Information systems manager, Medicaid In [place] PT Recommendations:  Home with [place] / Referral to community resources:  [place]  Patient/Family's Response to care:  Patient is in agreement to returning back to [facility]  Patient/Family's Understanding of and Emotional  Response to Diagnosis, Current Treatment, and Prognosis:  Patient aware of current treatment plan and prognosis.  Emotional Assessment Appearance:  Appears stated age Attitude/Demeanor/Rapport:    Affect (typically observed):  Appropriate, Calm, Pleasant Orientation:  Oriented to Self, Oriented to Place, Oriented to  Time, Oriented to Situation Alcohol / Substance use:  Not Applicable Psych involvement (Current and /or in the community):  No (Comment)  Discharge Needs  Concerns to be addressed:  Care Coordination Readmission within the last 30 days:  No Current discharge risk:  None Barriers to Discharge:  Continued Medical Work up   Ross Ludwig, [place] 07/17/2016, 4:01 PM

## 2016-07-18 LAB — GLUCOSE, CAPILLARY
Glucose-Capillary: 372 mg/dL — ABNORMAL HIGH (ref 65–99)
Glucose-Capillary: 278 mg/dL — ABNORMAL HIGH (ref 65–99)

## 2016-07-18 LAB — CULTURE, BLOOD (ROUTINE X 2): Special Requests: ADEQUATE

## 2016-07-18 LAB — ECHOCARDIOGRAM COMPLETE
Height: 59 in
Weight: 4049.6 oz

## 2016-07-18 MED ORDER — INSULIN ASPART 100 UNIT/ML ~~LOC~~ SOLN: 0.0000 [IU] | Freq: Three times a day (TID) | SUBCUTANEOUS | 1 refills | Status: DC

## 2016-07-18 MED ORDER — INSULIN DETEMIR 100 UNIT/ML ~~LOC~~ SOLN: 35.0000 [IU] | Freq: Two times a day (BID) | SUBCUTANEOUS | 0 refills | Status: DC

## 2016-07-18 MED ORDER — LEVOFLOXACIN 750 MG PO TABS: 750.0000 mg | ORAL_TABLET | Freq: Every day | ORAL | 0 refills | Status: DC

## 2016-07-18 MED ORDER — SENNOSIDES-DOCUSATE SODIUM 8.6-50 MG PO TABS: 1.0000 | ORAL_TABLET | Freq: Every evening | ORAL | Status: DC | PRN

## 2016-07-18 MED ORDER — POTASSIUM CHLORIDE ER 20 MEQ PO TBCR: 10.0000 meq | EXTENDED_RELEASE_TABLET | Freq: Every day | ORAL | 0 refills | Status: DC

## 2016-07-18 MED ORDER — METOPROLOL TARTRATE 25 MG PO TABS: 12.5000 mg | ORAL_TABLET | Freq: Two times a day (BID) | ORAL | Status: AC

## 2016-07-18 MED ORDER — FUROSEMIDE 20 MG PO TABS: 20.0000 mg | ORAL_TABLET | Freq: Two times a day (BID) | ORAL | 0 refills | Status: DC

## 2016-07-18 MED ORDER — CLONAZEPAM 0.5 MG PO TABS: 0.5000 mg | ORAL_TABLET | Freq: Two times a day (BID) | ORAL | 0 refills | Status: AC

## 2016-07-18 NOTE — Discharge Instructions (Signed)
Follow-up with primary care physician in 5-7 days Follow-up with cardiology Dr. Humphrey Rolls in 2 week Monitor weights daily Follow-up with outpatient CHF clinic in 3-4 days Home health PT, ambulating with rolling walker

## 2016-07-18 NOTE — Progress Notes (Signed)
SUBJECTIVE: Patient is feeling much better   Vitals:   07/17/16 2205 07/18/16 0357 07/18/16 0500 07/18/16 0700  BP:  103/65    Pulse: 85 80    Resp:  20    Temp:  98 F (36.7 C)    TempSrc:      SpO2: 95% 98%  96%  Weight:   254 lb 12.8 oz (115.6 kg)   Height:        Intake/Output Summary (Last 24 hours) at 07/18/16 0954 Last data filed at 07/18/16 0414  Gross per 24 hour  Intake              100 ml  Output             1000 ml  Net             -900 ml    LABS: Basic Metabolic Panel:  Recent Labs  07/16/16 0654 07/17/16 0355  NA 136 138  K 3.6 3.8  CL 97* 97*  CO2 30 33*  GLUCOSE 267* 219*  BUN 19 16  CREATININE 0.87 0.87  CALCIUM 7.6* 7.6*   Liver Function Tests: No results for input(s): AST, ALT, ALKPHOS, BILITOT, PROT, ALBUMIN in the last 72 hours. No results for input(s): LIPASE, AMYLASE in the last 72 hours. CBC:  Recent Labs  07/16/16 0654 07/17/16 0355  WBC 8.5 8.5  HGB 8.3* 8.4*  HCT 25.7* 26.6*  MCV 72.1* 71.8*  PLT 178 223   Cardiac Enzymes: No results for input(s): CKTOTAL, CKMB, CKMBINDEX, TROPONINI in the last 72 hours. BNP: Invalid input(s): POCBNP D-Dimer: No results for input(s): DDIMER in the last 72 hours. Hemoglobin A1C: No results for input(s): HGBA1C in the last 72 hours. Fasting Lipid Panel: No results for input(s): CHOL, HDL, LDLCALC, TRIG, CHOLHDL, LDLDIRECT in the last 72 hours. Thyroid Function Tests: No results for input(s): TSH, T4TOTAL, T3FREE, THYROIDAB in the last 72 hours.  Invalid input(s): FREET3 Anemia Panel: No results for input(s): VITAMINB12, FOLATE, FERRITIN, TIBC, IRON, RETICCTPCT in the last 72 hours.   PHYSICAL EXAM General: Well developed, well nourished, in no acute distress HEENT:  Normocephalic and atramatic Neck:  No JVD.  Lungs: Clear bilaterally to auscultation and percussion. Heart: HRRR . Normal S1 and S2 without gallops or murmurs.  Abdomen: Bowel sounds are positive, abdomen soft and  non-tender  Msk:  Back normal, normal gait. Normal strength and tone for age. Extremities: No clubbing, cyanosis or edema.   Neuro: Alert and oriented X 3. Psych:  Good affect, responds appropriately  TELEMETRY:Sinus rhythm  ASSESSMENT AND PLAN: CHF due to diastolic dysfunction with UTI being discharged with follow-up in the office on Tuesday at 10:00. After discharge advise follow-up on Tuesday at 10 AM.  Active Problems:   Sepsis ([place])    Dionisio David, MD, [facility] 07/18/2016 9:54 AM

## 2016-07-18 NOTE — NC FL2 (Signed)
[place] LEVEL OF CARE SCREENING TOOL     IDENTIFICATION  Patient Name: Cristina [name] Birthdate: [birth year] Sex: female Admission Date (Current Location): 07/15/2016  [place] and [place] Number:  Selena Lesser [id number] [place] and Address:  [facility], [address], [place], [place] [address]      Provider Number: [id number]  Attending Physician Name and Address:  Nicholes Mango, MD  Relative Name and Phone Number:  Sharen Hones Daughter [phone] or  Johnson Siding Sister [phone]     Current Level of Care: Hospital Recommended Level of Care: [place] Prior Approval Number:    Date Approved/Denied:   PASRR Number:    Discharge Plan: Domiciliary (Rest home) ([facility])    Current Diagnoses: Patient Active Problem List   Diagnosis Date Noted  . Sepsis ([place]) 07/15/2016  . Septic shock ([place]) 10/28/2015  . COPD (chronic obstructive pulmonary disease) ([place]) 07/31/2015  . Aspirin overdose 07/30/2015  . Schizoaffective disorder, bipolar type ([place]) 12/05/2014  . HTN (hypertension) 12/05/2014  . Sleep apnea 12/05/2014  . Diabetes ([place]) 12/05/2014  . Incomplete bladder emptying 12/04/2014  . Recurrent UTI 12/04/2014  . Polyneuropathy 03/20/2009  . Arthritis, degenerative 05/23/2007  . HLD (hyperlipidemia) 05/23/2007    Orientation RESPIRATION BLADDER Height & Weight     Self, Time, Situation, Place  Normal Continent Weight: 254 lb 12.8 oz (115.6 kg) Height:  4\' 11"  (149.9 cm)  BEHAVIORAL SYMPTOMS/MOOD NEUROLOGICAL BOWEL NUTRITION STATUS      Continent Diet (Carb Modified)  AMBULATORY STATUS COMMUNICATION OF NEEDS Skin   Limited Assist Verbally Normal                       Personal Care Assistance Level of Assistance  Feeding, Bathing, Dressing Bathing Assistance: Limited assistance Feeding assistance: Independent       Functional Limitations Info  Sight, Hearing, Speech Sight Info:  Adequate Hearing Info: Adequate Speech Info: Adequate    SPECIAL CARE FACTORS FREQUENCY  PT (By licensed PT)     PT Frequency: Home Health PT minimum 2x a week              Contractures Contractures Info: Not present    Additional Factors Info  Code Status, Allergies, Psychotropic, Insulin Sliding Scale Code Status Info: Full Code Allergies Info: SHELLFISH-DERIVED PRODUCTS, CODEINE, THORAZINE CHLORPROMAZINE, TYLENOL WITH CODEINE #3 ACETAMINOPHEN-CODEINE  Psychotropic Info: mirtazapine (REMERON) tablet 45 mg and venlafaxine XR (EFFEXOR-XR) 24 hr capsule 225 mg and haloperidol (HALDOL) tablet 5 mg Insulin Sliding Scale Info: insulin aspart (novoLOG) injection 0-15 Units 3x a day with meals.       Current Medications (07/18/2016):  This is the current hospital active medication list Current Facility-Administered Medications  Medication Dose Route Frequency Provider Last Rate Last Dose  . acetaminophen (TYLENOL) tablet 650 mg  650 mg Oral Q6H PRN Bettey Costa, MD   650 mg at 07/16/16 1761   Or  . acetaminophen (TYLENOL) suppository 650 mg  650 mg Rectal Q6H PRN Mody, Sital, MD      . albuterol (PROVENTIL) (2.5 MG/3ML) 0.083% nebulizer solution 2.5 mg  2.5 mg Nebulization Q6H PRN Mody, Sital, MD      . aspirin EC tablet 81 mg  81 mg Oral Daily Mody, Sital, MD   81 mg at 07/18/16 1009  . atorvastatin (LIPITOR) tablet 20 mg  20 mg Oral QHS Bettey Costa, MD   20 mg at 07/17/16 2144  . budesonide (PULMICORT) nebulizer solution 0.5 mg  0.5  mg Nebulization BID Bettey Costa, MD   0.5 mg at 07/18/16 0740  . clonazePAM (KLONOPIN) tablet 0.5 mg  0.5 mg Oral BID Bettey Costa, MD   0.5 mg at 07/18/16 1009  . diphenoxylate-atropine (LOMOTIL) 2.5-0.025 MG per tablet 1 tablet  1 tablet Oral Daily PRN Mody, Sital, MD      . enoxaparin (LOVENOX) injection 40 mg  40 mg Subcutaneous BID Bettey Costa, MD   40 mg at 07/18/16 1008  . furosemide (LASIX) injection 40 mg  40 mg Intravenous Once Paulette Blanch, MD       . furosemide (LASIX) tablet 20 mg  20 mg Oral BID Thaniel Coluccio, MD   20 mg at 07/18/16 0800  . haloperidol (HALDOL) tablet 5 mg  5 mg Oral QHS Bettey Costa, MD   5 mg at 07/17/16 2144  . HYDROcodone-acetaminophen (NORCO/VICODIN) 5-325 MG per tablet 1-2 tablet  1-2 tablet Oral Q4H PRN Bettey Costa, MD   2 tablet at 07/16/16 1517  . insulin aspart (novoLOG) injection 0-15 Units  0-15 Units Subcutaneous TID WC Nicholes Mango, MD   15 Units at 07/18/16 0759  . insulin aspart (novoLOG) injection 0-5 Units  0-5 Units Subcutaneous QHS Bettey Costa, MD   3 Units at 07/16/16 2110  . insulin aspart (novoLOG) injection 6 Units  6 Units Subcutaneous TID WC Jailene Cupit, MD   6 Units at 07/18/16 0800  . insulin detemir (LEVEMIR) injection 45 Units  45 Units Subcutaneous BID Nicholes Mango, MD   45 Units at 07/18/16 1008  . ketorolac (TORADOL) 15 MG/ML injection 15 mg  15 mg Intravenous Q6H PRN Mody, Sital, MD      . levofloxacin (LEVAQUIN) tablet 750 mg  750 mg Oral Daily Rachella Basden, MD   750 mg at 07/18/16 1009  . loratadine (CLARITIN) tablet 10 mg  10 mg Oral Daily Bettey Costa, MD   10 mg at 07/18/16 1009  . metoprolol tartrate (LOPRESSOR) tablet 50 mg  50 mg Oral BID Bettey Costa, MD   12.5 mg at 07/18/16 1016  . mirtazapine (REMERON) tablet 45 mg  45 mg Oral QHS Bettey Costa, MD   45 mg at 07/17/16 2144  . nitroGLYCERIN (NITROSTAT) SL tablet 0.4 mg  0.4 mg Sublingual Q5 min PRN Mody, Sital, MD      . ondansetron (ZOFRAN) tablet 4 mg  4 mg Oral Q6H PRN Mody, Sital, MD       Or  . ondansetron (ZOFRAN) injection 4 mg  4 mg Intravenous Q6H PRN Mody, Sital, MD      . pantoprazole (PROTONIX) EC tablet 40 mg  40 mg Oral BID AC Mody, Sital, MD   40 mg at 07/18/16 0800  . pregabalin (LYRICA) capsule 100 mg  100 mg Oral TID Bettey Costa, MD   100 mg at 07/18/16 1008  . senna-docusate (Senokot-S) tablet 1 tablet  1 tablet Oral QHS PRN Bettey Costa, MD   1 tablet at 07/15/16 2021  . umeclidinium-vilanterol (ANORO ELLIPTA)  62.5-25 MCG/INH 1 puff  1 puff Inhalation Daily Mody, Sital, MD   1 puff at 07/18/16 1018  . venlafaxine XR (EFFEXOR-XR) 24 hr capsule 225 mg  225 mg Oral Q breakfast Bettey Costa, MD   225 mg at 07/18/16 0800     Discharge Medications: DISCHARGE MEDICATIONS:      Current Discharge Medication List       START taking these medications   Details  levofloxacin (LEVAQUIN) 750 MG tablet Take 1 tablet (  750 mg total) by mouth daily. Qty: 7 tablet, Refills: 0    potassium chloride 20 MEQ TBCR Take 10 mEq by mouth daily. Qty: 30 tablet, Refills: 0         CONTINUE these medications which have CHANGED   Details  furosemide (LASIX) 20 MG tablet Take 1 tablet (20 mg total) by mouth 2 (two) times daily. Qty: 60 tablet, Refills: 0    insulin detemir (LEVEMIR) 100 UNIT/ML injection Inject 0.35 mLs (35 Units total) into the skin 2 (two) times daily. Pt uses 50 units in the morning and 52 units at bedtime. Qty: 100 mL, Refills: 0         CONTINUE these medications which have NOT CHANGED   Details  acetaminophen (TYLENOL) 500 MG tablet Take 500 mg by mouth every 8 (eight) hours as needed for mild pain or moderate pain.    aspirin EC 81 MG tablet Take 81 mg by mouth daily.    atorvastatin (LIPITOR) 20 MG tablet Take 20 mg by mouth at bedtime.    cetirizine (ZYRTEC) 10 MG tablet Take 10 mg by mouth daily as needed for allergies.    clonazePAM (KLONOPIN) 0.5 MG tablet Take 1 tablet (0.5 mg total) by mouth 2 (two) times daily. Qty: 30 tablet, Refills: 0    diphenoxylate-atropine (LOMOTIL) 2.5-0.025 MG tablet Take 1 tablet by mouth daily as needed for diarrhea or loose stools.    Fluticasone-Salmeterol (ADVAIR) 250-50 MCG/DOSE AEPB Inhale 1 puff into the lungs 2 (two) times daily.    haloperidol (HALDOL) 5 MG tablet Take 1 tablet (5 mg total) by mouth at bedtime. Qty: 30 tablet, Refills: 0    haloperidol decanoate (HALDOL DECANOATE) 100 MG/ML injection Inject 1 mL (100  mg total) into the muscle every 30 (thirty) days. Qty: 1 mL, Refills: 0    meloxicam (MOBIC) 7.5 MG tablet Take 1 tablet (7.5 mg total) by mouth 2 (two) times daily. Qty: 60 tablet, Refills: 0    metFORMIN (GLUCOPHAGE-XR) 500 MG 24 hr tablet Take 500 mg by mouth 2 (two) times daily.    metoprolol (LOPRESSOR) 50 MG tablet Take 50 mg by mouth 2 (two) times daily.    mirtazapine (REMERON) 45 MG tablet Take 1 tablet (45 mg total) by mouth at bedtime. Qty: 60 tablet, Refills: 0    nitroGLYCERIN (NITROSTAT) 0.3 MG SL tablet Place 0.3 mg under the tongue every 5 (five) minutes as needed for chest pain.    ondansetron (ZOFRAN) 4 MG tablet Take 4 mg by mouth every 6 (six) hours as needed for nausea or vomiting.    pantoprazole (PROTONIX) 40 MG tablet Take 40 mg by mouth 2 (two) times daily.    pregabalin (LYRICA) 100 MG capsule Take 100 mg by mouth 3 (three) times daily.    umeclidinium-vilanterol (ANORO ELLIPTA) 62.5-25 MCG/INH AEPB Inhale 1 puff into the lungs daily.    !! venlafaxine XR (EFFEXOR-XR) 150 MG 24 hr capsule Take 150 mg by mouth daily.    !! venlafaxine XR (EFFEXOR-XR) 75 MG 24 hr capsule Take 75 mg by mouth daily with breakfast.    albuterol (PROVENTIL HFA;VENTOLIN HFA) 108 (90 BASE) MCG/ACT inhaler Inhale 2 puffs into the lungs every 6 (six) hours as needed for wheezing or shortness of breath.    insulin aspart (NOVOLOG) 100 UNIT/ML injection Inject 0-9 Units into the skin 3 (three) times daily with meals. CBG 70 - 120: 0 units CBG 121 - 150: 0 units CBG 151 - 200: 0  units CBG 201 - 250: 2 units CBG 251 - 300: 3 units CBG 301 - 350: 4 units CBG 351 - 400: 5 units Qty: 10 mL, Refills: 11       Relevant Imaging Results:  Relevant Lab Results:   Additional Information SSN: [id number]      Zettie Pho, LCSW

## 2016-07-18 NOTE — Clinical Social Work Note (Signed)
CSW contacted Thayer Headings at [facility] to discuss discharge for this patient. Thayer Headings confirmed that the ALF can receive the patient, and the ALF will transport this afternoon. CSW has sent all pertinent information to [facility] as requested. CSW will continue to assist with discharge as needs arise.  Santiago Bumpers, MSW, Latanya Presser [phone]

## 2016-07-18 NOTE — Discharge Summary (Addendum)
[facility] [place] NAME: Cristina [name]    MR#:  [id number]  DATE OF BIRTH:  [birth year]  DATE OF ADMISSION:  07/15/2016 ADMITTING PHYSICIAN: Bettey Costa, MD  DATE OF DISCHARGE: 07/18/16 PRIMARY CARE PHYSICIAN: Denton Lank, MD    ADMISSION DIAGNOSIS:  Lower urinary tract infectious disease [N39.0] Hyperglycemia [R73.9] Hypoxia [R09.02] HCAP (healthcare-associated pneumonia) [J18.9] Sepsis, due to unspecified organism ([place]) [A41.9] Fever, unspecified fever cause [R50.9]  DISCHARGE DIAGNOSIS:  Active Problems:   Sepsis ([place])  Acute on chronic diastolic congestive heart failure SECONDARY DIAGNOSIS:   Past Medical History:  Diagnosis Date  . Abdominal hernia   . Arthritis   . Asthma   . Bladder dysfunction   . Carpal tunnel syndrome   . CHF (congestive heart failure) ([place])   . COPD (chronic obstructive pulmonary disease) ([place])   . DDD (degenerative disc disease), lumbar   . Diabetes mellitus without complication ([place])   . Dyspnea   . Edema   . Fatty tumor   . GERD (gastroesophageal reflux disease)   . H/O colonoscopy   . H/O tubal ligation    hx btl  . History of cholecystectomy    hx of cholecystectomy  . HLD (hyperlipidemia)   . Hypertension   . Muscle cramps   . Neuropathy   . Obesity   . Recurrent boils   . Schizoaffective disorder ([place])   . Sleep apnea   . Stroke ([place])   . UTI (lower urinary tract infection)   . Vaginitis     HOSPITAL COURSE:   HPI : Cristina [name]  is a 56 y.o. female with a known history of Diabetes, chronic diastolic heart failure, COPD on 2 L of oxygen at night and OSA on CPAP at night who presents from assisted living facility due to hyperglycemia. Patient reportedly had a blood sugar reading 534. Patient was also complaining of a burning sensation and foul-smelling urine. Upon arrival to the emergency room her oxygen saturations were 80% and she had a rectal temperature of 100. Urine  analysis reveals UTI and chest x-ray reveals pneumonia.  1. Sepsis: Patient presents with fever of 102, hypoxia, tachypnea and tachycardia due to HCAP and UTI. Patient given gentle IVF, Clinically improving Blood cultures with coag-negative staph probably a contaminant, urine culture with Escherichia coli sensitive to ofloxacin's   2. HCAP: Clinically improving discontinue cefepime and start patient on levofloxacin ,discontinue cefepime. MRSA PCR negative d/c VANC  3. UTI: Given cefepime changed to levofloxacin urine culture with Escherichia coli sensitive to Cipro  4. Acute on chronic diastolic heart failure with LEE: One dose of IV Lasix given in ED. Clinically improved with IV Lasix and follow weights and I/o., Changed to 20 by mouth twice a day Echocardiogram -normal ejection fraction 65%, okay to discharge patient from cardiology standpoint Cardiology consult- [facility] follow-up Daily weight monitoring  5. OSA: CPAP ordered 6. COPD no signs of exacerbation 7. AKI: from sepsis and CHF Renal function improved. Avoid nephrotoxins  8. Uncontrolled DM from sepsis Patient is hypoglycemic Levemir dose titrated to 35 units twice a day , resume metformin  Diabetes coordinator follow-up  SSI and ADA diet along with outpatient long acting insulin. hga1c 10.4   PTs are pending home health PT , rw  DISCHARGE CONDITIONS:   srable  CONSULTS OBTAINED:  Treatment Team:  Dionisio David, MD   PROCEDURES none  DRUG ALLERGIES:   Allergies  Allergen Reactions  .  Shellfish-Derived Products Anaphylaxis and Shortness Of Breath    Stated by Patient  . Codeine   . Thorazine [Chlorpromazine] Swelling  . Tylenol With Codeine #3 [Acetaminophen-Codeine] Other (See Comments)    Hallucinations    DISCHARGE MEDICATIONS:   Current Discharge Medication List    START taking these medications   Details  levofloxacin (LEVAQUIN) 750 MG tablet Take 1 tablet (750 mg  total) by mouth daily. Qty: 7 tablet, Refills: 0    potassium chloride 20 MEQ TBCR Take 10 mEq by mouth daily. Qty: 30 tablet, Refills: 0    senna-docusate (SENOKOT-S) 8.6-50 MG tablet Take 1 tablet by mouth at bedtime as needed for mild constipation.      CONTINUE these medications which have CHANGED   Details  clonazePAM (KLONOPIN) 0.5 MG tablet Take 1 tablet (0.5 mg total) by mouth 2 (two) times daily. Qty: 15 tablet, Refills: 0    furosemide (LASIX) 20 MG tablet Take 1 tablet (20 mg total) by mouth 2 (two) times daily. Qty: 60 tablet, Refills: 0    insulin aspart (NOVOLOG) 100 UNIT/ML injection Inject 0-9 Units into the skin 3 (three) times daily with meals. CBG 70 - 120: 0 units CBG 121 - 150: 0 units CBG 151 - 200: 0 units CBG 201 - 250: 2 units CBG 251 - 300: 3 units CBG 301 - 350: 4 units CBG 351 - 400: 5 units Qty: 100 mL, Refills: 1    insulin detemir (LEVEMIR) 100 UNIT/ML injection Inject 0.35 mLs (35 Units total) into the skin 2 (two) times daily. Pt uses 50 units in the morning and 52 units at bedtime. Qty: 100 mL, Refills: 0    metoprolol tartrate (LOPRESSOR) 25 MG tablet Take 0.5 tablets (12.5 mg total) by mouth 2 (two) times daily.      CONTINUE these medications which have NOT CHANGED   Details  acetaminophen (TYLENOL) 500 MG tablet Take 500 mg by mouth every 8 (eight) hours as needed for mild pain or moderate pain.    aspirin EC 81 MG tablet Take 81 mg by mouth daily.    atorvastatin (LIPITOR) 20 MG tablet Take 20 mg by mouth at bedtime.    cetirizine (ZYRTEC) 10 MG tablet Take 10 mg by mouth daily as needed for allergies.    diphenoxylate-atropine (LOMOTIL) 2.5-0.025 MG tablet Take 1 tablet by mouth daily as needed for diarrhea or loose stools.    Fluticasone-Salmeterol (ADVAIR) 250-50 MCG/DOSE AEPB Inhale 1 puff into the lungs 2 (two) times daily.    haloperidol (HALDOL) 5 MG tablet Take 1 tablet (5 mg total) by mouth at bedtime. Qty: 30 tablet,  Refills: 0    haloperidol decanoate (HALDOL DECANOATE) 100 MG/ML injection Inject 1 mL (100 mg total) into the muscle every 30 (thirty) days. Qty: 1 mL, Refills: 0    meloxicam (MOBIC) 7.5 MG tablet Take 1 tablet (7.5 mg total) by mouth 2 (two) times daily. Qty: 60 tablet, Refills: 0    metFORMIN (GLUCOPHAGE-XR) 500 MG 24 hr tablet Take 500 mg by mouth 2 (two) times daily.    mirtazapine (REMERON) 45 MG tablet Take 1 tablet (45 mg total) by mouth at bedtime. Qty: 60 tablet, Refills: 0    nitroGLYCERIN (NITROSTAT) 0.3 MG SL tablet Place 0.3 mg under the tongue every 5 (five) minutes as needed for chest pain.    ondansetron (ZOFRAN) 4 MG tablet Take 4 mg by mouth every 6 (six) hours as needed for nausea or vomiting.  pantoprazole (PROTONIX) 40 MG tablet Take 40 mg by mouth 2 (two) times daily.    pregabalin (LYRICA) 100 MG capsule Take 100 mg by mouth 3 (three) times daily.    umeclidinium-vilanterol (ANORO ELLIPTA) 62.5-25 MCG/INH AEPB Inhale 1 puff into the lungs daily.    !! venlafaxine XR (EFFEXOR-XR) 150 MG 24 hr capsule Take 150 mg by mouth daily.    !! venlafaxine XR (EFFEXOR-XR) 75 MG 24 hr capsule Take 75 mg by mouth daily with breakfast.    albuterol (PROVENTIL HFA;VENTOLIN HFA) 108 (90 BASE) MCG/ACT inhaler Inhale 2 puffs into the lungs every 6 (six) hours as needed for wheezing or shortness of breath.     !! - Potential duplicate medications found. Please discuss with provider.       DISCHARGE INSTRUCTIONS:  Follow-up with primary care physician in 5-7 days Follow-up with cardiology Dr. Humphrey Rolls in 2 week Monitor weights daily Follow-up with outpatient CHF clinic in 3-4 days   DIET:  Cardiac diet and Diabetic diet  DISCHARGE CONDITION:  Stable  ACTIVITY:  Activity as tolerated per PT  OXYGEN:  Home Oxygen: No.   Oxygen Delivery: room air  DISCHARGE LOCATION:  home   If you experience worsening of your admission symptoms, develop shortness of breath,  life threatening emergency, suicidal or homicidal thoughts you must seek medical attention immediately by calling 911 or calling your MD immediately  if symptoms less severe.  You Must read complete instructions/literature along with all the possible adverse reactions/side effects for all the Medicines you take and that have been prescribed to you. Take any new Medicines after you have completely understood and accpet all the possible adverse reactions/side effects.   Please note  You were cared for by a hospitalist during your hospital stay. If you have any questions about your discharge medications or the care you received while you were in the hospital after you are discharged, you can call the unit and asked to speak with the hospitalist on call if the hospitalist that took care of you is not available. Once you are discharged, your primary care physician will handle any further medical issues. Please note that NO REFILLS for any discharge medications will be authorized once you are discharged, as it is imperative that you return to your primary care physician (or establish a relationship with a primary care physician if you do not have one) for your aftercare needs so that they can reassess your need for medications and monitor your lab values.     Today  Chief Complaint  Patient presents with  . Hyperglycemia  . Urinary Tract Infection   Patient is feeling fine. Denies any shortness of breath. Wants to go home.  ROS:  CONSTITUTIONAL: Denies fevers, chills. Denies any fatigue, weakness.  EYES: Denies blurry vision, double vision, eye pain. EARS, NOSE, THROAT: Denies tinnitus, ear pain, hearing loss. RESPIRATORY: Denies cough, wheeze, shortness of breath.  CARDIOVASCULAR: Denies chest pain, palpitations, edema.  GASTROINTESTINAL: Denies nausea, vomiting, diarrhea, abdominal pain. Denies bright red blood per rectum. GENITOURINARY: Denies dysuria, hematuria. ENDOCRINE: Denies nocturia or  thyroid problems. HEMATOLOGIC AND LYMPHATIC: Denies easy bruising or bleeding. SKIN: Denies rash or lesion. MUSCULOSKELETAL: Denies pain in neck, back, shoulder, knees, hips or arthritic symptoms.  NEUROLOGIC: Denies paralysis, paresthesias.  PSYCHIATRIC: Denies anxiety or depressive symptoms.   VITAL SIGNS:  Blood pressure 116/62, pulse 88, temperature 98.6 F (37 C), temperature source Oral, resp. rate 18, height 4\' 11"  (1.499 m), weight 115.6 kg (254 lb  12.8 oz), SpO2 95 %.  I/O:    Intake/Output Summary (Last 24 hours) at 07/18/16 1306 Last data filed at 07/18/16 1200  Gross per 24 hour  Intake              340 ml  Output             1200 ml  Net             -860 ml    PHYSICAL EXAMINATION:  GENERAL:  56 y.o.-year-old patient lying in the bed with no acute distress.  EYES: Pupils equal, round, reactive to light and accommodation. No scleral icterus. Extraocular muscles intact.  HEENT: Head atraumatic, normocephalic. Oropharynx and nasopharynx clear.  NECK:  Supple, no jugular venous distention. No thyroid enlargement, no tenderness.  LUNGS: Normal breath sounds bilaterally, no wheezing, rales,rhonchi or crepitation. No use of accessory muscles of respiration.  CARDIOVASCULAR: S1, S2 normal. No murmurs, rubs, or gallops.  ABDOMEN: Soft, non-tender, non-distended. Bowel sounds present. No organomegaly or mass.  EXTREMITIES: No pedal edema, cyanosis, or clubbing.  NEUROLOGIC: Cranial nerves II through XII are intact. Muscle strength 5/5 in all extremities. Sensation intact. Gait not checked.  PSYCHIATRIC: The patient is alert and oriented x 3.  SKIN: No obvious rash, lesion, or ulcer.   DATA REVIEW:   CBC  Recent Labs Lab 07/17/16 0355  WBC 8.5  HGB 8.4*  HCT 26.6*  PLT 223    Chemistries   Recent Labs Lab 07/15/16 0554  07/17/16 0355  NA 134*  < > 138  K 3.9  < > 3.8  CL 95*  < > 97*  CO2 28  < > 33*  GLUCOSE 416*  < > 219*  BUN 26*  < > 16  CREATININE  1.10*  < > 0.87  CALCIUM 7.9*  < > 7.6*  AST 92*  --   --   ALT 58*  --   --   ALKPHOS 149*  --   --   BILITOT 0.7  --   --   < > = values in this interval not displayed.  Cardiac Enzymes No results for input(s): TROPONINI in the last 168 hours.  Microbiology Results  Results for orders placed or performed during the hospital encounter of 07/15/16  Culture, blood (Routine x 2)     Status: None (Preliminary result)   Collection Time: 07/15/16  5:54 AM  Result Value Ref Range Status   Specimen Description BLOOD L AC  Final   Special Requests   Final    BOTTLES DRAWN AEROBIC AND ANAEROBIC Blood Culture adequate volume   Culture NO GROWTH 3 DAYS  Final   Report Status PENDING  Incomplete  Culture, blood (Routine x 2)     Status: Abnormal (Preliminary result)   Collection Time: 07/15/16  5:54 AM  Result Value Ref Range Status   Specimen Description BLOOD R HAND  Final   Special Requests   Final    BOTTLES DRAWN AEROBIC AND ANAEROBIC Blood Culture adequate volume   Culture  Setup Time   Final    GRAM POSITIVE COCCI ANAEROBIC BOTTLE ONLY CRITICAL RESULT CALLED TO, READ BACK BY AND VERIFIED WITH: MATT MCBANE AT 9604 ON 07/16/16 [place]. GRAM POSITIVE RODS AEROBIC BOTTLE ONLY CRITICAL RESULT CALLED TO, READ BACK BY AND VERIFIED WITH: MATT MCBANE @ 5409 ON 07/17/2016 BY [facility]    Culture (A)  Final    STAPHYLOCOCCUS SPECIES (COAGULASE NEGATIVE) THE SIGNIFICANCE OF ISOLATING THIS ORGANISM FROM  A SINGLE SET OF BLOOD CULTURES WHEN MULTIPLE SETS ARE DRAWN IS UNCERTAIN. PLEASE NOTIFY THE MICROBIOLOGY DEPARTMENT WITHIN ONE WEEK IF SPECIATION AND SENSITIVITIES ARE REQUIRED. GRAM POSITIVE RODS CULTURE REINCUBATED FOR BETTER GROWTH Performed at [facility], [place] [address], [place],  [address]    Report Status PENDING  Incomplete  Urine culture     Status: Abnormal   Collection Time: 07/15/16  5:54 AM  Result Value Ref Range Status   Specimen Description URINE, RANDOM  Final   Special  Requests NONE  Final   Culture >=100,000 COLONIES/mL ESCHERICHIA COLI (A)  Final   Report Status 07/17/2016 FINAL  Final   Organism ID, Bacteria ESCHERICHIA COLI (A)  Final      Susceptibility   Escherichia coli - MIC*    AMPICILLIN >=32 RESISTANT Resistant     CEFAZOLIN <=4 SENSITIVE Sensitive     CEFTRIAXONE <=1 SENSITIVE Sensitive     CIPROFLOXACIN <=0.25 SENSITIVE Sensitive     GENTAMICIN <=1 SENSITIVE Sensitive     IMIPENEM <=0.25 SENSITIVE Sensitive     NITROFURANTOIN <=16 SENSITIVE Sensitive     TRIMETH/SULFA <=20 SENSITIVE Sensitive     AMPICILLIN/SULBACTAM 16 INTERMEDIATE Intermediate     PIP/TAZO <=4 SENSITIVE Sensitive     Extended ESBL NEGATIVE Sensitive     * >=100,000 COLONIES/mL ESCHERICHIA COLI  Blood Culture ID Panel (Reflexed)     Status: Abnormal   Collection Time: 07/15/16  5:54 AM  Result Value Ref Range Status   Enterococcus species NOT DETECTED NOT DETECTED Final   Listeria monocytogenes NOT DETECTED NOT DETECTED Final   Staphylococcus species DETECTED (A) NOT DETECTED Final    Comment: Methicillin (oxacillin) susceptible coagulase negative staphylococcus. Possible blood culture contaminant (unless isolated from more than one blood culture draw or clinical case suggests pathogenicity). No antibiotic treatment is indicated for blood  culture contaminants. CRITICAL RESULT CALLED TO, READ BACK BY AND VERIFIED WITH: MATT MCBANE AT 3419 ON 07/16/16 [place].    Staphylococcus aureus NOT DETECTED NOT DETECTED Final   Methicillin resistance NOT DETECTED NOT DETECTED Final   Streptococcus species NOT DETECTED NOT DETECTED Final   Streptococcus agalactiae NOT DETECTED NOT DETECTED Final   Streptococcus pneumoniae NOT DETECTED NOT DETECTED Final   Streptococcus pyogenes NOT DETECTED NOT DETECTED Final   Acinetobacter baumannii NOT DETECTED NOT DETECTED Final   Enterobacteriaceae species NOT DETECTED NOT DETECTED Final   Enterobacter cloacae complex NOT DETECTED NOT  DETECTED Final   Escherichia coli NOT DETECTED NOT DETECTED Final   Klebsiella oxytoca NOT DETECTED NOT DETECTED Final   Klebsiella pneumoniae NOT DETECTED NOT DETECTED Final   Proteus species NOT DETECTED NOT DETECTED Final   Serratia marcescens NOT DETECTED NOT DETECTED Final   Haemophilus influenzae NOT DETECTED NOT DETECTED Final   Neisseria meningitidis NOT DETECTED NOT DETECTED Final   Pseudomonas aeruginosa NOT DETECTED NOT DETECTED Final   Candida albicans NOT DETECTED NOT DETECTED Final   Candida glabrata NOT DETECTED NOT DETECTED Final   Candida krusei NOT DETECTED NOT DETECTED Final   Candida parapsilosis NOT DETECTED NOT DETECTED Final   Candida tropicalis NOT DETECTED NOT DETECTED Final  Blood Culture ID Panel (Reflexed)     Status: None   Collection Time: 07/15/16  5:54 AM  Result Value Ref Range Status   Enterococcus species NOT DETECTED NOT DETECTED Final   Listeria monocytogenes NOT DETECTED NOT DETECTED Final   Staphylococcus species NOT DETECTED NOT DETECTED Final   Staphylococcus aureus NOT DETECTED NOT  DETECTED Final   Streptococcus species NOT DETECTED NOT DETECTED Final   Streptococcus agalactiae NOT DETECTED NOT DETECTED Final   Streptococcus pneumoniae NOT DETECTED NOT DETECTED Final   Streptococcus pyogenes NOT DETECTED NOT DETECTED Final   Acinetobacter baumannii NOT DETECTED NOT DETECTED Final   Enterobacteriaceae species NOT DETECTED NOT DETECTED Final   Enterobacter cloacae complex NOT DETECTED NOT DETECTED Final   Escherichia coli NOT DETECTED NOT DETECTED Final   Klebsiella oxytoca NOT DETECTED NOT DETECTED Final   Klebsiella pneumoniae NOT DETECTED NOT DETECTED Final   Proteus species NOT DETECTED NOT DETECTED Final   Serratia marcescens NOT DETECTED NOT DETECTED Final   Haemophilus influenzae NOT DETECTED NOT DETECTED Final   Neisseria meningitidis NOT DETECTED NOT DETECTED Final   Pseudomonas aeruginosa NOT DETECTED NOT DETECTED Final   Candida  albicans NOT DETECTED NOT DETECTED Final   Candida glabrata NOT DETECTED NOT DETECTED Final   Candida krusei NOT DETECTED NOT DETECTED Final   Candida parapsilosis NOT DETECTED NOT DETECTED Final   Candida tropicalis NOT DETECTED NOT DETECTED Final  MRSA PCR Screening     Status: None   Collection Time: 07/15/16  8:03 AM  Result Value Ref Range Status   MRSA by PCR NEGATIVE NEGATIVE Final    Comment:        The GeneXpert MRSA Assay (FDA approved for NASAL specimens only), is one component of a comprehensive MRSA colonization surveillance program. It is not intended to diagnose MRSA infection nor to guide or monitor treatment for MRSA infections.     RADIOLOGY:  Dg Chest Port 1 View  Result Date: 07/15/2016 CLINICAL DATA:  Acute onset of hyperglycemia and decreased O2 saturation. Initial encounter. EXAM: PORTABLE CHEST 1 VIEW COMPARISON:  Chest radiograph from 06/16/2016 FINDINGS: The lungs are well-aerated. Mild bibasilar airspace opacities raise concern for pneumonia. There is no evidence of pleural effusion or pneumothorax. The cardiomediastinal silhouette is mildly enlarged. No acute osseous abnormalities are seen. IMPRESSION: Mild bibasilar airspace opacities raise concern for pneumonia. Mild cardiomegaly. Electronically Signed   By: Garald Balding M.D.   On: 07/15/2016 06:11    EKG:   Orders placed or performed during the hospital encounter of 07/15/16  . EKG 12-Lead  . EKG 12-Lead  . ED EKG 12-Lead  . ED EKG 12-Lead  . EKG 12-Lead  . EKG 12-Lead      Management plans discussed with the patient, family and they are in agreement.  CODE STATUS:     Code Status Orders        Start     Ordered   07/15/16 0749  Full code  Continuous     07/15/16 0748    Code Status History    Date Active Date Inactive Code Status Order ID Comments User Context   10/28/2015 11:40 PM 10/31/2015  6:52 PM Full Code [id number]  Holley Raring, NP ED   07/30/2015 11:33 PM 08/03/2015   3:41 PM Full Code [id number]  Gonzella Lex, MD Inpatient   12/05/2014  2:25 AM 12/07/2014  8:38 PM Full Code [id number]  Hildred Priest, MD Inpatient   09/24/2014  9:18 AM 09/24/2014  5:37 PM Full Code [id number]  Dionisio David, MD Inpatient   09/23/2014  7:58 PM 09/24/2014  9:18 AM Full Code [id number]  Dustin Flock, MD Inpatient      TOTAL TIME TAKING CARE OF THIS PATIENT: 45 minutes.   Note: This dictation was prepared with Dragon dictation along with smaller phrase technology.  Any transcriptional errors that result from this process are unintentional.   @[facility] @  on 07/18/2016 at 1:06 PM  Between 7am to 6pm - Pager - [phone]  After 6pm go to [email or web address] - password [email or web address] [facility]  [facility]  Office  [phone]  CC: Primary care physician; Denton Lank, MD

## 2016-07-18 NOTE — Plan of Care (Signed)
Problem: Health Behavior/Discharge Planning: Goal: Ability to manage health-related needs will improve Outcome: Progressing Returning to [facility] at discharge.

## 2016-07-18 NOTE — Progress Notes (Signed)
Pt discharged via her daughter to [facility]. Report called to accepting RN. IV removed, pt on room air and in no distress. Ammie Dalton, RN

## 2016-07-18 NOTE — Care Management Note (Addendum)
Case Management Note  Patient Details  Name: Rande Dario MRN: [id number] Date of Birth: [birth year]  Subjective/Objective:         A referral for HH=PT, RN, Aide, and SW was called to [place] [facility] which provides [facility] services to residents of [facility]. Current discharge plan is for Ms Greer's daughter to transport Ms Anschutz to [facility].           Action/Plan:   Expected Discharge Date:  07/18/16               Expected Discharge Plan:   06//02/18  In-House Referral:     Discharge planning Services     Post Acute Care Choice:   yes Choice offered to:   Patient  DME Arranged:   NA  (RW available at [employer]) DME Agency:   NA  HH Arranged:   PT, RN, Aide, SW HH Agency:   [facility]  Status of Service:   Completed  If discussed at [employer] [facility] [employer], dates discussed:    Additional Comments:  Dajour Pierpoint A, RN 07/18/2016, 11:58 AM

## 2016-07-20 LAB — CULTURE, BLOOD (ROUTINE X 2)
Culture: NO GROWTH
Special Requests: ADEQUATE

## 2016-08-11 ENCOUNTER — Emergency Department
Admission: EM | Admit: 2016-08-11 | Discharge: 2016-08-12 | Disposition: A | Discharge status: Home / Self Care | Payer: Medicare Other | Attending: Emergency Medicine | Admitting: Emergency Medicine

## 2016-08-11 ENCOUNTER — Emergency Department: Payer: Medicare Other

## 2016-08-11 DIAGNOSIS — J45909 Unspecified asthma, uncomplicated: Secondary | ICD-10-CM | POA: Insufficient documentation

## 2016-08-11 DIAGNOSIS — Z79899 Other long term (current) drug therapy: Secondary | ICD-10-CM | POA: Diagnosis not present

## 2016-08-11 DIAGNOSIS — I509 Heart failure, unspecified: Secondary | ICD-10-CM | POA: Diagnosis not present

## 2016-08-11 DIAGNOSIS — Z7984 Long term (current) use of oral hypoglycemic drugs: Secondary | ICD-10-CM | POA: Diagnosis not present

## 2016-08-11 DIAGNOSIS — J449 Chronic obstructive pulmonary disease, unspecified: Secondary | ICD-10-CM | POA: Insufficient documentation

## 2016-08-11 DIAGNOSIS — Z7982 Long term (current) use of aspirin: Secondary | ICD-10-CM | POA: Diagnosis not present

## 2016-08-11 DIAGNOSIS — Z794 Long term (current) use of insulin: Secondary | ICD-10-CM | POA: Insufficient documentation

## 2016-08-11 DIAGNOSIS — I110 Hypertensive heart disease with heart failure: Secondary | ICD-10-CM | POA: Diagnosis not present

## 2016-08-11 DIAGNOSIS — Z87891 Personal history of nicotine dependence: Secondary | ICD-10-CM | POA: Insufficient documentation

## 2016-08-11 DIAGNOSIS — R079 Chest pain, unspecified: Secondary | ICD-10-CM | POA: Diagnosis not present

## 2016-08-11 DIAGNOSIS — E1140 Type 2 diabetes mellitus with diabetic neuropathy, unspecified: Secondary | ICD-10-CM | POA: Insufficient documentation

## 2016-08-11 LAB — CBC
HCT: 25.8 % — ABNORMAL LOW (ref 35.0–47.0)
Hemoglobin: 8.3 g/dL — ABNORMAL LOW (ref 12.0–16.0)
MCH: 23 pg — ABNORMAL LOW (ref 26.0–34.0)
MCHC: 32.1 g/dL (ref 32.0–36.0)
MCV: 71.6 fL — ABNORMAL LOW (ref 80.0–100.0)
Platelets: 197 10*3/uL (ref 150–440)
RBC: 3.6 MIL/uL — ABNORMAL LOW (ref 3.80–5.20)
RDW: 17.4 % — ABNORMAL HIGH (ref 11.5–14.5)
WBC: 8.3 10*3/uL (ref 3.6–11.0)

## 2016-08-11 LAB — BASIC METABOLIC PANEL
Anion gap: 7 (ref 5–15)
BUN: 8 mg/dL (ref 6–20)
CO2: 29 mmol/L (ref 22–32)
Calcium: 8.6 mg/dL — ABNORMAL LOW (ref 8.9–10.3)
Chloride: 98 mmol/L — ABNORMAL LOW (ref 101–111)
Creatinine, Ser: 0.84 mg/dL (ref 0.44–1.00)
GFR calc Af Amer: >60 mL/min (ref >60)
GFR calc non Af Amer: >60 mL/min (ref >60)
Glucose, Bld: 212 mg/dL — ABNORMAL HIGH (ref 65–99)
Potassium: 3.5 mmol/L (ref 3.5–5.1)
Sodium: 134 mmol/L — ABNORMAL LOW (ref 135–145)

## 2016-08-11 LAB — TROPONIN I: Troponin I: <0.03 ng/mL (ref <0.03)

## 2016-08-11 NOTE — ED Notes (Signed)
Pt refusing to wear heart monitor and refusing vital signs repeatedly.

## 2016-08-11 NOTE — ED Triage Notes (Addendum)
Pt bib EMS w/ c/o CP that started 3 hours ago. Per EMS, pt took 1 NTG at home, unsure when and received 2 NTG w/ EMS as well as 324 ASA.  Pt reports mid sternal CP, non reproducible and no radiation.  Pt A/O. Pt reports SOB, nausea, dizziness and lightheadedness.  20 G EMS cbg 213  Pt from [facility]

## 2016-08-11 NOTE — ED Provider Notes (Signed)
[facility] Emergency Department Provider Note   ____________________________________________   I have reviewed the triage vital signs and the nursing notes.   HISTORY  Chief Complaint Chest Pain   History limited by: Poor historian   HPI Cristina [name] is a 56 y.o. female who presents to the emergency department today because of concerns for chest pain. Patient arrives via EMS. The patient states the chest pain started roughly 3 hours prior to my evaluation. It was located in the left side of her chest. She describes it as sharp. It is worse with deep breaths. Patient did have some associated shortness of breath. Patient states he has a history of myocardial infarction states this pain is somewhat similar. She denies any recent illness.   Past Medical History:  Diagnosis Date  . Abdominal hernia   . Arthritis   . Asthma   . Bladder dysfunction   . Carpal tunnel syndrome   . CHF (congestive heart failure) ([place])   . COPD (chronic obstructive pulmonary disease) ([place])   . DDD (degenerative disc disease), lumbar   . Diabetes mellitus without complication ([place])   . Dyspnea   . Edema   . Fatty tumor   . GERD (gastroesophageal reflux disease)   . H/O colonoscopy   . H/O tubal ligation    hx btl  . History of cholecystectomy    hx of cholecystectomy  . HLD (hyperlipidemia)   . Hypertension   . Muscle cramps   . Neuropathy   . Obesity   . Recurrent boils   . Schizoaffective disorder ([place])   . Sleep apnea   . Stroke ([place])   . UTI (lower urinary tract infection)   . Vaginitis     Patient Active Problem List   Diagnosis Date Noted  . Sepsis ([place]) 07/15/2016  . Septic shock ([place]) 10/28/2015  . COPD (chronic obstructive pulmonary disease) ([place]) 07/31/2015  . Aspirin overdose 07/30/2015  . Schizoaffective disorder, bipolar type ([place]) 12/05/2014  . HTN (hypertension) 12/05/2014  . Sleep apnea 12/05/2014  . Diabetes ([place]) 12/05/2014  . Incomplete  bladder emptying 12/04/2014  . Recurrent UTI 12/04/2014  . Polyneuropathy 03/20/2009  . Arthritis, degenerative 05/23/2007  . HLD (hyperlipidemia) 05/23/2007    Past Surgical History:  Procedure Laterality Date  . CARDIAC CATHETERIZATION Left 09/24/2014   Procedure: Left Heart Cath and Coronary Angiography;  Surgeon: Dionisio David, MD;  Location: [place] [facility];  Service: Cardiovascular;  Laterality: Left;  . CHOLECYSTECTOMY    . COLONOSCOPY WITH PROPOFOL N/A 08/12/2015   Procedure: COLONOSCOPY WITH PROPOFOL;  Surgeon: Lollie Sails, MD;  Location: [facility] ENDOSCOPY;  Service: Endoscopy;  Laterality: N/A;  . ESOPHAGOGASTRODUODENOSCOPY (EGD) WITH PROPOFOL N/A 08/12/2015   Procedure: ESOPHAGOGASTRODUODENOSCOPY (EGD) WITH PROPOFOL;  Surgeon: Lollie Sails, MD;  Location: [facility] ENDOSCOPY;  Service: Endoscopy;  Laterality: N/A;  . tubal ligation      Prior to Admission medications   Medication Sig Start Date End Date Taking? Authorizing Provider  acetaminophen (TYLENOL) 500 MG tablet Take 500 mg by mouth every 8 (eight) hours as needed for mild pain or moderate pain.   Yes [provider]  albuterol (PROVENTIL HFA;VENTOLIN HFA) 108 (90 BASE) MCG/ACT inhaler Inhale 2 puffs into the lungs every 6 (six) hours as needed for wheezing or shortness of breath.   Yes [provider]  aspirin EC 81 MG tablet Take 81 mg by mouth daily.   Yes [provider]  atorvastatin (LIPITOR) 20 MG tablet  Take 20 mg by mouth at bedtime.   Yes [provider]  cetirizine (ZYRTEC) 10 MG tablet Take 10 mg by mouth daily.    Yes [provider]  clonazePAM (KLONOPIN) 0.5 MG tablet Take 1 tablet (0.5 mg total) by mouth 2 (two) times daily. 07/18/16  Yes Gouru, Illene Silver, MD  diphenoxylate-atropine (LOMOTIL) 2.5-0.025 MG tablet Take 1 tablet by mouth daily as needed for diarrhea or loose stools.   Yes [provider]  Fluticasone-Salmeterol (ADVAIR) 250-50 MCG/DOSE  AEPB Inhale 1 puff into the lungs 2 (two) times daily.   Yes [provider]  furosemide (LASIX) 20 MG tablet Take 1 tablet (20 mg total) by mouth 2 (two) times daily. 07/18/16  Yes Gouru, Illene Silver, MD  haloperidol (HALDOL) 5 MG tablet Take 1 tablet (5 mg total) by mouth at bedtime. 10/31/15  Yes Bettey Costa, MD  haloperidol decanoate (HALDOL DECANOATE) 100 MG/ML injection Inject 1 mL (100 mg total) into the muscle every 30 (thirty) days. 08/02/15  Yes Hildred Priest, MD  insulin aspart (NOVOLOG) 100 UNIT/ML injection Inject 0-9 Units into the skin 3 (three) times daily with meals. CBG 70 - 120: 0 units CBG 121 - 150: 0 units CBG 151 - 200: 0 units CBG 201 - 250: 2 units CBG 251 - 300: 3 units CBG 301 - 350: 4 units CBG 351 - 400: 5 units 07/18/16  Yes Gouru, Aruna, MD  insulin detemir (LEVEMIR) 100 UNIT/ML injection Inject 0.35 mLs (35 Units total) into the skin 2 (two) times daily. Pt uses 50 units in the morning and 52 units at bedtime. Patient taking differently: Inject 35 Units into the skin 2 (two) times daily.  07/18/16  Yes Gouru, Illene Silver, MD  loperamide (IMODIUM A-D) 2 MG tablet Take 2 mg by mouth 4 (four) times daily as needed for diarrhea or loose stools.   Yes [provider]  meloxicam (MOBIC) 7.5 MG tablet Take 1 tablet (7.5 mg total) by mouth 2 (two) times daily. 08/02/15  Yes Hildred Priest, MD  menthol-cetylpyridinium (CEPACOL) 3 MG lozenge Take 1 lozenge by mouth as needed for sore throat.   Yes [provider]  metFORMIN (GLUCOPHAGE-XR) 500 MG 24 hr tablet Take 500 mg by mouth 2 (two) times daily. 05/23/15  Yes [provider]  metoprolol tartrate (LOPRESSOR) 25 MG tablet Take 0.5 tablets (12.5 mg total) by mouth 2 (two) times daily. Patient taking differently: Take 50 mg by mouth 2 (two) times daily.  07/18/16  Yes Gouru, Illene Silver, MD  mirtazapine (REMERON) 45 MG tablet Take 1 tablet (45 mg total) by mouth at bedtime. 10/31/15  Yes Mody,  Ulice Bold, MD  nitroGLYCERIN (NITROSTAT) 0.3 MG SL tablet Place 0.3 mg under the tongue every 5 (five) minutes as needed for chest pain.   Yes [provider]  ondansetron (ZOFRAN) 4 MG tablet Take 4 mg by mouth every 6 (six) hours as needed for nausea or vomiting.   Yes [provider]  pantoprazole (PROTONIX) 40 MG tablet Take 40 mg by mouth 2 (two) times daily.   Yes [provider]  potassium chloride 20 MEQ TBCR Take 10 mEq by mouth daily. 07/18/16  Yes Gouru, Illene Silver, MD  pregabalin (LYRICA) 100 MG capsule Take 100 mg by mouth 3 (three) times daily.   Yes [provider]  senna-docusate (SENOKOT-S) 8.6-50 MG tablet Take 1 tablet by mouth at bedtime as needed for mild constipation. 07/18/16  Yes Gouru, Illene Silver, MD  umeclidinium-vilanterol (ANORO ELLIPTA) 62.5-25 MCG/INH  AEPB Inhale 1 puff into the lungs daily.   Yes [provider]  venlafaxine XR (EFFEXOR-XR) 75 MG 24 hr capsule Take 225 mg by mouth daily with breakfast.    Yes [provider]    Allergies Shellfish-derived products; Codeine; Thorazine [chlorpromazine]; and Tylenol with codeine #3 [acetaminophen-codeine]  Family History  Problem Relation Age of Onset  . CAD Mother     Social History Social History  Substance Use Topics  . Smoking status: Former Research scientist (life sciences)  . Smokeless tobacco: Never Used  . Alcohol use No    Review of Systems Constitutional: No fever/chills Eyes: No visual changes. ENT: No sore throat. Cardiovascular: Positive chest pain. Respiratory: Positive shortness of breath. Gastrointestinal: No abdominal pain.  No nausea, no vomiting.  No diarrhea.   Genitourinary: Negative for dysuria. Musculoskeletal: Negative for back pain. Skin: Negative for rash. Neurological: Negative for headaches, focal weakness or numbness.  ____________________________________________   PHYSICAL EXAM:  VITAL SIGNS: ED Triage Vitals [08/11/16 2022]  Enc Vitals Group     BP  148/69     Pulse 87     Resp 17     Temp 98.5     Temp src      SpO2 91     Weight 250 lb (113.4 kg)     Height 4\' 11"  (1.499 m)     Head Circumference      Peak Flow      Pain Score 10    Constitutional: Alert and oriented. Well appearing and in no distress. Eyes: Conjunctivae are normal.  ENT   Head: Normocephalic and atraumatic.   Nose: No congestion/rhinnorhea.   Mouth/Throat: Mucous membranes are moist.   Neck: No stridor. Hematological/Lymphatic/Immunilogical: No cervical lymphadenopathy. Cardiovascular: Normal rate, regular rhythm.  No murmurs, rubs, or gallops.  Respiratory: Normal respiratory effort without tachypnea nor retractions. Breath sounds are clear and equal bilaterally. No wheezes/rales/rhonchi. Gastrointestinal: Soft and non tender. No rebound. No guarding.  Genitourinary: Deferred Musculoskeletal: Normal range of motion in all extremities. No lower extremity edema. Neurologic:  Normal speech and language. No gross focal neurologic deficits are appreciated.  Skin:  Skin is warm, dry and intact. No rash noted. Psychiatric: Mood and affect are normal. Speech and behavior are normal. Patient exhibits appropriate insight and judgment.  ____________________________________________    LABS (pertinent positives/negatives)  Labs Reviewed  BASIC METABOLIC PANEL - Abnormal; Notable for the following:       Result Value   Sodium 134 (*)    Chloride 98 (*)    Glucose, Bld 212 (*)    Calcium 8.6 (*)    All other components within normal limits  CBC - Abnormal; Notable for the following:    RBC 3.60 (*)    Hemoglobin 8.3 (*)    HCT 25.8 (*)    MCV 71.6 (*)    MCH 23.0 (*)    RDW 17.4 (*)    All other components within normal limits  TROPONIN I     ____________________________________________   EKG  I, Nance Pear, attending physician, personally viewed and interpreted this EKG  EKG Time: 2025 Rate: 81 Rhythm: sinus rhythm Axis:  left axis deviation Intervals: qtc 459 QRS: RBBB ST changes: no st elevation Impression: abnormal ekg   ____________________________________________    RADIOLOGY  CXR IMPRESSION:  No acute cardiopulmonary process.    ____________________________________________   PROCEDURES  Procedures  ____________________________________________   INITIAL IMPRESSION / ASSESSMENT AND PLAN / ED COURSE  Pertinent labs & imaging results  that were available during my care of the patient were reviewed by me and considered in my medical decision making (see chart for details).  Patient presented to the emergency department today because of concerns for chest pain. Patient does have history of MI. Initial workup negative. I did discuss with patient and offered admission given high risk. Patient however declined admission. She stated she wanted to go home. She was agreeable to stay for a second troponin.  ____________________________________________   FINAL CLINICAL IMPRESSION(S) / ED DIAGNOSES  Final diagnoses:  Nonspecific chest pain     Note: This dictation was prepared with Dragon dictation. Any transcriptional errors that result from this process are unintentional     Nance Pear, MD 08/12/16 [phone]

## 2016-08-12 DIAGNOSIS — R079 Chest pain, unspecified: Secondary | ICD-10-CM | POA: Diagnosis not present

## 2016-08-12 LAB — TROPONIN I: Troponin I: <0.03 ng/mL (ref <0.03)

## 2016-08-12 NOTE — Discharge Instructions (Signed)
Please seek medical attention for any high fevers, chest pain, shortness of breath, change in behavior, persistent vomiting, bloody stool or any other new or concerning symptoms.  

## 2016-11-10 ENCOUNTER — Emergency Department
Admission: EM | Admit: 2016-11-10 | Discharge: 2016-11-10 | Disposition: A | Discharge status: Home / Self Care | Payer: Medicare Other | Attending: Emergency Medicine | Admitting: Emergency Medicine

## 2016-11-10 ENCOUNTER — Emergency Department: Payer: Medicare Other

## 2016-11-10 ENCOUNTER — Encounter: Payer: Self-pay | Admitting: Emergency Medicine

## 2016-11-10 DIAGNOSIS — Y93F9 Activity, other caregiving: Secondary | ICD-10-CM | POA: Insufficient documentation

## 2016-11-10 DIAGNOSIS — Z79899 Other long term (current) drug therapy: Secondary | ICD-10-CM | POA: Insufficient documentation

## 2016-11-10 DIAGNOSIS — Z8673 Personal history of transient ischemic attack (TIA), and cerebral infarction without residual deficits: Secondary | ICD-10-CM | POA: Diagnosis not present

## 2016-11-10 DIAGNOSIS — Z87891 Personal history of nicotine dependence: Secondary | ICD-10-CM | POA: Diagnosis not present

## 2016-11-10 DIAGNOSIS — Z791 Long term (current) use of non-steroidal anti-inflammatories (NSAID): Secondary | ICD-10-CM | POA: Insufficient documentation

## 2016-11-10 DIAGNOSIS — W010XXA Fall on same level from slipping, tripping and stumbling without subsequent striking against object, initial encounter: Secondary | ICD-10-CM | POA: Diagnosis not present

## 2016-11-10 DIAGNOSIS — Y92129 Unspecified place in nursing home as the place of occurrence of the external cause: Secondary | ICD-10-CM | POA: Insufficient documentation

## 2016-11-10 DIAGNOSIS — Y999 Unspecified external cause status: Secondary | ICD-10-CM | POA: Insufficient documentation

## 2016-11-10 DIAGNOSIS — S3992XA Unspecified injury of lower back, initial encounter: Secondary | ICD-10-CM

## 2016-11-10 DIAGNOSIS — I110 Hypertensive heart disease with heart failure: Secondary | ICD-10-CM | POA: Insufficient documentation

## 2016-11-10 DIAGNOSIS — Z7982 Long term (current) use of aspirin: Secondary | ICD-10-CM | POA: Insufficient documentation

## 2016-11-10 DIAGNOSIS — Z794 Long term (current) use of insulin: Secondary | ICD-10-CM | POA: Insufficient documentation

## 2016-11-10 DIAGNOSIS — J45909 Unspecified asthma, uncomplicated: Secondary | ICD-10-CM | POA: Diagnosis not present

## 2016-11-10 DIAGNOSIS — E119 Type 2 diabetes mellitus without complications: Secondary | ICD-10-CM | POA: Insufficient documentation

## 2016-11-10 DIAGNOSIS — J449 Chronic obstructive pulmonary disease, unspecified: Secondary | ICD-10-CM | POA: Insufficient documentation

## 2016-11-10 DIAGNOSIS — I509 Heart failure, unspecified: Secondary | ICD-10-CM | POA: Insufficient documentation

## 2016-11-10 LAB — URINALYSIS, COMPLETE (UACMP) WITH MICROSCOPIC
Bilirubin Urine: NEGATIVE
Glucose, UA: NEGATIVE mg/dL
Hgb urine dipstick: NEGATIVE
Ketones, ur: NEGATIVE mg/dL
Nitrite: NEGATIVE
Protein, ur: NEGATIVE mg/dL
RBC / HPF: NONE SEEN RBC/hpf (ref 0–5)
Specific Gravity, Urine: 1.002 — ABNORMAL LOW (ref 1.005–1.030)
Squamous Epithelial / LPF: NONE SEEN
pH: 6 (ref 5.0–8.0)

## 2016-11-10 NOTE — Discharge Instructions (Signed)
Return to the ER for new or worsening pain, new or worsening weakness or numbness in the legs, urinary incontinence, difficulty walking, or any other new or worsening symptoms that concern you. You should  keep an eye on the lesion on the lower back, and a follow-up with a dermatologist in the next few weeks if it does not go away, or return to the ER if it worsens. Make an appointment to follow up with your primary care doctor as well.

## 2016-11-10 NOTE — ED Triage Notes (Signed)
Patient presents to ED via [facility] from [facility] post fall. Patient states she tripped over her own feet. Patient reports 10/10 back pain. Ambulatory on arrival with cane.

## 2016-11-10 NOTE — ED Notes (Signed)
Patient transported to CT 

## 2016-11-10 NOTE — ED Provider Notes (Signed)
[facility] Emergency Department Provider Note ____________________________________________   First MD Initiated Contact with Patient 11/10/16 1945     (approximate)  I have reviewed the triage vital signs and the nursing notes.   HISTORY  Chief Complaint Fall    HPI Cristina [name] is a 56 y.o. female With extensive past medical history as below who presents with back pain acute onset several hours ago after a fall onto her back at her facility. Patient states that she was helping someone through a door, and that her legs gave out on her causing her to fall onto her back. Patient denies hitting her head. She states that she had a knee injury a few days ago, and the knee intermittently gives out on her. Patient denies any lightheadedness or dizziness, weakness, near syncope or syncope.    Past Medical History:  Diagnosis Date  . Abdominal hernia   . Arthritis   . Asthma   . Bladder dysfunction   . Carpal tunnel syndrome   . CHF (congestive heart failure) ([place])   . COPD (chronic obstructive pulmonary disease) ([place])   . DDD (degenerative disc disease), lumbar   . Diabetes mellitus without complication ([place])   . Dyspnea   . Edema   . Fatty tumor   . GERD (gastroesophageal reflux disease)   . H/O colonoscopy   . H/O tubal ligation    hx btl  . History of cholecystectomy    hx of cholecystectomy  . HLD (hyperlipidemia)   . Hypertension   . Muscle cramps   . Neuropathy   . Obesity   . Recurrent boils   . Schizoaffective disorder ([place])   . Sleep apnea   . Stroke ([place])   . UTI (lower urinary tract infection)   . Vaginitis     Patient Active Problem List   Diagnosis Date Noted  . Sepsis ([place]) 07/15/2016  . Septic shock ([place]) 10/28/2015  . COPD (chronic obstructive pulmonary disease) ([place]) 07/31/2015  . Aspirin overdose 07/30/2015  . Schizoaffective disorder, bipolar type ([place]) 12/05/2014  . HTN (hypertension) 12/05/2014  . Sleep apnea  12/05/2014  . Diabetes ([place]) 12/05/2014  . Incomplete bladder emptying 12/04/2014  . Recurrent UTI 12/04/2014  . Polyneuropathy 03/20/2009  . Arthritis, degenerative 05/23/2007  . HLD (hyperlipidemia) 05/23/2007    Past Surgical History:  Procedure Laterality Date  . CARDIAC CATHETERIZATION Left 09/24/2014   Procedure: Left Heart Cath and Coronary Angiography;  Surgeon: Dionisio David, MD;  Location: [facility];  Service: Cardiovascular;  Laterality: Left;  . CHOLECYSTECTOMY    . COLONOSCOPY WITH PROPOFOL N/A 08/12/2015   Procedure: COLONOSCOPY WITH PROPOFOL;  Surgeon: Lollie Sails, MD;  Location: [facility] ENDOSCOPY;  Service: Endoscopy;  Laterality: N/A;  . ESOPHAGOGASTRODUODENOSCOPY (EGD) WITH PROPOFOL N/A 08/12/2015   Procedure: ESOPHAGOGASTRODUODENOSCOPY (EGD) WITH PROPOFOL;  Surgeon: Lollie Sails, MD;  Location: [facility] ENDOSCOPY;  Service: Endoscopy;  Laterality: N/A;  . tubal ligation      Prior to Admission medications   Medication Sig Start Date End Date Taking? Authorizing Provider  acetaminophen (TYLENOL) 500 MG tablet Take 500 mg by mouth every 8 (eight) hours as needed for mild pain or moderate pain.    [provider]  albuterol (PROVENTIL HFA;VENTOLIN HFA) 108 (90 BASE) MCG/ACT inhaler Inhale 2 puffs into the lungs every 6 (six) hours as needed for wheezing or shortness of breath.    [provider]  aspirin EC 81 MG tablet Take 81 mg by  mouth daily.    [provider]  atorvastatin (LIPITOR) 20 MG tablet Take 20 mg by mouth at bedtime.    [provider]  cetirizine (ZYRTEC) 10 MG tablet Take 10 mg by mouth daily.     [provider]  clonazePAM (KLONOPIN) 0.5 MG tablet Take 1 tablet (0.5 mg total) by mouth 2 (two) times daily. 07/18/16   Nicholes Mango, MD  diphenoxylate-atropine (LOMOTIL) 2.5-0.025 MG tablet Take 1 tablet by mouth daily as needed for diarrhea or loose stools.    [provider]    Fluticasone-Salmeterol (ADVAIR) 250-50 MCG/DOSE AEPB Inhale 1 puff into the lungs 2 (two) times daily.    [provider]  furosemide (LASIX) 20 MG tablet Take 1 tablet (20 mg total) by mouth 2 (two) times daily. 07/18/16   Nicholes Mango, MD  haloperidol (HALDOL) 5 MG tablet Take 1 tablet (5 mg total) by mouth at bedtime. 10/31/15   Bettey Costa, MD  haloperidol decanoate (HALDOL DECANOATE) 100 MG/ML injection Inject 1 mL (100 mg total) into the muscle every 30 (thirty) days. 08/02/15   Hildred Priest, MD  insulin aspart (NOVOLOG) 100 UNIT/ML injection Inject 0-9 Units into the skin 3 (three) times daily with meals. CBG 70 - 120: 0 units CBG 121 - 150: 0 units CBG 151 - 200: 0 units CBG 201 - 250: 2 units CBG 251 - 300: 3 units CBG 301 - 350: 4 units CBG 351 - 400: 5 units 07/18/16   Gouru, Aruna, MD  insulin detemir (LEVEMIR) 100 UNIT/ML injection Inject 0.35 mLs (35 Units total) into the skin 2 (two) times daily. Pt uses 50 units in the morning and 52 units at bedtime. Patient taking differently: Inject 35 Units into the skin 2 (two) times daily.  07/18/16   Nicholes Mango, MD  loperamide (IMODIUM A-D) 2 MG tablet Take 2 mg by mouth 4 (four) times daily as needed for diarrhea or loose stools.    [provider]  meloxicam (MOBIC) 7.5 MG tablet Take 1 tablet (7.5 mg total) by mouth 2 (two) times daily. 08/02/15   Hildred Priest, MD  menthol-cetylpyridinium (CEPACOL) 3 MG lozenge Take 1 lozenge by mouth as needed for sore throat.    [provider]  metFORMIN (GLUCOPHAGE-XR) 500 MG 24 hr tablet Take 500 mg by mouth 2 (two) times daily. 05/23/15   [provider]  metoprolol tartrate (LOPRESSOR) 25 MG tablet Take 0.5 tablets (12.5 mg total) by mouth 2 (two) times daily. Patient taking differently: Take 50 mg by mouth 2 (two) times daily.  07/18/16   Nicholes Mango, MD  mirtazapine (REMERON) 45 MG tablet Take 1 tablet (45 mg total) by mouth at bedtime.  10/31/15   Bettey Costa, MD  nitroGLYCERIN (NITROSTAT) 0.3 MG SL tablet Place 0.3 mg under the tongue every 5 (five) minutes as needed for chest pain.    [provider]  ondansetron (ZOFRAN) 4 MG tablet Take 4 mg by mouth every 6 (six) hours as needed for nausea or vomiting.    [provider]  pantoprazole (PROTONIX) 40 MG tablet Take 40 mg by mouth 2 (two) times daily.    [provider]  potassium chloride 20 MEQ TBCR Take 10 mEq by mouth daily. 07/18/16   Gouru, Illene Silver, MD  pregabalin (LYRICA) 100 MG capsule Take 100 mg by mouth 3 (three) times daily.    [provider]  senna-docusate (SENOKOT-S) 8.6-50 MG tablet Take 1 tablet by mouth at bedtime as needed  for mild constipation. 07/18/16   Gouru, Illene Silver, MD  umeclidinium-vilanterol (ANORO ELLIPTA) 62.5-25 MCG/INH AEPB Inhale 1 puff into the lungs daily.    [provider]  venlafaxine XR (EFFEXOR-XR) 75 MG 24 hr capsule Take 225 mg by mouth daily with breakfast.     [provider]    Allergies Shellfish-derived products; Codeine; Thorazine [chlorpromazine]; and Tylenol with codeine #3 [acetaminophen-codeine]  Family History  Problem Relation Age of Onset  . CAD Mother     Social History Social History  Substance Use Topics  . Smoking status: Former Research scientist (life sciences)  . Smokeless tobacco: Never Used  . Alcohol use No    Review of Systems  Constitutional: No fever Eyes: No visual changes. ENT: No sore throat. Cardiovascular: Denies chest pain. Respiratory: Positive for chronic shortness of breath. Gastrointestinal: No nausea, no vomiting.  No diarrhea.  Genitourinary: Positive for suprapubic pain Musculoskeletal: Positive for back pain. Skin: Negative for rash. Neurological: Negative for headaches.   ____________________________________________   PHYSICAL EXAM:  VITAL SIGNS: ED Triage Vitals  Enc Vitals Group     BP 11/10/16 1930 (!) 147/74     Pulse Rate 11/10/16 1930 90      Resp 11/10/16 1930 17     Temp 11/10/16 1930 98.7 F (37.1 C)     Temp Source 11/10/16 1930 Oral     SpO2 11/10/16 1930 95 %     Weight 11/10/16 1925 220 lb (99.8 kg)     Height 11/10/16 1925 4\' 11"  (1.499 m)     Head Circumference --      Peak Flow --      Pain Score 11/10/16 1924 10     Pain Loc --      Pain Edu? --      Excl. in [place]? --     Constitutional: Alert and oriented. Well appearing and in no acute distress. Eyes: Conjunctivae are normal. EOMI. PERRLA.  Head: Atraumatic. Nose: No congestion/rhinnorhea. Mouth/Throat: Mucous membranes are moist.   Neck: Normal range of motion.  Cspine with minimal tenderness; no stepoff or crepitus.  Cardiovascular:  Good peripheral circulation. Respiratory: Normal respiratory effort.  No retractions.  Gastrointestinal: Soft and nontender. No distention.  Genitourinary: No CVA tenderness. Musculoskeletal: No lower extremity edema.  Extremities warm and well perfused. Mild thoracic and moderate lumbar midline spinal tenderness.  No stepoff or crepitus. FROM at all joints, no deformity, pelvis nontender.   Neurologic:  Normal speech and language. Bilat LE weakness which pt states is chronic and unchanged.  Motor intact bilat upper extremities.  Sensation intact in all extremities.   Skin:  Skin is warm and dry. No rash noted. Psychiatric: Mood and affect are normal. Speech and behavior are normal.  ____________________________________________   LABS (all labs ordered are listed, but only abnormal results are displayed)  Labs Reviewed  URINALYSIS, COMPLETE (UACMP) WITH MICROSCOPIC - Abnormal; Notable for the following:       Result Value   Color, Urine COLORLESS (*)    APPearance CLEAR (*)    Specific Gravity, Urine 1.002 (*)    Leukocytes, UA TRACE (*)    Bacteria, UA RARE (*)    All other components within normal limits    ____________________________________________  EKG   ____________________________________________  RADIOLOGY  CT of C, T and L-spine with no fracture or other acute findings.  ____________________________________________   PROCEDURES  Procedure(s) performed: No    Critical Care performed: No ____________________________________________   INITIAL IMPRESSION / ASSESSMENT AND PLAN /  ED COURSE  Pertinent labs & imaging results that were available during my care of the patient were reviewed by me and considered in my medical decision making (see chart for details).  56 year old female with extensive past medical history as noted presents with lower back pain after mechanical fall earlier today. Patient had no near-syncope or dizziness leading into the fall. Fall likely precipitated by pain from subacute knee injury. Only other significant symptom on review of systems is suprapubic pain.  Exam is as described. Patient has chronic lower extremity weakness but reports no acute change in this. her exam finding is diffuse midline spinal tenderness but primarily in the lumbar region. Plan for CT of the whole spine to rule out acute fracture, and UA to rule out UTI.     ----------------------------------------- 10:20 PM on 11/10/2016 -----------------------------------------   CTs with no acute findings.  UA is negative. His daughter also pointed out to me a small lesion on her lower back. It appears to be a scab, approximately 6 mm in diameter, with mild surrounding erythema but no purulence, fluctuance, warmth, or induration. No indication for antibiotics. I instructed patient and daughter to keep an eye on it,  return to the ER or go to her primary care doctor if it worsens, and follow-up with a dermatologist within the next few weeks if it does not change.  Patient feels well to go home.  Return precautions given.  ____________________________________________   FINAL CLINICAL  IMPRESSION(S) / ED DIAGNOSES  Final diagnoses:  Injury of back, initial encounter      NEW MEDICATIONS STARTED DURING THIS VISIT:  New Prescriptions   No medications on file     Note:  This document was prepared using Dragon voice recognition software and may include unintentional dictation errors.    Arta Silence, MD 11/10/16 2222

## 2017-03-05 ENCOUNTER — Other Ambulatory Visit: Payer: Self-pay

## 2017-03-05 ENCOUNTER — Emergency Department: Payer: Medicare Other

## 2017-03-05 ENCOUNTER — Inpatient Hospital Stay
Admission: EM | Admit: 2017-03-05 | Discharge: 2017-03-07 | DRG: 378 | Disposition: A | Discharge status: Nursing Home (Includes ICF) | Payer: Medicare Other | Attending: Internal Medicine | Admitting: Internal Medicine

## 2017-03-05 ENCOUNTER — Encounter: Payer: Self-pay | Admitting: Emergency Medicine

## 2017-03-05 DIAGNOSIS — Z794 Long term (current) use of insulin: Secondary | ICD-10-CM

## 2017-03-05 DIAGNOSIS — Z7982 Long term (current) use of aspirin: Secondary | ICD-10-CM

## 2017-03-05 DIAGNOSIS — R195 Other fecal abnormalities: Secondary | ICD-10-CM

## 2017-03-05 DIAGNOSIS — Z8673 Personal history of transient ischemic attack (TIA), and cerebral infarction without residual deficits: Secondary | ICD-10-CM

## 2017-03-05 DIAGNOSIS — K921 Melena: Principal | ICD-10-CM | POA: Diagnosis present

## 2017-03-05 DIAGNOSIS — E876 Hypokalemia: Secondary | ICD-10-CM | POA: Diagnosis present

## 2017-03-05 DIAGNOSIS — Z91030 Bee allergy status: Secondary | ICD-10-CM

## 2017-03-05 DIAGNOSIS — E785 Hyperlipidemia, unspecified: Secondary | ICD-10-CM | POA: Diagnosis present

## 2017-03-05 DIAGNOSIS — F17210 Nicotine dependence, cigarettes, uncomplicated: Secondary | ICD-10-CM | POA: Diagnosis present

## 2017-03-05 DIAGNOSIS — D62 Acute posthemorrhagic anemia: Secondary | ICD-10-CM | POA: Diagnosis present

## 2017-03-05 DIAGNOSIS — Z23 Encounter for immunization: Secondary | ICD-10-CM

## 2017-03-05 DIAGNOSIS — Z888 Allergy status to other drugs, medicaments and biological substances status: Secondary | ICD-10-CM

## 2017-03-05 DIAGNOSIS — D649 Anemia, unspecified: Secondary | ICD-10-CM | POA: Diagnosis not present

## 2017-03-05 DIAGNOSIS — Z22322 Carrier or suspected carrier of Methicillin resistant Staphylococcus aureus: Secondary | ICD-10-CM

## 2017-03-05 DIAGNOSIS — I252 Old myocardial infarction: Secondary | ICD-10-CM

## 2017-03-05 DIAGNOSIS — Z885 Allergy status to narcotic agent status: Secondary | ICD-10-CM

## 2017-03-05 DIAGNOSIS — R51 Headache: Secondary | ICD-10-CM | POA: Diagnosis present

## 2017-03-05 DIAGNOSIS — R109 Unspecified abdominal pain: Secondary | ICD-10-CM

## 2017-03-05 DIAGNOSIS — Z6841 Body Mass Index (BMI) 40.0 and over, adult: Secondary | ICD-10-CM

## 2017-03-05 DIAGNOSIS — E1140 Type 2 diabetes mellitus with diabetic neuropathy, unspecified: Secondary | ICD-10-CM | POA: Diagnosis present

## 2017-03-05 DIAGNOSIS — K922 Gastrointestinal hemorrhage, unspecified: Secondary | ICD-10-CM | POA: Diagnosis present

## 2017-03-05 DIAGNOSIS — Z9049 Acquired absence of other specified parts of digestive tract: Secondary | ICD-10-CM

## 2017-03-05 DIAGNOSIS — Z86010 Personal history of colonic polyps: Secondary | ICD-10-CM

## 2017-03-05 DIAGNOSIS — F259 Schizoaffective disorder, unspecified: Secondary | ICD-10-CM | POA: Diagnosis present

## 2017-03-05 DIAGNOSIS — Z91013 Allergy to seafood: Secondary | ICD-10-CM

## 2017-03-05 DIAGNOSIS — I10 Essential (primary) hypertension: Secondary | ICD-10-CM | POA: Diagnosis present

## 2017-03-05 DIAGNOSIS — G4730 Sleep apnea, unspecified: Secondary | ICD-10-CM | POA: Diagnosis present

## 2017-03-05 DIAGNOSIS — R519 Headache, unspecified: Secondary | ICD-10-CM

## 2017-03-05 DIAGNOSIS — K219 Gastro-esophageal reflux disease without esophagitis: Secondary | ICD-10-CM | POA: Diagnosis present

## 2017-03-05 DIAGNOSIS — J449 Chronic obstructive pulmonary disease, unspecified: Secondary | ICD-10-CM | POA: Diagnosis present

## 2017-03-05 DIAGNOSIS — Z8249 Family history of ischemic heart disease and other diseases of the circulatory system: Secondary | ICD-10-CM

## 2017-03-05 DIAGNOSIS — Z79899 Other long term (current) drug therapy: Secondary | ICD-10-CM

## 2017-03-05 LAB — COMPREHENSIVE METABOLIC PANEL
ALT: 9 U/L — ABNORMAL LOW (ref 14–54)
AST: 17 U/L (ref 15–41)
Albumin: 3.7 g/dL (ref 3.5–5.0)
Alkaline Phosphatase: 135 U/L — ABNORMAL HIGH (ref 38–126)
Anion gap: 13 (ref 5–15)
BUN: 13 mg/dL (ref 6–20)
CO2: 26 mmol/L (ref 22–32)
Calcium: 8.7 mg/dL — ABNORMAL LOW (ref 8.9–10.3)
Chloride: 98 mmol/L — ABNORMAL LOW (ref 101–111)
Creatinine, Ser: 0.97 mg/dL (ref 0.44–1.00)
GFR calc Af Amer: >60 mL/min (ref >60)
GFR calc non Af Amer: >60 mL/min (ref >60)
Glucose, Bld: 176 mg/dL — ABNORMAL HIGH (ref 65–99)
Potassium: 3.4 mmol/L — ABNORMAL LOW (ref 3.5–5.1)
Sodium: 137 mmol/L (ref 135–145)
Total Bilirubin: 0.7 mg/dL (ref 0.3–1.2)
Total Protein: 6.9 g/dL (ref 6.5–8.1)

## 2017-03-05 LAB — CBC WITH DIFFERENTIAL/PLATELET
Basophils Absolute: 0.1 10*3/uL (ref 0–0.1)
Basophils Relative: 1 %
Eosinophils Absolute: 0.1 10*3/uL (ref 0–0.7)
Eosinophils Relative: 2 %
HCT: 20.3 % — ABNORMAL LOW (ref 35.0–47.0)
Hemoglobin: 5.5 g/dL — ABNORMAL LOW (ref 12.0–16.0)
Lymphocytes Relative: 14 %
Lymphs Abs: 1.1 10*3/uL (ref 1.0–3.6)
MCH: 15.2 pg — ABNORMAL LOW (ref 26.0–34.0)
MCHC: 27.2 g/dL — ABNORMAL LOW (ref 32.0–36.0)
MCV: 55.7 fL — ABNORMAL LOW (ref 80.0–100.0)
Monocytes Absolute: 0.6 10*3/uL (ref 0.2–0.9)
Monocytes Relative: 8 %
Neutro Abs: 6 10*3/uL (ref 1.4–6.5)
Neutrophils Relative %: 75 %
Platelets: 180 10*3/uL (ref 150–440)
RBC: 3.64 MIL/uL — ABNORMAL LOW (ref 3.80–5.20)
RDW: 21.1 % — ABNORMAL HIGH (ref 11.5–14.5)
WBC: 7.9 10*3/uL (ref 3.6–11.0)

## 2017-03-05 LAB — URINALYSIS, COMPLETE (UACMP) WITH MICROSCOPIC
Bilirubin Urine: NEGATIVE
Glucose, UA: >=500 mg/dL — AB
Hgb urine dipstick: NEGATIVE
Ketones, ur: NEGATIVE mg/dL
Nitrite: NEGATIVE
Protein, ur: NEGATIVE mg/dL
Specific Gravity, Urine: 1 — ABNORMAL LOW (ref 1.005–1.030)
pH: 6 (ref 5.0–8.0)

## 2017-03-05 LAB — LIPASE, BLOOD: Lipase: 29 U/L (ref 11–51)

## 2017-03-05 MED ORDER — IOPAMIDOL (ISOVUE-300) INJECTION 61%
30.0000 mL | Freq: Once | INTRAVENOUS | Status: AC
  Administered 2017-03-05: 30 mL via ORAL

## 2017-03-05 MED ORDER — IOPAMIDOL (ISOVUE-300) INJECTION 61%
125.0000 mL | Freq: Once | INTRAVENOUS | Status: AC | PRN
  Administered 2017-03-05: 125 mL via INTRAVENOUS

## 2017-03-05 MED ORDER — SODIUM CHLORIDE 0.9 % IV SOLN
10.0000 mL/h | Freq: Once | INTRAVENOUS | Status: AC
  Administered 2017-03-06: 10 mL/h via INTRAVENOUS

## 2017-03-05 MED ORDER — METOCLOPRAMIDE HCL 5 MG/ML IJ SOLN
10.0000 mg | Freq: Once | INTRAMUSCULAR | Status: AC
  Administered 2017-03-05: 10 mg via INTRAVENOUS
  Filled 2017-03-05: qty 2

## 2017-03-05 NOTE — ED Triage Notes (Signed)
Pt to ED via EMS from [facility] c/o headache that started approx 1hr PTA and right abd pain intermittently for several months with some nausea.  Photophobia with headache.  Hx of HTN, DBM.  Patient given 4mg  zofran and tylenol at facility.  EMS vitals 152/66 BP, 172 CBG.

## 2017-03-05 NOTE — ED Provider Notes (Addendum)
[facility] Emergency Department Provider Note   ____________________________________________   First MD Initiated Contact with Patient 03/05/17 2050     (approximate)  I have reviewed the triage vital signs and the nursing notes.   HISTORY  Chief Complaint Headache and Abdominal Pain    HPI Cristina [name] is a 57 y.o. female Patient reports onset of a headache frontal radiating over the top of her head to the back throbbing in nature about an hour prior to arrival. She reports it is not any worse than her usual headaches just different. She also reports intermittent pain in the right side of the abdomen this been coming and going for a month or 2. This just an achy pain doesn't seem to be brought on by anything or made worse by anything or made better by anything. She is  unclear about how long it lasts.she comes from assisted living. She reported that a new patient came in today who has dementia and does not want to be there and is racing fast. This has a number of the other patients upset.   Past Medical History:  Diagnosis Date  . Abdominal hernia   . Arthritis   . Asthma   . Bladder dysfunction   . Carpal tunnel syndrome   . CHF (congestive heart failure) ([place])   . COPD (chronic obstructive pulmonary disease) ([place] )   . DDD (degenerative disc disease), lumbar   . Diabetes mellitus without complication ([place])   . Dyspnea   . Edema   . Fatty tumor   . GERD (gastroesophageal reflux disease)   . H/O colonoscopy   . H/O tubal ligation    hx btl  . History of cholecystectomy    hx of cholecystectomy  . HLD (hyperlipidemia)   . Hypertension   . Muscle cramps   . Neuropathy   . Obesity   . Recurrent boils   . Schizoaffective disorder ([place])   . Sleep apnea   . Stroke ([place])   . UTI (lower urinary tract infection)   . Vaginitis     Patient Active Problem List   Diagnosis Date Noted  . Sepsis ([place]) 07/15/2016  . Septic shock ([place]) 10/28/2015    . COPD (chronic obstructive pulmonary disease) ([place]) 07/31/2015  . Aspirin overdose 07/30/2015  . Schizoaffective disorder, bipolar type ([place]) 12/05/2014  . HTN (hypertension) 12/05/2014  . Sleep apnea 12/05/2014  . Diabetes ([place]) 12/05/2014  . Incomplete bladder emptying 12/04/2014  . Recurrent UTI 12/04/2014  . Polyneuropathy 03/20/2009  . Arthritis, degenerative 05/23/2007  . HLD (hyperlipidemia) 05/23/2007    Past Surgical History:  Procedure Laterality Date  . CARDIAC CATHETERIZATION Left 09/24/2014   Procedure: Left Heart Cath and Coronary Angiography;  Surgeon: Dionisio David, MD;  Location: [facility];  Service: Cardiovascular;  Laterality: Left;  . CHOLECYSTECTOMY    . COLONOSCOPY WITH PROPOFOL N/A 08/12/2015   Procedure: COLONOSCOPY WITH PROPOFOL;  Surgeon: Lollie Sails, MD;  Location: [facility] ENDOSCOPY;  Service: Endoscopy;  Laterality: N/A;  . ESOPHAGOGASTRODUODENOSCOPY (EGD) WITH PROPOFOL N/A 08/12/2015   Procedure: ESOPHAGOGASTRODUODENOSCOPY (EGD) WITH PROPOFOL;  Surgeon: Lollie Sails, MD;  Location: [facility] ENDOSCOPY;  Service: Endoscopy;  Laterality: N/A;  . tubal ligation      Prior to Admission medications   Medication Sig Start Date End Date Taking? Authorizing Provider  acetaminophen (TYLENOL) 500 MG tablet Take 500 mg by mouth every 8 (eight) hours as needed for mild pain or moderate pain.  [provider]  albuterol (PROVENTIL HFA;VENTOLIN HFA) 108 (90 BASE) MCG/ACT inhaler Inhale 2 puffs into the lungs every 6 (six) hours as needed for wheezing or shortness of breath.    [provider]  aspirin EC 81 MG tablet Take 81 mg by mouth daily.    [provider]  atorvastatin (LIPITOR) 20 MG tablet Take 20 mg by mouth at bedtime.    [provider]  cetirizine (ZYRTEC) 10 MG tablet Take 10 mg by mouth daily.     [provider]  clonazePAM (KLONOPIN) 0.5 MG tablet Take 1 tablet (0.5 mg total) by mouth 2 (two)  times daily. 07/18/16   Nicholes Mango, MD  diphenoxylate-atropine (LOMOTIL) 2.5-0.025 MG tablet Take 1 tablet by mouth daily as needed for diarrhea or loose stools.    [provider]  Fluticasone-Salmeterol (ADVAIR) 250-50 MCG/DOSE AEPB Inhale 1 puff into the lungs 2 (two) times daily.    [provider]  furosemide (LASIX) 20 MG tablet Take 1 tablet (20 mg total) by mouth 2 (two) times daily. 07/18/16   Nicholes Mango, MD  haloperidol (HALDOL) 5 MG tablet Take 1 tablet (5 mg total) by mouth at bedtime. 10/31/15   Bettey Costa, MD  haloperidol decanoate (HALDOL DECANOATE) 100 MG/ML injection Inject 1 mL (100 mg total) into the muscle every 30 (thirty) days. 08/02/15   Hildred Priest, MD  insulin aspart (NOVOLOG) 100 UNIT/ML injection Inject 0-9 Units into the skin 3 (three) times daily with meals. CBG 70 - 120: 0 units CBG 121 - 150: 0 units CBG 151 - 200: 0 units CBG 201 - 250: 2 units CBG 251 - 300: 3 units CBG 301 - 350: 4 units CBG 351 - 400: 5 units 07/18/16   Gouru, Aruna, MD  insulin detemir (LEVEMIR) 100 UNIT/ML injection Inject 0.35 mLs (35 Units total) into the skin 2 (two) times daily. Pt uses 50 units in the morning and 52 units at bedtime. Patient taking differently: Inject 35 Units into the skin 2 (two) times daily.  07/18/16   Nicholes Mango, MD  loperamide (IMODIUM A-D) 2 MG tablet Take 2 mg by mouth 4 (four) times daily as needed for diarrhea or loose stools.    [provider]  meloxicam (MOBIC) 7.5 MG tablet Take 1 tablet (7.5 mg total) by mouth 2 (two) times daily. 08/02/15   Hildred Priest, MD  menthol-cetylpyridinium (CEPACOL) 3 MG lozenge Take 1 lozenge by mouth as needed for sore throat.    [provider]  metFORMIN (GLUCOPHAGE-XR) 500 MG 24 hr tablet Take 500 mg by mouth 2 (two) times daily. 05/23/15   [provider]  metoprolol tartrate (LOPRESSOR) 25 MG tablet Take 0.5 tablets (12.5 mg total) by mouth 2 (two) times  daily. Patient taking differently: Take 50 mg by mouth 2 (two) times daily.  07/18/16   Nicholes Mango, MD  mirtazapine (REMERON) 45 MG tablet Take 1 tablet (45 mg total) by mouth at bedtime. 10/31/15   Bettey Costa, MD  nitroGLYCERIN (NITROSTAT) 0.3 MG SL tablet Place 0.3 mg under the tongue every 5 (five) minutes as needed for chest pain.    [provider]  ondansetron (ZOFRAN) 4 MG tablet Take 4 mg by mouth every 6 (six) hours as needed for nausea or vomiting.    [provider]  pantoprazole (PROTONIX) 40 MG tablet Take 40 mg by mouth 2 (two) times daily.    [provider]  potassium chloride 20 MEQ TBCR Take 10  mEq by mouth daily. 07/18/16   Gouru, Illene Silver, MD  pregabalin (LYRICA) 100 MG capsule Take 100 mg by mouth 3 (three) times daily.    [provider]  senna-docusate (SENOKOT-S) 8.6-50 MG tablet Take 1 tablet by mouth at bedtime as needed for mild constipation. 07/18/16   Gouru, Illene Silver, MD  umeclidinium-vilanterol (ANORO ELLIPTA) 62.5-25 MCG/INH AEPB Inhale 1 puff into the lungs daily.    [provider]  venlafaxine XR (EFFEXOR-XR) 75 MG 24 hr capsule Take 225 mg by mouth daily with breakfast.     [provider]    Allergies Shellfish-derived products; Codeine; Thorazine [chlorpromazine]; and Tylenol with codeine #3 [acetaminophen-codeine]  Family History  Problem Relation Age of Onset  . CAD Mother     Social History Social History   Tobacco Use  . Smoking status: Current Some Day Smoker    Packs/day: 0.25    Types: Cigarettes  . Smokeless tobacco: Never Used  Substance Use Topics  . Alcohol use: No    Alcohol/week: 0.0 oz  . Drug use: No    Review of Systems  Constitutional: No fever/chills Eyes: No visual changes. ENT: No sore throat. Cardiovascular: Denies chest pain. Respiratory: Denies shortness of breath. Gastrointestinal: see history of present illness Genitourinary: Negative for dysuria. Musculoskeletal:  Negative for back pain. Skin: Negative for rash. Neurological: Negative for headaches, focal weakness   ____________________________________________   PHYSICAL EXAM:  VITAL SIGNS: ED Triage Vitals [03/05/17 2055]  Enc Vitals Group     BP (!) 123/109     Pulse Rate 85     Resp 10     Temp 98.3 F (36.8 C)     Temp Source Oral     SpO2 99 %     Weight 240 lb (108.9 kg)     Height 4\' 11"  (1.499 m)     Head Circumference      Peak Flow      Pain Score 10     Pain Loc      Pain Edu?      Excl. in [place]?     Constitutional: Alert and oriented. Well appearing and in no acute distress. Eyes: Conjunctivae are normal. PERRL. EOMI.fundi look normal Head: Atraumatic. Nose: No congestion/rhinnorhea. Mouth/Throat: Mucous membranes are moist.  Oropharynx non-erythematous. Neck: No stridor.   Cardiovascular: Normal rate, regular rhythm. Grossly normal heart sounds.  Good peripheral circulation. Respiratory: Normal respiratory effort.  No retractions. Lungs CTAB. Gastrointestinal: Soft some tenderness to palpation on the right side seems to be worse in the upper part of the abdomen and the lower. No distention. No abdominal bruits. No CVA tenderness. Musculoskeletal: No lower extremity tenderness nor edema.  No joint effusions. Neurologic:  Normal speech and language. No new gross focal neurologic deficits are appreciated.  Skin:  Skin is warm, dry and intact. No rash noted. Psychiatric: Mood and affect are normal. Speech and behavior are normal. rectal: Trace Hemoccult positive ____________________________________________   LABS (all labs ordered are listed, but only abnormal results are displayed)  Labs Reviewed  COMPREHENSIVE METABOLIC PANEL - Abnormal; Notable for the following components:      Result Value   Potassium 3.4 (*)    Chloride 98 (*)    Glucose, Bld 176 (*)    Calcium 8.7 (*)    ALT 9 (*)    Alkaline Phosphatase 135 (*)    All other components within normal  limits  CBC WITH DIFFERENTIAL/PLATELET - Abnormal; Notable for the following components:  RBC 3.64 (*)    Hemoglobin 5.5 (*)    HCT 20.3 (*)    MCV 55.7 (*)    MCH 15.2 (*)    MCHC 27.2 (*)    RDW 21.1 (*)    All other components within normal limits  URINALYSIS, COMPLETE (UACMP) WITH MICROSCOPIC - Abnormal; Notable for the following components:   Color, Urine STRAW (*)    APPearance CLEAR (*)    Specific Gravity, Urine 1.000 (*)    Glucose, UA >=500 (*)    Leukocytes, UA TRACE (*)    Bacteria, UA MANY (*)    Squamous Epithelial / LPF 0-5 (*)    All other components within normal limits  LIPASE, BLOOD   ____________________________________________  EKG   ____________________________________________  RADIOLOGY  head and belly CT showed no acute disease  ____________________________________________   PROCEDURES  Procedure(s) performed:   Procedures  Critical Care performed:  ____________________________________________   INITIAL IMPRESSION / ASSESSMENT AND PLAN / ED COURSE  on questioning patient admits to some lightheadedness recently and wooziness. Tylenol does not help her headache so we gave her some Reglan. CT of the abdomen shows no reason for the abdominal pain. Seems to be more right-sided than anything. She is very anemic with a hemoglobin of 5.5 and complaints of lightheadedness and Hemoccult-positive stool think the best thing to do would be admit her consider transfusion GI evaluation before discharge      ____________________________________________   FINAL CLINICAL IMPRESSION(S) / ED DIAGNOSES  Final diagnoses:  Symptomatic anemia  Nonintractable episodic headache, unspecified headache type  Abdominal pain, unspecified abdominal location     ED Discharge Orders    None       Note:  This document was prepared using Dragon voice recognition software and may include unintentional dictation errors.    Nena Polio, MD 03/05/17  [id number]    Nena Polio, MD 03/05/17 2352

## 2017-03-05 NOTE — ED Notes (Signed)
Pt is awake alert and oriented reports has a headache "pain comes and goes" reports took Tylenol with no relief. RN informed  Pt will make MD aware. Pt denies any other needs at present.

## 2017-03-05 NOTE — ED Notes (Signed)
Patient transported to CT 

## 2017-03-05 NOTE — ED Notes (Signed)
Dr Cinda Quest at bedside Hemocult done, positive for hemocult. Pt tolerated well, RN present at bedside

## 2017-03-05 NOTE — ED Provider Notes (Signed)
discussed risks and benefits of transfusion with patient and obtained her consent   Cristina Polio, MD 03/05/17 [phone]

## 2017-03-06 ENCOUNTER — Encounter
Admission: EM | Disposition: A | Discharge status: Nursing Home (Includes ICF) | Payer: Self-pay | Source: Home / Self Care | Attending: Internal Medicine

## 2017-03-06 ENCOUNTER — Inpatient Hospital Stay: Payer: Medicare Other | Admitting: Anesthesiology

## 2017-03-06 ENCOUNTER — Encounter: Payer: Self-pay | Admitting: Internal Medicine

## 2017-03-06 DIAGNOSIS — R51 Headache: Secondary | ICD-10-CM | POA: Diagnosis present

## 2017-03-06 DIAGNOSIS — E876 Hypokalemia: Secondary | ICD-10-CM | POA: Diagnosis present

## 2017-03-06 DIAGNOSIS — R109 Unspecified abdominal pain: Secondary | ICD-10-CM

## 2017-03-06 DIAGNOSIS — D649 Anemia, unspecified: Secondary | ICD-10-CM | POA: Diagnosis present

## 2017-03-06 DIAGNOSIS — F17210 Nicotine dependence, cigarettes, uncomplicated: Secondary | ICD-10-CM | POA: Diagnosis present

## 2017-03-06 DIAGNOSIS — R195 Other fecal abnormalities: Secondary | ICD-10-CM

## 2017-03-06 DIAGNOSIS — E785 Hyperlipidemia, unspecified: Secondary | ICD-10-CM | POA: Diagnosis present

## 2017-03-06 DIAGNOSIS — Z888 Allergy status to other drugs, medicaments and biological substances status: Secondary | ICD-10-CM | POA: Diagnosis not present

## 2017-03-06 DIAGNOSIS — Z794 Long term (current) use of insulin: Secondary | ICD-10-CM | POA: Diagnosis not present

## 2017-03-06 DIAGNOSIS — I252 Old myocardial infarction: Secondary | ICD-10-CM | POA: Diagnosis not present

## 2017-03-06 DIAGNOSIS — D62 Acute posthemorrhagic anemia: Secondary | ICD-10-CM

## 2017-03-06 DIAGNOSIS — I10 Essential (primary) hypertension: Secondary | ICD-10-CM | POA: Diagnosis present

## 2017-03-06 DIAGNOSIS — Z79899 Other long term (current) drug therapy: Secondary | ICD-10-CM | POA: Diagnosis not present

## 2017-03-06 DIAGNOSIS — Z8249 Family history of ischemic heart disease and other diseases of the circulatory system: Secondary | ICD-10-CM | POA: Diagnosis not present

## 2017-03-06 DIAGNOSIS — F259 Schizoaffective disorder, unspecified: Secondary | ICD-10-CM | POA: Diagnosis present

## 2017-03-06 DIAGNOSIS — G4730 Sleep apnea, unspecified: Secondary | ICD-10-CM | POA: Diagnosis present

## 2017-03-06 DIAGNOSIS — Z23 Encounter for immunization: Secondary | ICD-10-CM | POA: Diagnosis present

## 2017-03-06 DIAGNOSIS — K921 Melena: Principal | ICD-10-CM

## 2017-03-06 DIAGNOSIS — Z22322 Carrier or suspected carrier of Methicillin resistant Staphylococcus aureus: Secondary | ICD-10-CM | POA: Diagnosis not present

## 2017-03-06 DIAGNOSIS — J449 Chronic obstructive pulmonary disease, unspecified: Secondary | ICD-10-CM | POA: Diagnosis present

## 2017-03-06 DIAGNOSIS — Z6841 Body Mass Index (BMI) 40.0 and over, adult: Secondary | ICD-10-CM | POA: Diagnosis not present

## 2017-03-06 DIAGNOSIS — K922 Gastrointestinal hemorrhage, unspecified: Secondary | ICD-10-CM | POA: Diagnosis present

## 2017-03-06 DIAGNOSIS — Z9049 Acquired absence of other specified parts of digestive tract: Secondary | ICD-10-CM | POA: Diagnosis not present

## 2017-03-06 DIAGNOSIS — Z8673 Personal history of transient ischemic attack (TIA), and cerebral infarction without residual deficits: Secondary | ICD-10-CM | POA: Diagnosis not present

## 2017-03-06 DIAGNOSIS — K219 Gastro-esophageal reflux disease without esophagitis: Secondary | ICD-10-CM | POA: Diagnosis present

## 2017-03-06 DIAGNOSIS — E1140 Type 2 diabetes mellitus with diabetic neuropathy, unspecified: Secondary | ICD-10-CM | POA: Diagnosis present

## 2017-03-06 DIAGNOSIS — Z7982 Long term (current) use of aspirin: Secondary | ICD-10-CM | POA: Diagnosis not present

## 2017-03-06 HISTORY — PX: ESOPHAGOGASTRODUODENOSCOPY (EGD) WITH PROPOFOL: SHX5813

## 2017-03-06 LAB — BASIC METABOLIC PANEL
Anion gap: 10 (ref 5–15)
BUN: 10 mg/dL (ref 6–20)
CO2: 25 mmol/L (ref 22–32)
Calcium: 9 mg/dL (ref 8.9–10.3)
Chloride: 105 mmol/L (ref 101–111)
Creatinine, Ser: 0.77 mg/dL (ref 0.44–1.00)
GFR calc Af Amer: >60 mL/min (ref >60)
GFR calc non Af Amer: >60 mL/min (ref >60)
Glucose, Bld: 175 mg/dL — ABNORMAL HIGH (ref 65–99)
Potassium: 4.3 mmol/L (ref 3.5–5.1)
Sodium: 140 mmol/L (ref 135–145)

## 2017-03-06 LAB — CBC
HCT: 26.2 % — ABNORMAL LOW (ref 35.0–47.0)
Hemoglobin: 7.6 g/dL — ABNORMAL LOW (ref 12.0–16.0)
MCH: 18.1 pg — ABNORMAL LOW (ref 26.0–34.0)
MCHC: 29.1 g/dL — ABNORMAL LOW (ref 32.0–36.0)
MCV: 62.2 fL — ABNORMAL LOW (ref 80.0–100.0)
Platelets: 155 10*3/uL (ref 150–440)
RBC: 4.21 MIL/uL (ref 3.80–5.20)
RDW: 27.8 % — ABNORMAL HIGH (ref 11.5–14.5)
WBC: 7.8 10*3/uL (ref 3.6–11.0)

## 2017-03-06 LAB — PREPARE RBC (CROSSMATCH)

## 2017-03-06 LAB — HEMOGLOBIN: Hemoglobin: 7.5 g/dL — ABNORMAL LOW (ref 12.0–15.0)

## 2017-03-06 LAB — MRSA PCR SCREENING: MRSA by PCR: POSITIVE — AB

## 2017-03-06 SURGERY — ESOPHAGOGASTRODUODENOSCOPY (EGD) WITH PROPOFOL
Anesthesia: General

## 2017-03-06 MED ORDER — ACETAMINOPHEN 325 MG PO TABS: 650.0000 mg | ORAL_TABLET | Freq: Four times a day (QID) | ORAL | Status: DC | PRN

## 2017-03-06 MED ORDER — SODIUM CHLORIDE 0.9% FLUSH
3.0000 mL | Freq: Two times a day (BID) | INTRAVENOUS | Status: DC
  Administered 2017-03-06 – 2017-03-07 (×4): 3 mL via INTRAVENOUS

## 2017-03-06 MED ORDER — LIDOCAINE HCL (CARDIAC) 20 MG/ML IV SOLN
INTRAVENOUS | Status: DC | PRN
  Administered 2017-03-06: 80 mg via INTRAVENOUS

## 2017-03-06 MED ORDER — PANTOPRAZOLE SODIUM 40 MG IV SOLR
40.0000 mg | Freq: Two times a day (BID) | INTRAVENOUS | Status: DC
  Administered 2017-03-06 – 2017-03-07 (×4): 40 mg via INTRAVENOUS
  Filled 2017-03-06 (×4): qty 40

## 2017-03-06 MED ORDER — CHLORHEXIDINE GLUCONATE CLOTH 2 % EX PADS: 6.0000 | MEDICATED_PAD | Freq: Every day | CUTANEOUS | Status: DC

## 2017-03-06 MED ORDER — SODIUM CHLORIDE 0.9 % IV SOLN
250.0000 mL | INTRAVENOUS | Status: DC | PRN
  Administered 2017-03-06: 12:00:00 via INTRAVENOUS

## 2017-03-06 MED ORDER — SODIUM CHLORIDE 0.9% FLUSH: 3.0000 mL | INTRAVENOUS | Status: DC | PRN

## 2017-03-06 MED ORDER — MOMETASONE FURO-FORMOTEROL FUM 200-5 MCG/ACT IN AERO
2.0000 | INHALATION_SPRAY | Freq: Two times a day (BID) | RESPIRATORY_TRACT | Status: DC
Start: 2017-03-06 — End: 2017-03-07
  Administered 2017-03-06 – 2017-03-07 (×3): 2 via RESPIRATORY_TRACT
  Filled 2017-03-06: qty 8.8

## 2017-03-06 MED ORDER — OXYCODONE-ACETAMINOPHEN 5-325 MG PO TABS
1.0000 | ORAL_TABLET | Freq: Once | ORAL | Status: AC
  Administered 2017-03-06: 1 via ORAL
  Filled 2017-03-06: qty 1

## 2017-03-06 MED ORDER — LIDOCAINE HCL (PF) 2 % IJ SOLN
INTRAMUSCULAR | Status: AC
Start: 2017-03-06 — End: 2017-03-06
  Filled 2017-03-06: qty 10

## 2017-03-06 MED ORDER — PNEUMOCOCCAL VAC POLYVALENT 25 MCG/0.5ML IJ INJ
0.5000 mL | INJECTION | INTRAMUSCULAR | Status: AC
  Administered 2017-03-07: 0.5 mL via INTRAMUSCULAR
  Filled 2017-03-06: qty 0.5

## 2017-03-06 MED ORDER — ACETAMINOPHEN 650 MG RE SUPP: 650.0000 mg | Freq: Four times a day (QID) | RECTAL | Status: DC | PRN

## 2017-03-06 MED ORDER — POTASSIUM CHLORIDE CRYS ER 20 MEQ PO TBCR
20.0000 meq | EXTENDED_RELEASE_TABLET | Freq: Once | ORAL | Status: AC
  Administered 2017-03-06: 20 meq via ORAL
  Filled 2017-03-06: qty 1

## 2017-03-06 MED ORDER — PROPOFOL 10 MG/ML IV BOLUS
INTRAVENOUS | Status: DC | PRN
  Administered 2017-03-06: 40 mg via INTRAVENOUS

## 2017-03-06 MED ORDER — PROPOFOL 500 MG/50ML IV EMUL
INTRAVENOUS | Status: DC | PRN
  Administered 2017-03-06: 100 ug/kg/min via INTRAVENOUS

## 2017-03-06 MED ORDER — ALBUTEROL SULFATE (2.5 MG/3ML) 0.083% IN NEBU: 2.5000 mg | INHALATION_SOLUTION | Freq: Four times a day (QID) | RESPIRATORY_TRACT | Status: DC | PRN

## 2017-03-06 MED ORDER — PROPOFOL 10 MG/ML IV BOLUS
INTRAVENOUS | Status: AC
  Filled 2017-03-06: qty 20

## 2017-03-06 MED ORDER — ONDANSETRON HCL 4 MG PO TABS
4.0000 mg | ORAL_TABLET | Freq: Four times a day (QID) | ORAL | Status: DC | PRN
  Administered 2017-03-06: 4 mg via ORAL
  Filled 2017-03-06: qty 1

## 2017-03-06 MED ORDER — MIDAZOLAM HCL 2 MG/2ML IJ SOLN
INTRAMUSCULAR | Status: AC
  Filled 2017-03-06: qty 2

## 2017-03-06 MED ORDER — MUPIROCIN 2 % EX OINT
1.0000 "application " | TOPICAL_OINTMENT | Freq: Two times a day (BID) | CUTANEOUS | Status: DC
  Administered 2017-03-06 – 2017-03-07 (×3): 1 via NASAL
  Filled 2017-03-06: qty 22

## 2017-03-06 MED ORDER — ONDANSETRON HCL 4 MG/2ML IJ SOLN
4.0000 mg | Freq: Four times a day (QID) | INTRAMUSCULAR | Status: DC | PRN
  Administered 2017-03-07: 4 mg via INTRAVENOUS
  Filled 2017-03-06: qty 2

## 2017-03-06 NOTE — Anesthesia Postprocedure Evaluation (Signed)
Anesthesia Post Note  Patient: Cristina [name]  Procedure(s) Performed: ESOPHAGOGASTRODUODENOSCOPY (EGD) WITH PROPOFOL (N/A )  Patient location during evaluation: Endoscopy Anesthesia Type: General Level of consciousness: awake and alert Pain management: pain level controlled Vital Signs Assessment: post-procedure vital signs reviewed and stable Respiratory status: spontaneous breathing, nonlabored ventilation, respiratory function stable and patient connected to nasal cannula oxygen Cardiovascular status: blood pressure returned to baseline and stable Postop Assessment: no apparent nausea or vomiting Anesthetic complications: no     Last Vitals:  Vitals:   03/06/17 1259 03/06/17 1309  BP: (!) 101/54 (!) 110/55  Pulse:    Resp:    Temp: 36.6 C   SpO2:      Last Pain:  Vitals:   03/06/17 1309  TempSrc:   PainSc: 0-No pain                 Precious Haws Kamree Wiens

## 2017-03-06 NOTE — Consult Note (Signed)
Cristina Lame, MD [facility]  [address], [place], [place] [address] Phone: [phone] Fax : [phone]  Consultation  Referring Provider:     Dr. Lenna Sciara Primary Care Physician:  Patient, No Pcp Per Primary Gastroenterologist:  Dr. Gustavo Lah Reason for Consultation:     [place]  Date of Admission:  03/05/2017 Date of Consultation:  03/06/2017         HPI:   Cristina [name] is a 57 y.o. female who was admitted with abdominal pain and weakness with fatigue. The patient had stool samples checked in the ER which was noted to be heme positive. She states she's been feeling tired and weak the last few weeks. The patient denies any vomiting or passing of bright red blood. The patient received 2 units of blood in the emergency room. The patient also CT scan of the head and abdomen that did not show any abnormalities. On admission the patient had a hemoglobin of 8.4 that went down to 5.5 yesterday and now is 7.6. The patient is not a very good historian and is not able to tell me much about what recent events leading to her admission were. I'm now being asked to see the patient for profound anemia with heme-positive stools and a possible upper GI bleed. There is a report that the patient may be taking meloxicam at home.  Past Medical History:  Diagnosis Date  . Abdominal hernia   . Arthritis   . Asthma   . Bladder dysfunction   . Carpal tunnel syndrome   . CHF (congestive heart failure) ([place])   . COPD (chronic obstructive pulmonary disease) ([place])   . DDD (degenerative disc disease), lumbar   . Diabetes mellitus without complication ([place])   . Dyspnea   . Edema   . Fatty tumor   . GERD (gastroesophageal reflux disease)   . H/O colonoscopy   . H/O tubal ligation    hx btl  . History of cholecystectomy    hx of cholecystectomy  . HLD (hyperlipidemia)   . Hypertension   . Muscle cramps   . Neuropathy   . Obesity   . Recurrent boils   . Schizoaffective disorder ([place])   . Sleep  apnea   . Stroke ([place])   . UTI (lower urinary tract infection)   . Vaginitis     Past Surgical History:  Procedure Laterality Date  . CARDIAC CATHETERIZATION Left 09/24/2014   Procedure: Left Heart Cath and Coronary Angiography;  Surgeon: Dionisio David, MD;  Location: [facility];  Service: Cardiovascular;  Laterality: Left;  . CHOLECYSTECTOMY    . COLONOSCOPY WITH PROPOFOL N/A 08/12/2015   Procedure: COLONOSCOPY WITH PROPOFOL;  Surgeon: Lollie Sails, MD;  Location: [facility] ENDOSCOPY;  Service: Endoscopy;  Laterality: N/A;  . ESOPHAGOGASTRODUODENOSCOPY (EGD) WITH PROPOFOL N/A 08/12/2015   Procedure: ESOPHAGOGASTRODUODENOSCOPY (EGD) WITH PROPOFOL;  Surgeon: Lollie Sails, MD;  Location[facility] ENDOSCOPY;  Service: Endoscopy;  Laterality: N/A;  . tubal ligation      Prior to Admission medications   Medication Sig Start Date End Date Taking? Authorizing Provider  acetaminophen (TYLENOL) 500 MG tablet Take 500 mg by mouth every 8 (eight) hours as needed for mild pain or moderate pain.   Yes [provider]  albuterol (PROVENTIL HFA;VENTOLIN HFA) 108 (90 BASE) MCG/ACT inhaler Inhale 2 puffs into the lungs every 6 (six) hours as needed for wheezing or shortness of breath.   Yes [provider]  aspirin EC 81 MG  tablet Take 81 mg by mouth daily.   Yes [provider]  atorvastatin (LIPITOR) 20 MG tablet Take 20 mg by mouth at bedtime.   Yes [provider]  cetirizine (ZYRTEC) 10 MG tablet Take 10 mg by mouth daily.    Yes [provider]  clonazePAM (KLONOPIN) 0.5 MG tablet Take 1 tablet (0.5 mg total) by mouth 2 (two) times daily. 07/18/16  Yes Gouru, Illene Silver, MD  diphenoxylate-atropine (LOMOTIL) 2.5-0.025 MG tablet Take 1 tablet by mouth daily as needed for diarrhea or loose stools.   Yes [provider]  Fluticasone-Salmeterol (ADVAIR) 250-50 MCG/DOSE AEPB Inhale 1 puff into the lungs 2 (two) times daily.   Yes [provider]  furosemide (LASIX) 20 MG tablet Take 1 tablet (20 mg total) by mouth 2 (two) times daily. Patient taking differently: Take 20 mg by mouth 3 (three) times a week.  07/18/16  Yes Gouru, Illene Silver, MD  haloperidol (HALDOL) 5 MG tablet Take 1 tablet (5 mg total) by mouth at bedtime. 10/31/15  Yes Bettey Costa, MD  haloperidol decanoate (HALDOL DECANOATE) 100 MG/ML injection Inject 1 mL (100 mg total) into the muscle every 30 (thirty) days. 08/02/15  Yes Hildred Priest, MD  insulin aspart (NOVOLOG) 100 UNIT/ML injection Inject 0-9 Units into the skin 3 (three) times daily with meals. CBG 70 - 120: 0 units CBG 121 - 150: 0 units CBG 151 - 200: 0 units CBG 201 - 250: 2 units CBG 251 - 300: 3 units CBG 301 - 350: 4 units CBG 351 - 400: 5 units Patient taking differently: Inject 0-12 Units into the skin 3 (three) times daily with meals. 250-300 6 units 301-350 8 units 351-400 10 units Greater than 400 12 units 07/18/16  Yes Gouru, Aruna, MD  insulin detemir (LEVEMIR) 100 UNIT/ML injection Inject 0.35 mLs (35 Units total) into the skin 2 (two) times daily. Pt uses 50 units in the morning and 52 units at bedtime. Patient taking differently: Inject 40 Units into the skin 2 (two) times daily.  07/18/16  Yes Gouru, Illene Silver, MD  loperamide (IMODIUM A-D) 2 MG tablet Take 2 mg by mouth 4 (four) times daily as needed for diarrhea or loose stools.   Yes [provider]  meloxicam (MOBIC) 7.5 MG tablet Take 1 tablet (7.5 mg total) by mouth 2 (two) times daily. 08/02/15  Yes Hildred Priest, MD  menthol-cetylpyridinium (CEPACOL) 3 MG lozenge Take 1 lozenge by mouth as needed for sore throat.   Yes [provider]  metFORMIN (GLUCOPHAGE-XR) 500 MG 24 hr tablet Take 1,000 mg by mouth 2 (two) times daily.  05/23/15  Yes [provider]  metoprolol tartrate (LOPRESSOR) 25 MG tablet Take 0.5 tablets (12.5 mg total) by mouth 2 (two) times daily. Patient taking differently: Take 50 mg  by mouth 2 (two) times daily.  07/18/16  Yes Gouru, Illene Silver, MD  nitroGLYCERIN (NITROSTAT) 0.3 MG SL tablet Place 0.3 mg under the tongue every 5 (five) minutes as needed for chest pain.   Yes [provider]  ondansetron (ZOFRAN) 4 MG tablet Take 4 mg by mouth every 6 (six) hours as needed for nausea or vomiting.   Yes [provider]  pantoprazole (PROTONIX) 40 MG tablet Take 40 mg by mouth 2 (two) times daily.   Yes [provider]  potassium chloride 20 MEQ TBCR Take 10 mEq by mouth daily. 07/18/16  Yes Gouru, Illene Silver, MD  pregabalin (LYRICA) 100 MG capsule Take 100 mg by  mouth 3 (three) times daily.   Yes [provider]  senna-docusate (SENOKOT-S) 8.6-50 MG tablet Take 1 tablet by mouth at bedtime as needed for mild constipation. 07/18/16  Yes Gouru, Aruna, MD  umeclidinium-vilanterol (ANORO ELLIPTA) 62.5-25 MCG/INH AEPB Inhale 1 puff into the lungs daily.   Yes [provider]  venlafaxine XR (EFFEXOR-XR) 150 MG 24 hr capsule Take 225 mg by mouth daily with breakfast.   Yes [provider]  venlafaxine XR (EFFEXOR-XR) 75 MG 24 hr capsule Take 75 mg by mouth daily with breakfast.    Yes [provider]    Family History  Problem Relation Age of Onset  . CAD Mother   . Lung cancer Mother      Social History   Tobacco Use  . Smoking status: Current Some Day Smoker    Packs/day: 0.25    Types: Cigarettes  . Smokeless tobacco: Never Used  Substance Use Topics  . Alcohol use: No    Alcohol/week: 0.0 oz  . Drug use: No    Allergies as of 03/05/2017 - Review Complete 03/05/2017  Allergen Reaction Noted  . Bee venom Anaphylaxis and Shortness Of Breath 12/05/2014  . Shellfish-derived products Anaphylaxis and Shortness Of Breath 12/05/2014  . Codeine  08/01/2014  . Thorazine [chlorpromazine] Swelling 09/23/2014  . Tylenol with codeine #3 [acetaminophen-codeine] Other (See Comments) 12/05/2014  . Metronidazole Rash 06/26/2015     Review of Systems:    All systems reviewed and negative except where noted in HPI.   Physical Exam:  Vital signs in last 24 hours: Temp:  [97.8 F (36.6 C)-98.7 F (37.1 C)] 98.7 F (37.1 C) (01/19 0820) Pulse Rate:  [83-92] 85 (01/19 0820) Resp:  [10-20] 20 (01/19 0820) BP: (111-132)/(41-109) 118/46 (01/19 0820) SpO2:  [96 %-100 %] 98 % (01/19 0820) Weight:  [240 lb (108.9 kg)] 240 lb (108.9 kg) (01/18 2055) Last BM Date: 03/05/17 General:   Pleasant, cooperative in NAD Head:  Normocephalic and atraumatic. Eyes:   No icterus.   Conjunctiva pink. PERRLA. Ears:  Normal auditory acuity. Neck:  Supple; no masses or thyroidomegaly Lungs: Respirations even and unlabored. Lungs clear to auscultation bilaterally.   No wheezes, crackles, or rhonchi.  Heart:  Regular rate and rhythm;  Without murmur, clicks, rubs or gallops Abdomen:  Soft, nondistended, nontender. Normal bowel sounds. No appreciable masses or hepatomegaly.  No rebound or guarding.  Rectal:  Not performed. Msk:  Symmetrical without gross deformities.    Extremities:  Without edema, cyanosis or clubbing. Neurologic:  Alert and oriented x3;  grossly normal neurologically. Skin:  Intact without significant lesions or rashes. Cervical Nodes:  No significant cervical adenopathy. Psych:  Alert and cooperative. Normal affect.  LAB RESULTS: Recent Labs    03/05/17 2105 03/06/17 0929  WBC 7.9 7.8  HGB 5.5* 7.6*  HCT 20.3* 26.2*  PLT 180 155   BMET Recent Labs    03/05/17 2105 03/06/17 0929  NA 137 140  K 3.4* 4.3  CL 98* 105  CO2 26 25  GLUCOSE 176* 175*  BUN 13 10  CREATININE 0.97 0.77  CALCIUM 8.7* 9.0   LFT Recent Labs    03/05/17 2105  PROT 6.9  ALBUMIN 3.7  AST 17  ALT 9*  ALKPHOS 135*  BILITOT 0.7   PT/INR No results for input(s): LABPROT, INR in the last 72 hours.  STUDIES: Ct Head Wo Contrast  Result Date: 03/05/2017 CLINICAL DATA:  Photophobia and headache. EXAM: CT HEAD WITHOUT  CONTRAST TECHNIQUE: Contiguous axial images were obtained from the base of the skull through the vertex without intravenous contrast. COMPARISON:  Head CT 10/28/2015 FINDINGS: Brain: No mass lesion, intraparenchymal hemorrhage or extra-axial collection. No evidence of acute cortical infarct. Brain parenchyma and CSF-containing spaces are normal for age. Vascular: No hyperdense vessel or unexpected calcification. Skull: Normal visualized skull base, calvarium and extracranial soft tissues. Sinuses/Orbits: No sinus fluid levels or advanced mucosal thickening. No mastoid effusion. Normal orbits. IMPRESSION: Normal head CT. Electronically Signed   By: Ulyses Jarred M.D.   On: 03/05/2017 22:26   Ct Abdomen Pelvis W Contrast  Result Date: 03/05/2017 CLINICAL DATA:  Headache right abdominal pain with nausea EXAM: CT ABDOMEN AND PELVIS WITH CONTRAST TECHNIQUE: Multidetector CT imaging of the abdomen and pelvis was performed using the standard protocol following bolus administration of intravenous contrast. CONTRAST:  112mL ISOVUE-300 IOPAMIDOL (ISOVUE-300) INJECTION 61% COMPARISON:  CT abdomen pelvis 08/01/2014 FINDINGS: Lower chest: Lung bases demonstrate no acute consolidation or effusion. Mild cardiomegaly. Hepatobiliary: No focal liver abnormality is seen. Status post cholecystectomy. No biliary dilatation. Pancreas: Unremarkable. No pancreatic ductal dilatation or surrounding inflammatory changes. Spleen: Normal in size without focal abnormality. Adrenals/Urinary Tract: Adrenal glands are within normal limits. No hydronephrosis. Slight increased size of a probable slightly dense cyst in the upper pole of the right kidney. Stomach/Bowel: Stomach is enlarged. Appendix appears normal. No evidence of bowel wall thickening, distention, or inflammatory changes. Vascular/Lymphatic: Aortic atherosclerosis. No enlarged abdominal or pelvic lymph nodes. Reproductive: Uterus and bilateral adnexa are unremarkable. Other:  Negative for free air or free fluid. Musculoskeletal: Degenerative changes of the spine. No acute or suspicious lesion IMPRESSION: No CT evidence for acute intra-abdominal or pelvic abnormality. Moderately enlarged stomach with debris and fluid. Electronically Signed   By: Donavan Foil M.D.   On: 03/05/2017 22:31      Impression / Plan:   Cristina [name] is a 57 y.o. y/o female with admission for symptomatically anemia and heme-positive stools. The patient was transfused 2 units of blood. The patient's hemoglobin has increased. The patient will be set up for an EGD for today to look for source of the GI bleeding. The patient should avoid NSAIDs upon discharge. I have discussed risks & benefits which include, but are not limited to, bleeding, infection, perforation & drug reaction.  The patient agrees with this plan & written consent will be obtained.      Thank you for involving me in the care of this patient.      LOS: 0 days   Cristina Lame, MD  03/06/2017, 11:18 AM   Note: This dictation was prepared with Dragon dictation along with smaller phrase technology. Any transcriptional errors that result from this process are unintentional.

## 2017-03-06 NOTE — ED Notes (Signed)
Assisted pt to bedside commode.

## 2017-03-06 NOTE — Op Note (Addendum)
[facility] Gastroenterology Patient Name: Cristina [name] Procedure Date: 03/06/2017 12:20 PM MRN: [id number] Account #: [id number] Date of Birth: Jan 16, 1961 Admit Type: Inpatient Age: 57 Room: [facility] ENDO ROOM 4 Gender: Female Note Status: Supervisor Override Procedure:            Upper GI endoscopy Indications:          Acute post hemorrhagic anemia, Heme positive stool Providers:            Lucilla Lame MD, MD Referring MD:         No Local Md, MD (Referring MD) Medicines:            Propofol per Anesthesia Complications:        No immediate complications. Procedure:            Pre-Anesthesia Assessment:                       - Prior to the procedure, a History and Physical was                        performed, and patient medications and allergies were                        reviewed. The patient's tolerance of previous                        anesthesia was also reviewed. The risks and benefits of                        the procedure and the sedation options and risks were                        discussed with the patient. All questions were                        answered, and informed consent was obtained. Prior                        Anticoagulants: The patient has taken no previous                        anticoagulant or antiplatelet agents. ASA Grade                        Assessment: II - A patient with mild systemic disease.                        After reviewing the risks and benefits, the patient was                        deemed in satisfactory condition to undergo the                        procedure.                       After obtaining informed consent, the endoscope was                        passed under direct vision. Throughout the procedure,  the patient's blood pressure, pulse, and oxygen                        saturations were monitored continuously. The Endoscope                        was introduced through the mouth, and  advanced to the                        second part of duodenum. The upper GI endoscopy was                        accomplished without difficulty. The patient tolerated                        the procedure well. Findings:      The examined esophagus was normal.      The entire examined stomach was normal.      The examined duodenum was normal. Impression:           - Normal esophagus.                       - Normal stomach.                       - Normal examined duodenum.                       - No specimens collected. Recommendation:       - Discharge patient to home.                       - Resume previous diet.                       - Continue present medications.                       - Will follow Hb. If decreased tomorrow then will prep                        for a colonoscopy for monday. Procedure Code(s):    --- Professional ---                       [phone], Esophagogastroduodenoscopy, flexible, transoral;                        diagnostic, including collection of specimen(s) by                        brushing or washing, when performed (separate procedure) Diagnosis Code(s):    --- Professional ---                       R19.5, Other fecal abnormalities                       D62, Acute posthemorrhagic anemia CPT copyright 2018 American [employer]. All rights reserved. The codes documented in this report are preliminary and upon coder review may  be revised to meet current compliance requirements. Lucilla Lame MD, MD 03/06/2017 12:45:11 PM This report has been signed electronically. Number  of Addenda: 0 Note Initiated On: 03/06/2017 12:20 PM      [facility]

## 2017-03-06 NOTE — ED Notes (Signed)
Given report to receiving nurse

## 2017-03-06 NOTE — ED Notes (Signed)
Assisted pt to bedside commode pt able to pivot with some assistance, pt denies further needs at present

## 2017-03-06 NOTE — Transfer of Care (Signed)
Immediate Anesthesia Transfer of Care Note  Patient: Cristina [name]  Procedure(s) Performed: ESOPHAGOGASTRODUODENOSCOPY (EGD) WITH PROPOFOL (N/A )  Patient Location: PACU  Anesthesia Type:General  Level of Consciousness: sedated and responds to stimulation  Airway & Oxygen Therapy: Patient Spontanous Breathing and Patient connected to nasal cannula oxygen  Post-op Assessment: Report given to RN and Post -op Vital signs reviewed and stable  Post vital signs: Reviewed and stable  Last Vitals:  Vitals:   03/06/17 0820 03/06/17 1245  BP: (!) 118/46 (!) 109/49  Pulse: 85 83  Resp: 20 13  Temp: 37.1 C   SpO2: 98% 100%    Last Pain:  Vitals:   03/06/17 0830  TempSrc:   PainSc: 0-No pain         Complications: No apparent anesthesia complications

## 2017-03-06 NOTE — Anesthesia Procedure Notes (Signed)
Performed by: Aliz Meritt, CRNA Pre-anesthesia Checklist: Patient identified, Emergency Drugs available, Suction available, Patient being monitored and Timeout performed Patient Re-evaluated:Patient Re-evaluated prior to induction Oxygen Delivery Method: Nasal cannula Induction Type: IV induction       

## 2017-03-06 NOTE — H&P (Signed)
[facility] NAME: Cristina [name]    MR#:  [id number]  DATE OF BIRTH:  1960/04/26  DATE OF ADMISSION:  03/05/2017  PRIMARY CARE PHYSICIAN: Cristina [name], No Pcp Per   REQUESTING/REFERRING PHYSICIAN:   CHIEF COMPLAINT:   Chief Complaint  Cristina [name] presents with  . Headache  . Abdominal Pain    HISTORY OF PRESENT ILLNESS: Cristina [name]  is a 57 y.o. female with a known history of congestive heart failure, emphysema, diabetes mellitus, COPD, degenerative disc disease, arthritis, GERD he is a resident of [facility].  She was referred for abdominal discomfort and generalized weakness.  The Cristina [name] has weakness and fatigue for the last couple of weeks.  She also says she has noticed some black stool.  Her Hemoccult was positive in the emergency room.  Hemoglobin was lower on 5.  No vomiting of blood or coughing of blood.  No abdominal discomfort is aching in nature 3 out of 10 on a scale of 1-10.  Cristina [name] sometimes gets on and off headache but no blurry vision.  Colonoscopy was done couple of years ago and few polyps were removed.  Hospitalist service was consulted.  2 units of PRBC transfusion was ordered by emergency room physician intravenously.  Cristina [name] currently on oral aspirin as outpatient.  She was worked up with CT head which showed no acute abnormality, CT abdomen which showed no acute pathology.  PAST MEDICAL HISTORY:   Past Medical History:  Diagnosis Date  . Abdominal hernia   . Arthritis   . Asthma   . Bladder dysfunction   . Carpal tunnel syndrome   . CHF (congestive heart failure) ([place])   . COPD (chronic obstructive pulmonary disease) ([place])   . DDD (degenerative disc disease), lumbar   . Diabetes mellitus without complication ([place])   . Dyspnea   . Edema   . Fatty tumor   . GERD (gastroesophageal reflux disease)   . H/O colonoscopy   . H/O tubal ligation    hx btl  . History of cholecystectomy    hx of cholecystectomy  . HLD (hyperlipidemia)   . Hypertension   . Muscle cramps   . Neuropathy   . Obesity   . Recurrent boils   . Schizoaffective disorder ([place])   . Sleep apnea   . Stroke ([place])   . UTI (lower urinary tract infection)   . Vaginitis     PAST SURGICAL HISTORY:  Past Surgical History:  Procedure Laterality Date  . CARDIAC CATHETERIZATION Left 09/24/2014   Procedure: Left Heart Cath and Coronary Angiography;  Surgeon: Dionisio David, MD;  Location: [facility];  Service: Cardiovascular;  Laterality: Left;  . CHOLECYSTECTOMY    . COLONOSCOPY WITH PROPOFOL N/A 08/12/2015   Procedure: COLONOSCOPY WITH PROPOFOL;  Surgeon: Lollie Sails, MD;  Location: [facility] ENDOSCOPY;  Service: Endoscopy;  Laterality: N/A;  . ESOPHAGOGASTRODUODENOSCOPY (EGD) WITH PROPOFOL N/A 08/12/2015   Procedure: ESOPHAGOGASTRODUODENOSCOPY (EGD) WITH PROPOFOL;  Surgeon: Lollie Sails, MD;  Location: [facility] ENDOSCOPY;  Service: Endoscopy;  Laterality: N/A;  . tubal ligation      SOCIAL HISTORY:  Social History   Tobacco Use  . Smoking status: Current Some Day Smoker    Packs/day: 0.25    Types: Cigarettes  . Smokeless tobacco: Never Used  Substance Use Topics  . Alcohol use: No    Alcohol/week: 0.0 oz    FAMILY HISTORY:  Family History  Problem Relation  Age of Onset  . CAD Mother   . Lung cancer Mother     DRUG ALLERGIES:  Allergies  Allergen Reactions  . Bee Venom Anaphylaxis and Shortness Of Breath    Stated by Cristina [name]  . Shellfish-Derived Products Anaphylaxis and Shortness Of Breath    Stated by Cristina [name]  . Codeine   . Thorazine [Chlorpromazine] Swelling  . Tylenol With Codeine #3 [Acetaminophen-Codeine] Other (See Comments)    Hallucinations  . Metronidazole Rash    REVIEW OF SYSTEMS:   CONSTITUTIONAL: No fever, has fatigue and weakness.  EYES: No blurred or double vision.  EARS, NOSE, AND THROAT: No tinnitus or ear pain.  RESPIRATORY: No cough, shortness of  breath, wheezing or hemoptysis.  CARDIOVASCULAR: No chest pain, orthopnea, edema.  GASTROINTESTINAL: No nausea, vomiting, diarrhea  has abdominal pain.  GENITOURINARY: No dysuria, hematuria.  ENDOCRINE: No polyuria, nocturia,  HEMATOLOGY: Has anemia,  No easy bruising or bleeding SKIN: No rash or lesion. MUSCULOSKELETAL: No joint pain or arthritis.   NEUROLOGIC: No tingling, numbness, weakness.  PSYCHIATRY: No anxiety or depression.   MEDICATIONS AT HOME:  Prior to Admission medications   Medication Sig Start Date End Date Taking? Authorizing Provider  acetaminophen (TYLENOL) 500 MG tablet Take 500 mg by mouth every 8 (eight) hours as needed for mild pain or moderate pain.   Yes [provider]  albuterol (PROVENTIL HFA;VENTOLIN HFA) 108 (90 BASE) MCG/ACT inhaler Inhale 2 puffs into the lungs every 6 (six) hours as needed for wheezing or shortness of breath.   Yes [provider]  aspirin EC 81 MG tablet Take 81 mg by mouth daily.   Yes [provider]  atorvastatin (LIPITOR) 20 MG tablet Take 20 mg by mouth at bedtime.   Yes [provider]  cetirizine (ZYRTEC) 10 MG tablet Take 10 mg by mouth daily.    Yes [provider]  clonazePAM (KLONOPIN) 0.5 MG tablet Take 1 tablet (0.5 mg total) by mouth 2 (two) times daily. 07/18/16  Yes Gouru, Illene Silver, MD  diphenoxylate-atropine (LOMOTIL) 2.5-0.025 MG tablet Take 1 tablet by mouth daily as needed for diarrhea or loose stools.   Yes [provider]  Fluticasone-Salmeterol (ADVAIR) 250-50 MCG/DOSE AEPB Inhale 1 puff into the lungs 2 (two) times daily.   Yes [provider]  furosemide (LASIX) 20 MG tablet Take 1 tablet (20 mg total) by mouth 2 (two) times daily. Cristina [name] taking differently: Take 20 mg by mouth 3 (three) times a week.  07/18/16  Yes Gouru, Illene Silver, MD  haloperidol (HALDOL) 5 MG tablet Take 1 tablet (5 mg total) by mouth at bedtime. 10/31/15  Yes Bettey Costa, MD  haloperidol  decanoate (HALDOL DECANOATE) 100 MG/ML injection Inject 1 mL (100 mg total) into the muscle every 30 (thirty) days. 08/02/15  Yes Hildred Priest, MD  insulin aspart (NOVOLOG) 100 UNIT/ML injection Inject 0-9 Units into the skin 3 (three) times daily with meals. CBG 70 - 120: 0 units CBG 121 - 150: 0 units CBG 151 - 200: 0 units CBG 201 - 250: 2 units CBG 251 - 300: 3 units CBG 301 - 350: 4 units CBG 351 - 400: 5 units Cristina [name] taking differently: Inject 0-12 Units into the skin 3 (three) times daily with meals. 250-300 6 units 301-350 8 units 351-400 10 units Greater than 400 12 units 07/18/16  Yes Gouru, Aruna, MD  insulin detemir (LEVEMIR) 100 UNIT/ML injection Inject 0.35 mLs (35 Units total) into the skin 2 (two) times daily.  Pt uses 50 units in the morning and 52 units at bedtime. Cristina [name] taking differently: Inject 40 Units into the skin 2 (two) times daily.  07/18/16  Yes Gouru, Illene Silver, MD  loperamide (IMODIUM A-D) 2 MG tablet Take 2 mg by mouth 4 (four) times daily as needed for diarrhea or loose stools.   Yes [provider]  meloxicam (MOBIC) 7.5 MG tablet Take 1 tablet (7.5 mg total) by mouth 2 (two) times daily. 08/02/15  Yes Hildred Priest, MD  menthol-cetylpyridinium (CEPACOL) 3 MG lozenge Take 1 lozenge by mouth as needed for sore throat.   Yes [provider]  metFORMIN (GLUCOPHAGE-XR) 500 MG 24 hr tablet Take 1,000 mg by mouth 2 (two) times daily.  05/23/15  Yes [provider]  metoprolol tartrate (LOPRESSOR) 25 MG tablet Take 0.5 tablets (12.5 mg total) by mouth 2 (two) times daily. Cristina [name] taking differently: Take 50 mg by mouth 2 (two) times daily.  07/18/16  Yes Gouru, Illene Silver, MD  nitroGLYCERIN (NITROSTAT) 0.3 MG SL tablet Place 0.3 mg under the tongue every 5 (five) minutes as needed for chest pain.   Yes [provider]  ondansetron (ZOFRAN) 4 MG tablet Take 4 mg by mouth every 6 (six) hours as needed for nausea or vomiting.    Yes [provider]  pantoprazole (PROTONIX) 40 MG tablet Take 40 mg by mouth 2 (two) times daily.   Yes [provider]  potassium chloride 20 MEQ TBCR Take 10 mEq by mouth daily. 07/18/16  Yes Gouru, Illene Silver, MD  pregabalin (LYRICA) 100 MG capsule Take 100 mg by mouth 3 (three) times daily.   Yes [provider]  senna-docusate (SENOKOT-S) 8.6-50 MG tablet Take 1 tablet by mouth at bedtime as needed for mild constipation. 07/18/16  Yes Gouru, Aruna, MD  umeclidinium-vilanterol (ANORO ELLIPTA) 62.5-25 MCG/INH AEPB Inhale 1 puff into the lungs daily.   Yes [provider]  venlafaxine XR (EFFEXOR-XR) 150 MG 24 hr capsule Take 225 mg by mouth daily with breakfast.   Yes [provider]  venlafaxine XR (EFFEXOR-XR) 75 MG 24 hr capsule Take 75 mg by mouth daily with breakfast.    Yes [provider]      PHYSICAL EXAMINATION:   VITAL SIGNS: Blood pressure (!) 125/49, pulse 83, temperature 97.8 F (36.6 C), temperature source Oral, resp. rate 17, height 4\' 11"  (1.499 m), weight 108.9 kg (240 lb), SpO2 98 %.  GENERAL:  57 y.o.-year-old Cristina [name] lying in the bed with no acute distress.  EYES: Pupils equal, round, reactive to light and accommodation. No scleral icterus. Extraocular muscles intact.  HEENT: Head atraumatic, normocephalic. Oropharynx and nasopharynx clear.  NECK:  Supple, no jugular venous distention. No thyroid enlargement, no tenderness.  LUNGS: Normal breath sounds bilaterally, no wheezing, rales,rhonchi or crepitation. No use of accessory muscles of respiration.  CARDIOVASCULAR: S1, S2 normal. No murmurs, rubs, or gallops.  ABDOMEN: Soft, mild tenderness around umbilicus, nondistended. Bowel sounds present. No organomegaly or mass.  EXTREMITIES: No pedal edema, cyanosis, or clubbing.  NEUROLOGIC: Cranial nerves II through XII are intact. Muscle strength 5/5 in all extremities. Sensation intact. Gait not checked.  PSYCHIATRIC: The  Cristina [name] is alert and oriented x 3.  SKIN: No obvious rash, lesion, or ulcer.   LABORATORY PANEL:   CBC Recent Labs  Lab 03/05/17 2105  WBC 7.9  HGB 5.5*  HCT 20.3*  PLT 180  MCV 55.7*  MCH 15.2*  MCHC 27.2*  RDW 21.1*  LYMPHSABS 1.1  MONOABS 0.6  EOSABS 0.1  BASOSABS 0.1   ------------------------------------------------------------------------------------------------------------------  Chemistries  Recent Labs  Lab 03/05/17 2105  NA 137  K 3.4*  CL 98*  CO2 26  GLUCOSE 176*  BUN 13  CREATININE 0.97  CALCIUM 8.7*  AST 17  ALT 9*  ALKPHOS 135*  BILITOT 0.7   ------------------------------------------------------------------------------------------------------------------ estimated creatinine clearance is 69.4 mL/min (by C-G formula based on SCr of 0.97 mg/dL). ------------------------------------------------------------------------------------------------------------------ No results for input(s): TSH, T4TOTAL, T3FREE, THYROIDAB in the last 72 hours.  Invalid input(s): FREET3   Coagulation profile No results for input(s): INR, PROTIME in the last 168 hours. ------------------------------------------------------------------------------------------------------------------- No results for input(s): DDIMER in the last 72 hours. -------------------------------------------------------------------------------------------------------------------  Cardiac Enzymes No results for input(s): CKMB, TROPONINI, MYOGLOBIN in the last 168 hours.  Invalid input(s): CK ------------------------------------------------------------------------------------------------------------------ Invalid input(s): POCBNP  ---------------------------------------------------------------------------------------------------------------  Urinalysis    Component Value Date/Time   COLORURINE STRAW (A) 03/05/2017 2105   APPEARANCEUR CLEAR (A) 03/05/2017 2105   APPEARANCEUR Clear 12/04/2014  1048   LABSPEC 1.000 (L) 03/05/2017 2105   LABSPEC 1.012 02/16/2014 1145   PHURINE 6.0 03/05/2017 2105   GLUCOSEU >=500 (A) 03/05/2017 2105   GLUCOSEU Negative 02/16/2014 1145   HGBUR NEGATIVE 03/05/2017 2105   BILIRUBINUR NEGATIVE 03/05/2017 2105   BILIRUBINUR Negative 12/04/2014 1048   BILIRUBINUR Negative 02/16/2014 [place] NEGATIVE 03/05/2017 2105   PROTEINUR NEGATIVE 03/05/2017 2105   NITRITE NEGATIVE 03/05/2017 2105   LEUKOCYTESUR TRACE (A) 03/05/2017 2105   LEUKOCYTESUR Negative 12/04/2014 1048   LEUKOCYTESUR 3+ 02/16/2014 1145     RADIOLOGY: Ct Head Wo Contrast  Result Date: 03/05/2017 CLINICAL DATA:  Photophobia and headache. EXAM: CT HEAD WITHOUT CONTRAST TECHNIQUE: Contiguous axial images were obtained from the base of the skull through the vertex without intravenous contrast. COMPARISON:  Head CT 10/28/2015 FINDINGS: Brain: No mass lesion, intraparenchymal hemorrhage or extra-axial collection. No evidence of acute cortical infarct. Brain parenchyma and CSF-containing spaces are normal for age. Vascular: No hyperdense vessel or unexpected calcification. Skull: Normal visualized skull base, calvarium and extracranial soft tissues. Sinuses/Orbits: No sinus fluid levels or advanced mucosal thickening. No mastoid effusion. Normal orbits. IMPRESSION: Normal head CT. Electronically Signed   By: Ulyses Jarred M.D.   On: 03/05/2017 22:26   Ct Abdomen Pelvis W Contrast  Result Date: 03/05/2017 CLINICAL DATA:  Headache right abdominal pain with nausea EXAM: CT ABDOMEN AND PELVIS WITH CONTRAST TECHNIQUE: Multidetector CT imaging of the abdomen and pelvis was performed using the standard protocol following bolus administration of intravenous contrast. CONTRAST:  159mL ISOVUE-300 IOPAMIDOL (ISOVUE-300) INJECTION 61% COMPARISON:  CT abdomen pelvis 08/01/2014 FINDINGS: Lower chest: Lung bases demonstrate no acute consolidation or effusion. Mild cardiomegaly. Hepatobiliary: No focal  liver abnormality is seen. Status post cholecystectomy. No biliary dilatation. Pancreas: Unremarkable. No pancreatic ductal dilatation or surrounding inflammatory changes. Spleen: Normal in size without focal abnormality. Adrenals/Urinary Tract: Adrenal glands are within normal limits. No hydronephrosis. Slight increased size of a probable slightly dense cyst in the upper pole of the right kidney. Stomach/Bowel: Stomach is enlarged. Appendix appears normal. No evidence of bowel wall thickening, distention, or inflammatory changes. Vascular/Lymphatic: Aortic atherosclerosis. No enlarged abdominal or pelvic lymph nodes. Reproductive: Uterus and bilateral adnexa are unremarkable. Other: Negative for free air or free fluid. Musculoskeletal: Degenerative changes of the spine. No acute or suspicious lesion IMPRESSION: No CT evidence for acute intra-abdominal or pelvic abnormality. Moderately enlarged stomach with debris and fluid. Electronically Signed   By: Madie Reno.D.  On: 03/05/2017 22:31    EKG: Orders placed or performed during the hospital encounter of 08/11/16  . ED EKG within 10 minutes  . ED EKG within 10 minutes  . EKG 12-Lead  . EKG 12-Lead  . EKG    IMPRESSION AND PLAN: 57 year old female Cristina [name] with history of emphysema, diabetes mellitus, congestive heart failure, GERD presented to the emergency room with weakness and fatigue.  Admitting diagnosis 1.  Symptomatic anemia 2.  Gastrointestinal bleeding 3.  Hypokalemia 4.  Diabetes mellitus type 2 5.  Emphysema Treatment plan Admit Cristina [name] to medical floor Transfuse 2 units PRBC intravenously IV Protonix 40 mg every 12 hourly Replace potassium Gastroenterology consultation Monitor hemoglobin hematocrit   All the records are reviewed and case discussed with ED provider. Management plans discussed with the Cristina [name], family and they are in agreement.  CODE STATUS:FULL CODE Code Status History    Date Active Date Inactive  Code Status Order ID Comments User Context   07/15/2016 07:48 07/18/2016 16:53 Full Code [id number]  Bettey Costa, MD Inpatient   10/28/2015 23:40 10/31/2015 18:52 Full Code [id number]  Holley Raring, NP ED   07/30/2015 23:33 08/03/2015 15:41 Full Code [id number]  Gonzella Lex, MD Inpatient   12/05/2014 02:25 12/07/2014 20:38 Full Code [id number]  Hildred Priest, MD Inpatient   09/24/2014 09:18 09/24/2014 17:37 Full Code [id number]  Dionisio David, MD Inpatient   09/23/2014 19:58 09/24/2014 09:18 Full Code [id number]  Dustin Flock, MD Inpatient       TOTAL TIME TAKING CARE OF THIS Cristina [name]: 50 minutes.    Saundra Shelling M.D on 03/06/2017 at 12:44 AM  Between 7am to 6pm - Pager - [phone]  After 6pm go to [email or web address] - password [email or web address]  Office  [phone]  CC: Primary care physician; Cristina [name], No Pcp Per

## 2017-03-06 NOTE — Anesthesia Preprocedure Evaluation (Signed)
Anesthesia Evaluation  Patient identified by MRN, date of birth, ID band Patient awake    Reviewed: Allergy & Precautions, H&P , NPO status , Patient's Chart, lab work & pertinent test results  History of Anesthesia Complications Negative for: history of anesthetic complications  Airway Mallampati: III  TM Distance: <3 FB Neck ROM: limited    Dental  (+) Chipped, Edentulous Upper, Edentulous Lower   Pulmonary shortness of breath and with exertion, asthma , sleep apnea , COPD, Current Smoker,           Cardiovascular Exercise Tolerance: Poor hypertension, (-) angina+ CAD, + Past MI and +CHF       Neuro/Psych PSYCHIATRIC DISORDERS Bipolar Disorder Schizophrenia  Neuromuscular disease CVA, Residual Symptoms    GI/Hepatic Neg liver ROS, GERD  Medicated and Controlled,  Endo/Other  diabetes  Renal/GU negative Renal ROS  negative genitourinary   Musculoskeletal  (+) Arthritis ,   Abdominal   Peds  Hematology negative hematology ROS (+)   Anesthesia Other Findings Past Medical History: No date: Abdominal hernia No date: Arthritis No date: Asthma No date: Bladder dysfunction No date: Carpal tunnel syndrome No date: CHF (congestive heart failure) ([facility]) No date: COPD (chronic obstructive pulmonary disease) ([facility]) No date: DDD (degenerative disc disease), lumbar No date: Diabetes mellitus without complication ([facility]) No date: Dyspnea No date: Edema No date: Fatty tumor No date: GERD (gastroesophageal reflux disease) No date: H/O colonoscopy No date: H/O tubal ligation     Comment:  hx btl No date: History of cholecystectomy     Comment:  hx of cholecystectomy No date: HLD (hyperlipidemia) No date: Hypertension No date: Muscle cramps No date: Neuropathy No date: Obesity No date: Recurrent boils No date: Schizoaffective disorder (HCC) No date: Sleep apnea No date: Stroke [facility]) No date: UTI (lower urinary tract  infection) No date: Vaginitis  Past Surgical History: 09/24/2014: CARDIAC CATHETERIZATION; Left     Comment:  Procedure: Left Heart Cath and Coronary Angiography;                Surgeon: Dionisio David, MD;  Location: [facility];  Service: Cardiovascular;  Laterality: Left; No date: CHOLECYSTECTOMY 08/12/2015: COLONOSCOPY WITH PROPOFOL; N/A     Comment:  Procedure: COLONOSCOPY WITH PROPOFOL;  Surgeon: Lollie Sails, MD;  Location: [facility] ENDOSCOPY;  Service:               Endoscopy;  Laterality: N/A; 08/12/2015: ESOPHAGOGASTRODUODENOSCOPY (EGD) WITH PROPOFOL; N/A     Comment:  Procedure: ESOPHAGOGASTRODUODENOSCOPY (EGD) WITH               PROPOFOL;  Surgeon: Lollie Sails, MD;  Location:               [facility] ENDOSCOPY;  Service: Endoscopy;  Laterality: N/A; No date: tubal ligation  BMI    Body Mass Index:  48.47 kg/m      Reproductive/Obstetrics negative OB ROS                            Anesthesia Physical Anesthesia Plan  ASA: III  Anesthesia Plan: General   Post-op Pain Management:    Induction: Intravenous  PONV Risk Score and Plan: Propofol infusion  Airway Management Planned: Natural Airway and Nasal Cannula  Additional Equipment:  Intra-op Plan:   Post-operative Plan:   Informed Consent: I have reviewed the patients History and Physical, chart, labs and discussed the procedure including the risks, benefits and alternatives for the proposed anesthesia with the patient or authorized representative who has indicated his/her understanding and acceptance.   Dental Advisory Given  Plan Discussed with: Anesthesiologist, CRNA and Surgeon  Anesthesia Plan Comments: (Patient consented for risks of anesthesia including but not limited to:  - adverse reactions to medications - risk of intubation if required - damage to teeth, lips or other oral mucosa - sore throat or hoarseness - Damage to heart,  brain, lungs or loss of life  Patient voiced understanding.)        Anesthesia Quick Evaluation

## 2017-03-06 NOTE — Progress Notes (Signed)
Patient seen and examined  Status post 2 units packed RBC transfusion Repeat hemoglobin pending. Discussed with Dr. Allen Norris of GI.  Will need EGD. Meloxicam from home at discharge

## 2017-03-06 NOTE — Anesthesia Post-op Follow-up Note (Signed)
Anesthesia QCDR form completed.        

## 2017-03-06 NOTE — Clinical Social Work Note (Signed)
CSW received consult that his patient admitted from [facility]. CSW will assess when able.  Santiago Bumpers, MSW, Latanya Presser [phone]

## 2017-03-07 ENCOUNTER — Encounter: Payer: Self-pay | Admitting: Gastroenterology

## 2017-03-07 LAB — TYPE AND SCREEN
ABO/RH(D): O POS
Antibody Screen: NEGATIVE
Unit division: 0
Unit division: 0

## 2017-03-07 LAB — BPAM RBC
Blood Product Expiration Date: 201902032359
Blood Product Expiration Date: 201902072359
ISSUE DATE / TIME: 201901190209
ISSUE DATE / TIME: 201901190437
Unit Type and Rh: 5100
Unit Type and Rh: 5100

## 2017-03-07 LAB — HEMOGLOBIN: Hemoglobin: 7.8 g/dL — ABNORMAL LOW (ref 12.0–16.0)

## 2017-03-07 MED ORDER — FERROUS SULFATE 325 (65 FE) MG PO TBEC: 325.0000 mg | DELAYED_RELEASE_TABLET | Freq: Two times a day (BID) | ORAL | 0 refills | Status: DC

## 2017-03-07 MED ORDER — TRAMADOL HCL 50 MG PO TABS
50.0000 mg | ORAL_TABLET | Freq: Four times a day (QID) | ORAL | Status: DC | PRN
  Administered 2017-03-07: 50 mg via ORAL
  Filled 2017-03-07: qty 1

## 2017-03-07 NOTE — NC FL2 (Signed)
[place] LEVEL OF CARE SCREENING TOOL     IDENTIFICATION  Patient Name: Cristina [name] Birthdate: 1960-05-24 Sex: female Admission Date (Current Location): 03/05/2017  [facility] and [place] Number:  Engineering geologist and Address:  [facility], [address], [place], [place] [address]      Provider Number: [id number]  Attending Physician Name and Address:  Hillary Bow, MD  Relative Name and Phone Number:       Current Level of Care: Domiciliary (Rest home) Recommended Level of Care: [place] Prior Approval Number:    Date Approved/Denied:   PASRR Number:    Discharge Plan: Domiciliary (Rest home)    Current Diagnoses: Patient Active Problem List   Diagnosis Date Noted  . GI bleed 03/06/2017  . Abdominal pain   . Symptomatic anemia   . Acute posthemorrhagic anemia   . Heme + stool   . Sepsis ([place]) 07/15/2016  . Septic shock ([place]) 10/28/2015  . COPD (chronic obstructive pulmonary disease) ([place]) 07/31/2015  . Aspirin overdose 07/30/2015  . Schizoaffective disorder, bipolar type ([place]) 12/05/2014  . HTN (hypertension) 12/05/2014  . Sleep apnea 12/05/2014  . Diabetes ([place]) 12/05/2014  . Incomplete bladder emptying 12/04/2014  . Recurrent UTI 12/04/2014  . Polyneuropathy 03/20/2009  . Arthritis, degenerative 05/23/2007  . HLD (hyperlipidemia) 05/23/2007    Orientation RESPIRATION BLADDER Height & Weight     Self, Time, Situation, Place  Normal Continent Weight: 240 lb (108.9 kg) Height:  4\' 11"  (149.9 cm)  BEHAVIORAL SYMPTOMS/MOOD NEUROLOGICAL BOWEL NUTRITION STATUS      Continent    AMBULATORY STATUS COMMUNICATION OF NEEDS Skin   Independent Verbally Normal                       Personal Care Assistance Level of Assistance  Bathing, Feeding, Dressing Bathing Assistance: Independent Feeding assistance: Independent Dressing Assistance: Independent     Functional Limitations Info              SPECIAL CARE FACTORS FREQUENCY                       Contractures Contractures Info: Not present    Additional Factors Info  Code Status, Allergies, Psychotropic Code Status Info: Full Allergies Info: Bee Venom, Shellfish-derived Products, Codeine, Thorazine Chlorpromazine, Tylenol With Codeine #3 Acetaminophen-codeine, Metronidazole Psychotropic Info: Klonopin, Haldol         Current Medications (03/07/2017):  This is the current hospital active medication list Current Facility-Administered Medications  Medication Dose Route Frequency Provider Last Rate Last Dose  . 0.9 %  sodium chloride infusion  250 mL Intravenous PRN Pyreddy, Reatha Harps, MD      . acetaminophen (TYLENOL) tablet 650 mg  650 mg Oral Q6H PRN Pyreddy, Reatha Harps, MD       Or  . acetaminophen (TYLENOL) suppository 650 mg  650 mg Rectal Q6H PRN Pyreddy, Pavan, MD      . albuterol (PROVENTIL) (2.5 MG/3ML) 0.083% nebulizer solution 2.5 mg  2.5 mg Inhalation Q6H PRN Pyreddy, Pavan, MD      . Chlorhexidine Gluconate Cloth 2 % PADS 6 each  6 each Topical Q0600 Pyreddy, Pavan, MD      . mometasone-formoterol (DULERA) 200-5 MCG/ACT inhaler 2 puff  2 puff Inhalation BID Saundra Shelling, MD   2 puff at 03/07/17 0906  . mupirocin ointment (BACTROBAN) 2 % 1 application  1 application Nasal BID Saundra Shelling, MD   1  application at 20/25/42 0907  . ondansetron (ZOFRAN) tablet 4 mg  4 mg Oral Q6H PRN Pyreddy, Reatha Harps, MD   4 mg at 03/06/17 0700   Or  . ondansetron (ZOFRAN) injection 4 mg  4 mg Intravenous Q6H PRN Saundra Shelling, MD   4 mg at 03/07/17 1008  . pantoprazole (PROTONIX) injection 40 mg  40 mg Intravenous Q12H Saundra Shelling, MD   40 mg at 03/07/17 0906  . pneumococcal 23 valent vaccine (PNU-IMMUNE) injection 0.5 mL  0.5 mL Intramuscular Tomorrow-1000 Pyreddy, Pavan, MD      . sodium chloride flush (NS) 0.9 % injection 3 mL  3 mL Intravenous Q12H Saundra Shelling, MD   3 mL at 03/07/17 0907  . sodium chloride flush (NS) 0.9  % injection 3 mL  3 mL Intravenous PRN Pyreddy, Reatha Harps, MD      . traMADol Veatrice Bourbon) tablet 50 mg  50 mg Oral Q6H PRN Hillary Bow, MD   50 mg at 03/07/17 7062     Discharge Medications: Medication List     STOP taking these medications   meloxicam 7.5 MG tablet Commonly known as:  MOBIC     TAKE these medications   acetaminophen 500 MG tablet Commonly known as:  TYLENOL Take 500 mg by mouth every 8 (eight) hours as needed for mild pain or moderate pain.   albuterol 108 (90 [name]) MCG/ACT inhaler Commonly known as:  PROVENTIL HFA;VENTOLIN HFA Inhale 2 puffs into the lungs every 6 (six) hours as needed for wheezing or shortness of breath.   ANORO ELLIPTA 62.5-25 MCG/INH Aepb Generic drug:  umeclidinium-vilanterol Inhale 1 puff into the lungs daily.   aspirin EC 81 MG tablet Take 81 mg by mouth daily.   atorvastatin 20 MG tablet Commonly known as:  LIPITOR Take 20 mg by mouth at bedtime.   cetirizine 10 MG tablet Commonly known as:  ZYRTEC Take 10 mg by mouth daily.   clonazePAM 0.5 MG tablet Commonly known as:  KLONOPIN Take 1 tablet (0.5 mg total) by mouth 2 (two) times daily.   diphenoxylate-atropine 2.5-0.025 MG tablet Commonly known as:  LOMOTIL Take 1 tablet by mouth daily as needed for diarrhea or loose stools.   ferrous sulfate 325 (65 FE) MG EC tablet Take 1 tablet (325 mg total) by mouth 2 (two) times daily.   Fluticasone-Salmeterol 250-50 MCG/DOSE Aepb Commonly known as:  ADVAIR Inhale 1 puff into the lungs 2 (two) times daily.   furosemide 20 MG tablet Commonly known as:  LASIX Take 1 tablet (20 mg total) by mouth 2 (two) times daily. What changed:  when to take this   haloperidol 5 MG tablet Commonly known as:  HALDOL Take 1 tablet (5 mg total) by mouth at bedtime.   haloperidol decanoate 100 MG/ML injection Commonly known as:  HALDOL DECANOATE Inject 1 mL (100 mg total) into the muscle every 30 (thirty) days.   insulin aspart  100 UNIT/ML injection Commonly known as:  novoLOG Inject 0-9 Units into the skin 3 (three) times daily with meals. CBG 70 - 120: 0 units CBG 121 - 150: 0 units CBG 151 - 200: 0 units CBG 201 - 250: 2 units CBG 251 - 300: 3 units CBG 301 - 350: 4 units CBG 351 - 400: 5 units What changed:    how much to take  additional instructions   insulin detemir 100 UNIT/ML injection Commonly known as:  LEVEMIR Inject 0.35 mLs (35 Units total) into the skin 2 (two)  times daily. Pt uses 50 units in the morning and 52 units at bedtime. What changed:    how much to take  additional instructions   loperamide 2 MG tablet Commonly known as:  IMODIUM A-D Take 2 mg by mouth 4 (four) times daily as needed for diarrhea or loose stools.   menthol-cetylpyridinium 3 MG lozenge Commonly known as:  CEPACOL Take 1 lozenge by mouth as needed for sore throat.   metFORMIN 500 MG 24 hr tablet Commonly known as:  GLUCOPHAGE-XR Take 1,000 mg by mouth 2 (two) times daily.   metoprolol tartrate 25 MG tablet Commonly known as:  LOPRESSOR Take 0.5 tablets (12.5 mg total) by mouth 2 (two) times daily. What changed:  how much to take   nitroGLYCERIN 0.3 MG SL tablet Commonly known as:  NITROSTAT Place 0.3 mg under the tongue every 5 (five) minutes as needed for chest pain.   ondansetron 4 MG tablet Commonly known as:  ZOFRAN Take 4 mg by mouth every 6 (six) hours as needed for nausea or vomiting.   pantoprazole 40 MG tablet Commonly known as:  PROTONIX Take 40 mg by mouth 2 (two) times daily.   Potassium Chloride ER 20 MEQ Tbcr Take 10 mEq by mouth daily.   pregabalin 100 MG capsule Commonly known as:  LYRICA Take 100 mg by mouth 3 (three) times daily.   senna-docusate 8.6-50 MG tablet Commonly known as:  Senokot-S Take 1 tablet by mouth at bedtime as needed for mild constipation.   venlafaxine XR 75 MG 24 hr capsule Commonly known as:  EFFEXOR-XR Take 75 mg by mouth daily with  breakfast.   venlafaxine XR 150 MG 24 hr capsule Commonly known as:  EFFEXOR-XR Take 225 mg by mouth daily with breakfast.          Relevant Imaging Results:  Relevant Lab Results:   Additional Information    Zettie Pho, LCSW

## 2017-03-07 NOTE — Progress Notes (Signed)
Cristina Lame, MD [facility]   [address] [place], [place] [address] Phone: [phone] Fax : [phone]   Subjective: The patient denies any abdominal pain today she had an upper endoscopy without any source of any GI bleeding seen. The patient also has not had any further signs of any bleeding overnight. The patient's hemoglobin has been stable.   Objective: Vital signs in last 24 hours: Vitals:   03/06/17 1249 03/06/17 1259 03/06/17 1309 03/06/17 1629  BP: (!) 112/53 (!) 101/54 (!) 110/55 (!) 134/58  Pulse:    80  Resp: 12     Temp: 98.2 F (36.8 C) 97.8 F (36.6 C)  98.5 F (36.9 C)  TempSrc: Tympanic Tympanic  Oral  SpO2:    100%  Weight:      Height:       Weight change:   Intake/Output Summary (Last 24 hours) at 03/07/2017 0919 Last data filed at 03/07/2017 4034 Gross per 24 hour  Intake 438 ml  Output 806 ml  Net -368 ml     Exam: Heart:: Regular rate and rhythm, S1S2 present or without murmur or extra heart sounds Lungs: normal and clear to auscultation and percussion Abdomen: soft, nontender, normal bowel sounds   Lab Results: @LABTEST2 @ Micro Results: Recent Results (from the past 240 hour(s))  MRSA PCR Screening     Status: Abnormal   Collection Time: 03/06/17  2:03 AM  Result Value Ref Range Status   MRSA by PCR POSITIVE (A) NEGATIVE Final    Comment:        The GeneXpert MRSA Assay (FDA approved for NASAL specimens only), is one component of a comprehensive MRSA colonization surveillance program. It is not intended to diagnose MRSA infection nor to guide or monitor treatment for MRSA infections. RESULT CALLED TO, READ BACK BY AND VERIFIED WITH: PATRICIA KING AT 7425 03/06/17.PMH Performed at [facility], [address] [place], [place] [address]    Studies/Results: Ct Head Wo Contrast  Result Date: 03/05/2017 CLINICAL DATA:  Photophobia and headache. EXAM: CT HEAD WITHOUT CONTRAST TECHNIQUE: Contiguous axial images  were obtained from the base of the skull through the vertex without intravenous contrast. COMPARISON:  Head CT 10/28/2015 FINDINGS: Brain: No mass lesion, intraparenchymal hemorrhage or extra-axial collection. No evidence of acute cortical infarct. Brain parenchyma and CSF-containing spaces are normal for age. Vascular: No hyperdense vessel or unexpected calcification. Skull: Normal visualized skull base, calvarium and extracranial soft tissues. Sinuses/Orbits: No sinus fluid levels or advanced mucosal thickening. No mastoid effusion. Normal orbits. IMPRESSION: Normal head CT. Electronically Signed   By: Ulyses Jarred M.D.   On: 03/05/2017 22:26   Ct Abdomen Pelvis W Contrast  Result Date: 03/05/2017 CLINICAL DATA:  Headache right abdominal pain with nausea EXAM: CT ABDOMEN AND PELVIS WITH CONTRAST TECHNIQUE: Multidetector CT imaging of the abdomen and pelvis was performed using the standard protocol following bolus administration of intravenous contrast. CONTRAST:  160mL ISOVUE-300 IOPAMIDOL (ISOVUE-300) INJECTION 61% COMPARISON:  CT abdomen pelvis 08/01/2014 FINDINGS: Lower chest: Lung bases demonstrate no acute consolidation or effusion. Mild cardiomegaly. Hepatobiliary: No focal liver abnormality is seen. Status post cholecystectomy. No biliary dilatation. Pancreas: Unremarkable. No pancreatic ductal dilatation or surrounding inflammatory changes. Spleen: Normal in size without focal abnormality. Adrenals/Urinary Tract: Adrenal glands are within normal limits. No hydronephrosis. Slight increased size of a probable slightly dense cyst in the upper pole of the right kidney. Stomach/Bowel: Stomach is enlarged. Appendix appears normal. No evidence of bowel wall thickening, distention, or  inflammatory changes. Vascular/Lymphatic: Aortic atherosclerosis. No enlarged abdominal or pelvic lymph nodes. Reproductive: Uterus and bilateral adnexa are unremarkable. Other: Negative for free air or free fluid.  Musculoskeletal: Degenerative changes of the spine. No acute or suspicious lesion IMPRESSION: No CT evidence for acute intra-abdominal or pelvic abnormality. Moderately enlarged stomach with debris and fluid. Electronically Signed   By: Donavan Foil M.D.   On: 03/05/2017 22:31   Medications: I have reviewed the patient's current medications. Scheduled Meds: . Chlorhexidine Gluconate Cloth  6 each Topical Q0600  . mometasone-formoterol  2 puff Inhalation BID  . mupirocin ointment  1 application Nasal BID  . pantoprazole (PROTONIX) IV  40 mg Intravenous Q12H  . pneumococcal 23 valent vaccine  0.5 mL Intramuscular Tomorrow-1000  . sodium chloride flush  3 mL Intravenous Q12H   Continuous Infusions: . sodium chloride     PRN Meds:.sodium chloride, acetaminophen **OR** acetaminophen, albuterol, ondansetron **OR** ondansetron (ZOFRAN) IV, sodium chloride flush, traMADol   Assessment: Active Problems:   GI bleed   Abdominal pain   Symptomatic anemia   Acute posthemorrhagic anemia   Heme + stool    Plan: The patient has been doing well overnight and had an EGD without any source of the anemia being found. The patient has a stable hemoglobin this time. If the patient's hemoglobin should drop or she has any further signs of bleeding then we will consider prepping the patient for colonoscopy during this hospital admission otherwise the patient should follow up as an outpatient.   LOS: 1 day   Cristina [name] 03/07/2017, 9:19 AM

## 2017-03-07 NOTE — Progress Notes (Signed)
Pt was given discharge instructions, VS were WDL, Iv removed and she was sent back to her assisted living facility by her daughter. Her prescription was sent also. She had no questions and she was wheeled out to her daughter's car.

## 2017-03-07 NOTE — Clinical Social Work Note (Signed)
Clinical Social Work Assessment  Patient Details  Name: Cristina [name] MRN: [id number] Date of Birth: 1960-11-20  Date of referral:  03/07/17               Reason for consult:  Facility Placement                Permission sought to share information with:  Chartered certified accountant granted to share information::  Yes, Verbal Permission Granted  Name::        Agency::  [facility]  Relationship::     Contact Information:     Housing/Transportation Living arrangements for the past 2 months:  [place] of Information:  Patient, Medical Team, Adult Children, Facility Patient Interpreter Needed:  None Criminal Activity/Legal Involvement Pertinent to Current Situation/Hospitalization:  No - Comment as needed Significant Relationships:  Adult Children, Community Support Lives with:  Facility Resident Do you feel safe going back to the place where you live?  Yes Need for family participation in patient care:  No (Coment)  Care giving concerns:  Patient admitted from ALF   Social Worker assessment / plan:  CSW spoke with the patient and her daughter at bedside to assist with discharge planning. The patient is a resident at [facility] and will return there today to be transported by her daughter. The CSW has completed all discharge paperwork and provided such to the attending RN. CSW contacted [facility] to confirm return, and the facility representative confirmed that the patient can return today. CSW did not assign in the [place] as the [place] listed is not the one the patient resides in. The CSW is signing off. Please consult should additional needs arise.    Employment status:  Retired Forensic scientist:  Medicare PT Recommendations:  Not assessed at this time Information / Referral to community resources:     Patient/Family's Response to care:  Patient and daughter thanked CSW.  Patient/Family's Understanding of and Emotional Response to  Diagnosis, Current Treatment, and Prognosis:  The patient and daughter are in agreement with return to the facility of origin. The facility is in agreement. The patient will discharge immediately after discharge packet provided.  Emotional Assessment Appearance:  Appears younger than stated age Attitude/Demeanor/Rapport:  Engaged Affect (typically observed):  Appropriate, Pleasant Orientation:  Oriented to Self, Oriented to Place, Oriented to  Time, Oriented to Situation Alcohol / Substance use:  Never Used Psych involvement (Current and /or in the community):  No (Comment)  Discharge Needs  Concerns to be addressed:  Care Coordination, Discharge Planning Concerns Readmission within the last 30 days:  No Current discharge risk:  None Barriers to Discharge:  No Barriers Identified   Zettie Pho, LCSW 03/07/2017, 12:34 PM

## 2017-03-07 NOTE — Discharge Summary (Signed)
[facility] NAME: Trevia Nop    MR#:  [id number]  DATE OF BIRTH:  08/05/1960  DATE OF ADMISSION:  03/05/2017 ADMITTING PHYSICIAN: Saundra Shelling, MD  DATE OF DISCHARGE:   PRIMARY CARE PHYSICIAN: Patient, No Pcp Per   ADMISSION DIAGNOSIS:  Symptomatic anemia [D64.9] Abdominal pain, unspecified abdominal location [R10.9] Nonintractable episodic headache, unspecified headache type [R51]  DISCHARGE DIAGNOSIS:  Active Problems:   GI bleed   Abdominal pain   Symptomatic anemia   Acute posthemorrhagic anemia   Heme + stool   SECONDARY DIAGNOSIS:   Past Medical History:  Diagnosis Date  . Abdominal hernia   . Arthritis   . Asthma   . Bladder dysfunction   . Carpal tunnel syndrome   . CHF (congestive heart failure) ([place])   . COPD (chronic obstructive pulmonary disease) ([place])   . DDD (degenerative disc disease), lumbar   . Diabetes mellitus without complication ([place])   . Dyspnea   . Edema   . Fatty tumor   . GERD (gastroesophageal reflux disease)   . H/O colonoscopy   . H/O tubal ligation    hx btl  . History of cholecystectomy    hx of cholecystectomy  . HLD (hyperlipidemia)   . Hypertension   . Muscle cramps   . Neuropathy   . Obesity   . Recurrent boils   . Schizoaffective disorder ([place])   . Sleep apnea   . Stroke ([place])   . UTI (lower urinary tract infection)   . Vaginitis      ADMITTING HISTORY  HISTORY OF PRESENT ILLNESS: Archita Lomeli  is a 57 y.o. female with a known history of congestive heart failure, emphysema, diabetes mellitus, COPD, degenerative disc disease, arthritis, GERD he is a resident of [facility].  She was referred for abdominal discomfort and generalized weakness.  The patient has weakness and fatigue for the last couple of weeks.  She also says she has noticed some black stool.  Her Hemoccult was positive in the emergency room.  Hemoglobin was lower on 5.  No  vomiting of blood or coughing of blood.  No abdominal discomfort is aching in nature 3 out of 10 on a scale of 1-10.  Patient sometimes gets on and off headache but no blurry vision.  Colonoscopy was done couple of years ago and few polyps were removed.  Hospitalist service was consulted.  2 units of PRBC transfusion was ordered by emergency room physician intravenously.  Patient currently on oral aspirin as outpatient.  She was worked up with CT head which showed no acute abnormality, CT abdomen which showed no acute pathology.    HOSPITAL COURSE:   *Acute lower GI bleed. *Acute blood loss anemia *Fatigue due to anemia *COPD without exacerbation *Morbid obesity *GERD  Patient was admitted to medical floor.  Transfused 2 units packed RBC with hemoglobin increased from 5.5-7.5 after transfusion.  Patient had EGD done which showed normal esophagus stomach and duodenum.  No blood found on endoscopy.  Her bleeding was thought to be likely lower GI bleed per GI.  Discussed with Dr. Verl Blalock who suggested discharging patient home if no further bleeding and hemoglobin stable.  Outpatient follow-up for colonoscopy.  Today patient's hemoglobin has increased from 7.5-7.8.  She had bowel movement without any blood in it.  Patient is being started on iron supplements and discharged home.  She is already on PPIs for GERD.  Discharge back to assisted  living facility in stable condition.  Follow-up with GI in 1-2 weeks.  CONSULTS OBTAINED:  Treatment Team:  Lucilla Lame, MD  DRUG ALLERGIES:   Allergies  Allergen Reactions  . Bee Venom Anaphylaxis and Shortness Of Breath    Stated by Patient  . Shellfish-Derived Products Anaphylaxis and Shortness Of Breath    Stated by Patient  . Codeine   . Thorazine [Chlorpromazine] Swelling  . Tylenol With Codeine #3 [Acetaminophen-Codeine] Other (See Comments)    Hallucinations  . Metronidazole Rash    DISCHARGE MEDICATIONS:   Allergies as of 03/07/2017       Reactions   Bee Venom Anaphylaxis, Shortness Of Breath   Stated by Patient   Shellfish-derived Products Anaphylaxis, Shortness Of Breath   Stated by Patient   Codeine    Thorazine [chlorpromazine] Swelling   Tylenol With Codeine #3 [acetaminophen-codeine] Other (See Comments)   Hallucinations   Metronidazole Rash      Medication List    STOP taking these medications   meloxicam 7.5 MG tablet Commonly known as:  MOBIC     TAKE these medications   acetaminophen 500 MG tablet Commonly known as:  TYLENOL Take 500 mg by mouth every 8 (eight) hours as needed for mild pain or moderate pain.   albuterol 108 (90 Base) MCG/ACT inhaler Commonly known as:  PROVENTIL HFA;VENTOLIN HFA Inhale 2 puffs into the lungs every 6 (six) hours as needed for wheezing or shortness of breath.   ANORO ELLIPTA 62.5-25 MCG/INH Aepb Generic drug:  umeclidinium-vilanterol Inhale 1 puff into the lungs daily.   aspirin EC 81 MG tablet Take 81 mg by mouth daily.   atorvastatin 20 MG tablet Commonly known as:  LIPITOR Take 20 mg by mouth at bedtime.   cetirizine 10 MG tablet Commonly known as:  ZYRTEC Take 10 mg by mouth daily.   clonazePAM 0.5 MG tablet Commonly known as:  KLONOPIN Take 1 tablet (0.5 mg total) by mouth 2 (two) times daily.   diphenoxylate-atropine 2.5-0.025 MG tablet Commonly known as:  LOMOTIL Take 1 tablet by mouth daily as needed for diarrhea or loose stools.   ferrous sulfate 325 (65 FE) MG EC tablet Take 1 tablet (325 mg total) by mouth 2 (two) times daily.   Fluticasone-Salmeterol 250-50 MCG/DOSE Aepb Commonly known as:  ADVAIR Inhale 1 puff into the lungs 2 (two) times daily.   furosemide 20 MG tablet Commonly known as:  LASIX Take 1 tablet (20 mg total) by mouth 2 (two) times daily. What changed:  when to take this   haloperidol 5 MG tablet Commonly known as:  HALDOL Take 1 tablet (5 mg total) by mouth at bedtime.   haloperidol decanoate 100 MG/ML  injection Commonly known as:  HALDOL DECANOATE Inject 1 mL (100 mg total) into the muscle every 30 (thirty) days.   insulin aspart 100 UNIT/ML injection Commonly known as:  novoLOG Inject 0-9 Units into the skin 3 (three) times daily with meals. CBG 70 - 120: 0 units CBG 121 - 150: 0 units CBG 151 - 200: 0 units CBG 201 - 250: 2 units CBG 251 - 300: 3 units CBG 301 - 350: 4 units CBG 351 - 400: 5 units What changed:    how much to take  additional instructions   insulin detemir 100 UNIT/ML injection Commonly known as:  LEVEMIR Inject 0.35 mLs (35 Units total) into the skin 2 (two) times daily. Pt uses 50 units in the morning and 52 units at bedtime.  What changed:    how much to take  additional instructions   loperamide 2 MG tablet Commonly known as:  IMODIUM A-D Take 2 mg by mouth 4 (four) times daily as needed for diarrhea or loose stools.   menthol-cetylpyridinium 3 MG lozenge Commonly known as:  CEPACOL Take 1 lozenge by mouth as needed for sore throat.   metFORMIN 500 MG 24 hr tablet Commonly known as:  GLUCOPHAGE-XR Take 1,000 mg by mouth 2 (two) times daily.   metoprolol tartrate 25 MG tablet Commonly known as:  LOPRESSOR Take 0.5 tablets (12.5 mg total) by mouth 2 (two) times daily. What changed:  how much to take   nitroGLYCERIN 0.3 MG SL tablet Commonly known as:  NITROSTAT Place 0.3 mg under the tongue every 5 (five) minutes as needed for chest pain.   ondansetron 4 MG tablet Commonly known as:  ZOFRAN Take 4 mg by mouth every 6 (six) hours as needed for nausea or vomiting.   pantoprazole 40 MG tablet Commonly known as:  PROTONIX Take 40 mg by mouth 2 (two) times daily.   Potassium Chloride ER 20 MEQ Tbcr Take 10 mEq by mouth daily.   pregabalin 100 MG capsule Commonly known as:  LYRICA Take 100 mg by mouth 3 (three) times daily.   senna-docusate 8.6-50 MG tablet Commonly known as:  Senokot-S Take 1 tablet by mouth at bedtime as needed for  mild constipation.   venlafaxine XR 75 MG 24 hr capsule Commonly known as:  EFFEXOR-XR Take 75 mg by mouth daily with breakfast.   venlafaxine XR 150 MG 24 hr capsule Commonly known as:  EFFEXOR-XR Take 225 mg by mouth daily with breakfast.       Today   VITAL SIGNS:  Blood pressure (!) 134/58, pulse 80, temperature 98.5 F (36.9 C), temperature source Oral, resp. rate 12, height 4\' 11"  (1.499 m), weight 108.9 kg (240 lb), SpO2 100 %.  I/O:    Intake/Output Summary (Last 24 hours) at 03/07/2017 1222 Last data filed at 03/07/2017 1000 Gross per 24 hour  Intake 435 ml  Output 906 ml  Net -471 ml    PHYSICAL EXAMINATION:  Physical Exam  GENERAL:  57 y.o.-year-old patient lying in the bed with no acute distress.  Morbidly obese LUNGS: Normal breath sounds bilaterally, no wheezing, rales,rhonchi or crepitation. No use of accessory muscles of respiration.  CARDIOVASCULAR: S1, S2 normal. No murmurs, rubs, or gallops.  ABDOMEN: Soft, non-tender, non-distended. Bowel sounds present. No organomegaly or mass.  NEUROLOGIC: Moves all 4 extremities. PSYCHIATRIC: The patient is alert and awake SKIN: No obvious rash, lesion, or ulcer.   DATA REVIEW:   CBC Recent Labs  Lab 03/06/17 0929  03/07/17 0431  WBC 7.8  --   --   HGB 7.6*   < > 7.8*  HCT 26.2*  --   --   PLT 155  --   --    < > = values in this interval not displayed.    Chemistries  Recent Labs  Lab 03/05/17 2105 03/06/17 0929  NA 137 140  K 3.4* 4.3  CL 98* 105  CO2 26 25  GLUCOSE 176* 175*  BUN 13 10  CREATININE 0.97 0.77  CALCIUM 8.7* 9.0  AST 17  --   ALT 9*  --   ALKPHOS 135*  --   BILITOT 0.7  --     Cardiac Enzymes No results for input(s): TROPONINI in the last 168 hours.  Microbiology Results  Results for orders placed or performed during the hospital encounter of 03/05/17  MRSA PCR Screening     Status: Abnormal   Collection Time: 03/06/17  2:03 AM  Result Value Ref Range Status   MRSA  by PCR POSITIVE (A) NEGATIVE Final    Comment:        The GeneXpert MRSA Assay (FDA approved for NASAL specimens only), is one component of a comprehensive MRSA colonization surveillance program. It is not intended to diagnose MRSA infection nor to guide or monitor treatment for MRSA infections. RESULT CALLED TO, READ BACK BY AND VERIFIED WITH: PATRICIA KING AT 3220 03/06/17.PMH Performed at [facility], [address], [place], [place] [address]     RADIOLOGY:  Ct Head Wo Contrast  Result Date: 03/05/2017 CLINICAL DATA:  Photophobia and headache. EXAM: CT HEAD WITHOUT CONTRAST TECHNIQUE: Contiguous axial images were obtained from the base of the skull through the vertex without intravenous contrast. COMPARISON:  Head CT 10/28/2015 FINDINGS: Brain: No mass lesion, intraparenchymal hemorrhage or extra-axial collection. No evidence of acute cortical infarct. Brain parenchyma and CSF-containing spaces are normal for age. Vascular: No hyperdense vessel or unexpected calcification. Skull: Normal visualized skull base, calvarium and extracranial soft tissues. Sinuses/Orbits: No sinus fluid levels or advanced mucosal thickening. No mastoid effusion. Normal orbits. IMPRESSION: Normal head CT. Electronically Signed   By: Ulyses Jarred M.D.   On: 03/05/2017 22:26   Ct Abdomen Pelvis W Contrast  Result Date: 03/05/2017 CLINICAL DATA:  Headache right abdominal pain with nausea EXAM: CT ABDOMEN AND PELVIS WITH CONTRAST TECHNIQUE: Multidetector CT imaging of the abdomen and pelvis was performed using the standard protocol following bolus administration of intravenous contrast. CONTRAST:  148mL ISOVUE-300 IOPAMIDOL (ISOVUE-300) INJECTION 61% COMPARISON:  CT abdomen pelvis 08/01/2014 FINDINGS: Lower chest: Lung bases demonstrate no acute consolidation or effusion. Mild cardiomegaly. Hepatobiliary: No focal liver abnormality is seen. Status post cholecystectomy. No biliary dilatation. Pancreas:  Unremarkable. No pancreatic ductal dilatation or surrounding inflammatory changes. Spleen: Normal in size without focal abnormality. Adrenals/Urinary Tract: Adrenal glands are within normal limits. No hydronephrosis. Slight increased size of a probable slightly dense cyst in the upper pole of the right kidney. Stomach/Bowel: Stomach is enlarged. Appendix appears normal. No evidence of bowel wall thickening, distention, or inflammatory changes. Vascular/Lymphatic: Aortic atherosclerosis. No enlarged abdominal or pelvic lymph nodes. Reproductive: Uterus and bilateral adnexa are unremarkable. Other: Negative for free air or free fluid. Musculoskeletal: Degenerative changes of the spine. No acute or suspicious lesion IMPRESSION: No CT evidence for acute intra-abdominal or pelvic abnormality. Moderately enlarged stomach with debris and fluid. Electronically Signed   By: Donavan Foil M.D.   On: 03/05/2017 22:31    Follow up with PCP in 1 week.  Management plans discussed with the patient, family and they are in agreement.  CODE STATUS:     Code Status Orders  (From admission, onward)        Start     Ordered   03/06/17 0146  Full code  Continuous     03/06/17 0145    Code Status History    Date Active Date Inactive Code Status Order ID Comments User Context   07/15/2016 07:48 07/18/2016 16:53 Full Code [id number]  Bettey Costa, MD Inpatient   10/28/2015 23:40 10/31/2015 18:52 Full Code [id number]  Holley Raring, NP ED   07/30/2015 23:33 08/03/2015 15:41 Full Code [id number]  Gonzella Lex, MD Inpatient   12/05/2014 02:25 12/07/2014 20:38 Full Code [id number]  Hildred Priest,  MD Inpatient   09/24/2014 09:18 09/24/2014 17:37 Full Code [id number]  Dionisio David, MD Inpatient   09/23/2014 19:58 09/24/2014 09:18 Full Code [id number]  Dustin Flock, MD Inpatient      TOTAL TIME TAKING CARE OF THIS PATIENT ON DAY OF DISCHARGE: more than 30 minutes.   Leia Alf Arlenne Kimbley M.D on 03/07/2017 at 12:22  PM  Between 7am to 6pm - Pager - [phone]  After 6pm go to [email or web address] - password [email or web address] [facility]  Office  [phone]  CC: Primary care physician; Patient, No Pcp Per  Note: This dictation was prepared with Dragon dictation along with smaller phrase technology. Any transcriptional errors that result from this process are unintentional.

## 2017-03-29 ENCOUNTER — Ambulatory Visit (INDEPENDENT_AMBULATORY_CARE_PROVIDER_SITE_OTHER): Payer: Medicare Other | Admitting: Gastroenterology

## 2017-03-29 ENCOUNTER — Other Ambulatory Visit
Admission: RE | Admit: 2017-03-29 | Discharge: 2017-03-29 | Disposition: A | Discharge status: Home / Self Care | Payer: Medicare Other | Source: Ambulatory Visit | Attending: Gastroenterology | Admitting: Gastroenterology

## 2017-03-29 ENCOUNTER — Encounter: Payer: Self-pay | Admitting: Gastroenterology

## 2017-03-29 VITALS — BP 126/65 | HR 83 | Ht 59.0 in | Wt 236.5 lb

## 2017-03-29 DIAGNOSIS — D649 Anemia, unspecified: Secondary | ICD-10-CM | POA: Diagnosis not present

## 2017-03-29 LAB — CBC WITH DIFFERENTIAL/PLATELET
Basophils Absolute: 0.1 10*3/uL (ref 0–0.1)
Basophils Relative: 1 %
Eosinophils Absolute: 0.1 10*3/uL (ref 0–0.7)
Eosinophils Relative: 1 %
HCT: 26.6 % — ABNORMAL LOW (ref 35.0–47.0)
Hemoglobin: 8 g/dL — ABNORMAL LOW (ref 12.0–16.0)
Lymphocytes Relative: 11 %
Lymphs Abs: 1.3 10*3/uL (ref 1.0–3.6)
MCH: 18.7 pg — ABNORMAL LOW (ref 26.0–34.0)
MCHC: 30 g/dL — ABNORMAL LOW (ref 32.0–36.0)
MCV: 62.6 fL — ABNORMAL LOW (ref 80.0–100.0)
Monocytes Absolute: 0.8 10*3/uL (ref 0.2–0.9)
Monocytes Relative: 7 %
Neutro Abs: 9.1 10*3/uL — ABNORMAL HIGH (ref 1.4–6.5)
Neutrophils Relative %: 80 %
Platelets: 371 10*3/uL (ref 150–440)
RBC: 4.25 MIL/uL (ref 3.80–5.20)
RDW: 27.9 % — ABNORMAL HIGH (ref 11.5–14.5)
WBC: 11.4 10*3/uL — ABNORMAL HIGH (ref 3.6–11.0)

## 2017-03-29 NOTE — Progress Notes (Signed)
Primary Care Physician: Patient, No Pcp Per  Primary Gastroenterologist:  Dr. Lucilla Lame  Chief Complaint  Patient presents with  . Hospitalization Follow-up    HPI: Cristina [name] is a 57 y.o. female here for follow-up after being in the hospital for what was thought to be an upper GI bleed due to her melena.  The patient had an upper endoscopy that did not show any source of GI bleeding.  The patient denies having any recent blood work done except for her hemoglobin A1c.  The patient denies any abdominal pain nausea vomiting fevers or chills.  She did have a colonoscopy in 2017 that showed 6 polyps.  She has had no further signs of any GI bleeding that she can pinpoint.  Current Outpatient Medications  Medication Sig Dispense Refill  . acetaminophen (TYLENOL) 500 MG tablet Take 500 mg by mouth every 8 (eight) hours as needed for mild pain or moderate pain.    Marland Kitchen albuterol (PROVENTIL HFA;VENTOLIN HFA) 108 (90 BASE) MCG/ACT inhaler Inhale 2 puffs into the lungs every 6 (six) hours as needed for wheezing or shortness of breath.    Marland Kitchen aspirin EC 81 MG tablet Take 81 mg by mouth daily.    . busPIRone (BUSPAR) 10 MG tablet Take 10 mg by mouth 3 (three) times daily.    . cetirizine (ZYRTEC) 10 MG tablet Take 10 mg by mouth daily.     . Cholecalciferol (VITAMIN D3) 50000 units TABS Take by mouth.    . clonazePAM (KLONOPIN) 0.5 MG tablet Take 1 tablet (0.5 mg total) by mouth 2 (two) times daily. 15 tablet 0  . diphenoxylate-atropine (LOMOTIL) 2.5-0.025 MG tablet Take 1 tablet by mouth daily as needed for diarrhea or loose stools.    . empagliflozin (JARDIANCE) 10 MG TABS tablet Take 10 mg by mouth daily.    . ferrous sulfate 325 (65 FE) MG EC tablet Take 1 tablet (325 mg total) by mouth 2 (two) times daily. 60 tablet 0  . Fluticasone-Salmeterol (ADVAIR) 250-50 MCG/DOSE AEPB Inhale 1 puff into the lungs 2 (two) times daily.    . furosemide (LASIX) 20 MG tablet Take 1 tablet (20 mg total) by mouth  2 (two) times daily. (Patient taking differently: Take 20 mg by mouth 3 (three) times a week. ) 60 tablet 0  . haloperidol decanoate (HALDOL DECANOATE) 100 MG/ML injection Inject 1 mL (100 mg total) into the muscle every 30 (thirty) days. 1 mL 0  . loperamide (IMODIUM A-D) 2 MG tablet Take 2 mg by mouth 4 (four) times daily as needed for diarrhea or loose stools.    . metFORMIN (GLUCOPHAGE-XR) 500 MG 24 hr tablet Take 1,000 mg by mouth 2 (two) times daily.     . metoprolol tartrate (LOPRESSOR) 25 MG tablet Take 0.5 tablets (12.5 mg total) by mouth 2 (two) times daily. (Patient taking differently: Take 50 mg by mouth 2 (two) times daily. )    . ondansetron (ZOFRAN) 4 MG tablet Take 4 mg by mouth every 6 (six) hours as needed for nausea or vomiting.    . pantoprazole (PROTONIX) 40 MG tablet Take 40 mg by mouth 2 (two) times daily.    . potassium chloride 20 MEQ TBCR Take 10 mEq by mouth daily. 30 tablet 0  . pregabalin (LYRICA) 100 MG capsule Take 100 mg by mouth 3 (three) times daily.    Marland Kitchen umeclidinium-vilanterol (ANORO ELLIPTA) 62.5-25 MCG/INH AEPB Inhale 1 puff into the lungs daily.    Marland Kitchen  venlafaxine XR (EFFEXOR-XR) 150 MG 24 hr capsule Take 225 mg by mouth daily with breakfast.    . venlafaxine XR (EFFEXOR-XR) 75 MG 24 hr capsule Take 75 mg by mouth daily with breakfast.     . atorvastatin (LIPITOR) 20 MG tablet Take 20 mg by mouth at bedtime.    . haloperidol (HALDOL) 5 MG tablet Take 1 tablet (5 mg total) by mouth at bedtime. (Patient not taking: Reported on 03/29/2017) 30 tablet 0  . insulin aspart (NOVOLOG) 100 UNIT/ML injection Inject 0-9 Units into the skin 3 (three) times daily with meals. CBG 70 - 120: 0 units CBG 121 - 150: 0 units CBG 151 - 200: 0 units CBG 201 - 250: 2 units CBG 251 - 300: 3 units CBG 301 - 350: 4 units CBG 351 - 400: 5 units (Patient not taking: Reported on 03/29/2017) 100 mL 1  . insulin detemir (LEVEMIR) 100 UNIT/ML injection Inject 0.35 mLs (35 Units total) into  the skin 2 (two) times daily. Pt uses 50 units in the morning and 52 units at bedtime. (Patient not taking: Reported on 03/29/2017) 100 mL 0  . menthol-cetylpyridinium (CEPACOL) 3 MG lozenge Take 1 lozenge by mouth as needed for sore throat.    . nitroGLYCERIN (NITROSTAT) 0.3 MG SL tablet Place 0.3 mg under the tongue every 5 (five) minutes as needed for chest pain.    Marland Kitchen senna-docusate (SENOKOT-S) 8.6-50 MG tablet Take 1 tablet by mouth at bedtime as needed for mild constipation. (Patient not taking: Reported on 03/29/2017)     No current facility-administered medications for this visit.     Allergies as of 03/29/2017 - Review Complete 03/29/2017  Allergen Reaction Noted  . Bee venom Anaphylaxis and Shortness Of Breath 12/05/2014  . Shellfish allergy Anaphylaxis and Shortness Of Breath 06/07/2015  . Shellfish-derived products Anaphylaxis and Shortness Of Breath 12/05/2014  . Codeine  08/01/2014  . Thorazine [chlorpromazine] Swelling 09/23/2014  . Tylenol with codeine #3 [acetaminophen-codeine] Other (See Comments) 12/05/2014  . Metronidazole Rash 06/26/2015    ROS:  General: Negative for anorexia, weight loss, fever, chills, fatigue, weakness. ENT: Negative for hoarseness, difficulty swallowing , nasal congestion. CV: Negative for chest pain, angina, palpitations, dyspnea on exertion, peripheral edema.  Respiratory: Negative for dyspnea at rest, dyspnea on exertion, cough, sputum, wheezing.  GI: See history of present illness. GU:  Negative for dysuria, hematuria, urinary incontinence, urinary frequency, nocturnal urination.  Endo: Negative for unusual weight change.    Physical Examination:   BP 126/65   Pulse 83   Ht 4\' 11"  (1.499 m)   Wt 236 lb 8 oz (107.3 kg)   BMI 47.77 kg/m   General: Well-nourished, well-developed in no acute distress.  Eyes: No icterus. Conjunctivae pink. Mouth: Oropharyngeal mucosa moist and pink , no lesions erythema or exudate. Lungs: Clear to  auscultation bilaterally. Non-labored. Heart: Regular rate and rhythm, no murmurs rubs or gallops.  Abdomen: Bowel sounds are normal, nontender, nondistended, no hepatosplenomegaly or masses, no abdominal bruits or hernia , no rebound or guarding.   Extremities: No lower extremity edema. No clubbing or deformities. Neuro: Alert and oriented x 3.  Grossly intact. Skin: Warm and dry, no jaundice.   Psych: Alert and cooperative, normal mood and affect.  Labs:    Imaging Studies: Ct Head Wo Contrast  Result Date: 03/05/2017 CLINICAL DATA:  Photophobia and headache. EXAM: CT HEAD WITHOUT CONTRAST TECHNIQUE: Contiguous axial images were obtained from the base of the skull through the vertex  without intravenous contrast. COMPARISON:  Head CT 10/28/2015 FINDINGS: Brain: No mass lesion, intraparenchymal hemorrhage or extra-axial collection. No evidence of acute cortical infarct. Brain parenchyma and CSF-containing spaces are normal for age. Vascular: No hyperdense vessel or unexpected calcification. Skull: Normal visualized skull base, calvarium and extracranial soft tissues. Sinuses/Orbits: No sinus fluid levels or advanced mucosal thickening. No mastoid effusion. Normal orbits. IMPRESSION: Normal head CT. Electronically Signed   By: Ulyses Jarred M.D.   On: 03/05/2017 22:26   Ct Abdomen Pelvis W Contrast  Result Date: 03/05/2017 CLINICAL DATA:  Headache right abdominal pain with nausea EXAM: CT ABDOMEN AND PELVIS WITH CONTRAST TECHNIQUE: Multidetector CT imaging of the abdomen and pelvis was performed using the standard protocol following bolus administration of intravenous contrast. CONTRAST:  127mL ISOVUE-300 IOPAMIDOL (ISOVUE-300) INJECTION 61% COMPARISON:  CT abdomen pelvis 08/01/2014 FINDINGS: Lower chest: Lung bases demonstrate no acute consolidation or effusion. Mild cardiomegaly. Hepatobiliary: No focal liver abnormality is seen. Status post cholecystectomy. No biliary dilatation. Pancreas:  Unremarkable. No pancreatic ductal dilatation or surrounding inflammatory changes. Spleen: Normal in size without focal abnormality. Adrenals/Urinary Tract: Adrenal glands are within normal limits. No hydronephrosis. Slight increased size of a probable slightly dense cyst in the upper pole of the right kidney. Stomach/Bowel: Stomach is enlarged. Appendix appears normal. No evidence of bowel wall thickening, distention, or inflammatory changes. Vascular/Lymphatic: Aortic atherosclerosis. No enlarged abdominal or pelvic lymph nodes. Reproductive: Uterus and bilateral adnexa are unremarkable. Other: Negative for free air or free fluid. Musculoskeletal: Degenerative changes of the spine. No acute or suspicious lesion IMPRESSION: No CT evidence for acute intra-abdominal or pelvic abnormality. Moderately enlarged stomach with debris and fluid. Electronically Signed   By: Donavan Foil M.D.   On: 03/05/2017 22:31    Assessment and Plan:   Cristina [name] is a 57 y.o. y/o female Who comes in after being the hospital for anemia.  The patient had an upper endoscopy that did not show any source of her anemia.  The patient does have a history of colon polyps with 6 polyps found at her last colonoscopy back in June 2017.  The patient will have her labs sent off for repeat hemoglobin.  If her blood count is still low she may be considered for repeat colonoscopy and may also need a capsule endoscopy if the colonoscopy is negative.  The patient has been explained the plan and agrees with it.    Lucilla Lame, MD. [facility]   Note: This dictation was prepared with Dragon dictation along with smaller phrase technology. Any transcriptional errors that result from this process are unintentional.

## 2017-04-01 ENCOUNTER — Telehealth: Payer: Self-pay

## 2017-04-01 ENCOUNTER — Other Ambulatory Visit: Payer: Self-pay

## 2017-04-01 DIAGNOSIS — D500 Iron deficiency anemia secondary to blood loss (chronic): Secondary | ICD-10-CM

## 2017-04-01 NOTE — Telephone Encounter (Signed)
Pt notified of lab results. Pt has been scheduled for a colonoscopy with Dr. Allen Norris at [facility] on 04/20/17. Instructions mailed to [facility]. Appt coordinator for Protection)

## 2017-04-01 NOTE — Telephone Encounter (Signed)
-----   Message from Lucilla Lame, MD sent at 04/01/2017  6:56 AM EST ----- The patient know that her blood count is still significantly low and she should be set up for colonoscopy due to her anemia and history of polyps.  The patient should also be taking iron supplementation.

## 2017-04-08 ENCOUNTER — Telehealth: Payer: Self-pay

## 2017-04-08 NOTE — Telephone Encounter (Signed)
Patients colonoscopy has been rescheduled to 03/08 from 03/05 due to family availability  As requested by Cecille Rubin at skilled nursing facility where patient lives.

## 2017-04-23 ENCOUNTER — Encounter
Admission: RE | Disposition: A | Discharge status: Home / Self Care | Payer: Self-pay | Source: Ambulatory Visit | Attending: Gastroenterology

## 2017-04-23 ENCOUNTER — Ambulatory Visit
Admission: RE | Admit: 2017-04-23 | Discharge: 2017-04-23 | Disposition: A | Discharge status: Home / Self Care | Payer: Medicare Other | Source: Ambulatory Visit | Attending: Gastroenterology | Admitting: Gastroenterology

## 2017-04-23 ENCOUNTER — Encounter: Payer: Self-pay | Admitting: *Deleted

## 2017-04-23 ENCOUNTER — Ambulatory Visit: Payer: Medicare Other | Admitting: Anesthesiology

## 2017-04-23 DIAGNOSIS — D649 Anemia, unspecified: Secondary | ICD-10-CM | POA: Diagnosis not present

## 2017-04-23 DIAGNOSIS — Z7951 Long term (current) use of inhaled steroids: Secondary | ICD-10-CM | POA: Diagnosis not present

## 2017-04-23 DIAGNOSIS — E1140 Type 2 diabetes mellitus with diabetic neuropathy, unspecified: Secondary | ICD-10-CM | POA: Insufficient documentation

## 2017-04-23 DIAGNOSIS — K219 Gastro-esophageal reflux disease without esophagitis: Secondary | ICD-10-CM | POA: Insufficient documentation

## 2017-04-23 DIAGNOSIS — Z79899 Other long term (current) drug therapy: Secondary | ICD-10-CM | POA: Insufficient documentation

## 2017-04-23 DIAGNOSIS — E669 Obesity, unspecified: Secondary | ICD-10-CM | POA: Insufficient documentation

## 2017-04-23 DIAGNOSIS — J449 Chronic obstructive pulmonary disease, unspecified: Secondary | ICD-10-CM | POA: Diagnosis not present

## 2017-04-23 DIAGNOSIS — Z7982 Long term (current) use of aspirin: Secondary | ICD-10-CM | POA: Insufficient documentation

## 2017-04-23 DIAGNOSIS — M5136 Other intervertebral disc degeneration, lumbar region: Secondary | ICD-10-CM | POA: Insufficient documentation

## 2017-04-23 DIAGNOSIS — D500 Iron deficiency anemia secondary to blood loss (chronic): Secondary | ICD-10-CM

## 2017-04-23 DIAGNOSIS — Z9049 Acquired absence of other specified parts of digestive tract: Secondary | ICD-10-CM | POA: Insufficient documentation

## 2017-04-23 DIAGNOSIS — F259 Schizoaffective disorder, unspecified: Secondary | ICD-10-CM | POA: Insufficient documentation

## 2017-04-23 DIAGNOSIS — D123 Benign neoplasm of transverse colon: Secondary | ICD-10-CM | POA: Diagnosis not present

## 2017-04-23 DIAGNOSIS — E785 Hyperlipidemia, unspecified: Secondary | ICD-10-CM | POA: Diagnosis not present

## 2017-04-23 DIAGNOSIS — D509 Iron deficiency anemia, unspecified: Secondary | ICD-10-CM

## 2017-04-23 DIAGNOSIS — G4730 Sleep apnea, unspecified: Secondary | ICD-10-CM | POA: Diagnosis not present

## 2017-04-23 DIAGNOSIS — Z6841 Body Mass Index (BMI) 40.0 and over, adult: Secondary | ICD-10-CM | POA: Diagnosis not present

## 2017-04-23 DIAGNOSIS — D125 Benign neoplasm of sigmoid colon: Secondary | ICD-10-CM

## 2017-04-23 DIAGNOSIS — D122 Benign neoplasm of ascending colon: Secondary | ICD-10-CM | POA: Diagnosis not present

## 2017-04-23 DIAGNOSIS — Z801 Family history of malignant neoplasm of trachea, bronchus and lung: Secondary | ICD-10-CM | POA: Insufficient documentation

## 2017-04-23 DIAGNOSIS — Z8249 Family history of ischemic heart disease and other diseases of the circulatory system: Secondary | ICD-10-CM | POA: Insufficient documentation

## 2017-04-23 DIAGNOSIS — G5600 Carpal tunnel syndrome, unspecified upper limb: Secondary | ICD-10-CM | POA: Diagnosis not present

## 2017-04-23 DIAGNOSIS — I110 Hypertensive heart disease with heart failure: Secondary | ICD-10-CM | POA: Diagnosis not present

## 2017-04-23 DIAGNOSIS — Z888 Allergy status to other drugs, medicaments and biological substances status: Secondary | ICD-10-CM | POA: Insufficient documentation

## 2017-04-23 DIAGNOSIS — Z91013 Allergy to seafood: Secondary | ICD-10-CM | POA: Insufficient documentation

## 2017-04-23 DIAGNOSIS — Z794 Long term (current) use of insulin: Secondary | ICD-10-CM | POA: Diagnosis not present

## 2017-04-23 DIAGNOSIS — I509 Heart failure, unspecified: Secondary | ICD-10-CM | POA: Diagnosis not present

## 2017-04-23 DIAGNOSIS — Z886 Allergy status to analgesic agent status: Secondary | ICD-10-CM | POA: Insufficient documentation

## 2017-04-23 DIAGNOSIS — Z8673 Personal history of transient ischemic attack (TIA), and cerebral infarction without residual deficits: Secondary | ICD-10-CM | POA: Diagnosis not present

## 2017-04-23 DIAGNOSIS — Z91030 Bee allergy status: Secondary | ICD-10-CM | POA: Insufficient documentation

## 2017-04-23 DIAGNOSIS — M1990 Unspecified osteoarthritis, unspecified site: Secondary | ICD-10-CM | POA: Insufficient documentation

## 2017-04-23 DIAGNOSIS — Z9851 Tubal ligation status: Secondary | ICD-10-CM | POA: Insufficient documentation

## 2017-04-23 DIAGNOSIS — D124 Benign neoplasm of descending colon: Secondary | ICD-10-CM | POA: Insufficient documentation

## 2017-04-23 DIAGNOSIS — F17210 Nicotine dependence, cigarettes, uncomplicated: Secondary | ICD-10-CM | POA: Insufficient documentation

## 2017-04-23 DIAGNOSIS — Z885 Allergy status to narcotic agent status: Secondary | ICD-10-CM | POA: Insufficient documentation

## 2017-04-23 HISTORY — PX: COLONOSCOPY WITH PROPOFOL: SHX5780

## 2017-04-23 LAB — GLUCOSE, CAPILLARY: Glucose-Capillary: 125 mg/dL — ABNORMAL HIGH (ref 65–99)

## 2017-04-23 LAB — IRON AND TIBC
Iron: 9 ug/dL — ABNORMAL LOW (ref 28–170)
Saturation Ratios: 2 % — ABNORMAL LOW (ref 10.4–31.8)
TIBC: 454 ug/dL — ABNORMAL HIGH (ref 250–450)
UIBC: 445 ug/dL

## 2017-04-23 LAB — FOLATE: Folate: 11.7 ng/mL (ref >5.9)

## 2017-04-23 LAB — FERRITIN: Ferritin: 3 ng/mL — ABNORMAL LOW (ref 11–307)

## 2017-04-23 LAB — VITAMIN B12: Vitamin B-12: 201 pg/mL (ref 180–914)

## 2017-04-23 SURGERY — COLONOSCOPY WITH PROPOFOL
Anesthesia: General

## 2017-04-23 MED ORDER — SODIUM CHLORIDE 0.9 % IV SOLN
INTRAVENOUS | Status: DC
  Administered 2017-04-23: 08:00:00 via INTRAVENOUS

## 2017-04-23 MED ORDER — MIDAZOLAM HCL 2 MG/2ML IJ SOLN
INTRAMUSCULAR | Status: DC | PRN
  Administered 2017-04-23: 1 mg via INTRAVENOUS

## 2017-04-23 MED ORDER — PROPOFOL 500 MG/50ML IV EMUL
INTRAVENOUS | Status: AC
  Filled 2017-04-23: qty 50

## 2017-04-23 MED ORDER — MIDAZOLAM HCL 2 MG/2ML IJ SOLN
INTRAMUSCULAR | Status: AC
  Filled 2017-04-23: qty 2

## 2017-04-23 MED ORDER — PROPOFOL 500 MG/50ML IV EMUL
INTRAVENOUS | Status: DC | PRN
  Administered 2017-04-23: 75 ug/kg/min via INTRAVENOUS

## 2017-04-23 MED ORDER — PROPOFOL 10 MG/ML IV BOLUS
INTRAVENOUS | Status: DC | PRN
  Administered 2017-04-23: 70 mg via INTRAVENOUS

## 2017-04-23 NOTE — Anesthesia Post-op Follow-up Note (Signed)
Anesthesia QCDR form completed.        

## 2017-04-23 NOTE — H&P (Signed)
Jonathon Bellows, MD [address] [place], [place], [place], [address] [id number], [place], [place], [place], [address] Phone: [phone]  Fax: [phone]  Primary Care Physician:  Patient, No Pcp Per   Pre-Procedure History & Physical: HPI:  Cristina [name] is a 57 y.o. female is here for an colonoscopy.   Past Medical History:  Diagnosis Date  . Abdominal hernia   . Arthritis   . Asthma   . Bladder dysfunction   . Carpal tunnel syndrome   . CHF (congestive heart failure) ([place])   . COPD (chronic obstructive pulmonary disease) ([place])   . DDD (degenerative disc disease), lumbar   . Diabetes mellitus without complication ([place])   . Dyspnea   . Edema   . Fatty tumor   . GERD (gastroesophageal reflux disease)   . H/O colonoscopy   . H/O tubal ligation    hx btl  . History of cholecystectomy    hx of cholecystectomy  . HLD (hyperlipidemia)   . Hypertension   . Muscle cramps   . Neuropathy   . Obesity   . Recurrent boils   . Schizoaffective disorder ([place])   . Sleep apnea   . Stroke ([place])   . UTI (lower urinary tract infection)   . Vaginitis     Past Surgical History:  Procedure Laterality Date  . CARDIAC CATHETERIZATION Left 09/24/2014   Procedure: Left Heart Cath and Coronary Angiography;  Surgeon: Dionisio David, MD;  Location: [facility];  Service: Cardiovascular;  Laterality: Left;  . CHOLECYSTECTOMY    . COLONOSCOPY WITH PROPOFOL N/A 08/12/2015   Procedure: COLONOSCOPY WITH PROPOFOL;  Surgeon: Lollie Sails, MD;  Location: [facility] ENDOSCOPY;  Service: Endoscopy;  Laterality: N/A;  . ESOPHAGOGASTRODUODENOSCOPY (EGD) WITH PROPOFOL N/A 08/12/2015   Procedure: ESOPHAGOGASTRODUODENOSCOPY (EGD) WITH PROPOFOL;  Surgeon: Lollie Sails, MD;  Location: [facility] ENDOSCOPY;  Service: Endoscopy;  Laterality: N/A;  . ESOPHAGOGASTRODUODENOSCOPY (EGD) WITH PROPOFOL N/A 03/06/2017   Procedure: ESOPHAGOGASTRODUODENOSCOPY (EGD) WITH PROPOFOL;  Surgeon: Lucilla Lame,  MD;  Location: [facility] ENDOSCOPY;  Service: Endoscopy;  Laterality: N/A;  . tubal ligation      Prior to Admission medications   Medication Sig Start Date End Date Taking? Authorizing Provider  aspirin EC 81 MG tablet Take 81 mg by mouth daily.   Yes [provider]  atorvastatin (LIPITOR) 20 MG tablet Take 20 mg by mouth at bedtime.   Yes [provider]  busPIRone (BUSPAR) 10 MG tablet Take 10 mg by mouth 3 (three) times daily.   Yes [provider]  Cholecalciferol (VITAMIN D3) 50000 units TABS Take by mouth.   Yes [provider]  empagliflozin (JARDIANCE) 10 MG TABS tablet Take 10 mg by mouth daily.   Yes [provider]  Fluticasone-Salmeterol (ADVAIR) 250-50 MCG/DOSE AEPB Inhale 1 puff into the lungs 2 (two) times daily.   Yes [provider]  metFORMIN (GLUCOPHAGE-XR) 500 MG 24 hr tablet Take 1,000 mg by mouth 2 (two) times daily.  05/23/15  Yes [provider]  metoprolol tartrate (LOPRESSOR) 25 MG tablet Take 0.5 tablets (12.5 mg total) by mouth 2 (two) times daily. Patient taking differently: Take 50 mg by mouth 2 (two) times daily.  07/18/16  Yes Gouru, Illene Silver, MD  pantoprazole (PROTONIX) 40 MG tablet Take 40 mg by mouth 2 (two) times daily.   Yes [provider]  potassium chloride 20 MEQ TBCR Take 10 mEq by mouth daily. 07/18/16  Yes Gouru,  Illene Silver, MD  pregabalin (LYRICA) 100 MG capsule Take 100 mg by mouth 3 (three) times daily.   Yes [provider]  umeclidinium-vilanterol (ANORO ELLIPTA) 62.5-25 MCG/INH AEPB Inhale 1 puff into the lungs daily.   Yes [provider]  venlafaxine XR (EFFEXOR-XR) 150 MG 24 hr capsule Take 225 mg by mouth daily with breakfast.   Yes [provider]  venlafaxine XR (EFFEXOR-XR) 75 MG 24 hr capsule Take 75 mg by mouth daily with breakfast.    Yes [provider]  acetaminophen (TYLENOL) 500 MG tablet Take 500 mg by mouth every 8 (eight) hours as needed  for mild pain or moderate pain.    [provider]  albuterol (PROVENTIL HFA;VENTOLIN HFA) 108 (90 BASE) MCG/ACT inhaler Inhale 2 puffs into the lungs every 6 (six) hours as needed for wheezing or shortness of breath.    [provider]  cetirizine (ZYRTEC) 10 MG tablet Take 10 mg by mouth daily.     [provider]  clonazePAM (KLONOPIN) 0.5 MG tablet Take 1 tablet (0.5 mg total) by mouth 2 (two) times daily. 07/18/16   Nicholes Mango, MD  diphenoxylate-atropine (LOMOTIL) 2.5-0.025 MG tablet Take 1 tablet by mouth daily as needed for diarrhea or loose stools.    [provider]  ferrous sulfate 325 (65 FE) MG EC tablet Take 1 tablet (325 mg total) by mouth 2 (two) times daily. 03/07/17 04/06/17  Hillary Bow, MD  furosemide (LASIX) 20 MG tablet Take 1 tablet (20 mg total) by mouth 2 (two) times daily. Patient taking differently: Take 20 mg by mouth 3 (three) times a week.  07/18/16   Nicholes Mango, MD  haloperidol (HALDOL) 5 MG tablet Take 1 tablet (5 mg total) by mouth at bedtime. Patient not taking: Reported on 03/29/2017 10/31/15   Bettey Costa, MD  haloperidol decanoate (HALDOL DECANOATE) 100 MG/ML injection Inject 1 mL (100 mg total) into the muscle every 30 (thirty) days. 08/02/15   Hildred Priest, MD  insulin aspart (NOVOLOG) 100 UNIT/ML injection Inject 0-9 Units into the skin 3 (three) times daily with meals. CBG 70 - 120: 0 units CBG 121 - 150: 0 units CBG 151 - 200: 0 units CBG 201 - 250: 2 units CBG 251 - 300: 3 units CBG 301 - 350: 4 units CBG 351 - 400: 5 units Patient not taking: Reported on 03/29/2017 07/18/16   Nicholes Mango, MD  insulin detemir (LEVEMIR) 100 UNIT/ML injection Inject 0.35 mLs (35 Units total) into the skin 2 (two) times daily. Pt uses 50 units in the morning and 52 units at bedtime. Patient not taking: Reported on 03/29/2017 07/18/16   Nicholes Mango, MD  loperamide (IMODIUM A-D) 2 MG tablet Take 2 mg by mouth 4 (four) times daily as  needed for diarrhea or loose stools.    [provider]  menthol-cetylpyridinium (CEPACOL) 3 MG lozenge Take 1 lozenge by mouth as needed for sore throat.    [provider]  nitroGLYCERIN (NITROSTAT) 0.3 MG SL tablet Place 0.3 mg under the tongue every 5 (five) minutes as needed for chest pain.    [provider]  ondansetron (ZOFRAN) 4 MG tablet Take 4 mg by mouth every 6 (six) hours as needed for nausea or vomiting.    [provider]  senna-docusate (SENOKOT-S) 8.6-50 MG tablet Take 1 tablet by mouth at bedtime as needed for mild constipation. Patient not taking: Reported on 03/29/2017 07/18/16   Nicholes Mango, MD    Allergies  as of 04/01/2017 - Review Complete 03/29/2017  Allergen Reaction Noted  . Bee venom Anaphylaxis and Shortness Of Breath 12/05/2014  . Shellfish allergy Anaphylaxis and Shortness Of Breath 06/07/2015  . Shellfish-derived products Anaphylaxis and Shortness Of Breath 12/05/2014  . Codeine  08/01/2014  . Thorazine [chlorpromazine] Swelling 09/23/2014  . Tylenol with codeine #3 [acetaminophen-codeine] Other (See Comments) 12/05/2014  . Metronidazole Rash 06/26/2015    Family History  Problem Relation Age of Onset  . CAD Mother   . Lung cancer Mother     Social History   Socioeconomic History  . Marital status: Single    Spouse name: Not on file  . Number of children: Not on file  . Years of education: Not on file  . Highest education level: Not on file  Social Needs  . Financial resource strain: Not on file  . Food insecurity - worry: Not on file  . Food insecurity - inability: Not on file  . Transportation needs - medical: Not on file  . Transportation needs - non-medical: Not on file  Occupational History  . Occupation: disabled  Tobacco Use  . Smoking status: Current Some Day Smoker    Packs/day: 0.25    Types: Cigarettes  . Smokeless tobacco: Never Used  Substance and Sexual Activity  . Alcohol use: No     Alcohol/week: 0.0 oz  . Drug use: No  . Sexual activity: Not on file  Other Topics Concern  . Not on file  Social History Narrative  . Not on file    Review of Systems: See HPI, otherwise negative ROS  Physical Exam: BP 135/64   Pulse 80   Temp 98.5 F (36.9 C) (Tympanic)   Resp 16   Ht 4\' 9"  (1.448 m)   Wt 204 lb (92.5 kg)   SpO2 97%   BMI 44.15 kg/m  General:   Alert,  pleasant and cooperative in NAD Head:  Normocephalic and atraumatic. Neck:  Supple; no masses or thyromegaly. Lungs:  Clear throughout to auscultation, normal respiratory effort.    Heart:  +S1, +S2, Regular rate and rhythm, No edema. Abdomen:  Soft, nontender and nondistended. Normal bowel sounds, without guarding, and without rebound.   Neurologic:  Alert and  oriented x4;  grossly normal neurologically.  Impression/Plan: Aubery Date is here for an colonoscopy to be performed for microcytic anemia  Risks, benefits, limitations, and alternatives regarding  colonoscopy have been reviewed with the patient.  Questions have been answered.  All parties agreeable.   Jonathon Bellows, MD  04/23/2017, 8:26 AM

## 2017-04-23 NOTE — Anesthesia Postprocedure Evaluation (Signed)
Anesthesia Post Note  Patient: Cristina [name]  Procedure(s) Performed: COLONOSCOPY WITH PROPOFOL (N/A )  Patient location during evaluation: Endoscopy Anesthesia Type: General Level of consciousness: awake and alert Pain management: pain level controlled Vital Signs Assessment: post-procedure vital signs reviewed and stable Respiratory status: spontaneous breathing, nonlabored ventilation, respiratory function stable and patient connected to nasal cannula oxygen Cardiovascular status: blood pressure returned to baseline and stable Postop Assessment: no apparent nausea or vomiting Anesthetic complications: no     Last Vitals:  Vitals:   04/23/17 0756 04/23/17 0916  BP: 135/64 124/62  Pulse: 80   Resp: 16   Temp: 36.9 C (!) 36.2 C  SpO2: 97%     Last Pain:  Vitals:   04/23/17 0916  TempSrc: Tympanic                 Martha Clan

## 2017-04-23 NOTE — Transfer of Care (Signed)
Immediate Anesthesia Transfer of Care Note  Patient: Cristina [name]  Procedure(s) Performed: COLONOSCOPY WITH PROPOFOL (N/A )  Patient Location: PACU, endoscopy unit  Anesthesia Type:General  Level of Consciousness: drowsy and patient cooperative  Airway & Oxygen Therapy: Patient Spontanous Breathing and Patient connected to nasal cannula oxygen  Post-op Assessment: Report given to RN and Post -op Vital signs reviewed and stable  Post vital signs: Reviewed and stable  Last Vitals:  Vitals:   04/23/17 0756 04/23/17 0916  BP: 135/64 124/62  Pulse: 80   Resp: 16   Temp: 36.9 C (!) 36.2 C  SpO2: 97%     Last Pain:  Vitals:   04/23/17 0916  TempSrc: Tympanic         Complications: No apparent anesthesia complications

## 2017-04-23 NOTE — Op Note (Signed)
[facility] Gastroenterology Patient Name: Cristina [name] Procedure Date: 04/23/2017 8:36 AM MRN: [id number] Account #: [id number] Date of Birth: 17-Aug-1960 Admit Type: Outpatient Age: 57 Room: [facility] ENDO ROOM 4 Gender: Female Note Status: Finalized Procedure:            Colonoscopy Indications:          Iron deficiency anemia secondary to chronic blood loss Providers:            Jonathon Bellows MD, MD Medicines:            Monitored Anesthesia Care Complications:        No immediate complications. Procedure:            Pre-Anesthesia Assessment:                       - Prior to the procedure, a History and Physical was                        performed, and patient medications, allergies and                        sensitivities were reviewed. The patient's tolerance of                        previous anesthesia was reviewed.                       - The risks and benefits of the procedure and the                        sedation options and risks were discussed with the                        patient. All questions were answered and informed                        consent was obtained.                       - ASA Grade Assessment: II - A patient with mild                        systemic disease.                       After obtaining informed consent, the colonoscope was                        passed under direct vision. Throughout the procedure,                        the patient's blood pressure, pulse, and oxygen                        saturations were monitored continuously. The                        Colonoscope was introduced through the anus and                        advanced to the the cecum, identified by the  appendiceal orifice, IC valve and transillumination.                        The colonoscopy was performed with ease. The patient                        tolerated the procedure well. The quality of the bowel   preparation was good. Findings:      The perianal and digital rectal examinations were normal.      Four sessile polyps were found in the descending colon and ascending       colon. The polyps were 6 to 8 mm in size. These polyps were removed with       a cold snare. Resection and retrieval were complete.      Three sessile polyps were found in the transverse colon. The polyps were       6 to 10 mm in size. These polyps were removed with a cold snare.       Resection and retrieval were complete.      A 6 mm polyp was found in the transverse colon. The polyp was sessile.       The polyp was removed with a cold snare. Resection and retrieval were       complete.      A 3 mm polyp was found in the transverse colon. The polyp was sessile.       The polyp was removed with a cold biopsy forceps. Resection and       retrieval were complete.      A 12 mm polyp was found in the sigmoid colon. The polyp was sessile. The       polyp was removed with a cold snare. Resection and retrieval were       complete. To prevent bleeding after the polypectomy, two hemostatic       clips were successfully placed. There was no bleeding during, or at the       end, of the procedure.      The exam was otherwise without abnormality on direct and retroflexion       views. Impression:           - Four 6 to 8 mm polyps in the descending colon and in                        the ascending colon, removed with a cold snare.                        Resected and retrieved.                       - Three 6 to 10 mm polyps in the transverse colon,                        removed with a cold snare. Resected and retrieved.                       - One 6 mm polyp in the transverse colon, removed with                        a cold snare. Resected and retrieved.                       -  One 3 mm polyp in the transverse colon, removed with                        a cold biopsy forceps. Resected and retrieved.                       -  One 12 mm polyp in the sigmoid colon, removed with a                        cold snare. Resected and retrieved. Clips were placed.                       - The examination was otherwise normal on direct and                        retroflexion views. Recommendation:       - Discharge patient to home (with escort).                       - Resume previous diet.                       - Continue present medications.                       - Await pathology results.                       - Repeat colonoscopy in 3 years for surveillance.                       - 1. Check iron studies, b12,folate ,ferritin-if iron                        deficient refer to hematology for IV iron                       2. Outpatient capsule study to evaluate small bowel                       3. F/u with Dr Allen Norris in GI clinic in 6 weeks Procedure Code(s):    --- Professional ---                       [id number], Colonoscopy, flexible; with removal of tumor(s),                        polyp(s), or other lesion(s) by snare technique                       52778, 21, Colonoscopy, flexible; with biopsy, single                        or multiple Diagnosis Code(s):    --- Professional ---                       D12.4, Benign neoplasm of descending colon                       D12.2, Benign neoplasm of ascending colon  D12.3, Benign neoplasm of transverse colon (hepatic                        flexure or splenic flexure)                       D12.5, Benign neoplasm of sigmoid colon                       D50.0, Iron deficiency anemia secondary to blood loss                        (chronic) CPT copyright 2016 American Medical Association. All rights reserved. The codes documented in this report are preliminary and upon coder review may  be revised to meet current compliance requirements. Jonathon Bellows, MD Jonathon Bellows MD, MD 04/23/2017 9:16:17 AM This report has been signed electronically. Number of Addenda: 0 Note  Initiated On: 04/23/2017 8:36 AM Scope Withdrawal Time: 0 hours 18 minutes 13 seconds  Total Procedure Duration: 0 hours 30 minutes 42 seconds       [facility]

## 2017-04-23 NOTE — Anesthesia Preprocedure Evaluation (Signed)
Anesthesia Evaluation  Patient identified by MRN, date of birth, ID band Patient awake    Reviewed: Allergy & Precautions, H&P , NPO status , Patient's Chart, lab work & pertinent test results  History of Anesthesia Complications Negative for: history of anesthetic complications  Airway Mallampati: III  TM Distance: <3 FB Neck ROM: limited    Dental  (+) Chipped, Edentulous Upper, Edentulous Lower   Pulmonary shortness of breath and with exertion, asthma , sleep apnea , COPD, Current Smoker,           Cardiovascular Exercise Tolerance: Poor hypertension, (-) angina+ CAD, + Past MI and +CHF       Neuro/Psych PSYCHIATRIC DISORDERS Bipolar Disorder Schizophrenia  Neuromuscular disease CVA, Residual Symptoms    GI/Hepatic Neg liver ROS, GERD  Medicated and Controlled,  Endo/Other  diabetes  Renal/GU negative Renal ROS  negative genitourinary   Musculoskeletal  (+) Arthritis ,   Abdominal   Peds  Hematology negative hematology ROS (+)   Anesthesia Other Findings Past Medical History: No date: Abdominal hernia No date: Arthritis No date: Asthma No date: Bladder dysfunction No date: Carpal tunnel syndrome No date: CHF (congestive heart failure) ([facility]) No date: COPD (chronic obstructive pulmonary disease) ([facility]) No date: DDD (degenerative disc disease), lumbar No date: Diabetes mellitus without complication ([facility]) No date: Dyspnea No date: Edema No date: Fatty tumor No date: GERD (gastroesophageal reflux disease) No date: H/O colonoscopy No date: H/O tubal ligation     Comment:  hx btl No date: History of cholecystectomy     Comment:  hx of cholecystectomy No date: HLD (hyperlipidemia) No date: Hypertension No date: Muscle cramps No date: Neuropathy No date: Obesity No date: Recurrent boils No date: Schizoaffective disorder ([facility]) No date: Sleep apnea No date: Stroke [facility]) No date: UTI (lower urinary tract  infection) No date: Vaginitis  Past Surgical History: 09/24/2014: CARDIAC CATHETERIZATION; Left     Comment:  Procedure: Left Heart Cath and Coronary Angiography;                Surgeon: Dionisio David, MD;  Location: [facility]               LAB;  Service: Cardiovascular;  Laterality: Left; No date: CHOLECYSTECTOMY 08/12/2015: COLONOSCOPY WITH PROPOFOL; N/A     Comment:  Procedure: COLONOSCOPY WITH PROPOFOL;  Surgeon: Lollie Sails, MD;  Location: [facility] ENDOSCOPY;  Service:               Endoscopy;  Laterality: N/A; 08/12/2015: ESOPHAGOGASTRODUODENOSCOPY (EGD) WITH PROPOFOL; N/A     Comment:  Procedure: ESOPHAGOGASTRODUODENOSCOPY (EGD) WITH               PROPOFOL;  Surgeon: Lollie Sails, MD;  Location:               [facility] ENDOSCOPY;  Service: Endoscopy;  Laterality: N/A; No date: tubal ligation  BMI    Body Mass Index:  48.47 kg/m      Reproductive/Obstetrics negative OB ROS                             Anesthesia Physical  Anesthesia Plan  ASA: III  Anesthesia Plan: General   Post-op Pain Management:    Induction: Intravenous  PONV Risk Score and Plan: Propofol infusion  Airway Management Planned: Natural Airway and Nasal Cannula  Additional Equipment:  Intra-op Plan:   Post-operative Plan:   Informed Consent: I have reviewed the patients History and Physical, chart, labs and discussed the procedure including the risks, benefits and alternatives for the proposed anesthesia with the patient or authorized representative who has indicated his/her understanding and acceptance.   Dental Advisory Given  Plan Discussed with: Anesthesiologist, CRNA and Surgeon  Anesthesia Plan Comments: (Patient consented for risks of anesthesia including but not limited to:  - adverse reactions to medications - risk of intubation if required - damage to teeth, lips or other oral mucosa - sore throat or hoarseness - Damage to heart,  brain, lungs or loss of life  Patient voiced understanding.)        Anesthesia Quick Evaluation

## 2017-04-26 ENCOUNTER — Encounter: Payer: Self-pay | Admitting: Gastroenterology

## 2017-04-26 LAB — SURGICAL PATHOLOGY

## 2017-04-29 ENCOUNTER — Other Ambulatory Visit: Payer: Self-pay

## 2017-04-29 DIAGNOSIS — E538 Deficiency of other specified B group vitamins: Secondary | ICD-10-CM

## 2017-04-29 DIAGNOSIS — D500 Iron deficiency anemia secondary to blood loss (chronic): Secondary | ICD-10-CM

## 2017-05-10 ENCOUNTER — Other Ambulatory Visit: Payer: Self-pay | Admitting: Hematology and Oncology

## 2017-05-10 ENCOUNTER — Inpatient Hospital Stay: Payer: Medicare Other | Attending: Hematology and Oncology | Admitting: Hematology and Oncology

## 2017-05-10 ENCOUNTER — Inpatient Hospital Stay: Payer: Medicare Other

## 2017-05-10 ENCOUNTER — Other Ambulatory Visit: Payer: Self-pay | Admitting: Urgent Care

## 2017-05-10 ENCOUNTER — Telehealth: Payer: Self-pay | Admitting: Hematology and Oncology

## 2017-05-10 ENCOUNTER — Telehealth: Payer: Self-pay

## 2017-05-10 ENCOUNTER — Encounter: Payer: Self-pay | Admitting: Hematology and Oncology

## 2017-05-10 ENCOUNTER — Other Ambulatory Visit: Payer: Self-pay

## 2017-05-10 VITALS — BP 102/68 | HR 75 | Temp 97.5°F | Resp 18 | Ht 59.84 in | Wt 232.7 lb

## 2017-05-10 DIAGNOSIS — Z8249 Family history of ischemic heart disease and other diseases of the circulatory system: Secondary | ICD-10-CM | POA: Diagnosis not present

## 2017-05-10 DIAGNOSIS — Z881 Allergy status to other antibiotic agents status: Secondary | ICD-10-CM | POA: Insufficient documentation

## 2017-05-10 DIAGNOSIS — E785 Hyperlipidemia, unspecified: Secondary | ICD-10-CM | POA: Diagnosis not present

## 2017-05-10 DIAGNOSIS — Z818 Family history of other mental and behavioral disorders: Secondary | ICD-10-CM | POA: Diagnosis not present

## 2017-05-10 DIAGNOSIS — Z8673 Personal history of transient ischemic attack (TIA), and cerebral infarction without residual deficits: Secondary | ICD-10-CM

## 2017-05-10 DIAGNOSIS — Z7982 Long term (current) use of aspirin: Secondary | ICD-10-CM

## 2017-05-10 DIAGNOSIS — Z886 Allergy status to analgesic agent status: Secondary | ICD-10-CM | POA: Insufficient documentation

## 2017-05-10 DIAGNOSIS — D509 Iron deficiency anemia, unspecified: Secondary | ICD-10-CM

## 2017-05-10 DIAGNOSIS — F17210 Nicotine dependence, cigarettes, uncomplicated: Secondary | ICD-10-CM | POA: Diagnosis not present

## 2017-05-10 DIAGNOSIS — Z885 Allergy status to narcotic agent status: Secondary | ICD-10-CM | POA: Diagnosis not present

## 2017-05-10 DIAGNOSIS — Z79899 Other long term (current) drug therapy: Secondary | ICD-10-CM | POA: Diagnosis not present

## 2017-05-10 DIAGNOSIS — I509 Heart failure, unspecified: Secondary | ICD-10-CM | POA: Insufficient documentation

## 2017-05-10 DIAGNOSIS — F259 Schizoaffective disorder, unspecified: Secondary | ICD-10-CM | POA: Insufficient documentation

## 2017-05-10 DIAGNOSIS — I110 Hypertensive heart disease with heart failure: Secondary | ICD-10-CM

## 2017-05-10 DIAGNOSIS — E1140 Type 2 diabetes mellitus with diabetic neuropathy, unspecified: Secondary | ICD-10-CM | POA: Diagnosis not present

## 2017-05-10 DIAGNOSIS — D649 Anemia, unspecified: Secondary | ICD-10-CM

## 2017-05-10 DIAGNOSIS — Z794 Long term (current) use of insulin: Secondary | ICD-10-CM | POA: Diagnosis not present

## 2017-05-10 DIAGNOSIS — E538 Deficiency of other specified B group vitamins: Secondary | ICD-10-CM | POA: Diagnosis not present

## 2017-05-10 DIAGNOSIS — J449 Chronic obstructive pulmonary disease, unspecified: Secondary | ICD-10-CM

## 2017-05-10 DIAGNOSIS — Z91013 Allergy to seafood: Secondary | ICD-10-CM | POA: Diagnosis not present

## 2017-05-10 LAB — URINALYSIS, COMPLETE (UACMP) WITH MICROSCOPIC
Bilirubin Urine: NEGATIVE
Glucose, UA: NEGATIVE mg/dL
Hgb urine dipstick: NEGATIVE
Ketones, ur: NEGATIVE mg/dL
Nitrite: NEGATIVE
Protein, ur: NEGATIVE mg/dL
RBC / HPF: NONE SEEN RBC/hpf (ref 0–5)
Specific Gravity, Urine: 1.006 (ref 1.005–1.030)
pH: 5 (ref 5.0–8.0)

## 2017-05-10 LAB — CBC WITH DIFFERENTIAL/PLATELET
Basophils Absolute: 0.1 10*3/uL (ref 0–0.1)
Basophils Relative: 1 %
Eosinophils Absolute: 0.1 10*3/uL (ref 0–0.7)
Eosinophils Relative: 1 %
HCT: 23.4 % — ABNORMAL LOW (ref 36.0–46.0)
Hemoglobin: 6.7 g/dL — CL (ref 12.0–15.0)
Lymphocytes Relative: 11 %
Lymphs Abs: 0.8 10*3/uL — ABNORMAL LOW (ref 1.0–3.6)
MCH: 18.3 pg — ABNORMAL LOW (ref 26.0–34.0)
MCHC: 28.8 g/dL — ABNORMAL LOW (ref 32.0–36.0)
MCV: 63.3 fL — ABNORMAL LOW (ref 80.0–100.0)
Monocytes Absolute: 0.6 10*3/uL (ref 0.2–0.9)
Monocytes Relative: 8 %
Neutro Abs: 6.1 10*3/uL (ref 1.4–6.5)
Neutrophils Relative %: 79 %
Platelets: 207 10*3/uL (ref 150–440)
RBC: 3.69 MIL/uL — ABNORMAL LOW (ref 3.80–5.20)
RDW: 20.8 % — ABNORMAL HIGH (ref 11.5–14.5)
WBC: 7.6 10*3/uL (ref 3.6–11.0)

## 2017-05-10 LAB — FERRITIN: Ferritin: 2 ng/mL — ABNORMAL LOW (ref 11–307)

## 2017-05-10 LAB — RETICULOCYTES
RBC.: 3.76 MIL/uL — ABNORMAL LOW (ref 3.80–5.20)
Retic Count, Absolute: 86.5 10*3/uL (ref 19.0–183.0)
Retic Ct Pct: 2.3 % (ref 0.4–3.1)

## 2017-05-10 LAB — PREPARE RBC (CROSSMATCH)

## 2017-05-10 LAB — SAMPLE TO BLOOD BANK

## 2017-05-10 MED ORDER — B-12 1000 MCG PO CAPS: 1000.0000 ug | ORAL_CAPSULE | Freq: Every day | ORAL | 0 refills | Status: DC

## 2017-05-10 NOTE — Progress Notes (Signed)
[facility] day:  05/10/2017  Chief Complaint: Asheley Hellberg is a 57 y.o. female with anemia who is referred in consultation by Dr. Vicente Males for assessment and management.  HPI:  The patient was admitted to [facility] from 03/05/2017 - 03/07/2017 with symptomatic anemia.  She presented with generalized weakness, abdominal pain, and melena.  Stool was hemoccult positive.  She received 2 units of PRBCs.  Hemoglobin improved from 5.5 to 7.8.  She was discharged on oral iron BID.   EGD on 03/06/2017 by Dr. Allen Norris revealed a normal esophagus, stomach, and duodenum.  Colonoscopy on 04/23/2017 by Dr. Vicente Males revealed four 6 to 8 mm polyps in the descending colon and in the ascending colon (tubular adenoma). There were three 6 to 10 mm polyps in the transverse colon (tubular adenoma).  There was one 6 mm polyp in the transverse colon (adenoma) and one 3 mm polyp (fecal material) in the transverse colon . There was one 12 mm polyp in the sigmoid colon (tubular adenoma).  Clips were placed.  CBC on 03/29/2017 revealed a hematocrit of 26.6, hemoglobin 8.0, and MCV 62.6.  Labs on 04/23/2017 revealed a ferritin of 3.  Iron saturation was 2% with a TIBC of 454 c/w iron deficiency.  Folate was 11.7.  B12 was 201.  Urinalysis on 03/05/2017 revealed no hemoglobin or RBCs.  Creatinine was 0.97 on 03/05/2017.  Symptomatically, patient is doing "ok". She is tired and fatigued "all of the time".  She notes that her stools are "always black" due to her oral iron. Patient has questionable vaginal bleeding. She is post-menopausal. She states, "I see blood when I wipe. It is coming from the front". Patient has intermittent epistaxis episodes from her RIGHT nare only. She denies gingival irritation and bleeding.   Patient eats well. She notes that she doesn't eat meat every day. She states, "We may have it everyday, but I leave it on my plate or give it away". Patient eats green leafy vegetables on  a regular basis. She has ice pica. Patient states, "I love to chomp on ice". Patient has restless leg symptoms. Patient states, "I have known that I was anemic since I was a child".  Patient requires assistance with her activities of daily living. She needs assistance dressing, bathing, and toileting. She can feed herself. Patient spends the majority of her day "resting".   Her sister has a "blood disorder" and requires "blood and iron infusions". Sister also reported to have "female cancer".    Past Medical History:  Diagnosis Date  . Abdominal hernia   . Arthritis   . Asthma   . Bladder dysfunction   . Carpal tunnel syndrome   . CHF (congestive heart failure) ([place])   . COPD (chronic obstructive pulmonary disease) ([place])   . DDD (degenerative disc disease), lumbar   . Diabetes mellitus without complication ([place])   . Dyspnea   . Edema   . Fatty tumor   . GERD (gastroesophageal reflux disease)   . H/O colonoscopy   . H/O tubal ligation    hx btl  . History of cholecystectomy    hx of cholecystectomy  . HLD (hyperlipidemia)   . Hypertension   . Muscle cramps   . Neuropathy   . Obesity   . Recurrent boils   . Schizoaffective disorder ([place])   . Sleep apnea   . Stroke ([place])   . UTI (lower urinary tract infection)   . Vaginitis  Past Surgical History:  Procedure Laterality Date  . CARDIAC CATHETERIZATION Left 09/24/2014   Procedure: Left Heart Cath and Coronary Angiography;  Surgeon: Dionisio David, MD;  Location: [facility];  Service: Cardiovascular;  Laterality: Left;  . CHOLECYSTECTOMY    . COLONOSCOPY WITH PROPOFOL N/A 08/12/2015   Procedure: COLONOSCOPY WITH PROPOFOL;  Surgeon: Lollie Sails, MD;  Location: [facility] ENDOSCOPY;  Service: Endoscopy;  Laterality: N/A;  . COLONOSCOPY WITH PROPOFOL N/A 04/23/2017   Procedure: COLONOSCOPY WITH PROPOFOL;  Surgeon: Jonathon Bellows, MD;  Location: [facility] ENDOSCOPY;  Service: Endoscopy;  Laterality: N/A;  .  ESOPHAGOGASTRODUODENOSCOPY (EGD) WITH PROPOFOL N/A 08/12/2015   Procedure: ESOPHAGOGASTRODUODENOSCOPY (EGD) WITH PROPOFOL;  Surgeon: Lollie Sails, MD;  Location: [facility] ENDOSCOPY;  Service: Endoscopy;  Laterality: N/A;  . ESOPHAGOGASTRODUODENOSCOPY (EGD) WITH PROPOFOL N/A 03/06/2017   Procedure: ESOPHAGOGASTRODUODENOSCOPY (EGD) WITH PROPOFOL;  Surgeon: Lucilla Lame, MD;  Location: [facility] ENDOSCOPY;  Service: Endoscopy;  Laterality: N/A;  . tubal ligation      Family History  Problem Relation Age of Onset  . CAD Mother   . Lung cancer Mother   . Mental illness Mother   . Mental illness Father 104       suicided   . Mental illness Sister   . CAD Sister   . Mental illness Brother   . CAD Sister     Social History:  reports that she has been smoking cigarettes.  She has been smoking about 0.25 packs per day. She has quit using smokeless tobacco. Her smokeless tobacco use included chew. She reports that she does not drink alcohol or use drugs.  She smokes 1/3 pack/day for many years.  She quit 11 years ago, but recently started smoking again.  She stopped working in 1999 due to disability.  She is a resident of [facility] assisted living; moved there in October 2017.  The patient is accompanied by Mickel Baas, her caregiver at [place], today.  Allergies:  Allergies  Allergen Reactions  . Bee Venom Anaphylaxis and Shortness Of Breath    Stated by Patient  . Shellfish Allergy Anaphylaxis and Shortness Of Breath    Stated by Patient Stated by Patient   . Shellfish-Derived Products Anaphylaxis and Shortness Of Breath    Stated by Patient  . Codeine   . Thorazine [Chlorpromazine] Swelling  . Tylenol With Codeine #3 [Acetaminophen-Codeine] Other (See Comments)    Hallucinations  . Metronidazole Rash    Current Medications: Current Outpatient Medications  Medication Sig Dispense Refill  . albuterol (PROVENTIL HFA;VENTOLIN HFA) 108 (90 BASE) MCG/ACT inhaler Inhale 2 puffs into the lungs  every 6 (six) hours as needed for wheezing or shortness of breath.    Marland Kitchen acetaminophen (TYLENOL) 500 MG tablet Take 500 mg by mouth every 8 (eight) hours as needed for mild pain or moderate pain.    Marland Kitchen aspirin EC 81 MG tablet Take 81 mg by mouth daily.    Marland Kitchen atorvastatin (LIPITOR) 20 MG tablet Take 20 mg by mouth at bedtime.    . busPIRone (BUSPAR) 10 MG tablet Take 10 mg by mouth 3 (three) times daily.    . cetirizine (ZYRTEC) 10 MG tablet Take 10 mg by mouth daily.     . Cholecalciferol (VITAMIN D3) 50000 units TABS Take by mouth.    . clonazePAM (KLONOPIN) 0.5 MG tablet Take 1 tablet (0.5 mg total) by mouth 2 (two) times daily. 15 tablet 0  . diphenoxylate-atropine (LOMOTIL) 2.5-0.025 MG tablet Take 1 tablet by mouth  daily as needed for diarrhea or loose stools.    . empagliflozin (JARDIANCE) 10 MG TABS tablet Take 10 mg by mouth daily.    . ferrous sulfate 325 (65 FE) MG EC tablet Take 1 tablet (325 mg total) by mouth 2 (two) times daily. 60 tablet 0  . Fluticasone-Salmeterol (ADVAIR) 250-50 MCG/DOSE AEPB Inhale 1 puff into the lungs 2 (two) times daily.    . furosemide (LASIX) 20 MG tablet Take 1 tablet (20 mg total) by mouth 2 (two) times daily. (Patient taking differently: Take 20 mg by mouth 3 (three) times a week. ) 60 tablet 0  . haloperidol (HALDOL) 5 MG tablet Take 1 tablet (5 mg total) by mouth at bedtime. (Patient not taking: Reported on 03/29/2017) 30 tablet 0  . haloperidol decanoate (HALDOL DECANOATE) 100 MG/ML injection Inject 1 mL (100 mg total) into the muscle every 30 (thirty) days. 1 mL 0  . insulin aspart (NOVOLOG) 100 UNIT/ML injection Inject 0-9 Units into the skin 3 (three) times daily with meals. CBG 70 - 120: 0 units CBG 121 - 150: 0 units CBG 151 - 200: 0 units CBG 201 - 250: 2 units CBG 251 - 300: 3 units CBG 301 - 350: 4 units CBG 351 - 400: 5 units (Patient not taking: Reported on 03/29/2017) 100 mL 1  . insulin detemir (LEVEMIR) 100 UNIT/ML injection Inject 0.35 mLs  (35 Units total) into the skin 2 (two) times daily. Pt uses 50 units in the morning and 52 units at bedtime. (Patient not taking: Reported on 03/29/2017) 100 mL 0  . loperamide (IMODIUM A-D) 2 MG tablet Take 2 mg by mouth 4 (four) times daily as needed for diarrhea or loose stools.    . menthol-cetylpyridinium (CEPACOL) 3 MG lozenge Take 1 lozenge by mouth as needed for sore throat.    . metFORMIN (GLUCOPHAGE-XR) 500 MG 24 hr tablet Take 1,000 mg by mouth 2 (two) times daily.     . metoprolol tartrate (LOPRESSOR) 25 MG tablet Take 0.5 tablets (12.5 mg total) by mouth 2 (two) times daily. (Patient taking differently: Take 50 mg by mouth 2 (two) times daily. )    . nitroGLYCERIN (NITROSTAT) 0.3 MG SL tablet Place 0.3 mg under the tongue every 5 (five) minutes as needed for chest pain.    Marland Kitchen ondansetron (ZOFRAN) 4 MG tablet Take 4 mg by mouth every 6 (six) hours as needed for nausea or vomiting.    . pantoprazole (PROTONIX) 40 MG tablet Take 40 mg by mouth 2 (two) times daily.    . potassium chloride 20 MEQ TBCR Take 10 mEq by mouth daily. 30 tablet 0  . pregabalin (LYRICA) 100 MG capsule Take 100 mg by mouth 3 (three) times daily.    Marland Kitchen senna-docusate (SENOKOT-S) 8.6-50 MG tablet Take 1 tablet by mouth at bedtime as needed for mild constipation. (Patient not taking: Reported on 03/29/2017)    . umeclidinium-vilanterol (ANORO ELLIPTA) 62.5-25 MCG/INH AEPB Inhale 1 puff into the lungs daily.    Marland Kitchen venlafaxine XR (EFFEXOR-XR) 150 MG 24 hr capsule Take 225 mg by mouth daily with breakfast.    . venlafaxine XR (EFFEXOR-XR) 75 MG 24 hr capsule Take 75 mg by mouth daily with breakfast.      No current facility-administered medications for this visit.     Review of Systems:  GENERAL:  Feels fatigued.  Spends most of her day in bed "resting".  No fevers or sweats.  Weight up and down. PERFORMANCE STATUS (  ECOG):  3-4 HEENT:  Cataracts.  Rhinorrhea.  No sore throat, mouth sores or tenderness. Lungs: No shortness  of breath or cough.  No hemoptysis. Cardiac: Intermittent chest pain episodes; takes NTG PRN. No palpitations, orthopnea, or PND. GI:  IBS.  No nausea, vomiting, diarrhea, constipation, melena or hematochezia. GU:  Chronic malodorous urine. Bladder leak ("wears a pull-up").  No urgency, frequency, dysuria, or hematuria. Musculoskeletal:  No back pain.  No joint pain.  No muscle tenderness. Extremities:  Swelling and tender to touch bilaterally.  Hands "draw up". Skin:  No rashes or skin changes. Neuro:  No headache, numbness or weakness, balance or coordination issues. Endocrine:  Diabetes.  No thyroid issues, hot flashes or night sweats. Psych:  No mood changes, depression or anxiety. Pain:  No focal pain. Review of systems:  All other systems reviewed and found to be negative.  Physical Exam: Blood pressure 102/68, pulse 75, temperature (!) 97.5 F (36.4 C), temperature source Tympanic, resp. rate 18, height 4' 11.84" (1.52 m), weight 232 lb 11.2 oz (105.6 kg). GENERAL:  Well developed, well nourished, woman sitting comfortably in a wheelchair in the exam room in no acute distress. She is examined in the chair. MENTAL STATUS:  Alert and oriented to person, place and time. HEAD:  Short black hair.  Normocephalic, atraumatic, face symmetric, no Cushingoid features. EYES:  Blue eyes.  Pupils equal round and reactive to light and accomodation.  No conjunctivitis or scleral icterus. ENT:  Oropharynx clear without lesion.  Tongue normal.  Dentures.  Mucous membranes moist.  RESPIRATORY:  Clear to auscultation without rales, wheezes or rhonchi. CARDIOVASCULAR:  Regular rate and rhythm without murmur, rub or gallop. ABDOMEN:  Fully round.  Soft, non-tender, with active bowel sounds, and no hepatosplenomegaly.  No masses. SKIN:  SQ nodularity associated with abdominal bruising s/p insulin injections.  No rashes, ulcers or lesions. EXTREMITIES: No edema, no skin discoloration or tenderness.  No  palpable cords. LYMPH NODES: No palpable cervical, supraclavicular, axillary or inguinal adenopathy  NEUROLOGICAL: Unremarkable. PSYCH:  Appropriate.   No visits with results within 3 Day(s) from this visit.  Latest known visit with results is:  Admission on 04/23/2017, Discharged on 04/23/2017  Component Date Value Ref Range Status  . Glucose-Capillary 04/23/2017 125* 65 - 99 mg/dL Final  . Comment 1 04/23/2017 Document in Chart   Final  . SURGICAL PATHOLOGY 04/23/2017    Final                   Value:Surgical Pathology CASE: [id number] PATIENT: Alka Porte Surgical Pathology Report     SPECIMEN SUBMITTED: A. Colon polyp, transverse; cbx B. Colon polyp, transverse; cold snare C. Colon polyp x2, ascending; cold snare D. Colon polyp x3, transverse; cold snare E. Colon polyp x2, descending; cold snare F. Colon polyp, sigmoid; cold snare  CLINICAL HISTORY: None provided  PRE-OPERATIVE DIAGNOSIS: Iron deficiency anemia D50.9  POST-OPERATIVE DIAGNOSIS: Colon polyp     DIAGNOSIS: A.  COLON POLYP, TRANSVERSE; COLD BIOPSY: - TUBULAR ADENOMA. - NEGATIVE FOR HIGH-GRADE DYSPLASIA AND MALIGNANCY.  B.  COLON POLYP, TRANSVERSE; COLD SNARE: - FECAL MATERIAL. - NEGATIVE FOR COLONIC MUCOSA.  C.  COLON POLYP 2, ASCENDING; COLD SNARE: - TUBULAR ADENOMAS (2). - NEGATIVE FOR HIGH-GRADE DYSPLASIA AND MALIGNANCY.  D.  COLON POLYP 3, TRANSVERSE; COLD SNARE: - TUBULAR ADENOMAS (3). - NEGATIVE FOR HIGH-GRADE DYSPLASIA AND MALIGNANCY.  E.  COLON POLYP 2, DESC  ENDING; COLD SNARE: - TUBULAR ADENOMA. - NEGATIVE FOR HIGH-GRADE DYSPLASIA AND MALIGNANCY.  F.  COLON POLYP, SIGMOID; COLD SNARE: - TUBULAR ADENOMA. - NEGATIVE FOR HIGH-GRADE DYSPLASIA AND MALIGNANCY.   GROSS DESCRIPTION:  A. Labeled: C BX transverse colon polyp  Tissue fragment(s): 1  Size: 0.2 cm  Description: in formalin, tan fragment  Entirely submitted in 1 cassette(s).  B.  Labeled: cold snare transverse colon polyp  Tissue fragment(s): multiple  Size: aggregate, 2.0 x 2.0 x 0.1 cm  Description: in formalin, green-mucoid material and pink tissue fragment, marked blue  Entirely submitted in 1 cassette(s).  C. Labeled: cold snare ascending colon polyp 2  Tissue fragment(s): 2  Size: 0.5 and 0.8 cm  Description: in formalin, tan tissue fragments, marked blue  Entirely submitted in 1 cassette(s).  D. Labeled: cold snare transverse colon polyp 3  Tissue fragment(s): multiple  Size: aggregate, 2.5 x 2.0 x 0.1 cm  Description: in formalin                         , yellow fecal and mucoid material and tissue fragments  Entirely submitted in 1 cassette(s).  E. Labeled: cold snare descending colon polyp 2  Tissue fragment(s): multiple  Size: aggregate, 2.0 x 1.5 x 0.1 cm  Description: in formalin, yellow fecal and mucoid material and pink tissue fragments  Entirely submitted in one cassette(s).  F. Labeled: cold snare sigmoid colon polyp  Tissue fragment(s): 2  Size: 0.4 and 0.9 cm  Description:  in formalin, pink-red fragments, largest inked blue and bisected  Entirely submitted in 1 cassette(s).    Final Diagnosis performed by Delorse Lek, MD.  Electronically signed 04/26/2017 1:39:48PM    The electronic signature indicates that the named Attending Pathologist has evaluated the specimen  Technical component performed at [facility], [address], [place], [place] [address] Lab: [phone] Dir: Rush Farmer, MD, MMM  Professional component performed at [facility], [facility], [place], [place], [place] [address] Lab: [phone] Dir: Dellia Nims. Rubinas, MD    . Iron 04/23/2017 9* 28 - 170 ug/dL Final  . TIBC 04/23/2017 454* 250 - 450 ug/dL Final  . Saturation Ratios 04/23/2017 2* 10.4 - 31.8 % Final  . UIBC 04/23/2017 445  ug/dL Final   Performed at [facility], [address], [place], [place] [address]  . Ferritin 04/23/2017 3* 11 - 307 ng/mL Final   Performed at [facility], [address] [place], [place] [address]  . Folate 04/23/2017 11.7  >5.9 ng/mL Final   Performed at [facility], [address] [place], [place] [address]  . Vitamin B-12 04/23/2017 201  180 - 914 pg/mL Final   Comment: (NOTE) This assay is not validated for testing neonatal or myeloproliferative syndrome specimens for Vitamin B12 levels. Performed at [facility], [place] [address] [place], [place] [address]     Assessment:  Cristina [name] is a 57 y.o. female with iron deficiency anemia and B12 deficiency.  Diet appears good.  She has ice pica and restless legs.  She was admitted to [facility] from 03/05/2017 - 03/07/2017 with symptomatic anemia.  Stool was hemoccult positive.  She received 2 units of PRBCs.  Hemoglobin improved from 5.5 to 7.8.  She is on oral iron BID.   EGD  on 03/06/2017 revealed a normal esophagus, stomach, and duodenum.  Colonoscopy on 04/23/2017 revealed four 6 to 8 mm polyps in the descending colon and in the ascending colon (tubular adenoma). There were three 6 to 10 mm polyps in the transverse colon (tubular adenoma).  There was one 6 mm polyp in the transverse colon (adenoma) and one 3 mm polyp (fecal material) in the transverse colon . There was one 12 mm polyp in the sigmoid colon (tubular adenoma).  Clips were placed.  Labs on 04/23/2017 revealed a ferritin of 3.  Iron saturation was 2% with a TIBC of 454 c/w iron deficiency.  Folate was 11.7.  B12 was 201.  Urinalysis on 03/05/2017 revealed no hemoglobin or RBCs.  Creatinine was 0.97 on 03/05/2017.  Symptomatically,  she is tired and fatigued "all of the time".  Stools are "always black" on oral iron. She has questionable vaginal bleeding.   Plan:  1.  Discuss B12 and iron deficiency anemia. Ferritin previously 3. Patient taking oral iron. Will recheck values today.  Discuss need for weekly Venofer infusions x 3.  2.  Labs today:  CBC with diff, ferritin,  retic, intrinsic factor antibody, anti-parietal antibody, hold tube, UA. 3.  Preauth Venofer and B12. 4.  Discuss B12 deficiency. Patient offered injections. She states, "I take enough shots. I'm not taking them". Discuss possibility of pernicious anemia, which would necessitate IM injections. Patient finally agrees to IM supplementation. Schedule for weekly B12 injections x 6, then monthly.  5.  Discuss follow up with GI. Needs VCE.  6.  RTC in 1 week for MD assessment, review of workup, B12 injection, and +/- Venofer.  ADDENDUM: Patient at desk to schedule appointments. Mickel Baas (caregiver) stated to scheduler that she would not be bringing the patient over to the cancer center weekly for injections. Of note, patient was initially back and forth regarding oral vs parenteral B12 supplementation. Rx already sent in for B12 1000 mcg daily (Disp #90). Will plan on following up with patient at scheduled appointment regarding plans for B12.    Honor Loh, NP  05/10/2017, 12:19 PM   I saw and evaluated the patient, participating in the key portions of the service and reviewing pertinent diagnostic studies and records.  I reviewed the nurse practitioner's note and agree with the findings and the plan.  The assessment and plan were discussed with the patient.  Several questions were asked by the patient and answered.   Nolon Stalls, MD 05/10/2017, 12:19 PM

## 2017-05-10 NOTE — Telephone Encounter (Signed)
Per 05/10/17 LOS, I was only able to schedule the Lab addon for 05/10/17 and MD assessment/Lab Review/ +/- VENOFER and B12 Inj for 1 week, due to Lora/Patient's Caretaker & Patient stated multiple times not to schedule any additional B12 Inj or Venofer at this time, because she will not be bringing patient to any weekly or monthly appts.  Dr Mike Gip, Gaspar Bidding and Rodena Piety advised of both patient and caretaker decisions. Patient and caretaker was advised to inform Provider of their decision.  Staff msg also sent to Providers.

## 2017-05-10 NOTE — Progress Notes (Signed)
Here for new pt evaluation. Here from [facility] care home w caregiver Zacarias Pontes

## 2017-05-10 NOTE — Patient Instructions (Signed)
Iron Sucrose injection What is this medicine? IRON SUCROSE (AHY ern SOO krohs) is an iron complex. Iron is used to make healthy red blood cells, which carry oxygen and nutrients throughout the body. This medicine is used to treat iron deficiency anemia in people with chronic kidney disease. This medicine may be used for other purposes; ask your health care provider or pharmacist if you have questions. COMMON BRAND NAME(S): Venofer What should I tell my health care provider before I take this medicine? They need to know if you have any of these conditions: -anemia not caused by low iron levels -heart disease -high levels of iron in the blood -kidney disease -liver disease -an unusual or allergic reaction to iron, other medicines, foods, dyes, or preservatives -pregnant or trying to get pregnant -breast-feeding How should I use this medicine? This medicine is for infusion into a vein. It is given by a health care professional in a hospital or clinic setting. Talk to your pediatrician regarding the use of this medicine in children. While this drug may be prescribed for children as young as 2 years for selected conditions, precautions do apply. Overdosage: If you think you have taken too much of this medicine contact a poison control center or emergency room at once. NOTE: This medicine is only for you. Do not share this medicine with others. What if I miss a dose? It is important not to miss your dose. Call your doctor or health care professional if you are unable to keep an appointment. What may interact with this medicine? Do not take this medicine with any of the following medications: -deferoxamine -dimercaprol -other iron products This medicine may also interact with the following medications: -chloramphenicol -deferasirox This list may not describe all possible interactions. Give your health care provider a list of all the medicines, herbs, non-prescription drugs, or dietary  supplements you use. Also tell them if you smoke, drink alcohol, or use illegal drugs. Some items may interact with your medicine. What should I watch for while using this medicine? Visit your doctor or healthcare professional regularly. Tell your doctor or healthcare professional if your symptoms do not start to get better or if they get worse. You may need blood work done while you are taking this medicine. You may need to follow a special diet. Talk to your doctor. Foods that contain iron include: whole grains/cereals, dried fruits, beans, or peas, leafy green vegetables, and organ meats (liver, kidney). What side effects may I notice from receiving this medicine? Side effects that you should report to your doctor or health care professional as soon as possible: -allergic reactions like skin rash, itching or hives, swelling of the face, lips, or tongue -breathing problems -changes in blood pressure -cough -fast, irregular heartbeat -feeling faint or lightheaded, falls -fever or chills -flushing, sweating, or hot feelings -joint or muscle aches/pains -seizures -swelling of the ankles or feet -unusually weak or tired Side effects that usually do not require medical attention (report to your doctor or health care professional if they continue or are bothersome): -diarrhea -feeling achy -headache -irritation at site where injected -nausea, vomiting -stomach upset -tiredness This list may not describe all possible side effects. Call your doctor for medical advice about side effects. You may report side effects to FDA at 1-800-FDA-1088. Where should I keep my medicine? This drug is given in a hospital or clinic and will not be stored at home. NOTE: This sheet is a summary. It may not cover all possible information. If   you have questions about this medicine, talk to your doctor, pharmacist, or health care provider.  2018 Elsevier/Gold Standard (2010-11-13 17:14:35) Cyanocobalamin, Vitamin  B12 injection What is this medicine? CYANOCOBALAMIN (sye an oh koe BAL a min) is a man made form of vitamin B12. Vitamin B12 is used in the growth of healthy blood cells, nerve cells, and proteins in the body. It also helps with the metabolism of fats and carbohydrates. This medicine is used to treat people who can not absorb vitamin B12. This medicine may be used for other purposes; ask your health care provider or pharmacist if you have questions. COMMON BRAND NAME(S): B-12 Compliance Kit, B-12 Injection Kit, Cyomin, LA-12, Nutri-Twelve, Physicians EZ Use B-12, Primabalt What should I tell my health care provider before I take this medicine? They need to know if you have any of these conditions: -kidney disease -Leber's disease -megaloblastic anemia -an unusual or allergic reaction to cyanocobalamin, cobalt, other medicines, foods, dyes, or preservatives -pregnant or trying to get pregnant -breast-feeding How should I use this medicine? This medicine is injected into a muscle or deeply under the skin. It is usually given by a health care professional in a clinic or doctor's office. However, your doctor may teach you how to inject yourself. Follow all instructions. Talk to your pediatrician regarding the use of this medicine in children. Special care may be needed. Overdosage: If you think you have taken too much of this medicine contact a poison control center or emergency room at once. NOTE: This medicine is only for you. Do not share this medicine with others. What if I miss a dose? If you are given your dose at a clinic or doctor's office, call to reschedule your appointment. If you give your own injections and you miss a dose, take it as soon as you can. If it is almost time for your next dose, take only that dose. Do not take double or extra doses. What may interact with this medicine? -colchicine -heavy alcohol intake This list may not describe all possible interactions. Give your  health care provider a list of all the medicines, herbs, non-prescription drugs, or dietary supplements you use. Also tell them if you smoke, drink alcohol, or use illegal drugs. Some items may interact with your medicine. What should I watch for while using this medicine? Visit your doctor or health care professional regularly. You may need blood work done while you are taking this medicine. You may need to follow a special diet. Talk to your doctor. Limit your alcohol intake and avoid smoking to get the best benefit. What side effects may I notice from receiving this medicine? Side effects that you should report to your doctor or health care professional as soon as possible: -allergic reactions like skin rash, itching or hives, swelling of the face, lips, or tongue -blue tint to skin -chest tightness, pain -difficulty breathing, wheezing -dizziness -red, swollen painful area on the leg Side effects that usually do not require medical attention (report to your doctor or health care professional if they continue or are bothersome): -diarrhea -headache This list may not describe all possible side effects. Call your doctor for medical advice about side effects. You may report side effects to FDA at 1-800-FDA-1088. Where should I keep my medicine? Keep out of the reach of children. Store at room temperature between 15 and 30 degrees C (59 and 85 degrees F). Protect from light. Throw away any unused medicine after the expiration date. NOTE: This sheet is  a summary. It may not cover all possible information. If you have questions about this medicine, talk to your doctor, pharmacist, or health care provider.  2018 Elsevier/Gold Standard (2007-05-16 22:10:20)

## 2017-05-10 NOTE — Telephone Encounter (Signed)
Call to [facility]-and given Lora Powers/ transport woman to give her [facility]'s appt. Call to [facility] @ [phone]  Informed that Kareen has a 9 am appt on Tuesday 05/11/17 for blood transfusion. Zacarias Pontes initially said she had another appt and unsure how she can bring Kaylyne-then stated she will make arrangements to have her here for 9am blood transfusion. When told about WED 3/27 130 pm appt for [facility] to receive Venofer  She stated " We don't do WED appts ever -" refd to make an exception.to bring pt to [facility] for her treatment. Casey Burkitt RN informed  And will ask scheduling to reschedule  CAW

## 2017-05-11 ENCOUNTER — Inpatient Hospital Stay: Payer: Medicare Other

## 2017-05-11 DIAGNOSIS — D649 Anemia, unspecified: Secondary | ICD-10-CM

## 2017-05-11 DIAGNOSIS — D509 Iron deficiency anemia, unspecified: Secondary | ICD-10-CM | POA: Diagnosis not present

## 2017-05-11 LAB — INTRINSIC FACTOR ANTIBODIES: Intrinsic Factor: 1.1 AU/mL (ref 0.0–1.1)

## 2017-05-11 LAB — ANTI-PARIETAL ANTIBODY: Parietal Cell Antibody-IgG: 2 Units (ref 0.0–20.0)

## 2017-05-11 MED ORDER — ACETAMINOPHEN 325 MG PO TABS
650.0000 mg | ORAL_TABLET | Freq: Once | ORAL | Status: AC
  Administered 2017-05-11: 650 mg via ORAL
  Filled 2017-05-11: qty 2

## 2017-05-11 MED ORDER — DIPHENHYDRAMINE HCL 25 MG PO CAPS
25.0000 mg | ORAL_CAPSULE | Freq: Once | ORAL | Status: AC
  Administered 2017-05-11: 25 mg via ORAL
  Filled 2017-05-11: qty 1

## 2017-05-11 MED ORDER — SODIUM CHLORIDE 0.9 % IV SOLN
250.0000 mL | Freq: Once | INTRAVENOUS | Status: AC
  Administered 2017-05-11: 250 mL via INTRAVENOUS
  Filled 2017-05-11: qty 250

## 2017-05-12 ENCOUNTER — Inpatient Hospital Stay: Payer: Medicare Other

## 2017-05-12 LAB — TYPE AND SCREEN
ABO/RH(D): O POS
Antibody Screen: NEGATIVE
Unit division: 0

## 2017-05-12 LAB — BPAM RBC
Blood Product Expiration Date: 201904112359
ISSUE DATE / TIME: 201903260947
Unit Type and Rh: 5100

## 2017-05-13 ENCOUNTER — Other Ambulatory Visit: Payer: Self-pay

## 2017-05-13 DIAGNOSIS — D500 Iron deficiency anemia secondary to blood loss (chronic): Secondary | ICD-10-CM

## 2017-05-13 NOTE — Progress Notes (Signed)
amb  

## 2017-05-16 ENCOUNTER — Encounter: Payer: Self-pay | Admitting: Hematology and Oncology

## 2017-05-17 ENCOUNTER — Encounter: Payer: Self-pay | Admitting: Hematology and Oncology

## 2017-05-17 ENCOUNTER — Inpatient Hospital Stay: Payer: Medicare Other | Attending: Hematology and Oncology | Admitting: Hematology and Oncology

## 2017-05-17 ENCOUNTER — Inpatient Hospital Stay: Payer: Medicare Other

## 2017-05-17 VITALS — BP 136/79 | HR 71 | Temp 98.7°F | Resp 20 | Wt 227.2 lb

## 2017-05-17 VITALS — BP 125/77 | HR 70 | Resp 20

## 2017-05-17 DIAGNOSIS — Z885 Allergy status to narcotic agent status: Secondary | ICD-10-CM | POA: Diagnosis not present

## 2017-05-17 DIAGNOSIS — R5383 Other fatigue: Secondary | ICD-10-CM | POA: Diagnosis not present

## 2017-05-17 DIAGNOSIS — F5089 Other specified eating disorder: Secondary | ICD-10-CM | POA: Diagnosis not present

## 2017-05-17 DIAGNOSIS — R634 Abnormal weight loss: Secondary | ICD-10-CM | POA: Diagnosis not present

## 2017-05-17 DIAGNOSIS — Z79899 Other long term (current) drug therapy: Secondary | ICD-10-CM | POA: Diagnosis not present

## 2017-05-17 DIAGNOSIS — Z881 Allergy status to other antibiotic agents status: Secondary | ICD-10-CM | POA: Diagnosis not present

## 2017-05-17 DIAGNOSIS — J449 Chronic obstructive pulmonary disease, unspecified: Secondary | ICD-10-CM | POA: Diagnosis not present

## 2017-05-17 DIAGNOSIS — Z7189 Other specified counseling: Secondary | ICD-10-CM

## 2017-05-17 DIAGNOSIS — G2581 Restless legs syndrome: Secondary | ICD-10-CM | POA: Insufficient documentation

## 2017-05-17 DIAGNOSIS — R339 Retention of urine, unspecified: Secondary | ICD-10-CM

## 2017-05-17 DIAGNOSIS — E785 Hyperlipidemia, unspecified: Secondary | ICD-10-CM | POA: Diagnosis not present

## 2017-05-17 DIAGNOSIS — F17210 Nicotine dependence, cigarettes, uncomplicated: Secondary | ICD-10-CM | POA: Diagnosis not present

## 2017-05-17 DIAGNOSIS — Z7982 Long term (current) use of aspirin: Secondary | ICD-10-CM

## 2017-05-17 DIAGNOSIS — J3489 Other specified disorders of nose and nasal sinuses: Secondary | ICD-10-CM | POA: Diagnosis not present

## 2017-05-17 DIAGNOSIS — E538 Deficiency of other specified B group vitamins: Secondary | ICD-10-CM | POA: Diagnosis not present

## 2017-05-17 DIAGNOSIS — Z91013 Allergy to seafood: Secondary | ICD-10-CM | POA: Diagnosis not present

## 2017-05-17 DIAGNOSIS — F259 Schizoaffective disorder, unspecified: Secondary | ICD-10-CM | POA: Diagnosis not present

## 2017-05-17 DIAGNOSIS — D509 Iron deficiency anemia, unspecified: Secondary | ICD-10-CM

## 2017-05-17 DIAGNOSIS — I509 Heart failure, unspecified: Secondary | ICD-10-CM | POA: Diagnosis not present

## 2017-05-17 DIAGNOSIS — Z7984 Long term (current) use of oral hypoglycemic drugs: Secondary | ICD-10-CM | POA: Insufficient documentation

## 2017-05-17 DIAGNOSIS — Z8249 Family history of ischemic heart disease and other diseases of the circulatory system: Secondary | ICD-10-CM

## 2017-05-17 DIAGNOSIS — M1990 Unspecified osteoarthritis, unspecified site: Secondary | ICD-10-CM | POA: Insufficient documentation

## 2017-05-17 DIAGNOSIS — Z886 Allergy status to analgesic agent status: Secondary | ICD-10-CM | POA: Diagnosis not present

## 2017-05-17 DIAGNOSIS — E1140 Type 2 diabetes mellitus with diabetic neuropathy, unspecified: Secondary | ICD-10-CM | POA: Diagnosis not present

## 2017-05-17 DIAGNOSIS — Z8673 Personal history of transient ischemic attack (TIA), and cerebral infarction without residual deficits: Secondary | ICD-10-CM | POA: Insufficient documentation

## 2017-05-17 DIAGNOSIS — I110 Hypertensive heart disease with heart failure: Secondary | ICD-10-CM | POA: Insufficient documentation

## 2017-05-17 MED ORDER — CYANOCOBALAMIN 1000 MCG/ML IJ SOLN
1000.0000 ug | Freq: Once | INTRAMUSCULAR | Status: AC
  Administered 2017-05-17: 1000 ug via INTRAMUSCULAR
  Filled 2017-05-17: qty 1

## 2017-05-17 MED ORDER — SODIUM CHLORIDE 0.9 % IV SOLN
510.0000 mg | Freq: Once | INTRAVENOUS | Status: AC
  Administered 2017-05-17: 510 mg via INTRAVENOUS
  Filled 2017-05-17: qty 17

## 2017-05-17 MED ORDER — SODIUM CHLORIDE 0.9 % IV SOLN
Freq: Once | INTRAVENOUS | Status: AC
  Administered 2017-05-17: 13:00:00 via INTRAVENOUS
  Filled 2017-05-17: qty 1000

## 2017-05-17 NOTE — Progress Notes (Signed)
Patient states she has had diarrhea this morning X 3.  States she cannot tell any difference in how she feels after the iron infusions.  She does not want to come to clinic weekly - prefers to take oral iron instead.

## 2017-05-17 NOTE — Progress Notes (Signed)
[facility] day:  05/17/2017  Chief Complaint: Cristina [name] is a 57 y.o. female with anemia who is seen for review of work-up and discussion regarding direction of therapy.  HPI:  The patient was last seen in the hematology clinic on 05/10/2017 for initial consultation.  She had iron deficiency anemia and B12 deficiency.  We discussed Venofer and B12.  CBC revealed a hematocrit of 23.4, hemoglobin 6.7, MCV 63.3.  Ferritin was 2.  Retic was 2.3%.  Parietal cell antibody and intrinsic factor antibody were negative. Patient received 1 unit of PRBCs on 05/11/2017.  During the interim, patient has been "about the same". She continues to report fatigue.  Patient spends the majority of her day resting in her chair, or in the bed sleeping. Patient denies any obvious bleeding. Patient is taking oral B12 supplement as prescribed. Patient is eating well, however her diet is not that rich in iron. Patient's weight is down 5 pounds.   Patient is scheduled for a VCE on 05/25/2017. She is scheduled for a PSG on 05/27/2017.   Past Medical History:  Diagnosis Date  . Abdominal hernia   . Arthritis   . Asthma   . Bladder dysfunction   . Carpal tunnel syndrome   . CHF (congestive heart failure) ([place])   . COPD (chronic obstructive pulmonary disease) ([place])   . DDD (degenerative disc disease), lumbar   . Diabetes mellitus without complication ([place])   . Dyspnea   . Edema   . Fatty tumor   . GERD (gastroesophageal reflux disease)   . H/O colonoscopy   . H/O tubal ligation    hx btl  . History of cholecystectomy    hx of cholecystectomy  . HLD (hyperlipidemia)   . Hypertension   . Muscle cramps   . Neuropathy   . Obesity   . Recurrent boils   . Schizoaffective disorder ([place])   . Sleep apnea   . Stroke ([place])   . UTI (lower urinary tract infection)   . Vaginitis     Past Surgical History:  Procedure Laterality Date  . CARDIAC CATHETERIZATION Left  09/24/2014   Procedure: Left Heart Cath and Coronary Angiography;  Surgeon: Dionisio David, MD;  Location: [facility];  Service: Cardiovascular;  Laterality: Left;  . CHOLECYSTECTOMY    . COLONOSCOPY WITH PROPOFOL N/A 08/12/2015   Procedure: COLONOSCOPY WITH PROPOFOL;  Surgeon: Lollie Sails, MD;  Location: [facility] ENDOSCOPY;  Service: Endoscopy;  Laterality: N/A;  . COLONOSCOPY WITH PROPOFOL N/A 04/23/2017   Procedure: COLONOSCOPY WITH PROPOFOL;  Surgeon: Jonathon Bellows, MD;  Location: [facility] ENDOSCOPY;  Service: Endoscopy;  Laterality: N/A;  . ESOPHAGOGASTRODUODENOSCOPY (EGD) WITH PROPOFOL N/A 08/12/2015   Procedure: ESOPHAGOGASTRODUODENOSCOPY (EGD) WITH PROPOFOL;  Surgeon: Lollie Sails, MD;  Location: [facility] ENDOSCOPY;  Service: Endoscopy;  Laterality: N/A;  . ESOPHAGOGASTRODUODENOSCOPY (EGD) WITH PROPOFOL N/A 03/06/2017   Procedure: ESOPHAGOGASTRODUODENOSCOPY (EGD) WITH PROPOFOL;  Surgeon: Lucilla Lame, MD;  Location: [facility] ENDOSCOPY;  Service: Endoscopy;  Laterality: N/A;  . tubal ligation      Family History  Problem Relation Age of Onset  . CAD Mother   . Lung cancer Mother   . Mental illness Mother   . Mental illness Father 76       suicided   . Mental illness Sister   . CAD Sister   . Mental illness Brother   . CAD Sister     Social History:  reports that she  has been smoking cigarettes.  She has been smoking about 0.25 packs per day. She has quit using smokeless tobacco. Her smokeless tobacco use included chew. She reports that she does not drink alcohol or use drugs.  She smokes 1/3 pack/day for many years.  She quit 11 years ago, but recently started smoking again.  She stopped working in 1999 due to disability.  She is a resident of [facility] assisted [facility]; moved there in October 2017.  The patient is accompanied by Mickel Baas, her caregiver at [facility], today.  Allergies:  Allergies  Allergen Reactions  . Bee Venom Anaphylaxis and Shortness Of Breath    Stated by  Patient  . Shellfish Allergy Anaphylaxis and Shortness Of Breath    Stated by Patient Stated by Patient   . Shellfish-Derived Products Anaphylaxis and Shortness Of Breath    Stated by Patient  . Codeine   . Thorazine [Chlorpromazine] Swelling  . Tylenol With Codeine #3 [Acetaminophen-Codeine] Other (See Comments)    Hallucinations  . Metronidazole Rash    Current Medications: Current Outpatient Medications  Medication Sig Dispense Refill  . acetaminophen (TYLENOL) 500 MG tablet Take 500 mg by mouth every 8 (eight) hours as needed for mild pain or moderate pain.    Marland Kitchen albuterol (PROVENTIL HFA;VENTOLIN HFA) 108 (90 BASE) MCG/ACT inhaler Inhale 2 puffs into the lungs every 6 (six) hours as needed for wheezing or shortness of breath.    Marland Kitchen aspirin EC 81 MG tablet Take 81 mg by mouth daily.    Marland Kitchen atorvastatin (LIPITOR) 20 MG tablet Take 20 mg by mouth at bedtime.    . busPIRone (BUSPAR) 10 MG tablet Take 10 mg by mouth 2 (two) times daily.     . cetirizine (ZYRTEC) 10 MG tablet Take 10 mg by mouth daily.     . Cholecalciferol (VITAMIN D3) 50000 units TABS Take by mouth.     . clonazePAM (KLONOPIN) 0.5 MG tablet Take 1 tablet (0.5 mg total) by mouth 2 (two) times daily. (Patient taking differently: Take 0.5 mg by mouth daily. ) 15 tablet 0  . Cyanocobalamin (B-12) 1000 MCG CAPS Take 1,000 mcg by mouth daily. 90 capsule 0  . diphenoxylate-atropine (LOMOTIL) 2.5-0.025 MG tablet Take 1 tablet by mouth daily as needed for diarrhea or loose stools.    . empagliflozin (JARDIANCE) 10 MG TABS tablet Take 10 mg by mouth daily.    . empagliflozin (JARDIANCE) 10 MG TABS tablet Take by mouth.    . ferrous sulfate 325 (65 FE) MG EC tablet Take 1 tablet (325 mg total) by mouth 2 (two) times daily. 60 tablet 0  . Fluticasone-Salmeterol (ADVAIR) 250-50 MCG/DOSE AEPB Inhale 1 puff into the lungs 2 (two) times daily.    . furosemide (LASIX) 20 MG tablet Take 1 tablet (20 mg total) by mouth 2 (two) times daily.  (Patient taking differently: Take 20 mg by mouth daily. ) 60 tablet 0  . haloperidol (HALDOL) 5 MG tablet Take 1 tablet (5 mg total) by mouth at bedtime. (Patient taking differently: Take 5 mg by mouth 2 (two) times daily. ) 30 tablet 0  . haloperidol decanoate (HALDOL DECANOATE) 100 MG/ML injection Inject 1 mL (100 mg total) into the muscle every 30 (thirty) days. 1 mL 0  . Insulin Pen Needle (FIFTY50 PEN NEEDLES) 31G X 8 MM MISC PEN NEEDLES 31G X 6 MM MISC    . loperamide (IMODIUM A-D) 2 MG tablet Take 2 mg by mouth 4 (four) times daily as needed  for diarrhea or loose stools.    . menthol-cetylpyridinium (CEPACOL) 3 MG lozenge Take 1 lozenge by mouth as needed for sore throat.    . metFORMIN (GLUCOPHAGE-XR) 500 MG 24 hr tablet Take 1,000 mg by mouth 2 (two) times daily.     . metoprolol tartrate (LOPRESSOR) 25 MG tablet Take 0.5 tablets (12.5 mg total) by mouth 2 (two) times daily. (Patient taking differently: Take 50 mg by mouth 2 (two) times daily. )    . nitroGLYCERIN (NITROSTAT) 0.3 MG SL tablet Place 0.3 mg under the tongue every 5 (five) minutes as needed for chest pain.    Marland Kitchen ondansetron (ZOFRAN) 4 MG tablet Take 4 mg by mouth every 6 (six) hours as needed for nausea or vomiting.    . pantoprazole (PROTONIX) 40 MG tablet Take 40 mg by mouth 2 (two) times daily.    . potassium chloride 20 MEQ TBCR Take 10 mEq by mouth daily. 30 tablet 0  . pregabalin (LYRICA) 100 MG capsule Take 100 mg by mouth 3 (three) times daily.    Marland Kitchen umeclidinium-vilanterol (ANORO ELLIPTA) 62.5-25 MCG/INH AEPB Inhale 1 puff into the lungs daily.    Marland Kitchen venlafaxine XR (EFFEXOR-XR) 150 MG 24 hr capsule Take 225 mg by mouth daily with breakfast.    . venlafaxine XR (EFFEXOR-XR) 75 MG 24 hr capsule Take 75 mg by mouth daily with breakfast.     . Vitamin D, Ergocalciferol, (DRISDOL) 50000 units CAPS capsule Take by mouth.    . insulin detemir (LEVEMIR) 100 UNIT/ML injection Inject 0.35 mLs (35 Units total) into the skin 2 (two)  times daily. Pt uses 50 units in the morning and 52 units at bedtime. (Patient not taking: Reported on 03/29/2017) 100 mL 0  . senna-docusate (SENOKOT-S) 8.6-50 MG tablet Take 1 tablet by mouth at bedtime as needed for mild constipation. (Patient not taking: Reported on 03/29/2017)     No current facility-administered medications for this visit.     Review of Systems:  GENERAL:  Fatigued.  Spends most of her day in bed/chair "resting".  No fevers or sweats.  Weight up and down. Down 5 pounds since last visit. PERFORMANCE STATUS (ECOG):  3-4 HEENT:  Cataracts.  Rhinorrhea.  No sore throat, mouth sores or tenderness. Lungs: No shortness of breath or cough.  No hemoptysis. Cardiac: Intermittent chest pain episodes; takes NTG PRN. No palpitations, orthopnea, or PND. GI:  IBS.  No nausea, vomiting, diarrhea, constipation, melena or hematochezia. GU:  Chronic malodorous urine. Bladder leakage ("wears a pull-up").  No urgency, frequency, dysuria, or hematuria. Musculoskeletal:  No back pain.  No joint pain.  No muscle tenderness. Extremities:  Swelling and tender to touch bilaterally.  Hands "draw up". Skin:  No rashes or skin changes. Neuro:  No headache, numbness or weakness, balance or coordination issues. Endocrine:  Diabetes.  No thyroid issues, hot flashes or night sweats. Psych:  No mood changes, depression or anxiety. Pain:  No focal pain. Review of systems:  All other systems reviewed and found to be negative.  Physical Exam: Blood pressure 136/79, pulse 71, temperature 98.7 F (37.1 C), temperature source Tympanic, resp. rate 20, weight 227 lb 3 oz (103.1 kg), SpO2 96 %. GENERAL:  Well developed, well nourished, woman sitting comfortably in a wheelchair in the exam room in no acute distress. She is examined in the chair. MENTAL STATUS:  Alert and oriented to person, place and time. HEAD:  Short black hair.  Normocephalic, atraumatic, face symmetric, no Cushingoid  features. EYES:  Blue  eyes.  No conjunctivitis or scleral icterus.  RESPIRATORY:  Clear to auscultation without rales, wheezes or rhonchi. CARDIOVASCULAR:  Regular rate and rhythm without murmur, rub or gallop. ABDOMEN:  Fully round.  Soft, non-tender, with active bowel sounds, and no hepatosplenomegaly.  No masses. SKIN:  SQ nodularity associated with abdominal bruising s/p insulin injections.  No rashes, ulcers or lesions. EXTREMITIES: No edema, no skin discoloration or tenderness.  No palpable cords. NEUROLOGICAL: Unremarkable. PSYCH:  Appropriate.   No visits with results within 3 Day(s) from this visit.  Latest known visit with results is:  Orders Only on 05/10/2017  Component Date Value Ref Range Status  . Order Confirmation 05/10/2017    Final                   Value:ORDER PROCESSED BY BLOOD BANK Performed at [facility], [address] [place], [place] [address]   . ABO/RH(D) 05/10/2017 O POS   Final  . Antibody Screen 05/10/2017 NEG   Final  . Sample Expiration 05/10/2017 05/13/2017   Final  . Unit Number 05/10/2017 [id number]   Final  . Blood Component Type 05/10/2017 RED CELLS,LR   Final  . Unit division 05/10/2017 00   Final  . Status of Unit 05/10/2017 ISSUED,FINAL   Final  . Transfusion Status 05/10/2017 OK TO TRANSFUSE   Final  . Crossmatch Result 05/10/2017    Final                   Value:Compatible Performed at [facility], [address] [place], [place] [address]   . ISSUE DATE / TIME 05/10/2017 [id number]   Final  . Blood Product Unit Number 05/10/2017 [id number]   Final  . PRODUCT CODE 05/10/2017 [id number]   Final  . Unit Type and Rh 05/10/2017 5100   Final  . Blood Product Expiration Date 05/10/2017 [id number]   Final    Assessment:  Atleigh Gruen is a 57 y.o. female with iron deficiency anemia and B12 deficiency.  Diet appears good.  She has ice pica and restless legs.  She was admitted to [facility] from 03/05/2017 - 03/07/2017 with symptomatic  anemia.  Stool was hemoccult positive.  She received 2 units of PRBCs.  Hemoglobin improved from 5.5 to 7.8.  She is on oral iron BID.  She received 1 unit of PRBCs on 05/11/2017.  EGD on 03/06/2017 revealed a normal esophagus, stomach, and duodenum.  Colonoscopy on 04/23/2017 revealed four 6 to 8 mm polyps in the descending colon and in the ascending colon (tubular adenoma). There were three 6 to 10 mm polyps in the transverse colon (tubular adenoma).  There was one 6 mm polyp in the transverse colon (adenoma) and one 3 mm polyp (fecal material) in the transverse colon . There was one 12 mm polyp in the sigmoid colon (tubular adenoma).  Clips were placed.  Labs on 04/23/2017 revealed a ferritin of 3.  Iron saturation was 2% with a TIBC of 454 c/w iron deficiency.  Folate was 11.7.  B12 was 201.  Parietal cell antibody and intrinsic factor antibody were negative.  Urinalysis on 03/05/2017 revealed no hemoglobin or RBCs.  Creatinine was 0.97 on 03/05/2017.  Ferritin has been followed: 3 on 04/23/2017 and 2 on 05/10/2017.  Symptomatically,  she is tired and fatigued "all of the time".  Stools are "always black" on oral iron. Exam is stable.    Plan:  1.  Review work-up.  She  has B12 deficiency and iron deficiency anemia.  She is on oral B12.  Plan to check level in 1 month to assess response.  Discuss IV iron. Patient requests Feraheme rather than Venofer given 2 infusions with Feraheme versus several infusions with Venofer. 2.  Discuss B12 deficiency. Patient is taking oral B12 as prescribed.  3.  Discuss iron deficiency anemia. Ferritin was 2. Patient consents to Feraheme 510 mg x 2 doses.  4.  Scheduled for VCE on 05/25/2017. 5.  Scheduled for PSG on 05/27/2017. 6.  RTC in 1 month for MD assessment, labs (CBC with diff, ferritin, B12), and +/- Feraheme.   Honor Loh, NP  05/17/2017, 12:02 PM   I saw and evaluated the patient, participating in the key portions of the service and reviewing  pertinent diagnostic studies and records.  I reviewed the nurse practitioner's note and agree with the findings and the plan.  The assessment and plan were discussed with the patient.  Several questions were asked by the patient and answered.   Nolon Stalls, MD 05/17/2017, 12:02 PM

## 2017-05-24 ENCOUNTER — Encounter: Payer: Self-pay | Admitting: *Deleted

## 2017-05-24 ENCOUNTER — Inpatient Hospital Stay: Payer: Medicare Other

## 2017-05-24 VITALS — BP 120/70 | HR 70 | Temp 98.8°F | Resp 20

## 2017-05-24 DIAGNOSIS — D509 Iron deficiency anemia, unspecified: Secondary | ICD-10-CM | POA: Diagnosis not present

## 2017-05-24 MED ORDER — SODIUM CHLORIDE 0.9 % IV SOLN
510.0000 mg | Freq: Once | INTRAVENOUS | Status: AC
  Administered 2017-05-24: 510 mg via INTRAVENOUS
  Filled 2017-05-24: qty 17

## 2017-05-24 MED ORDER — SODIUM CHLORIDE 0.9 % IV SOLN
Freq: Once | INTRAVENOUS | Status: AC
  Administered 2017-05-24: 14:00:00 via INTRAVENOUS
  Filled 2017-05-24: qty 1000

## 2017-05-25 ENCOUNTER — Ambulatory Visit
Admission: RE | Admit: 2017-05-25 | Discharge: 2017-05-25 | Disposition: A | Discharge status: Home / Self Care | Payer: Medicare Other | Source: Ambulatory Visit | Attending: Gastroenterology | Admitting: Gastroenterology

## 2017-05-25 ENCOUNTER — Encounter: Payer: Self-pay | Admitting: Certified Registered Nurse Anesthetist

## 2017-05-25 ENCOUNTER — Encounter
Admission: RE | Disposition: A | Discharge status: Home / Self Care | Payer: Self-pay | Source: Ambulatory Visit | Attending: Gastroenterology

## 2017-05-25 DIAGNOSIS — K31819 Angiodysplasia of stomach and duodenum without bleeding: Secondary | ICD-10-CM | POA: Diagnosis not present

## 2017-05-25 DIAGNOSIS — R109 Unspecified abdominal pain: Secondary | ICD-10-CM | POA: Diagnosis not present

## 2017-05-25 DIAGNOSIS — D509 Iron deficiency anemia, unspecified: Secondary | ICD-10-CM | POA: Diagnosis present

## 2017-05-25 HISTORY — PX: GIVENS CAPSULE STUDY: SHX5432

## 2017-05-25 SURGERY — IMAGING PROCEDURE, GI TRACT, INTRALUMINAL, VIA CAPSULE

## 2017-05-27 ENCOUNTER — Encounter: Payer: Self-pay | Admitting: Gastroenterology

## 2017-05-27 ENCOUNTER — Other Ambulatory Visit: Payer: Self-pay

## 2017-05-27 ENCOUNTER — Ambulatory Visit: Payer: Medicare Other | Attending: Neurology

## 2017-05-27 DIAGNOSIS — J449 Chronic obstructive pulmonary disease, unspecified: Secondary | ICD-10-CM | POA: Insufficient documentation

## 2017-05-27 DIAGNOSIS — F329 Major depressive disorder, single episode, unspecified: Secondary | ICD-10-CM | POA: Diagnosis not present

## 2017-05-27 DIAGNOSIS — F5101 Primary insomnia: Secondary | ICD-10-CM | POA: Insufficient documentation

## 2017-05-27 DIAGNOSIS — Q2733 Arteriovenous malformation of digestive system vessel: Principal | ICD-10-CM

## 2017-05-27 DIAGNOSIS — K5520 Angiodysplasia of colon without hemorrhage: Secondary | ICD-10-CM

## 2017-05-27 DIAGNOSIS — Z6839 Body mass index (BMI) 39.0-39.9, adult: Secondary | ICD-10-CM | POA: Insufficient documentation

## 2017-05-27 DIAGNOSIS — I119 Hypertensive heart disease without heart failure: Secondary | ICD-10-CM | POA: Insufficient documentation

## 2017-05-27 DIAGNOSIS — E669 Obesity, unspecified: Secondary | ICD-10-CM | POA: Diagnosis not present

## 2017-05-27 DIAGNOSIS — R0683 Snoring: Secondary | ICD-10-CM | POA: Insufficient documentation

## 2017-05-27 DIAGNOSIS — G4733 Obstructive sleep apnea (adult) (pediatric): Secondary | ICD-10-CM | POA: Insufficient documentation

## 2017-05-28 ENCOUNTER — Ambulatory Visit: Payer: Medicare Other | Admitting: Anesthesiology

## 2017-05-28 ENCOUNTER — Encounter
Admission: RE | Disposition: A | Discharge status: Home / Self Care | Payer: Self-pay | Source: Ambulatory Visit | Attending: Gastroenterology

## 2017-05-28 ENCOUNTER — Ambulatory Visit
Admission: RE | Admit: 2017-05-28 | Discharge: 2017-05-28 | Disposition: A | Discharge status: Home / Self Care | Payer: Medicare Other | Source: Ambulatory Visit | Attending: Gastroenterology | Admitting: Gastroenterology

## 2017-05-28 ENCOUNTER — Other Ambulatory Visit: Payer: Self-pay

## 2017-05-28 ENCOUNTER — Encounter: Payer: Self-pay | Admitting: Anesthesiology

## 2017-05-28 ENCOUNTER — Encounter: Payer: Self-pay | Admitting: Gastroenterology

## 2017-05-28 DIAGNOSIS — Z794 Long term (current) use of insulin: Secondary | ICD-10-CM | POA: Diagnosis not present

## 2017-05-28 DIAGNOSIS — K921 Melena: Secondary | ICD-10-CM

## 2017-05-28 DIAGNOSIS — E785 Hyperlipidemia, unspecified: Secondary | ICD-10-CM | POA: Diagnosis not present

## 2017-05-28 DIAGNOSIS — E1140 Type 2 diabetes mellitus with diabetic neuropathy, unspecified: Secondary | ICD-10-CM | POA: Diagnosis not present

## 2017-05-28 DIAGNOSIS — Z79899 Other long term (current) drug therapy: Secondary | ICD-10-CM | POA: Diagnosis not present

## 2017-05-28 DIAGNOSIS — Q2733 Arteriovenous malformation of digestive system vessel: Secondary | ICD-10-CM

## 2017-05-28 DIAGNOSIS — E669 Obesity, unspecified: Secondary | ICD-10-CM | POA: Insufficient documentation

## 2017-05-28 DIAGNOSIS — Z6841 Body Mass Index (BMI) 40.0 and over, adult: Secondary | ICD-10-CM | POA: Insufficient documentation

## 2017-05-28 DIAGNOSIS — Z91013 Allergy to seafood: Secondary | ICD-10-CM | POA: Insufficient documentation

## 2017-05-28 DIAGNOSIS — Z91030 Bee allergy status: Secondary | ICD-10-CM | POA: Diagnosis not present

## 2017-05-28 DIAGNOSIS — K31819 Angiodysplasia of stomach and duodenum without bleeding: Secondary | ICD-10-CM | POA: Insufficient documentation

## 2017-05-28 DIAGNOSIS — I509 Heart failure, unspecified: Secondary | ICD-10-CM | POA: Insufficient documentation

## 2017-05-28 DIAGNOSIS — Z885 Allergy status to narcotic agent status: Secondary | ICD-10-CM | POA: Diagnosis not present

## 2017-05-28 DIAGNOSIS — Z7982 Long term (current) use of aspirin: Secondary | ICD-10-CM | POA: Diagnosis not present

## 2017-05-28 DIAGNOSIS — G4730 Sleep apnea, unspecified: Secondary | ICD-10-CM | POA: Diagnosis not present

## 2017-05-28 DIAGNOSIS — K922 Gastrointestinal hemorrhage, unspecified: Secondary | ICD-10-CM | POA: Diagnosis not present

## 2017-05-28 DIAGNOSIS — Z888 Allergy status to other drugs, medicaments and biological substances status: Secondary | ICD-10-CM | POA: Diagnosis not present

## 2017-05-28 DIAGNOSIS — I110 Hypertensive heart disease with heart failure: Secondary | ICD-10-CM | POA: Diagnosis not present

## 2017-05-28 DIAGNOSIS — K5520 Angiodysplasia of colon without hemorrhage: Secondary | ICD-10-CM

## 2017-05-28 DIAGNOSIS — J449 Chronic obstructive pulmonary disease, unspecified: Secondary | ICD-10-CM | POA: Diagnosis not present

## 2017-05-28 DIAGNOSIS — F17210 Nicotine dependence, cigarettes, uncomplicated: Secondary | ICD-10-CM | POA: Diagnosis not present

## 2017-05-28 HISTORY — PX: ENTEROSCOPY: SHX5533

## 2017-05-28 LAB — CBC
HCT: 32.1 % — ABNORMAL LOW (ref 35.0–47.0)
Hemoglobin: 9.8 g/dL — ABNORMAL LOW (ref 12.0–16.0)
MCH: 21.5 pg — ABNORMAL LOW (ref 26.0–34.0)
MCHC: 30.5 g/dL — ABNORMAL LOW (ref 32.0–36.0)
MCV: 70.4 fL — ABNORMAL LOW (ref 80.0–100.0)
Platelets: 242 10*3/uL (ref 150–440)
RBC: 4.56 MIL/uL (ref 3.80–5.20)
RDW: 30 % — ABNORMAL HIGH (ref 11.5–14.5)
WBC: 6.4 10*3/uL (ref 3.6–11.0)

## 2017-05-28 LAB — GLUCOSE, CAPILLARY: Glucose-Capillary: 127 mg/dL — ABNORMAL HIGH (ref 65–99)

## 2017-05-28 SURGERY — ENTEROSCOPY
Anesthesia: General

## 2017-05-28 MED ORDER — SODIUM CHLORIDE 0.9 % IV SOLN
INTRAVENOUS | Status: DC
  Administered 2017-05-28: 13:00:00 via INTRAVENOUS

## 2017-05-28 MED ORDER — PROPOFOL 500 MG/50ML IV EMUL
INTRAVENOUS | Status: AC
  Filled 2017-05-28: qty 50

## 2017-05-28 MED ORDER — PROPOFOL 500 MG/50ML IV EMUL
INTRAVENOUS | Status: DC | PRN
  Administered 2017-05-28: 100 ug/kg/min via INTRAVENOUS

## 2017-05-28 MED ORDER — PROPOFOL 10 MG/ML IV BOLUS
INTRAVENOUS | Status: DC | PRN
  Administered 2017-05-28: 50 mg via INTRAVENOUS

## 2017-05-28 NOTE — Op Note (Signed)
[facility] Gastroenterology Patient Name: Gray Maugeri Procedure Date: 05/28/2017 12:49 PM MRN: [id number] Account #: [id number] Date of Birth: Dec 25, 1960 Admit Type: Outpatient Age: 57 Room: [facility] ENDO ROOM 2 Gender: Female Note Status: Finalized Procedure:            Small bowel enteroscopy Providers:            Lin Landsman MD, MD Referring MD:         No Local Md, MD (Referring MD) Medicines:            Monitored Anesthesia Care Complications:        No immediate complications. Estimated blood loss: None. Procedure:            Pre-Anesthesia Assessment:                       - Prior to the procedure, a History and Physical was                        performed, and patient medications and allergies were                        reviewed. The patient is competent. The risks and                        benefits of the procedure and the sedation options and                        risks were discussed with the patient. All questions                        were answered and informed consent was obtained.                        Patient identification and proposed procedure were                        verified by the physician, the nurse, the                        anesthesiologist, the anesthetist and the technician in                        the pre-procedure area in the procedure room in the                        endoscopy suite. Mental Status Examination: alert and                        oriented. Airway Examination: normal oropharyngeal                        airway and neck mobility. Respiratory Examination:                        clear to auscultation. CV Examination: normal.                        Prophylactic Antibiotics: The patient does not require  prophylactic antibiotics. Prior Anticoagulants: The                        patient has taken no previous anticoagulant or                        antiplatelet agents. ASA Grade Assessment:  III - A                        patient with severe systemic disease. After reviewing                        the risks and benefits, the patient was deemed in                        satisfactory condition to undergo the procedure. The                        anesthesia plan was to use monitored anesthesia care                        (MAC). Immediately prior to administration of                        medications, the patient was re-assessed for adequacy                        to receive sedatives. The heart rate, respiratory rate,                        oxygen saturations, blood pressure, adequacy of                        pulmonary ventilation, and response to care were                        monitored throughout the procedure. The physical status                        of the patient was re-assessed after the procedure.                       After obtaining informed consent, the endoscope was                        passed under direct vision. Throughout the procedure,                        the patient's blood pressure, pulse, and oxygen                        saturations were monitored continuously. The                        Colonoscope was introduced through the mouth and                        advanced to the mid-jejunum. The small bowel  enteroscopy was accomplished without difficulty. The                        patient tolerated the procedure well. 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 Coagulation for hemostasis using argon plasma       was successful.      A single angiodysplastic lesion with no bleeding was found in the       mid-jejunum. Coagulation for hemostasis using argon plasma was       successful. Impression:           - Two non-bleeding angiodysplastic lesions in the                        duodenum. Treated with argon plasma coagulation (APC).                       - A  single non-bleeding angiodysplastic lesion in the                        jejunum. Treated with argon plasma coagulation (APC).                       - No specimens collected. Recommendation:       - Perform a retrograde small bowel enteroscopy (SBE) in                        1 week.                       - Discharge patient to a nursing home (with escort).                       - Resume previous diet today.                       - Continue present medications. Procedure Code(s):    --- Professional ---                       [phone], Small intestinal endoscopy, enteroscopy beyond                        second portion of duodenum, not including ileum; with                        control of bleeding (eg, injection, bipolar cautery,                        unipolar cautery, laser, heater probe, stapler, plasma                        coagulator) Diagnosis Code(s):    --- Professional ---                       U13.244, Angiodysplasia of stomach and duodenum without                        bleeding  K55.20, Angiodysplasia of colon without hemorrhage CPT copyright 2017 American Medical Association. All rights reserved. The codes documented in this report are preliminary and upon coder review may  be revised to meet current compliance requirements. Dr. Ulyess Mort Lin Landsman MD, MD 05/28/2017 1:53:51 PM This report has been signed electronically. Number of Addenda: 0 Note Initiated On: 05/28/2017 12:49 PM      [facility]

## 2017-05-28 NOTE — Transfer of Care (Signed)
Immediate Anesthesia Transfer of Care Note  Patient: Cristina [name]  Procedure(s) Performed: SMALL BOWEL ENTEROSCOPY (N/A )  Patient Location: PACU and Endoscopy Unit  Anesthesia Type:General  Level of Consciousness: awake and alert   Airway & Oxygen Therapy: Patient connected to nasal cannula oxygen  Post-op Assessment: Report given to RN and Post -op Vital signs reviewed and stable  Post vital signs: Reviewed and stable  Last Vitals:  Vitals Value Taken Time  BP 104/55 05/28/2017  1:49 PM  Temp 36.2 C 05/28/2017  1:49 PM  Pulse 71 05/28/2017  1:52 PM  Resp 13 05/28/2017  1:52 PM  SpO2 100 % 05/28/2017  1:52 PM  Vitals shown include unvalidated device data.  Last Pain:  Vitals:   05/28/17 1349  TempSrc: Tympanic  PainSc: Asleep         Complications: No apparent anesthesia complications

## 2017-05-28 NOTE — H&P (Signed)
Cephas Darby, MD [address]  [place]  [place], [place] [address]  Main: [phone]  Fax: [phone] Pager: [phone]  Primary Care Physician:  System, Pcp Not In Primary Gastroenterologist:  Dr. Cephas Darby  Pre-Procedure History & Physical: HPI:  Dezeray Puccio is a 57 y.o. female is here for a small bowel enteroscopy.   Past Medical History:  Diagnosis Date  . Abdominal hernia   . Arthritis   . Asthma   . Bladder dysfunction   . Carpal tunnel syndrome   . CHF (congestive heart failure) ([place])   . COPD (chronic obstructive pulmonary disease) ([place])   . DDD (degenerative disc disease), lumbar   . Diabetes mellitus without complication ([place])   . Dyspnea   . Edema   . Fatty tumor   . GERD (gastroesophageal reflux disease)   . H/O colonoscopy   . H/O tubal ligation    hx btl  . History of cholecystectomy    hx of cholecystectomy  . HLD (hyperlipidemia)   . Hypertension   . Muscle cramps   . Neuropathy   . Obesity   . Recurrent boils   . Schizoaffective disorder ([place])   . Sleep apnea   . Stroke ([place])   . UTI (lower urinary tract infection)   . Vaginitis     Past Surgical History:  Procedure Laterality Date  . CARDIAC CATHETERIZATION Left 09/24/2014   Procedure: Left Heart Cath and Coronary Angiography;  Surgeon: Dionisio David, MD;  Location: [place] CV LAB;  Service: Cardiovascular;  Laterality: Left;  . CHOLECYSTECTOMY    . COLONOSCOPY WITH PROPOFOL N/A 08/12/2015   Procedure: COLONOSCOPY WITH PROPOFOL;  Surgeon: Lollie Sails, MD;  Location: [facility] ENDOSCOPY;  Service: Endoscopy;  Laterality: N/A;  . COLONOSCOPY WITH PROPOFOL N/A 04/23/2017   Procedure: COLONOSCOPY WITH PROPOFOL;  Surgeon: Jonathon Bellows, MD;  Location: [facility] ENDOSCOPY;  Service: Endoscopy;  Laterality: N/A;  . ESOPHAGOGASTRODUODENOSCOPY (EGD) WITH PROPOFOL N/A 08/12/2015   Procedure: ESOPHAGOGASTRODUODENOSCOPY (EGD) WITH PROPOFOL;  Surgeon: Lollie Sails, MD;  [facility];  Service: Endoscopy;  Laterality: N/A;  . ESOPHAGOGASTRODUODENOSCOPY (EGD) WITH PROPOFOL N/A 03/06/2017   Procedure: ESOPHAGOGASTRODUODENOSCOPY (EGD) WITH PROPOFOL;  Surgeon: Lucilla Lame, MD;  Location: [facility] ENDOSCOPY;  Service: Endoscopy;  Laterality: N/A;  . GIVENS CAPSULE STUDY N/A 05/25/2017   Procedure: GIVENS CAPSULE STUDY;  Surgeon: Lucilla Lame, MD;  Location[facility] ENDOSCOPY;  Service: Endoscopy;  Laterality: N/A;  . tubal ligation      Prior to Admission medications   Medication Sig Start Date End Date Taking? Authorizing Provider  acetaminophen (TYLENOL) 500 MG tablet Take 500 mg by mouth every 8 (eight) hours as needed for mild pain or moderate pain.   Yes [provider]  albuterol (PROVENTIL HFA;VENTOLIN HFA) 108 (90 BASE) MCG/ACT inhaler Inhale 2 puffs into the lungs every 6 (six) hours as needed for wheezing or shortness of breath.   Yes [provider]  aspirin EC 81 MG tablet Take 81 mg by mouth daily.   Yes [provider]  atorvastatin (LIPITOR) 20 MG tablet Take 20 mg by mouth at bedtime.   Yes [provider]  busPIRone (BUSPAR) 10 MG tablet Take 10 mg by mouth 2 (two) times daily.    Yes [provider]  cetirizine (ZYRTEC) 10 MG tablet Take 10 mg by mouth daily.    Yes [provider]  Cholecalciferol (VITAMIN D3) 50000 units TABS Take by mouth.    Yes  [provider]  clonazePAM (KLONOPIN) 0.5 MG tablet Take 1 tablet (0.5 mg total) by mouth 2 (two) times daily. Patient taking differently: Take 0.5 mg by mouth daily.  07/18/16  Yes Gouru, Aruna, MD  Cyanocobalamin (B-12) 1000 MCG CAPS Take 1,000 mcg by mouth daily. 05/10/17  Yes Karen Kitchens, NP  diphenoxylate-atropine (LOMOTIL) 2.5-0.025 MG tablet Take 1 tablet by mouth daily as needed for diarrhea or loose stools.   Yes [provider]  empagliflozin (JARDIANCE) 10 MG TABS tablet Take 10 mg by mouth daily.   Yes [provider]    empagliflozin (JARDIANCE) 10 MG TABS tablet Take by mouth. 02/22/17  Yes [provider]  Fluticasone-Salmeterol (ADVAIR) 250-50 MCG/DOSE AEPB Inhale 1 puff into the lungs 2 (two) times daily.   Yes [provider]  furosemide (LASIX) 20 MG tablet Take 1 tablet (20 mg total) by mouth 2 (two) times daily. Patient taking differently: Take 20 mg by mouth daily.  07/18/16  Yes Gouru, Illene Silver, MD  haloperidol (HALDOL) 5 MG tablet Take 1 tablet (5 mg total) by mouth at bedtime. Patient taking differently: Take 5 mg by mouth 2 (two) times daily.  10/31/15  Yes Bettey Costa, MD  haloperidol decanoate (HALDOL DECANOATE) 100 MG/ML injection Inject 1 mL (100 mg total) into the muscle every 30 (thirty) days. 08/02/15  Yes Hildred Priest, MD  insulin detemir (LEVEMIR) 100 UNIT/ML injection Inject 0.35 mLs (35 Units total) into the skin 2 (two) times daily. Pt uses 50 units in the morning and 52 units at bedtime. 07/18/16  Yes Gouru, Aruna, MD  Insulin Pen Needle (FIFTY50 PEN NEEDLES) 31G X 8 MM MISC PEN NEEDLES 31G X 6 MM MISC 10/07/12  Yes [provider]  loperamide (IMODIUM A-D) 2 MG tablet Take 2 mg by mouth 4 (four) times daily as needed for diarrhea or loose stools.   Yes [provider]  menthol-cetylpyridinium (CEPACOL) 3 MG lozenge Take 1 lozenge by mouth as needed for sore throat.   Yes [provider]  metFORMIN (GLUCOPHAGE-XR) 500 MG 24 hr tablet Take 1,000 mg by mouth 2 (two) times daily.  05/23/15  Yes [provider]  metoprolol tartrate (LOPRESSOR) 25 MG tablet Take 0.5 tablets (12.5 mg total) by mouth 2 (two) times daily. Patient taking differently: Take 50 mg by mouth 2 (two) times daily.  07/18/16  Yes Gouru, Illene Silver, MD  nitroGLYCERIN (NITROSTAT) 0.3 MG SL tablet Place 0.3 mg under the tongue every 5 (five) minutes as needed for chest pain.   Yes [provider]  ondansetron (ZOFRAN) 4 MG tablet Take 4 mg by mouth every 6 (six) hours as  needed for nausea or vomiting.   Yes [provider]  pantoprazole (PROTONIX) 40 MG tablet Take 40 mg by mouth 2 (two) times daily.   Yes [provider]  potassium chloride 20 MEQ TBCR Take 10 mEq by mouth daily. 07/18/16  Yes Gouru, Illene Silver, MD  pregabalin (LYRICA) 100 MG capsule Take 100 mg by mouth 3 (three) times daily.   Yes [provider]  senna-docusate (SENOKOT-S) 8.6-50 MG tablet Take 1 tablet by mouth at bedtime as needed for mild constipation. 07/18/16  Yes Gouru, Aruna, MD  umeclidinium-vilanterol (ANORO ELLIPTA) 62.5-25 MCG/INH AEPB Inhale 1 puff into the lungs daily.   Yes [provider]  venlafaxine XR (EFFEXOR-XR) 150 MG 24 hr capsule Take 225 mg by mouth daily with breakfast.   Yes [provider]  Vitamin D, Ergocalciferol, (DRISDOL)  50000 units CAPS capsule Take by mouth.   Yes [provider]  ferrous sulfate 325 (65 FE) MG EC tablet Take 1 tablet (325 mg total) by mouth 2 (two) times daily. 03/07/17 05/17/17  Hillary Bow, MD  venlafaxine XR (EFFEXOR-XR) 75 MG 24 hr capsule Take 75 mg by mouth daily with breakfast.     [provider]    Allergies as of 05/27/2017 - Review Complete 05/24/2017  Allergen Reaction Noted  . Bee venom Anaphylaxis and Shortness Of Breath 12/05/2014  . Shellfish allergy Anaphylaxis and Shortness Of Breath 06/07/2015  . Shellfish-derived products Anaphylaxis and Shortness Of Breath 12/05/2014  . Codeine  08/01/2014  . Thorazine [chlorpromazine] Swelling 09/23/2014  . Tylenol with codeine #3 [acetaminophen-codeine] Other (See Comments) 12/05/2014  . Metronidazole Rash 06/26/2015    Family History  Problem Relation Age of Onset  . CAD Mother   . Lung cancer Mother   . Mental illness Mother   . Mental illness Father 62       suicided   . Mental illness Sister   . CAD Sister   . Mental illness Brother   . CAD Sister     Social History   Socioeconomic History  . Marital status:  Single    Spouse name: Not on file  . Number of children: Not on file  . Years of education: Not on file  . Highest education level: Not on file  Occupational History  . Occupation: disabled  Social Needs  . Financial resource strain: Not on file  . Food insecurity:    Worry: Not on file    Inability: Not on file  . Transportation needs:    Medical: Not on file    Non-medical: Not on file  Tobacco Use  . Smoking status: Current Some Day Smoker    Packs/day: 0.25    Types: Cigarettes  . Smokeless tobacco: Former Systems developer    Types: Chew  Substance and Sexual Activity  . Alcohol use: No    Alcohol/week: 0.0 oz  . Drug use: No  . Sexual activity: Not on file  Lifestyle  . Physical activity:    Days per week: Not on file    Minutes per session: Not on file  . Stress: Not on file  Relationships  . Social connections:    Talks on phone: Not on file    Gets together: Not on file    Attends religious service: Not on file    Active member of club or organization: Not on file    Attends meetings of clubs or organizations: Not on file    Relationship status: Not on file  . Intimate partner violence:    Fear of current or ex partner: Not on file    Emotionally abused: Not on file    Physically abused: Not on file    Forced sexual activity: Not on file  Other Topics Concern  . Not on file  Social History Narrative  . Not on file    Review of Systems: See HPI, otherwise negative ROS  Physical Exam: BP 128/63   Pulse 70   Temp (!) 97.2 F (36.2 C) (Tympanic)   Resp 20   Ht 4\' 11"  (1.499 m)   Wt 227 lb (103 kg)   SpO2 98%   BMI 45.85 kg/m  General:   Alert,  pleasant and cooperative in NAD Head:  Normocephalic and atraumatic. Neck:  Supple; no masses or thyromegaly. Lungs:  Clear throughout to auscultation.  Heart:  Regular rate and rhythm. Abdomen:  Soft, nontender and nondistended. Normal bowel sounds, without guarding, and without rebound.   Neurologic:  Alert  and  oriented x4;  grossly normal neurologically.  Impression/Plan: Treasure Ochs is here for a small bowel enteroscopy to be performed for overt obscure GI bleed, positive VCE, small bowel bleed  Risks, benefits, limitations, and alternatives regarding push enteroscopy have been reviewed with the patient.  Questions have been answered.  All parties agreeable.   Sherri Sear, MD  05/28/2017, 12:39 PM

## 2017-05-28 NOTE — Anesthesia Preprocedure Evaluation (Signed)
Anesthesia Evaluation  Patient identified by MRN, date of birth, ID band Patient awake    Reviewed: Allergy & Precautions, H&P , NPO status , Patient's Chart, lab work & pertinent test results  History of Anesthesia Complications Negative for: history of anesthetic complications  Airway Mallampati: III  TM Distance: <3 FB Neck ROM: full    Dental  (+) Missing, Poor Dentition, Edentulous Upper, Edentulous Lower   Pulmonary shortness of breath, asthma , sleep apnea , COPD, Current Smoker,           Cardiovascular Exercise Tolerance: Good hypertension, (-) angina+ Past MI and +CHF       Neuro/Psych PSYCHIATRIC DISORDERS Bipolar Disorder Schizophrenia  Neuromuscular disease CVA    GI/Hepatic Neg liver ROS, GERD  Controlled,  Endo/Other  diabetes, Type 2  Renal/GU negative Renal ROS  negative genitourinary   Musculoskeletal  (+) Arthritis ,   Abdominal   Peds  Hematology negative hematology ROS (+)   Anesthesia Other Findings Past Medical History: No date: Abdominal hernia No date: Arthritis No date: Asthma No date: Bladder dysfunction No date: Carpal tunnel syndrome No date: CHF (congestive heart failure) ([facility]) No date: COPD (chronic obstructive pulmonary disease) ([facility]) No date: DDD (degenerative disc disease), lumbar No date: Diabetes mellitus without complication ([facility]) No date: Dyspnea No date: Edema No date: Fatty tumor No date: GERD (gastroesophageal reflux disease) No date: H/O colonoscopy No date: H/O tubal ligation     Comment:  hx btl No date: History of cholecystectomy     Comment:  hx of cholecystectomy No date: HLD (hyperlipidemia) No date: Hypertension No date: Muscle cramps No date: Neuropathy No date: Obesity No date: Recurrent boils No date: Schizoaffective disorder (HCC) No date: Sleep apnea No date: Stroke [facility]) No date: UTI (lower urinary tract infection) No date: Vaginitis  Past  Surgical History: 09/24/2014: CARDIAC CATHETERIZATION; Left     Comment:  Procedure: Left Heart Cath and Coronary Angiography;                Surgeon: Dionisio David, MD;  Location: [place] [facility]               LAB;  Service: Cardiovascular;  Laterality: Left; No date: CHOLECYSTECTOMY 08/12/2015: COLONOSCOPY WITH PROPOFOL; N/A     Comment:  Procedure: COLONOSCOPY WITH PROPOFOL;  Surgeon: Lollie Sails, MD;  Location: [facility] ENDOSCOPY;  Service:               Endoscopy;  Laterality: N/A; 04/23/2017: COLONOSCOPY WITH PROPOFOL; N/A     Comment:  Procedure: COLONOSCOPY WITH PROPOFOL;  Surgeon: Jonathon Bellows, MD;  Location: [facility] ENDOSCOPY;  Service:               Endoscopy;  Laterality: N/A; 08/12/2015: ESOPHAGOGASTRODUODENOSCOPY (EGD) WITH PROPOFOL; N/A     Comment:  Procedure: ESOPHAGOGASTRODUODENOSCOPY (EGD) WITH               PROPOFOL;  Surgeon: Lollie Sails, MD;  Location:               [facility] ENDOSCOPY;  Service: Endoscopy;  Laterality: N/A; 03/06/2017: ESOPHAGOGASTRODUODENOSCOPY (EGD) WITH PROPOFOL; N/A     Comment:  Procedure: ESOPHAGOGASTRODUODENOSCOPY (EGD) WITH               PROPOFOL;  Surgeon: Lucilla Lame, MD;  Location: [facility]  ENDOSCOPY;  Service: Endoscopy;  Laterality: N/A; 05/25/2017: GIVENS CAPSULE STUDY; N/A     Comment:  Procedure: GIVENS CAPSULE STUDY;  Surgeon: Lucilla Lame,              MD;  Location: [facility] ENDOSCOPY;  Service: Endoscopy;                Laterality: N/A; No date: tubal ligation     Reproductive/Obstetrics negative OB ROS                             Anesthesia Physical Anesthesia Plan  ASA: III  Anesthesia Plan: General   Post-op Pain Management:    Induction: Intravenous  PONV Risk Score and Plan: Propofol infusion and TIVA  Airway Management Planned: Natural Airway and Nasal Cannula  Additional Equipment:   Intra-op Plan:   Post-operative Plan:   Informed Consent: I have  reviewed the patients History and Physical, chart, labs and discussed the procedure including the risks, benefits and alternatives for the proposed anesthesia with the patient or authorized representative who has indicated his/her understanding and acceptance.   Dental Advisory Given  Plan Discussed with: Anesthesiologist, CRNA and Surgeon  Anesthesia Plan Comments: (Patient consented for risks of anesthesia including but not limited to:  - adverse reactions to medications - risk of intubation if required - damage to teeth, lips or other oral mucosa - sore throat or hoarseness - Damage to heart, brain, lungs or loss of life  Patient voiced understanding.)        Anesthesia Quick Evaluation

## 2017-05-28 NOTE — Anesthesia Post-op Follow-up Note (Signed)
Anesthesia QCDR form completed.        

## 2017-05-31 NOTE — Anesthesia Postprocedure Evaluation (Signed)
Anesthesia Post Note  Patient: Akila Batta  Procedure(s) Performed: SMALL BOWEL ENTEROSCOPY (N/A )  Patient location during evaluation: Endoscopy Anesthesia Type: General Level of consciousness: awake and alert Pain management: pain level controlled Vital Signs Assessment: post-procedure vital signs reviewed and stable Respiratory status: spontaneous breathing, nonlabored ventilation, respiratory function stable and patient connected to nasal cannula oxygen Cardiovascular status: blood pressure returned to baseline and stable Postop Assessment: no apparent nausea or vomiting Anesthetic complications: no     Last Vitals:  Vitals:   05/28/17 1409 05/28/17 1419  BP: (!) 125/52 135/68  Pulse: 70 68  Resp: 13 13  Temp:    SpO2: 95% 97%    Last Pain:  Vitals:   05/28/17 1419  TempSrc:   PainSc: 0-No pain                 Precious Haws Jakeia Carreras

## 2017-06-03 ENCOUNTER — Other Ambulatory Visit: Payer: Self-pay

## 2017-06-03 DIAGNOSIS — K5520 Angiodysplasia of colon without hemorrhage: Secondary | ICD-10-CM

## 2017-06-03 MED ORDER — PEG 3350-KCL-NABCB-NACL-NASULF 236 G PO SOLR: ORAL | 0 refills | Status: DC

## 2017-06-04 ENCOUNTER — Other Ambulatory Visit: Payer: Self-pay

## 2017-06-04 ENCOUNTER — Ambulatory Visit: Payer: Medicare Other | Admitting: Anesthesiology

## 2017-06-04 ENCOUNTER — Encounter: Payer: Self-pay | Admitting: Anesthesiology

## 2017-06-04 ENCOUNTER — Encounter
Admission: RE | Disposition: A | Discharge status: Home / Self Care | Payer: Self-pay | Source: Ambulatory Visit | Attending: Gastroenterology

## 2017-06-04 ENCOUNTER — Ambulatory Visit
Admission: RE | Admit: 2017-06-04 | Discharge: 2017-06-04 | Disposition: A | Discharge status: Home / Self Care | Payer: Medicare Other | Source: Ambulatory Visit | Attending: Gastroenterology | Admitting: Gastroenterology

## 2017-06-04 DIAGNOSIS — Z91030 Bee allergy status: Secondary | ICD-10-CM | POA: Diagnosis not present

## 2017-06-04 DIAGNOSIS — E785 Hyperlipidemia, unspecified: Secondary | ICD-10-CM | POA: Diagnosis not present

## 2017-06-04 DIAGNOSIS — Z886 Allergy status to analgesic agent status: Secondary | ICD-10-CM | POA: Insufficient documentation

## 2017-06-04 DIAGNOSIS — I509 Heart failure, unspecified: Secondary | ICD-10-CM | POA: Insufficient documentation

## 2017-06-04 DIAGNOSIS — Z79899 Other long term (current) drug therapy: Secondary | ICD-10-CM | POA: Insufficient documentation

## 2017-06-04 DIAGNOSIS — E669 Obesity, unspecified: Secondary | ICD-10-CM | POA: Diagnosis not present

## 2017-06-04 DIAGNOSIS — K5521 Angiodysplasia of colon with hemorrhage: Secondary | ICD-10-CM | POA: Diagnosis not present

## 2017-06-04 DIAGNOSIS — Z888 Allergy status to other drugs, medicaments and biological substances status: Secondary | ICD-10-CM | POA: Insufficient documentation

## 2017-06-04 DIAGNOSIS — F17210 Nicotine dependence, cigarettes, uncomplicated: Secondary | ICD-10-CM | POA: Diagnosis not present

## 2017-06-04 DIAGNOSIS — K921 Melena: Secondary | ICD-10-CM | POA: Insufficient documentation

## 2017-06-04 DIAGNOSIS — Z91013 Allergy to seafood: Secondary | ICD-10-CM | POA: Insufficient documentation

## 2017-06-04 DIAGNOSIS — Z6841 Body Mass Index (BMI) 40.0 and over, adult: Secondary | ICD-10-CM | POA: Insufficient documentation

## 2017-06-04 DIAGNOSIS — Z7982 Long term (current) use of aspirin: Secondary | ICD-10-CM | POA: Diagnosis not present

## 2017-06-04 DIAGNOSIS — Z8673 Personal history of transient ischemic attack (TIA), and cerebral infarction without residual deficits: Secondary | ICD-10-CM | POA: Diagnosis not present

## 2017-06-04 DIAGNOSIS — Z885 Allergy status to narcotic agent status: Secondary | ICD-10-CM | POA: Diagnosis not present

## 2017-06-04 DIAGNOSIS — J449 Chronic obstructive pulmonary disease, unspecified: Secondary | ICD-10-CM | POA: Diagnosis not present

## 2017-06-04 DIAGNOSIS — E1140 Type 2 diabetes mellitus with diabetic neuropathy, unspecified: Secondary | ICD-10-CM | POA: Insufficient documentation

## 2017-06-04 DIAGNOSIS — M1990 Unspecified osteoarthritis, unspecified site: Secondary | ICD-10-CM | POA: Insufficient documentation

## 2017-06-04 DIAGNOSIS — F259 Schizoaffective disorder, unspecified: Secondary | ICD-10-CM | POA: Diagnosis not present

## 2017-06-04 DIAGNOSIS — I110 Hypertensive heart disease with heart failure: Secondary | ICD-10-CM | POA: Insufficient documentation

## 2017-06-04 DIAGNOSIS — K5520 Angiodysplasia of colon without hemorrhage: Secondary | ICD-10-CM

## 2017-06-04 DIAGNOSIS — G4730 Sleep apnea, unspecified: Secondary | ICD-10-CM | POA: Insufficient documentation

## 2017-06-04 DIAGNOSIS — Z794 Long term (current) use of insulin: Secondary | ICD-10-CM | POA: Diagnosis not present

## 2017-06-04 DIAGNOSIS — K219 Gastro-esophageal reflux disease without esophagitis: Secondary | ICD-10-CM | POA: Diagnosis not present

## 2017-06-04 HISTORY — PX: ENTEROSCOPY: SHX5533

## 2017-06-04 LAB — GLUCOSE, CAPILLARY: Glucose-Capillary: 131 mg/dL — ABNORMAL HIGH (ref 65–99)

## 2017-06-04 SURGERY — ENTEROSCOPY
Anesthesia: General

## 2017-06-04 MED ORDER — PROPOFOL 500 MG/50ML IV EMUL
INTRAVENOUS | Status: AC
  Filled 2017-06-04: qty 50

## 2017-06-04 MED ORDER — SODIUM CHLORIDE 0.9 % IV SOLN
INTRAVENOUS | Status: DC
  Administered 2017-06-04: 11:00:00 via INTRAVENOUS

## 2017-06-04 MED ORDER — PROPOFOL 10 MG/ML IV BOLUS
INTRAVENOUS | Status: DC | PRN
  Administered 2017-06-04: 50 mg via INTRAVENOUS

## 2017-06-04 MED ORDER — LIDOCAINE HCL (CARDIAC) PF 100 MG/5ML IV SOSY
PREFILLED_SYRINGE | INTRAVENOUS | Status: DC | PRN
  Administered 2017-06-04: 50 mg via INTRAVENOUS

## 2017-06-04 MED ORDER — LIDOCAINE HCL (PF) 2 % IJ SOLN
INTRAMUSCULAR | Status: AC
  Filled 2017-06-04: qty 10

## 2017-06-04 MED ORDER — PROPOFOL 500 MG/50ML IV EMUL
INTRAVENOUS | Status: DC | PRN
  Administered 2017-06-04: 150 ug/kg/min via INTRAVENOUS

## 2017-06-04 NOTE — Anesthesia Post-op Follow-up Note (Signed)
Anesthesia QCDR form completed.        

## 2017-06-04 NOTE — Transfer of Care (Signed)
Immediate Anesthesia Transfer of Care Note  Patient: Cristina [name]  Procedure(s) Performed: RETROGRADE SMALL BOWEL ENTEROSCOPY (N/A )  Patient Location: PACU and Endoscopy Unit  Anesthesia Type:General  Level of Consciousness: drowsy  Airway & Oxygen Therapy: Patient Spontanous Breathing  Post-op Assessment: Report given to RN  Post vital signs: stable  Last Vitals:  Vitals Value Taken Time  BP    Temp    Pulse 88 06/04/2017  2:16 PM  Resp 17 06/04/2017  2:16 PM  SpO2 100 % 06/04/2017  2:16 PM  Vitals shown include unvalidated device data.  Last Pain:  Vitals:   06/04/17 1046  TempSrc: Tympanic         Complications: No apparent anesthesia complications

## 2017-06-04 NOTE — Anesthesia Preprocedure Evaluation (Signed)
Anesthesia Evaluation  Patient identified by MRN, date of birth, ID band Patient awake    Reviewed: Allergy & Precautions, NPO status , Patient's Chart, lab work & pertinent test results, reviewed documented beta blocker date and time   Airway Mallampati: III  TM Distance: >3 FB     Dental  (+) Chipped   Pulmonary shortness of breath, asthma , sleep apnea , COPD, Current Smoker,           Cardiovascular hypertension, Pt. on medications and Pt. on home beta blockers +CHF       Neuro/Psych PSYCHIATRIC DISORDERS Bipolar Disorder Schizophrenia  Neuromuscular disease CVA    GI/Hepatic GERD  ,  Endo/Other  diabetes, Type 2  Renal/GU      Musculoskeletal  (+) Arthritis ,   Abdominal   Peds  Hematology  (+) anemia ,   Anesthesia Other Findings   Reproductive/Obstetrics                             Anesthesia Physical Anesthesia Plan  ASA: III  Anesthesia Plan: General   Post-op Pain Management:    Induction: Intravenous  PONV Risk Score and Plan:   Airway Management Planned:   Additional Equipment:   Intra-op Plan:   Post-operative Plan:   Informed Consent: I have reviewed the patients History and Physical, chart, labs and discussed the procedure including the risks, benefits and alternatives for the proposed anesthesia with the patient or authorized representative who has indicated his/her understanding and acceptance.     Plan Discussed with: CRNA  Anesthesia Plan Comments:         Anesthesia Quick Evaluation

## 2017-06-04 NOTE — Op Note (Signed)
[facility] Patient Name: Cristina [name] Procedure Date: 06/04/2017 1:31 PM MRN: [id number] Account #: [id number] Date of Birth: May 25, 1960 Admit Type: Outpatient Age: 57 Room: [facility] ENDO ROOM 2 Gender: Female Note Status: Finalized Procedure:            Small bowel enteroscopy Indications:          Melena, Gastrointestinal occult blood loss,                        Arteriovenous malformation in the small intestine Providers:            Lin Landsman MD, MD Medicines:            Monitored Anesthesia Care Complications:        No immediate complications. Estimated blood loss: None. Procedure:            Pre-Anesthesia Assessment:                       - Prior to the procedure, a History and Physical was                        performed, and patient medications and allergies were                        reviewed. The patient is competent. The risks and                        benefits of the procedure and the sedation options and                        risks were discussed with the patient. All questions                        were answered and informed consent was obtained.                        Patient identification and proposed procedure were                        verified by the physician, the nurse, the                        anesthesiologist, the anesthetist and the technician in                        the pre-procedure area in the procedure room in the                        endoscopy suite. Mental Status Examination: alert and                        oriented. Airway Examination: normal oropharyngeal                        airway and neck mobility. Respiratory Examination:                        clear to auscultation. CV Examination: normal.  Prophylactic Antibiotics: The patient does not require                        prophylactic antibiotics. Prior Anticoagulants: The                        patient has taken aspirin.  ASA Grade Assessment: III -                        A patient with severe systemic disease. After reviewing                        the risks and benefits, the patient was deemed in                        satisfactory condition to undergo the procedure. The                        anesthesia plan was to use monitored anesthesia care                        (MAC). Immediately prior to administration of                        medications, the patient was re-assessed for adequacy                        to receive sedatives. The heart rate, respiratory rate,                        oxygen saturations, blood pressure, adequacy of                        pulmonary ventilation, and response to care were                        monitored throughout the procedure. The physical status                        of the patient was re-assessed after the procedure.                       After obtaining informed consent, the endoscope was                        passed under direct vision. Throughout the procedure,                        the patient's blood pressure, pulse, and oxygen                        saturations were monitored continuously. The                        Colonoscope was introduced through the anus and                        advanced cecum and to 70cm of small intestine proximal  to the IC valve. The colonoscopy was performed without                        difficulty. The patient tolerated the procedure well.                        The quality of the bowel preparation was adequate. Findings:      The perianal and digital rectal examinations were normal. Pertinent       negatives include normal sphincter tone and no palpable rectal lesions.      The examined small intestine was normal without evidence of AVMs.       Reached to 70cm proximal to IC valve, area was tattooed with an       injection of [place] ink.      The colon (entire examined portion) appeared  normal. Impression:           - The examined portion of the ileum was normal.                        Tattooed.                       - The entire examined colon is normal.                       - No specimens collected. Recommendation:       - Discharge patient to a nursing home (with escort).                       - Resume previous diet today.                       - Continue present medications.                       - Recommend anterograde push enetroscopy if patient                        rebleeds                       - Avoid ASA93m if not absolutely indicated to prevent                        recurrent bledding from small bowel AVMs                       - Return to GI clinic in 2 weeks. Procedure Code(s):    --- Professional ---                       [id number] Colonoscopy, flexible; with directed submucosal                        injection(s), any substance Diagnosis Code(s):    --- Professional ---                       K92.1, Melena (includes Hematochezia)                       R19.5, Other fecal abnormalities  Q27.33, Arteriovenous malformation of digestive system                        vessel CPT copyright 2017 American Medical Association. All rights reserved. The codes documented in this report are preliminary and upon coder review may  be revised to meet current compliance requirements. Dr. Ulyess Mort Lin Landsman MD, MD 06/04/2017 2:20:47 PM This report has been signed electronically. Number of Addenda: 0 Note Initiated On: 06/04/2017 1:31 PM Scope Withdrawal Time: 0 hours 20 minutes 59 seconds  Total Procedure Duration: 0 hours 30 minutes 41 seconds       [facility]

## 2017-06-04 NOTE — H&P (Signed)
Cephas Darby, MD [address]  [place]  [place], [place] [address]  Main: [phone]  Fax: [phone] Pager: [phone]  Primary Care Physician:  System, Pcp Not In Primary Gastroenterologist:  Dr. Cephas Darby  Pre-Procedure History & Physical: HPI:  Cristina [name] is a 57 y.o. female is here for an retrograde enteroscopy.   Past Medical History:  Diagnosis Date  . Abdominal hernia   . Arthritis   . Asthma   . Bladder dysfunction   . Carpal tunnel syndrome   . CHF (congestive heart failure) ([place])   . COPD (chronic obstructive pulmonary disease) ([place])   . DDD (degenerative disc disease), lumbar   . Diabetes mellitus without complication ([place])   . Dyspnea   . Edema   . Fatty tumor   . GERD (gastroesophageal reflux disease)   . H/O colonoscopy   . H/O tubal ligation    hx btl  . History of cholecystectomy    hx of cholecystectomy  . HLD (hyperlipidemia)   . Hypertension   . Muscle cramps   . Neuropathy   . Obesity   . Recurrent boils   . Schizoaffective disorder ([place])   . Sleep apnea   . Stroke ([place])   . UTI (lower urinary tract infection)   . Vaginitis     Past Surgical History:  Procedure Laterality Date  . CARDIAC CATHETERIZATION Left 09/24/2014   Procedure: Left Heart Cath and Coronary Angiography;  Surgeon: Dionisio David, MD;  Location: [facility] CV LAB;  Service: Cardiovascular;  Laterality: Left;  . CHOLECYSTECTOMY    . COLONOSCOPY WITH PROPOFOL N/A 08/12/2015   Procedure: COLONOSCOPY WITH PROPOFOL;  Surgeon: Lollie Sails, MD;  Location: [facility] ENDOSCOPY;  Service: Endoscopy;  Laterality: N/A;  . COLONOSCOPY WITH PROPOFOL N/A 04/23/2017   Procedure: COLONOSCOPY WITH PROPOFOL;  Surgeon: Jonathon Bellows, MD;  Location: [facility] ENDOSCOPY;  Service: Endoscopy;  Laterality: N/A;  . ENTEROSCOPY N/A 05/28/2017   Procedure: SMALL BOWEL ENTEROSCOPY;  Surgeon: Lin Landsman, MD;  Location: [facility] ENDOSCOPY;  Service: Gastroenterology;  Laterality:  N/A;  . ESOPHAGOGASTRODUODENOSCOPY (EGD) WITH PROPOFOL N/A 08/12/2015   Procedure: ESOPHAGOGASTRODUODENOSCOPY (EGD) WITH PROPOFOL;  Surgeon: Lollie Sails, MD;  Location: [facility] ENDOSCOPY;  Service: Endoscopy;  Laterality: N/A;  . ESOPHAGOGASTRODUODENOSCOPY (EGD) WITH PROPOFOL N/A 03/06/2017   Procedure: ESOPHAGOGASTRODUODENOSCOPY (EGD) WITH PROPOFOL;  Surgeon: Lucilla Lame, MD;  Location: [facility] ENDOSCOPY;  Service: Endoscopy;  Laterality: N/A;  . GIVENS CAPSULE STUDY N/A 05/25/2017   Procedure: GIVENS CAPSULE STUDY;  Surgeon: Lucilla Lame, MD;  Location: [facility] ENDOSCOPY;  Service: Endoscopy;  Laterality: N/A;  . tubal ligation      Prior to Admission medications   Medication Sig Start Date End Date Taking? Authorizing Provider  acetaminophen (TYLENOL) 500 MG tablet Take 500 mg by mouth every 8 (eight) hours as needed for mild pain or moderate pain.   Yes [provider]  albuterol (PROVENTIL HFA;VENTOLIN HFA) 108 (90 BASE) MCG/ACT inhaler Inhale 2 puffs into the lungs every 6 (six) hours as needed for wheezing or shortness of breath.   Yes [provider]  aspirin EC 81 MG tablet Take 81 mg by mouth daily.   Yes [provider]  atorvastatin (LIPITOR) 20 MG tablet Take 20 mg by mouth at bedtime.   Yes [provider]  busPIRone (BUSPAR) 10 MG tablet Take 10 mg by mouth 2 (two) times daily.    Yes [provider]  cetirizine (ZYRTEC) 10 MG tablet Take  10 mg by mouth daily.    Yes [provider]  Cholecalciferol (VITAMIN D3) 50000 units TABS Take by mouth.    Yes [provider]  Cyanocobalamin (B-12) 1000 MCG CAPS Take 1,000 mcg by mouth daily. 05/10/17  Yes Karen Kitchens, NP  diphenoxylate-atropine (LOMOTIL) 2.5-0.025 MG tablet Take 1 tablet by mouth daily as needed for diarrhea or loose stools.   Yes [provider]  empagliflozin (JARDIANCE) 10 MG TABS tablet Take by mouth. 02/22/17  Yes [provider]    Fluticasone-Salmeterol (ADVAIR) 250-50 MCG/DOSE AEPB Inhale 1 puff into the lungs 2 (two) times daily.   Yes [provider]  haloperidol decanoate (HALDOL DECANOATE) 100 MG/ML injection Inject 1 mL (100 mg total) into the muscle every 30 (thirty) days. 08/02/15  Yes Hildred Priest, MD  insulin detemir (LEVEMIR) 100 UNIT/ML injection Inject 0.35 mLs (35 Units total) into the skin 2 (two) times daily. Pt uses 50 units in the morning and 52 units at bedtime. 07/18/16  Yes Gouru, Aruna, MD  Insulin Pen Needle (FIFTY50 PEN NEEDLES) 31G X 8 MM MISC PEN NEEDLES 31G X 6 MM MISC 10/07/12  Yes [provider]  loperamide (IMODIUM A-D) 2 MG tablet Take 2 mg by mouth 4 (four) times daily as needed for diarrhea or loose stools.   Yes [provider]  menthol-cetylpyridinium (CEPACOL) 3 MG lozenge Take 1 lozenge by mouth as needed for sore throat.   Yes [provider]  metFORMIN (GLUCOPHAGE-XR) 500 MG 24 hr tablet Take 1,000 mg by mouth 2 (two) times daily.  05/23/15  Yes [provider]  nitroGLYCERIN (NITROSTAT) 0.3 MG SL tablet Place 0.3 mg under the tongue every 5 (five) minutes as needed for chest pain.   Yes [provider]  ondansetron (ZOFRAN) 4 MG tablet Take 4 mg by mouth every 6 (six) hours as needed for nausea or vomiting.   Yes [provider]  pantoprazole (PROTONIX) 40 MG tablet Take 40 mg by mouth 2 (two) times daily.   Yes [provider]  polyethylene glycol (GOLYTELY) 236 g solution Drink one 8 oz glass every 20 mins until entire container is finished 06/03/17  Yes Obbie Lewallen, Tally Due, MD  potassium chloride 20 MEQ TBCR Take 10 mEq by mouth daily. 07/18/16  Yes Gouru, Illene Silver, MD  pregabalin (LYRICA) 100 MG capsule Take 100 mg by mouth 3 (three) times daily.   Yes [provider]  senna-docusate (SENOKOT-S) 8.6-50 MG tablet Take 1 tablet by mouth at bedtime as needed for mild constipation. 07/18/16  Yes Gouru,  Aruna, MD  umeclidinium-vilanterol (ANORO ELLIPTA) 62.5-25 MCG/INH AEPB Inhale 1 puff into the lungs daily.   Yes [provider]  venlafaxine XR (EFFEXOR-XR) 150 MG 24 hr capsule Take 225 mg by mouth daily with breakfast.   Yes [provider]  venlafaxine XR (EFFEXOR-XR) 75 MG 24 hr capsule Take 75 mg by mouth daily with breakfast.    Yes [provider]  Vitamin D, Ergocalciferol, (DRISDOL) 50000 units CAPS capsule Take by mouth.   Yes [provider]  clonazePAM (KLONOPIN) 0.5 MG tablet Take 1 tablet (0.5 mg total) by mouth 2 (two) times daily. Patient taking differently: Take 0.5 mg by mouth daily.  07/18/16   Gouru, Illene Silver, MD  empagliflozin (JARDIANCE) 10 MG TABS tablet Take 10 mg by mouth daily.    [provider]  ferrous sulfate 325 (65 FE) MG EC tablet Take 1 tablet (325 mg total) by mouth  2 (two) times daily. 03/07/17 05/17/17  Hillary Bow, MD  furosemide (LASIX) 20 MG tablet Take 1 tablet (20 mg total) by mouth 2 (two) times daily. Patient taking differently: Take 20 mg by mouth daily.  07/18/16   Nicholes Mango, MD  haloperidol (HALDOL) 5 MG tablet Take 1 tablet (5 mg total) by mouth at bedtime. Patient taking differently: Take 5 mg by mouth 2 (two) times daily.  10/31/15   Bettey Costa, MD  metoprolol tartrate (LOPRESSOR) 25 MG tablet Take 0.5 tablets (12.5 mg total) by mouth 2 (two) times daily. Patient taking differently: Take 50 mg by mouth 2 (two) times daily.  07/18/16   Nicholes Mango, MD    Allergies as of 06/03/2017 - Review Complete 05/28/2017  Allergen Reaction Noted  . Bee venom Anaphylaxis and Shortness Of Breath 12/05/2014  . Shellfish allergy Anaphylaxis and Shortness Of Breath 06/07/2015  . Shellfish-derived products Anaphylaxis and Shortness Of Breath 12/05/2014  . Codeine  08/01/2014  . Thorazine [chlorpromazine] Swelling 09/23/2014  . Tylenol with codeine #3 [acetaminophen-codeine] Other (See Comments) 12/05/2014  .  Metronidazole Rash 06/26/2015    Family History  Problem Relation Age of Onset  . CAD Mother   . Lung cancer Mother   . Mental illness Mother   . Mental illness Father 32       suicided   . Mental illness Sister   . CAD Sister   . Mental illness Brother   . CAD Sister     Social History   Socioeconomic History  . Marital status: Single    Spouse name: Not on file  . Number of children: Not on file  . Years of education: Not on file  . Highest education level: Not on file  Occupational History  . Occupation: disabled  Social Needs  . Financial resource strain: Not on file  . Food insecurity:    Worry: Not on file    Inability: Not on file  . Transportation needs:    Medical: Not on file    Non-medical: Not on file  Tobacco Use  . Smoking status: Current Some Day Smoker    Packs/day: 0.25    Types: Cigarettes  . Smokeless tobacco: Former Systems developer    Types: Chew  Substance and Sexual Activity  . Alcohol use: No    Alcohol/week: 0.0 oz  . Drug use: No  . Sexual activity: Not on file  Lifestyle  . Physical activity:    Days per week: Not on file    Minutes per session: Not on file  . Stress: Not on file  Relationships  . Social connections:    Talks on phone: Not on file    Gets together: Not on file    Attends religious service: Not on file    Active member of club or organization: Not on file    Attends meetings of clubs or organizations: Not on file    Relationship status: Not on file  . Intimate partner violence:    Fear of current or ex partner: Not on file    Emotionally abused: Not on file    Physically abused: Not on file    Forced sexual activity: Not on file  Other Topics Concern  . Not on file  Social History Narrative  . Not on file    Review of Systems: See HPI, otherwise negative ROS  Physical Exam: BP 138/74   Pulse 85   Temp (!) 97 F (36.1 C) (Tympanic)   Resp  16   Ht 4\' 11"  (1.499 m)   Wt 227 lb (103 kg)   SpO2 99%   BMI 45.85  kg/m  General:   Alert,  pleasant and cooperative in NAD Head:  Normocephalic and atraumatic. Neck:  Supple; no masses or thyromegaly. Lungs:  Clear throughout to auscultation.    Heart:  Regular rate and rhythm. Abdomen:  Soft, nontender and nondistended. Normal bowel sounds, without guarding, and without rebound.   Neurologic:  Alert and  oriented x4;  grossly normal neurologically.  Impression/Plan: Cristina [name] is here for a retrograde enteroscopy to be performed for small bowel AVMs, obscure GI bleed  Risks, benefits, limitations, and alternatives regarding small bowel enteroscopy have been reviewed with the patient.  Questions have been answered.  All parties agreeable.   Sherri Sear, MD  06/04/2017, 12:17 PM

## 2017-06-04 NOTE — Anesthesia Postprocedure Evaluation (Signed)
Anesthesia Post Note  Patient: Cristina [name]  Procedure(s) Performed: RETROGRADE SMALL BOWEL ENTEROSCOPY (N/A )  Patient location during evaluation: Endoscopy Anesthesia Type: General Level of consciousness: awake and alert Pain management: pain level controlled Vital Signs Assessment: post-procedure vital signs reviewed and stable Respiratory status: spontaneous breathing, nonlabored ventilation, respiratory function stable and patient connected to nasal cannula oxygen Cardiovascular status: blood pressure returned to baseline and stable Postop Assessment: no apparent nausea or vomiting Anesthetic complications: no     Last Vitals:  Vitals:   06/04/17 1400 06/04/17 1416  BP: 124/68   Pulse: 88   Resp: 16   Temp: 36.9 C 36.9 C  SpO2: 100%     Last Pain:  Vitals:   06/04/17 1436  TempSrc:   PainSc: 0-No pain                 Precious Haws Ambar Raphael

## 2017-06-07 ENCOUNTER — Encounter: Payer: Self-pay | Admitting: Gastroenterology

## 2017-06-09 ENCOUNTER — Ambulatory Visit: Payer: Medicare Other | Attending: Neurology

## 2017-06-10 ENCOUNTER — Ambulatory Visit: Payer: Medicare Other | Attending: Adult Health Nurse Practitioner

## 2017-06-10 DIAGNOSIS — G4733 Obstructive sleep apnea (adult) (pediatric): Secondary | ICD-10-CM | POA: Diagnosis not present

## 2017-06-10 DIAGNOSIS — F5101 Primary insomnia: Secondary | ICD-10-CM | POA: Diagnosis not present

## 2017-06-16 NOTE — Progress Notes (Signed)
[facility] day:  06/17/2017  Chief Complaint: Cristina [name] is a 57 y.o. female with iron deficiency anemia and B12 deficiency who is seen for 1 month assessment after interval IV iron and endoscopic studies.  HPI:  The patient was last seen in the hematology clinic on 05/17/2017. At that time, patient was feeling "about the same". She complained of chronic fatigue. Spent most of her day resting/sleeping. She denied bleeding. Continued on oral B12. Eating well, but diet was not iron rich. Weight down 5 pounds. Exam stable. Patient scheduled for VCE and PSG. +  Patient received Feraheme 510 mg on 05/17/2017 and 05/24/2017.  Patient underwent a small bowel enteroscopy (SBE) on 05/28/2017 with Dr. Marius Ditch. Pre-procedural hemoglobin was 9.8, hematocrit 32.1, MCV 70.4, and platelets 242,000. 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 There was also a single angiodysplastic lesion in the mid-jejunum. There was no active bleeding appreciated with direct visualization. No biopsies were taken. Lesions treated with argon plasma coagulation (APC). Patient to return in 1 week for retrograde SBE.   She underwent retrograde SBE on 06/04/2017 that demonstrated a normal ileum under direct visualization. There was no bleeding. No biopsies were taken. Recommendations were for anterograde push enteroscopy if patient experiences bleeding again.   Patient has ordered PSG on 05/31/2017. Mild OSAH demonstrated; AHI score was 6.9. Patient with oxygen desaturations as low as 85%. Patient was brought back in for a CPAP titration study. Patient's optimal nocturnal PAP pressure found to be 13 cmH20.   In the interim, patient noted to be "up and moving a little more" rather than sleeping most of the day. Patient denies chest pain and shortness of breath. Patient denies bleeding; no hematochezia, melena, or gross hematuria. She continues to have IBS  symptoms. UA continues to have a "funny smell".   Patient has no B symptoms. No recent infections. Patient eating well. Her weight is up 1 pound.  She denies pain in the clinic today.    Past Medical History:  Diagnosis Date  . Abdominal hernia   . Arthritis   . Asthma   . Bladder dysfunction   . Carpal tunnel syndrome   . CHF (congestive heart failure) ([place])   . COPD (chronic obstructive pulmonary disease) ([place])   . DDD (degenerative disc disease), lumbar   . Diabetes mellitus without complication ([facility])   . Dyspnea   . Edema   . Fatty tumor   . GERD (gastroesophageal reflux disease)   . H/O colonoscopy   . H/O tubal ligation    hx btl  . History of cholecystectomy    hx of cholecystectomy  . HLD (hyperlipidemia)   . Hypertension   . Muscle cramps   . Neuropathy   . Obesity   . Recurrent boils   . Schizoaffective disorder ([place])   . Sleep apnea   . Stroke ([place])   . UTI (lower urinary tract infection)   . Vaginitis     Past Surgical History:  Procedure Laterality Date  . CARDIAC CATHETERIZATION Left 09/24/2014   Procedure: Left Heart Cath and Coronary Angiography;  Surgeon: Dionisio David, MD;  Location: [place] [facility];  Service: Cardiovascular;  Laterality: Left;  . CHOLECYSTECTOMY    . COLONOSCOPY WITH PROPOFOL N/A 08/12/2015   Procedure: COLONOSCOPY WITH PROPOFOL;  Surgeon: Lollie Sails, MD;  Location: [facility] ENDOSCOPY;  Service: Endoscopy;  Laterality: N/A;  . COLONOSCOPY WITH PROPOFOL N/A 04/23/2017  Procedure: COLONOSCOPY WITH PROPOFOL;  Surgeon: Jonathon Bellows, MD;  Location: [facility] ENDOSCOPY;  Service: Endoscopy;  Laterality: N/A;  . ENTEROSCOPY N/A 05/28/2017   Procedure: SMALL BOWEL ENTEROSCOPY;  Surgeon: Lin Landsman, MD;  Location[facility] ENDOSCOPY;  Service: Gastroenterology;  Laterality: N/A;  . ENTEROSCOPY N/A 06/04/2017   Procedure: RETROGRADE SMALL BOWEL ENTEROSCOPY;  Surgeon: Lin Landsman, MD;  Location: [facility] ENDOSCOPY;  Service:  Gastroenterology;  Laterality: N/A;  . ESOPHAGOGASTRODUODENOSCOPY (EGD) WITH PROPOFOL N/A 08/12/2015   Procedure: ESOPHAGOGASTRODUODENOSCOPY (EGD) WITH PROPOFOL;  Surgeon: Lollie Sails, MD;  Location: [facility] ENDOSCOPY;  Service: Endoscopy;  Laterality: N/A;  . ESOPHAGOGASTRODUODENOSCOPY (EGD) WITH PROPOFOL N/A 03/06/2017   Procedure: ESOPHAGOGASTRODUODENOSCOPY (EGD) WITH PROPOFOL;  Surgeon: Lucilla Lame, MD;  Location: [facility] ENDOSCOPY;  Service: Endoscopy;  Laterality: N/A;  . GIVENS CAPSULE STUDY N/A 05/25/2017   Procedure: GIVENS CAPSULE STUDY;  Surgeon: Lucilla Lame, MD;  Location: [facility] ENDOSCOPY;  Service: Endoscopy;  Laterality: N/A;  . tubal ligation      Family History  Problem Relation Age of Onset  . CAD Mother   . Lung cancer Mother   . Mental illness Mother   . Mental illness Father 72       suicided   . Mental illness Sister   . CAD Sister   . Mental illness Brother   . CAD Sister     Social History:  reports that she has been smoking cigarettes.  She has been smoking about 0.25 packs per day. She has quit using smokeless tobacco. Her smokeless tobacco use included chew. She reports that she does not drink alcohol or use drugs.  She smokes 1/3 pack/day for many years.  She quit 11 years ago, but recently started smoking again.  She stopped working in 1999 due to disability.  She is a resident of [facility]; moved there in October 2017.  The patient is accompanied by Zacarias Pontes, her caregiver at [facility], today.  Allergies:  Allergies  Allergen Reactions  . Bee Venom Anaphylaxis and Shortness Of Breath    Stated by Patient  . Shellfish Allergy Anaphylaxis and Shortness Of Breath    Stated by Patient Stated by Patient   . Shellfish-Derived Products Anaphylaxis and Shortness Of Breath    Stated by Patient  . Codeine   . Thorazine [Chlorpromazine] Swelling  . Tylenol With Codeine #3 [Acetaminophen-Codeine] Other (See Comments)    Hallucinations  .  Metronidazole Rash    Current Medications: Current Outpatient Medications  Medication Sig Dispense Refill  . acetaminophen (TYLENOL) 500 MG tablet Take 500 mg by mouth every 8 (eight) hours as needed for mild pain or moderate pain.    Marland Kitchen aspirin EC 81 MG tablet Take 81 mg by mouth daily.    Marland Kitchen atorvastatin (LIPITOR) 20 MG tablet Take 20 mg by mouth at bedtime.    . busPIRone (BUSPAR) 10 MG tablet Take 10 mg by mouth 2 (two) times daily.     . clonazePAM (KLONOPIN) 0.5 MG tablet Take 1 tablet (0.5 mg total) by mouth 2 (two) times daily. (Patient taking differently: Take 0.5 mg by mouth daily. ) 15 tablet 0  . Cyanocobalamin (B-12) 1000 MCG CAPS Take 1,000 mcg by mouth daily. 90 capsule 0  . diphenoxylate-atropine (LOMOTIL) 2.5-0.025 MG tablet Take 1 tablet by mouth daily as needed for diarrhea or loose stools.    . ferrous sulfate 325 (65 FE) MG EC tablet Take 1 tablet (325 mg total) by mouth 2 (two) times daily. [place]  tablet 0  . furosemide (LASIX) 20 MG tablet Take 1 tablet (20 mg total) by mouth 2 (two) times daily. (Patient taking differently: Take 20 mg by mouth daily. ) 60 tablet 0  . haloperidol decanoate (HALDOL DECANOATE) 100 MG/ML injection Inject 1 mL (100 mg total) into the muscle every 30 (thirty) days. 1 mL 0  . insulin aspart (NOVOLOG) 100 UNIT/ML injection Inject 100 Units into the skin 3 (three) times daily before meals.    . Insulin Pen Needle (FIFTY50 PEN NEEDLES) 31G X 8 MM MISC PEN NEEDLES 31G X 6 MM MISC    . loperamide (IMODIUM A-D) 2 MG tablet Take 2 mg by mouth 4 (four) times daily as needed for diarrhea or loose stools.    . menthol-cetylpyridinium (CEPACOL) 3 MG lozenge Take 1 lozenge by mouth as needed for sore throat.    . metFORMIN (GLUCOPHAGE-XR) 500 MG 24 hr tablet Take 1,000 mg by mouth 2 (two) times daily.     . metoprolol tartrate (LOPRESSOR) 25 MG tablet Take 0.5 tablets (12.5 mg total) by mouth 2 (two) times daily. (Patient taking differently: Take 50 mg by mouth 2  (two) times daily. )    . nitroGLYCERIN (NITROSTAT) 0.3 MG SL tablet Place 0.3 mg under the tongue every 5 (five) minutes as needed for chest pain.    Marland Kitchen ondansetron (ZOFRAN) 4 MG tablet Take 4 mg by mouth every 6 (six) hours as needed for nausea or vomiting.    . pantoprazole (PROTONIX) 40 MG tablet Take 40 mg by mouth 2 (two) times daily.    . potassium chloride 20 MEQ TBCR Take 10 mEq by mouth daily. 30 tablet 0  . pregabalin (LYRICA) 100 MG capsule Take 100 mg by mouth 3 (three) times daily.    Marland Kitchen umeclidinium-vilanterol (ANORO ELLIPTA) 62.5-25 MCG/INH AEPB Inhale 1 puff into the lungs daily.    Marland Kitchen venlafaxine XR (EFFEXOR-XR) 150 MG 24 hr capsule Take 225 mg by mouth daily with breakfast.    . venlafaxine XR (EFFEXOR-XR) 75 MG 24 hr capsule Take 75 mg by mouth daily with breakfast.      No current facility-administered medications for this visit.     Review of Systems:  GENERAL:  Feels "better". Up and moving more.  No fevers or sweats. Weight up 1 pound.  PERFORMANCE STATUS (ECOG):  3-4. HEENT:  Cataracts. No visual changes, runny nose, sore throat, mouth sores or tenderness. Lungs: No shortness of breath or cough.  No hemoptysis. Cardiac:  No chest pain, palpitations, orthopnea, or PND. GI:  IBS symptoms. No nausea, vomiting, diarrhea, constipation, melena or hematochezia. GU:  Chronic malodorous urine. Bladder leakage ("wears a pull-up").  No urgency, frequency, dysuria, or hematuria. Musculoskeletal:  No back pain.  No joint pain.  No muscle tenderness. Extremities:  No pain or swelling. Skin:  No rashes or skin changes. Neuro:  No headache, numbness or weakness, balance or coordination issues. Endocrine:  Diabetes. No thyroid issues, hot flashes or night sweats. Psych:  No mood changes, depression or anxiety. Pain:  No focal pain. Review of systems:  All other systems reviewed and found to be negative.   Physical Exam: Blood pressure 119/79, pulse 69, temperature (!) 96.9 F  (36.1 C), temperature source Tympanic, weight 228 lb 6 oz (103.6 kg), SpO2 92 %. GENERAL:  Well developed, well nourished, woman sitting comfortably in a wheelchair in the exam room in no acute distress. MENTAL STATUS:  Alert and oriented to person, place and time.  HEAD:  Short brown hair.  Normocephalic, atraumatic, face symmetric, no Cushingoid features. EYES:  Blue eyes.  Pupils equal round and reactive to light and accomodation.  No conjunctivitis or scleral icterus. ENT:  Oropharynx clear without lesion.  Tongue normal. Mucous membranes moist.  RESPIRATORY:  Clear to auscultation without rales, wheezes or rhonchi. CARDIOVASCULAR:  Regular rate and rhythm without murmur, rub or gallop. ABDOMEN:  Soft, non-tender, with active bowel sounds, and no hepatosplenomegaly.  No masses. SKIN:  No rashes, ulcers or lesions. EXTREMITIES: No edema, no skin discoloration or tenderness.  No palpable cords. LYMPH NODES: No palpable cervical, supraclavicular, axillary or inguinal adenopathy  NEUROLOGICAL: Unremarkable. PSYCH:  Appropriate.  Eventually smiles during exam and discussion.   Appointment on 06/17/2017  Component Date Value Ref Range Status  . WBC 06/17/2017 7.2  3.6 - 11.0 K/uL Final  . RBC 06/17/2017 4.46  3.80 - 5.20 MIL/uL Final  . Hemoglobin 06/17/2017 11.0* 12.0 - 16.0 g/dL Final  . HCT 06/17/2017 34.3* 35.0 - 47.0 % Final  . MCV 06/17/2017 76.9* 80.0 - 100.0 fL Final  . MCH 06/17/2017 24.7* 26.0 - 34.0 pg Final  . MCHC 06/17/2017 32.1  32.0 - 36.0 g/dL Final  . RDW 06/17/2017 32.2* 11.5 - 14.5 % Final  . Platelets 06/17/2017 237  150 - 440 K/uL Final  . Neutrophils Relative % 06/17/2017 81  % Final  . Neutro Abs 06/17/2017 5.8  1.4 - 6.5 K/uL Final  . Lymphocytes Relative 06/17/2017 10  % Final  . Lymphs Abs 06/17/2017 0.7* 1.0 - 3.6 K/uL Final  . Monocytes Relative 06/17/2017 7  % Final  . Monocytes Absolute 06/17/2017 0.5  0.2 - 0.9 K/uL Final  . Eosinophils Relative  06/17/2017 1  % Final  . Eosinophils Absolute 06/17/2017 0.1  0 - 0.7 K/uL Final  . Basophils Relative 06/17/2017 1  % Final  . Basophils Absolute 06/17/2017 0.1  0 - 0.1 K/uL Final   Performed at [facility], [address] [place], [place] [address]    Assessment:  Cristina [name] is a 57 y.o. female with iron deficiency anemia and B12 deficiency.  Diet appears good.  She has ice pica and restless legs.  She was admitted to [facility] from 03/05/2017 - 03/07/2017 with symptomatic anemia.  Stool was hemoccult positive.  She received 2 units of PRBCs.  Hemoglobin improved from 5.5 to 7.8.  She is on oral iron BID.  She received 1 unit of PRBCs on 05/11/2017.  EGD on 03/06/2017 revealed a normal esophagus, stomach, and duodenum.  Colonoscopy on 04/23/2017 revealed four 6 to 8 mm polyps in the descending colon and in the ascending colon (tubular adenoma). There were three 6 to 10 mm polyps in the transverse colon (tubular adenoma).  There was one 6 mm polyp in the transverse colon (adenoma) and one 3 mm polyp (fecal material) in the transverse colon . There was one 12 mm polyp in the sigmoid colon (tubular adenoma).  Clips were placed.  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 There was also a single angioplastic lesion in the mid-jejunum. There was no active bleeding appreciated with direct visualization. No biopsies were taken. Lesions treated with argon plasma coagulation (APC).   Retrograde small bowel enteroscopy on 06/04/2017  demonstrated a normal ileum under direct visualization. There was no bleeding. No biopsies were taken. Recommendations were for anterograde push enteroscopy if patient experiences bleeding again.   Sleep  study on 05/31/2017 demonstrated and AHI score was 6.9 c/w mild OSAH syndrome. Patient with oxygen desaturations as low as 85%. CPAP titration found that optimal nocturnal PAP pressure found to be 13  cmH20.   Labs on 04/23/2017 revealed a ferritin of 3.  Iron saturation was 2% with a TIBC of 454 c/w iron deficiency.  Folate was 11.7.  B12 was 201.  Parietal cell antibody and intrinsic factor antibody were negative.  Urinalysis on 03/05/2017 revealed no hemoglobin or RBCs.  Creatinine was 0.97 on 03/05/2017.  Ferritin has been followed: 3 on 04/23/2017, 2 on 05/10/2017, and 31 on 06/17/2017.  She received Feraheme x 2 (05/17/2017, 05/24/2017).  Symptomatically, she is feeling better. She has more energy. Noted to be up doing more.  Exam is stable.    Plan:  1.  Labs today: CBC with diff, ferritin, B12 2.  Discuss GI consult. SBE (+) for 3 angiodysplastic lesions that were treated with APC. Any recurrence of re-bleeding will require anterograde push enteroscopy. Continue to follow up with Dr. Marius Ditch as already scheduled.  3.  Discuss recent PSG that demonstrated mild OSAH. Follow up CPAP titration study done. Patient continues on nocturnal PAP therapy, with an optimal pressure of 13 cmH20.    4.  Discuss B12 deficiency. Patient is taking oral B12 as prescribed.  5.  Discuss iron deficiency anemia. Ferritin pending.   6.  RTC in 6 weeks for labs (CBC with diff, ferritin). 7.  RTC in 3 months for MD assessment, labs (CBC with diff, ferritin - day before), and +/- Feraheme.   Honor Loh, NP  06/17/2017, 9:12 AM   I saw and evaluated the patient, participating in the key portions of the service and reviewing pertinent diagnostic studies and records.  I reviewed the nurse practitioner's note and agree with the findings and the plan.  The assessment and plan were discussed with the patient.  A few questions were asked by the patient and answered.   Nolon Stalls, MD 06/17/2017, 9:12 AM

## 2017-06-17 ENCOUNTER — Inpatient Hospital Stay: Payer: Medicare Other

## 2017-06-17 ENCOUNTER — Inpatient Hospital Stay (HOSPITAL_BASED_OUTPATIENT_CLINIC_OR_DEPARTMENT_OTHER): Payer: Medicare Other | Admitting: Hematology and Oncology

## 2017-06-17 ENCOUNTER — Encounter: Payer: Self-pay | Admitting: Hematology and Oncology

## 2017-06-17 VITALS — BP 119/79 | HR 69 | Temp 96.9°F | Wt 228.4 lb

## 2017-06-17 DIAGNOSIS — R5382 Chronic fatigue, unspecified: Secondary | ICD-10-CM | POA: Diagnosis not present

## 2017-06-17 DIAGNOSIS — R339 Retention of urine, unspecified: Secondary | ICD-10-CM

## 2017-06-17 DIAGNOSIS — J449 Chronic obstructive pulmonary disease, unspecified: Secondary | ICD-10-CM | POA: Diagnosis not present

## 2017-06-17 DIAGNOSIS — I10 Essential (primary) hypertension: Secondary | ICD-10-CM

## 2017-06-17 DIAGNOSIS — Z79899 Other long term (current) drug therapy: Secondary | ICD-10-CM | POA: Diagnosis not present

## 2017-06-17 DIAGNOSIS — L03221 Cellulitis of neck: Secondary | ICD-10-CM | POA: Diagnosis not present

## 2017-06-17 DIAGNOSIS — D500 Iron deficiency anemia secondary to blood loss (chronic): Secondary | ICD-10-CM

## 2017-06-17 DIAGNOSIS — D509 Iron deficiency anemia, unspecified: Secondary | ICD-10-CM

## 2017-06-17 DIAGNOSIS — E538 Deficiency of other specified B group vitamins: Secondary | ICD-10-CM

## 2017-06-17 LAB — CBC WITH DIFFERENTIAL/PLATELET
Basophils Absolute: 0.1 10*3/uL (ref 0–0.1)
Basophils Relative: 1 %
Eosinophils Absolute: 0.1 10*3/uL (ref 0–0.7)
Eosinophils Relative: 1 %
HCT: 34.3 % — ABNORMAL LOW (ref 35.0–47.0)
Hemoglobin: 11 g/dL — ABNORMAL LOW (ref 12.0–16.0)
Lymphocytes Relative: 10 %
Lymphs Abs: 0.7 10*3/uL — ABNORMAL LOW (ref 1.0–3.6)
MCH: 24.7 pg — ABNORMAL LOW (ref 26.0–34.0)
MCHC: 32.1 g/dL (ref 32.0–36.0)
MCV: 76.9 fL — ABNORMAL LOW (ref 80.0–100.0)
Monocytes Absolute: 0.5 10*3/uL (ref 0.2–0.9)
Monocytes Relative: 7 %
Neutro Abs: 5.8 10*3/uL (ref 1.4–6.5)
Neutrophils Relative %: 81 %
Platelets: 237 10*3/uL (ref 150–440)
RBC: 4.46 MIL/uL (ref 3.80–5.20)
RDW: 32.2 % — ABNORMAL HIGH (ref 11.5–14.5)
WBC: 7.2 10*3/uL (ref 3.6–11.0)

## 2017-06-17 LAB — FERRITIN: Ferritin: 31 ng/mL (ref 11–307)

## 2017-06-17 LAB — VITAMIN B12: Vitamin B-12: 477 pg/mL (ref 180–914)

## 2017-06-17 NOTE — Progress Notes (Signed)
Patient offers no concerns today. Patient states she is feeling much better.

## 2017-06-20 ENCOUNTER — Emergency Department: Payer: Medicare Other

## 2017-06-20 ENCOUNTER — Encounter: Payer: Self-pay | Admitting: Emergency Medicine

## 2017-06-20 ENCOUNTER — Inpatient Hospital Stay
Admission: EM | Admit: 2017-06-20 | Discharge: 2017-06-22 | DRG: 603 | Disposition: A | Discharge status: Home / Self Care | Payer: Medicare Other | Attending: Internal Medicine | Admitting: Internal Medicine

## 2017-06-20 ENCOUNTER — Other Ambulatory Visit: Payer: Self-pay

## 2017-06-20 DIAGNOSIS — Z794 Long term (current) use of insulin: Secondary | ICD-10-CM

## 2017-06-20 DIAGNOSIS — E785 Hyperlipidemia, unspecified: Secondary | ICD-10-CM | POA: Diagnosis present

## 2017-06-20 DIAGNOSIS — G4730 Sleep apnea, unspecified: Secondary | ICD-10-CM | POA: Diagnosis present

## 2017-06-20 DIAGNOSIS — F259 Schizoaffective disorder, unspecified: Secondary | ICD-10-CM | POA: Diagnosis present

## 2017-06-20 DIAGNOSIS — E1140 Type 2 diabetes mellitus with diabetic neuropathy, unspecified: Secondary | ICD-10-CM | POA: Diagnosis present

## 2017-06-20 DIAGNOSIS — I110 Hypertensive heart disease with heart failure: Secondary | ICD-10-CM | POA: Diagnosis present

## 2017-06-20 DIAGNOSIS — E669 Obesity, unspecified: Secondary | ICD-10-CM | POA: Diagnosis present

## 2017-06-20 DIAGNOSIS — Z6838 Body mass index (BMI) 38.0-38.9, adult: Secondary | ICD-10-CM

## 2017-06-20 DIAGNOSIS — L03221 Cellulitis of neck: Principal | ICD-10-CM | POA: Diagnosis present

## 2017-06-20 DIAGNOSIS — Z7951 Long term (current) use of inhaled steroids: Secondary | ICD-10-CM | POA: Diagnosis not present

## 2017-06-20 DIAGNOSIS — Z8673 Personal history of transient ischemic attack (TIA), and cerebral infarction without residual deficits: Secondary | ICD-10-CM | POA: Diagnosis not present

## 2017-06-20 DIAGNOSIS — Z7982 Long term (current) use of aspirin: Secondary | ICD-10-CM

## 2017-06-20 DIAGNOSIS — F17210 Nicotine dependence, cigarettes, uncomplicated: Secondary | ICD-10-CM | POA: Diagnosis present

## 2017-06-20 DIAGNOSIS — K219 Gastro-esophageal reflux disease without esophagitis: Secondary | ICD-10-CM | POA: Diagnosis present

## 2017-06-20 DIAGNOSIS — F419 Anxiety disorder, unspecified: Secondary | ICD-10-CM | POA: Diagnosis present

## 2017-06-20 DIAGNOSIS — J439 Emphysema, unspecified: Secondary | ICD-10-CM | POA: Diagnosis present

## 2017-06-20 DIAGNOSIS — Z79899 Other long term (current) drug therapy: Secondary | ICD-10-CM

## 2017-06-20 DIAGNOSIS — I5032 Chronic diastolic (congestive) heart failure: Secondary | ICD-10-CM | POA: Diagnosis present

## 2017-06-20 DIAGNOSIS — D509 Iron deficiency anemia, unspecified: Secondary | ICD-10-CM | POA: Diagnosis present

## 2017-06-20 DIAGNOSIS — L304 Erythema intertrigo: Secondary | ICD-10-CM | POA: Diagnosis present

## 2017-06-20 DIAGNOSIS — F329 Major depressive disorder, single episode, unspecified: Secondary | ICD-10-CM | POA: Diagnosis present

## 2017-06-20 LAB — CBC WITH DIFFERENTIAL/PLATELET
Basophils Absolute: 0.1 10*3/uL (ref 0–0.1)
Basophils Relative: 1 %
Eosinophils Absolute: 0.1 10*3/uL (ref 0–0.7)
Eosinophils Relative: 1 %
HCT: 35.1 % (ref 35.0–47.0)
Hemoglobin: 11.1 g/dL — ABNORMAL LOW (ref 12.0–16.0)
Lymphocytes Relative: 6 %
Lymphs Abs: 0.7 10*3/uL — ABNORMAL LOW (ref 1.0–3.6)
MCH: 24.5 pg — ABNORMAL LOW (ref 26.0–34.0)
MCHC: 31.5 g/dL — ABNORMAL LOW (ref 32.0–36.0)
MCV: 77.8 fL — ABNORMAL LOW (ref 80.0–100.0)
Monocytes Absolute: 1.1 10*3/uL — ABNORMAL HIGH (ref 0.2–0.9)
Monocytes Relative: 8 %
Neutro Abs: 11.6 10*3/uL — ABNORMAL HIGH (ref 1.4–6.5)
Neutrophils Relative %: 86 %
Platelets: 252 10*3/uL (ref 150–440)
RBC: 4.51 MIL/uL (ref 3.80–5.20)
RDW: 31 % — ABNORMAL HIGH (ref 11.5–14.5)
WBC: 13.5 10*3/uL — ABNORMAL HIGH (ref 3.6–11.0)

## 2017-06-20 LAB — COMPREHENSIVE METABOLIC PANEL
ALT: 12 U/L — ABNORMAL LOW (ref 14–54)
AST: 13 U/L — ABNORMAL LOW (ref 15–41)
Albumin: 3.6 g/dL (ref 3.5–5.0)
Alkaline Phosphatase: 105 U/L (ref 38–126)
Anion gap: 6 (ref 5–15)
BUN: 8 mg/dL (ref 6–20)
CO2: 32 mmol/L (ref 22–32)
Calcium: 9 mg/dL (ref 8.9–10.3)
Chloride: 97 mmol/L — ABNORMAL LOW (ref 101–111)
Creatinine, Ser: 0.6 mg/dL (ref 0.44–1.00)
GFR calc Af Amer: >60 mL/min (ref >60)
GFR calc non Af Amer: >60 mL/min (ref >60)
Glucose, Bld: 175 mg/dL — ABNORMAL HIGH (ref 65–99)
Potassium: 3.7 mmol/L (ref 3.5–5.1)
Sodium: 135 mmol/L (ref 135–145)
Total Bilirubin: 0.5 mg/dL (ref 0.3–1.2)
Total Protein: 7 g/dL (ref 6.5–8.1)

## 2017-06-20 LAB — LACTIC ACID, PLASMA: Lactic Acid, Venous: 1 mmol/L (ref 0.5–1.9)

## 2017-06-20 MED ORDER — CLONAZEPAM 0.5 MG PO TABS
0.5000 mg | ORAL_TABLET | Freq: Two times a day (BID) | ORAL | Status: DC
  Administered 2017-06-21 – 2017-06-22 (×4): 0.5 mg via ORAL
  Filled 2017-06-20 (×4): qty 1

## 2017-06-20 MED ORDER — BUSPIRONE HCL 5 MG PO TABS
10.0000 mg | ORAL_TABLET | Freq: Two times a day (BID) | ORAL | Status: DC
  Administered 2017-06-21 – 2017-06-22 (×4): 10 mg via ORAL
  Filled 2017-06-20 (×4): qty 2

## 2017-06-20 MED ORDER — VENLAFAXINE HCL ER 75 MG PO CP24
225.0000 mg | ORAL_CAPSULE | Freq: Every day | ORAL | Status: DC
  Administered 2017-06-21 – 2017-06-22 (×2): 225 mg via ORAL
  Filled 2017-06-20 (×2): qty 3

## 2017-06-20 MED ORDER — FENTANYL CITRATE (PF) 100 MCG/2ML IJ SOLN
50.0000 ug | Freq: Once | INTRAMUSCULAR | Status: AC
  Administered 2017-06-20: 50 ug via INTRAVENOUS
  Filled 2017-06-20: qty 2

## 2017-06-20 MED ORDER — INSULIN ASPART 100 UNIT/ML ~~LOC~~ SOLN
0.0000 [IU] | Freq: Every day | SUBCUTANEOUS | Status: DC
  Administered 2017-06-20: 23:00:00 2 [IU] via SUBCUTANEOUS
  Filled 2017-06-20: qty 1

## 2017-06-20 MED ORDER — SODIUM CHLORIDE 0.9 % IV BOLUS
1000.0000 mL | Freq: Once | INTRAVENOUS | Status: AC
  Administered 2017-06-20: 1000 mL via INTRAVENOUS

## 2017-06-20 MED ORDER — PIPERACILLIN-TAZOBACTAM 3.375 G IVPB 30 MIN
3.3750 g | Freq: Once | INTRAVENOUS | Status: AC
  Administered 2017-06-20: 3.375 g via INTRAVENOUS
  Filled 2017-06-20: qty 50

## 2017-06-20 MED ORDER — FERROUS SULFATE 325 (65 FE) MG PO TABS
325.0000 mg | ORAL_TABLET | Freq: Two times a day (BID) | ORAL | Status: DC
  Administered 2017-06-21 – 2017-06-22 (×4): 325 mg via ORAL
  Filled 2017-06-20 (×4): qty 1

## 2017-06-20 MED ORDER — VENLAFAXINE HCL ER 75 MG PO CP24: 75.0000 mg | ORAL_CAPSULE | Freq: Every day | ORAL | Status: DC

## 2017-06-20 MED ORDER — ACETAMINOPHEN 325 MG PO TABS
650.0000 mg | ORAL_TABLET | Freq: Four times a day (QID) | ORAL | Status: DC | PRN
  Administered 2017-06-21 (×2): 650 mg via ORAL
  Filled 2017-06-20 (×2): qty 2

## 2017-06-20 MED ORDER — POTASSIUM CHLORIDE CRYS ER 10 MEQ PO TBCR
10.0000 meq | EXTENDED_RELEASE_TABLET | Freq: Every day | ORAL | Status: DC
  Administered 2017-06-21 – 2017-06-22 (×2): 10 meq via ORAL
  Filled 2017-06-20 (×2): qty 1

## 2017-06-20 MED ORDER — VITAMIN B-12 1000 MCG PO TABS
1000.0000 ug | ORAL_TABLET | Freq: Every day | ORAL | Status: DC
  Administered 2017-06-21 – 2017-06-22 (×2): 1000 ug via ORAL
  Filled 2017-06-20 (×2): qty 1

## 2017-06-20 MED ORDER — NITROGLYCERIN 0.3 MG SL SUBL: 0.3000 mg | SUBLINGUAL_TABLET | SUBLINGUAL | Status: DC | PRN

## 2017-06-20 MED ORDER — DIPHENOXYLATE-ATROPINE 2.5-0.025 MG PO TABS: 1.0000 | ORAL_TABLET | Freq: Every day | ORAL | Status: DC | PRN

## 2017-06-20 MED ORDER — IOPAMIDOL (ISOVUE-370) INJECTION 76%
75.0000 mL | Freq: Once | INTRAVENOUS | Status: AC | PRN
  Administered 2017-06-20: 75 mL via INTRAVENOUS

## 2017-06-20 MED ORDER — UMECLIDINIUM-VILANTEROL 62.5-25 MCG/INH IN AEPB
1.0000 | INHALATION_SPRAY | Freq: Every day | RESPIRATORY_TRACT | Status: DC
  Administered 2017-06-21 – 2017-06-22 (×2): 1 via RESPIRATORY_TRACT
  Filled 2017-06-20: qty 14

## 2017-06-20 MED ORDER — ASPIRIN EC 81 MG PO TBEC
81.0000 mg | DELAYED_RELEASE_TABLET | Freq: Every day | ORAL | Status: DC
  Administered 2017-06-21 – 2017-06-22 (×2): 81 mg via ORAL
  Filled 2017-06-20 (×2): qty 1

## 2017-06-20 MED ORDER — ATORVASTATIN CALCIUM 20 MG PO TABS
20.0000 mg | ORAL_TABLET | Freq: Every day | ORAL | Status: DC
  Administered 2017-06-21 (×2): 20 mg via ORAL
  Filled 2017-06-20 (×2): qty 1

## 2017-06-20 MED ORDER — ONDANSETRON HCL 4 MG PO TABS: 4.0000 mg | ORAL_TABLET | Freq: Four times a day (QID) | ORAL | Status: DC | PRN

## 2017-06-20 MED ORDER — VANCOMYCIN HCL IN DEXTROSE 1-5 GM/200ML-% IV SOLN
1000.0000 mg | Freq: Two times a day (BID) | INTRAVENOUS | Status: DC
  Administered 2017-06-21 – 2017-06-22 (×3): 1000 mg via INTRAVENOUS
  Filled 2017-06-20 (×5): qty 200

## 2017-06-20 MED ORDER — ACETAMINOPHEN 650 MG RE SUPP: 650.0000 mg | Freq: Four times a day (QID) | RECTAL | Status: DC | PRN

## 2017-06-20 MED ORDER — ENOXAPARIN SODIUM 40 MG/0.4ML ~~LOC~~ SOLN: 40.0000 mg | SUBCUTANEOUS | Status: DC

## 2017-06-20 MED ORDER — NITROGLYCERIN 0.4 MG SL SUBL: 0.4000 mg | SUBLINGUAL_TABLET | SUBLINGUAL | Status: DC | PRN

## 2017-06-20 MED ORDER — FUROSEMIDE 40 MG PO TABS
20.0000 mg | ORAL_TABLET | Freq: Two times a day (BID) | ORAL | Status: DC
  Administered 2017-06-21 – 2017-06-22 (×3): 20 mg via ORAL
  Filled 2017-06-20 (×3): qty 1

## 2017-06-20 MED ORDER — ONDANSETRON HCL 4 MG/2ML IJ SOLN: 4.0000 mg | Freq: Four times a day (QID) | INTRAMUSCULAR | Status: DC | PRN

## 2017-06-20 MED ORDER — VANCOMYCIN HCL IN DEXTROSE 1-5 GM/200ML-% IV SOLN
1000.0000 mg | Freq: Once | INTRAVENOUS | Status: AC
  Administered 2017-06-20: 1000 mg via INTRAVENOUS
  Filled 2017-06-20: qty 200

## 2017-06-20 MED ORDER — INSULIN ASPART 100 UNIT/ML ~~LOC~~ SOLN
0.0000 [IU] | Freq: Three times a day (TID) | SUBCUTANEOUS | Status: DC
  Administered 2017-06-21 (×2): 3 [IU] via SUBCUTANEOUS
  Administered 2017-06-21: 4 [IU] via SUBCUTANEOUS
  Administered 2017-06-22: 7 [IU] via SUBCUTANEOUS
  Filled 2017-06-20 (×3): qty 1

## 2017-06-20 MED ORDER — ONDANSETRON HCL 4 MG/2ML IJ SOLN
4.0000 mg | Freq: Once | INTRAMUSCULAR | Status: AC
  Administered 2017-06-20: 4 mg via INTRAVENOUS
  Filled 2017-06-20: qty 2

## 2017-06-20 MED ORDER — METOPROLOL TARTRATE 25 MG PO TABS
12.5000 mg | ORAL_TABLET | Freq: Two times a day (BID) | ORAL | Status: DC
  Administered 2017-06-21 – 2017-06-22 (×4): 12.5 mg via ORAL
  Filled 2017-06-20 (×4): qty 1

## 2017-06-20 MED ORDER — PREGABALIN 50 MG PO CAPS
100.0000 mg | ORAL_CAPSULE | Freq: Three times a day (TID) | ORAL | Status: DC
  Administered 2017-06-21 – 2017-06-22 (×5): 100 mg via ORAL
  Filled 2017-06-20 (×5): qty 2

## 2017-06-20 MED ORDER — PANTOPRAZOLE SODIUM 40 MG PO TBEC
40.0000 mg | DELAYED_RELEASE_TABLET | Freq: Two times a day (BID) | ORAL | Status: DC
  Administered 2017-06-21 – 2017-06-22 (×4): 40 mg via ORAL
  Filled 2017-06-20 (×4): qty 1

## 2017-06-20 NOTE — ED Notes (Signed)
Daughter at bedside.

## 2017-06-20 NOTE — ED Triage Notes (Addendum)
Pt reports insect bite to left side of neck 2 days ago, states the redness is moving down towards her chest. Pt reports area is painful. Pt  Lives at [facility]

## 2017-06-20 NOTE — H&P (Signed)
[facility] NAME: Cristina [name]    MR#:  [id number]  DATE OF BIRTH:  11-25-1960  DATE OF ADMISSION:  06/20/2017  PRIMARY CARE PHYSICIAN: System, Pcp Not In   REQUESTING/REFERRING PHYSICIAN: Dr. Larae Grooms  CHIEF COMPLAINT:   Chief Complaint  Patient presents with  . Insect Bite  . Cellulitis    HISTORY OF PRESENT ILLNESS:  Cristina [name]  is a 57 y.o. female with a known history of schizoaffective disorder, hypertension, obesity, hyperlipidemia, diabetes, COPD, iron deficiency anemia, GI bleed secondary to AVMs  Who presents to the hospital due to left-sided neck swelling redness and pain.  Patient says she noticed something like a bug bite/swelling in the back of her neck a couple days ago and since then she has developed progressive swelling and redness to the left side of her neck radiating down to the anterior part of her chest.  She resides at an assisted living who sent her to the ER for further evaluation.  Patient was noted to have a cellulitis of her left side of her neck radiating to her upper chest and therefore hospitalist services were contacted for treatment evaluation.  Patient admits to some chills but no documented fever, she denies any nausea vomiting, abdominal pain.  She admits to some intermittent chest pain which is chronic for her for which she takes nitroglycerin.  She denies any other associated symptoms.  Hospitalist services were contacted for treatment evaluation.  PAST MEDICAL HISTORY:   Past Medical History:  Diagnosis Date  . Abdominal hernia   . Arthritis   . Asthma   . Bladder dysfunction   . Carpal tunnel syndrome   . CHF (congestive heart failure) ([place])   . COPD (chronic obstructive pulmonary disease) ([place])   . DDD (degenerative disc disease), lumbar   . Diabetes mellitus without complication ([place])   . Dyspnea   . Edema   . Fatty tumor   . GERD (gastroesophageal reflux disease)   . H/O  colonoscopy   . H/O tubal ligation    hx btl  . History of cholecystectomy    hx of cholecystectomy  . HLD (hyperlipidemia)   . Hypertension   . Muscle cramps   . Neuropathy   . Obesity   . Recurrent boils   . Schizoaffective disorder ([place])   . Sleep apnea   . Stroke ([place])   . UTI (lower urinary tract infection)   . Vaginitis     PAST SURGICAL HISTORY:   Past Surgical History:  Procedure Laterality Date  . CARDIAC CATHETERIZATION Left 09/24/2014   Procedure: Left Heart Cath and Coronary Angiography;  Surgeon: Dionisio David, MD;  Location: [facility];  Service: Cardiovascular;  Laterality: Left;  . CHOLECYSTECTOMY    . COLONOSCOPY WITH PROPOFOL N/A 08/12/2015   Procedure: COLONOSCOPY WITH PROPOFOL;  Surgeon: Lollie Sails, MD;  Location: [facility] ENDOSCOPY;  Service: Endoscopy;  Laterality: N/A;  . COLONOSCOPY WITH PROPOFOL N/A 04/23/2017   Procedure: COLONOSCOPY WITH PROPOFOL;  Surgeon: Jonathon Bellows, MD;  Location: [facility] ENDOSCOPY;  Service: Endoscopy;  Laterality: N/A;  . ENTEROSCOPY N/A 05/28/2017   Procedure: SMALL BOWEL ENTEROSCOPY;  Surgeon: Lin Landsman, MD;  Location: [facility] ENDOSCOPY;  Service: Gastroenterology;  Laterality: N/A;  . ENTEROSCOPY N/A 06/04/2017   Procedure: RETROGRADE SMALL BOWEL ENTEROSCOPY;  Surgeon: Lin Landsman, MD;  Location: [facility] ENDOSCOPY;  Service: Gastroenterology;  Laterality: N/A;  . ESOPHAGOGASTRODUODENOSCOPY (EGD) WITH PROPOFOL N/A 08/12/2015  Procedure: ESOPHAGOGASTRODUODENOSCOPY (EGD) WITH PROPOFOL;  Surgeon: Lollie Sails, MD;  Location: [facility] ENDOSCOPY;  Service: Endoscopy;  Laterality: N/A;  . ESOPHAGOGASTRODUODENOSCOPY (EGD) WITH PROPOFOL N/A 03/06/2017   Procedure: ESOPHAGOGASTRODUODENOSCOPY (EGD) WITH PROPOFOL;  Surgeon: Lucilla Lame, MD;  Location: [facility] ENDOSCOPY;  Service: Endoscopy;  Laterality: N/A;  . GIVENS CAPSULE STUDY N/A 05/25/2017   Procedure: GIVENS CAPSULE STUDY;  Surgeon: Lucilla Lame, MD;  Location: [facility]  ENDOSCOPY;  Service: Endoscopy;  Laterality: N/A;  . tubal ligation      SOCIAL HISTORY:   Social History   Tobacco Use  . Smoking status: Current Some Day Smoker    Packs/day: 0.25    Years: 40.00    Pack years: 10.00    Types: Cigarettes  . Smokeless tobacco: Former Systems developer    Types: Chew  Substance Use Topics  . Alcohol use: No    Alcohol/week: 0.0 oz    FAMILY HISTORY:   Family History  Problem Relation Age of Onset  . CAD Mother   . Lung cancer Mother   . Mental illness Mother   . Mental illness Father 58       suicided   . Mental illness Sister   . CAD Sister   . Mental illness Brother   . CAD Sister     DRUG ALLERGIES:   Allergies  Allergen Reactions  . Bee Venom Anaphylaxis and Shortness Of Breath    Stated by Patient  . Shellfish Allergy Anaphylaxis and Shortness Of Breath    Stated by Patient Stated by Patient   . Shellfish-Derived Products Anaphylaxis and Shortness Of Breath    Stated by Patient  . Codeine   . Thorazine [Chlorpromazine] Swelling  . Tylenol With Codeine #3 [Acetaminophen-Codeine] Other (See Comments)    Hallucinations  . Metronidazole Rash    REVIEW OF SYSTEMS:   Review of Systems  Constitutional: Negative for chills, fever and weight loss.  HENT: Negative for congestion, nosebleeds and tinnitus.   Eyes: Negative for blurred vision, double vision and redness.  Respiratory: Negative for cough, hemoptysis, shortness of breath and wheezing.   Cardiovascular: Negative for chest pain, orthopnea, leg swelling and PND.  Gastrointestinal: Negative for abdominal pain, diarrhea, melena, nausea and vomiting.  Genitourinary: Negative for dysuria, hematuria and urgency.  Musculoskeletal: Negative for falls and joint pain.  Neurological: Negative for dizziness, tingling, sensory change, focal weakness, seizures, weakness and headaches.  Endo/Heme/Allergies: Negative for polydipsia. Does not bruise/bleed easily.  Psychiatric/Behavioral:  Negative for depression and memory loss. The patient is not nervous/anxious.   All other systems reviewed and are negative.   MEDICATIONS AT HOME:   Prior to Admission medications   Medication Sig Start Date End Date Taking? Authorizing Provider  acetaminophen (TYLENOL) 500 MG tablet Take 500 mg by mouth every 8 (eight) hours as needed for mild pain or moderate pain.    [provider]  aspirin EC 81 MG tablet Take 81 mg by mouth daily.    [provider]  atorvastatin (LIPITOR) 20 MG tablet Take 20 mg by mouth at bedtime.    [provider]  busPIRone (BUSPAR) 10 MG tablet Take 10 mg by mouth 2 (two) times daily.     [provider]  clonazePAM (KLONOPIN) 0.5 MG tablet Take 1 tablet (0.5 mg total) by mouth 2 (two) times daily. Patient taking differently: Take 0.5 mg by mouth daily.  07/18/16   Gouru, Illene Silver, MD  Cyanocobalamin (B-12) 1000 MCG CAPS Take 1,000 mcg by mouth daily.  05/10/17   Karen Kitchens, NP  diphenoxylate-atropine (LOMOTIL) 2.5-0.025 MG tablet Take 1 tablet by mouth daily as needed for diarrhea or loose stools.    [provider]  ferrous sulfate 325 (65 FE) MG EC tablet Take 1 tablet (325 mg total) by mouth 2 (two) times daily. 03/07/17 06/17/17  Hillary Bow, MD  furosemide (LASIX) 20 MG tablet Take 1 tablet (20 mg total) by mouth 2 (two) times daily. Patient taking differently: Take 20 mg by mouth daily.  07/18/16   Nicholes Mango, MD  haloperidol decanoate (HALDOL DECANOATE) 100 MG/ML injection Inject 1 mL (100 mg total) into the muscle every 30 (thirty) days. 08/02/15   Hildred Priest, MD  insulin aspart (NOVOLOG) 100 UNIT/ML injection Inject 100 Units into the skin 3 (three) times daily before meals.    [provider]  Insulin Pen Needle (FIFTY50 PEN NEEDLES) 31G X 8 MM MISC PEN NEEDLES 31G X 6 MM MISC 10/07/12   [provider]  loperamide (IMODIUM A-D) 2 MG tablet Take 2 mg by mouth 4 (four) times daily as  needed for diarrhea or loose stools.    [provider]  menthol-cetylpyridinium (CEPACOL) 3 MG lozenge Take 1 lozenge by mouth as needed for sore throat.    [provider]  metFORMIN (GLUCOPHAGE-XR) 500 MG 24 hr tablet Take 1,000 mg by mouth 2 (two) times daily.  05/23/15   [provider]  metoprolol tartrate (LOPRESSOR) 25 MG tablet Take 0.5 tablets (12.5 mg total) by mouth 2 (two) times daily. Patient taking differently: Take 50 mg by mouth 2 (two) times daily.  07/18/16   Nicholes Mango, MD  nitroGLYCERIN (NITROSTAT) 0.3 MG SL tablet Place 0.3 mg under the tongue every 5 (five) minutes as needed for chest pain.    [provider]  ondansetron (ZOFRAN) 4 MG tablet Take 4 mg by mouth every 6 (six) hours as needed for nausea or vomiting.    [provider]  pantoprazole (PROTONIX) 40 MG tablet Take 40 mg by mouth 2 (two) times daily.    [provider]  potassium chloride 20 MEQ TBCR Take 10 mEq by mouth daily. 07/18/16   Gouru, Illene Silver, MD  pregabalin (LYRICA) 100 MG capsule Take 100 mg by mouth 3 (three) times daily.    [provider]  umeclidinium-vilanterol (ANORO ELLIPTA) 62.5-25 MCG/INH AEPB Inhale 1 puff into the lungs daily.    [provider]  venlafaxine XR (EFFEXOR-XR) 150 MG 24 hr capsule Take 225 mg by mouth daily with breakfast.    [provider]  venlafaxine XR (EFFEXOR-XR) 75 MG 24 hr capsule Take 75 mg by mouth daily with breakfast.     [provider]      VITAL SIGNS:  Blood pressure 135/72, pulse 95, temperature 99.7 F (37.6 C), temperature source Oral, resp. rate 16, height 4\' 11"  (1.499 m), weight 103.4 kg (228 lb), SpO2 98 %.  PHYSICAL EXAMINATION:  Physical Exam  GENERAL:  57 y.o.-year-old obese patient lying in the bed in no acute distress.  EYES: Pupils equal, round, reactive to light and accommodation. No scleral icterus. Extraocular muscles intact.  HEENT: Head atraumatic,  normocephalic. Oropharynx and nasopharynx clear. No oropharyngeal erythema, moist oral mucosa  NECK: Indurated area to the posterior part of her neck on the left side, with redness radiating down the left side of her neck into the anterior chest.  no jugular venous distention. No thyroid enlargement LUNGS: Normal breath sounds bilaterally, no wheezing,  rales, rhonchi. No use of accessory muscles of respiration.  CARDIOVASCULAR: S1, S2 RRR. No murmurs, rubs, gallops, clicks.  ABDOMEN: Soft, nontender, nondistended. Bowel sounds present. No organomegaly or mass.  EXTREMITIES: No pedal edema, cyanosis, or clubbing. + 2 pedal & radial pulses b/l.   NEUROLOGIC: Cranial nerves II through XII are intact. No focal Motor or sensory deficits appreciated b/l PSYCHIATRIC: The patient is alert and oriented x 3.  SKIN: No obvious rash, lesion, or ulcer.  Cellulitis of left side of her neck.   LABORATORY PANEL:   CBC Recent Labs  Lab 06/20/17 1500  WBC 13.5*  HGB 11.1*  HCT 35.1  PLT 252   ------------------------------------------------------------------------------------------------------------------  Chemistries  Recent Labs  Lab 06/20/17 1500  NA 135  K 3.7  CL 97*  CO2 32  GLUCOSE 175*  BUN 8  CREATININE 0.60  CALCIUM 9.0  AST 13*  ALT 12*  ALKPHOS 105  BILITOT 0.5   ------------------------------------------------------------------------------------------------------------------  Cardiac Enzymes No results for input(s): TROPONINI in the last 168 hours. ------------------------------------------------------------------------------------------------------------------  RADIOLOGY:  Ct Soft Tissue Neck W Contrast  Result Date: 06/20/2017 CLINICAL DATA:  Initial evaluation for acute pain and swelling at left neck extending towards chest. EXAM: CT NECK WITH CONTRAST TECHNIQUE: Multidetector CT imaging of the neck was performed using the standard protocol following the bolus  administration of intravenous contrast. CONTRAST:  26mL ISOVUE-370 IOPAMIDOL (ISOVUE-370) INJECTION 76% COMPARISON:  None. FINDINGS: Pharynx and larynx: Oral cavity within normal limits without mass lesion or loculated fluid collection. Oropharynx and nasopharynx within normal limits. No retropharyngeal collection. Remainder of the hypopharynx and supraglottic larynx within normal limits. Epiglottis normal. Vallecula clear. True cords apposed and not well evaluated. Subglottic airway clear. Salivary glands: Salivary glands including the parotid and submandibular glands are normal. Thyroid: Thyroid normal. Lymph nodes: Few mildly prominent nodes within the left posterior neck measure up to 7 mm, likely reactive. No other adenopathy within the neck. Vascular: Normal intravascular enhancement seen throughout the neck. Atherosclerotic change present about the aortic arch and carotid bifurcations. Limited intracranial: Unremarkable. Visualized orbits: Visualized globes within normal limits. 16 mm well-circumscribed hyperdense lesion at the posterior aspect of the left orbit noted, indeterminate, but grossly similar relative to previous scans dating back to 2017. Orbital soft tissues otherwise unremarkable. Mastoids and visualized paranasal sinuses: Paranasal sinuses and mastoid air cells are clear. Middle ear cavities are well pneumatized and clear. Skeleton: No acute osseous abnormality. No worrisome lytic or blastic osseous lesions. Upper chest: Visualized upper mediastinum within normal limits. Visualized lungs are clear. Other: Asymmetric soft tissue swelling with inflammatory stranding present within the subcutaneous fat of the left posterior neck (series 2, image 31). Overlying skin thickening. Extension into the underlying left posterior paraspinous musculature without deeper extension into the neck at this time. Inferior extension down the lateral left neck into the upper left anterior chest wall (series 2, image  74). Findings concerning for infection/cellulitis. Slightly more confluent area of focal phlegmon noted without definite discrete abscess or drainable fluid collection (series 2, image 31). Few small reactive lymph nodes noted within this region. IMPRESSION: 1. Soft tissue swelling with inflammatory stranding and skin thickening in the left posterior neck, extending inferiorly down the left neck into the upper left anterior chest wall. Findings concerning for acute infection/cellulitis. No discrete abscess or drainable fluid collection at this time. Extension into the underlying left posterior paraspinous musculature without deeper extension into the neck. 2. 16 mm well-circumscribed intraorbital lesion at the posterior left orbit,  indeterminate, but grossly similar as compared to prior CTs dating back to 2017. Follow-up examination with nonemergent MRI suggested for further characterization. Electronically Signed   By: Jeannine Boga M.D.   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  1.  Left neck cellulitis-this is a cause of patient's worsening swelling of her left side of her neck with redness. - Looks like patient had a folliculitis of the posterior part of her neck which has progressed now to cellulitis.  Patient CT scan of her neck does not suggest any abscess formation or fluid collection that needs drainage. - We will treat the patient with IV vancomycin, follow clinically.  Follow cultures. - if not improving with supportive consider ENT  2.  Leukocytosis-secondary to the cellulitis.  Will follow with IV antibiotic therapy.  3.  Diabetes type 2 without complication- we will place patient on sliding  scale insulin, follow blood sugars.  Hold metformin for now.  Next  4.  Essential hypertension-continue metoprolol.  5.  Anxiety/depression-continue Klonopin, BuSpar, Effexor.  6.  GERD-continue Protonix.  7.  Diabetic neuropathy-continue Lyrica.  8.  Chronic iron deficiency anemia-hemoglobin stable.  Continue iron supplements.  9.  Hyperlipidemia-continue atorvastatin.    All the records are reviewed and case discussed with ED provider. Management plans discussed with the patient, family and they are in agreement.  CODE STATUS: Full code  TOTAL TIME TAKING CARE OF THIS PATIENT: 45 minutes.    Henreitta Leber M.D on 06/20/2017 at 8:54 PM  Between 7am to 6pm - Pager - [phone]  After 6pm go to [email or web address] - password [email or web address] [place] [facility]  Office  [phone]  CC: Primary care physician; System, Pcp Not In

## 2017-06-20 NOTE — ED Notes (Signed)
ED TO INPATIENT HANDOFF REPORT  Name/Age/Gender Cristina [name] 57 y.o. female  Code Status Code Status History    Date Active Date Inactive Code Status Order ID Comments User Context   03/06/2017 0145 03/07/2017 1830 Full Code [id number]  Saundra Shelling, MD Inpatient   07/15/2016 0748 07/18/2016 1653 Full Code [id number]  Bettey Costa, MD Inpatient   10/28/2015 2340 10/31/2015 1852 Full Code [id number]  Holley Raring, NP ED   07/30/2015 2333 08/03/2015 1541 Full Code [id number]  Clapacs, Madie Reno, MD Inpatient   12/05/2014 0225 12/07/2014 2038 Full Code [id number]  Hildred Priest, MD Inpatient   09/24/2014 0918 09/24/2014 1737 Full Code [id number]  Dionisio David, MD Inpatient   09/23/2014 1958 09/24/2014 0918 Full Code [id number]  Dustin Flock, MD Inpatient      Home/SNF/Other assisted living  Chief Complaint Abscess  Level of Care/Admitting Diagnosis ED Disposition    ED Disposition Condition [place]: [place] [[id number]]  Level of Care: Med-Surg [16]  Diagnosis: Cellulitis of neck [250539]  Admitting Physician: Henreitta Leber [[id number]]  Attending Physician: Henreitta Leber [[id number]]  Estimated length of stay: past midnight tomorrow  Certification:: I certify this patient will need inpatient services for at least 2 midnights  PT Class (Do Not Modify): Inpatient [101]  PT Acc Code (Do Not Modify): Private [1]       Medical History Past Medical History:  Diagnosis Date  . Abdominal hernia   . Arthritis   . Asthma   . Bladder dysfunction   . Carpal tunnel syndrome   . CHF (congestive heart failure) ([place])   . COPD (chronic obstructive pulmonary disease) ([place])   . DDD (degenerative disc disease), lumbar   . Diabetes mellitus without complication ([place])   . Dyspnea   . Edema   . Fatty tumor   . GERD (gastroesophageal reflux disease)   . H/O colonoscopy   . H/O tubal ligation    hx btl  . History of cholecystectomy    hx of  cholecystectomy  . HLD (hyperlipidemia)   . Hypertension   . Muscle cramps   . Neuropathy   . Obesity   . Recurrent boils   . Schizoaffective disorder ([place])   . Sleep apnea   . Stroke ([place])   . UTI (lower urinary tract infection)   . Vaginitis     Allergies Allergies  Allergen Reactions  . Bee Venom Anaphylaxis and Shortness Of Breath    Stated by Patient  . Shellfish Allergy Anaphylaxis and Shortness Of Breath    Stated by Patient Stated by Patient   . Shellfish-Derived Products Anaphylaxis and Shortness Of Breath    Stated by Patient  . Codeine   . Thorazine [Chlorpromazine] Swelling  . Tylenol With Codeine #3 [Acetaminophen-Codeine] Other (See Comments)    Hallucinations  . Metronidazole Rash    IV Location/Drains/Wounds Patient Lines/Drains/Airways Status   Active Line/Drains/Airways    Name:   Placement date:   Placement time:   Site:   Days:   Peripheral IV 06/20/17 Left Antecubital   06/20/17    1500    Antecubital   less than 1   Airway   05/28/17    1230     23          Labs/Imaging Results for orders placed or performed during the hospital encounter of 06/20/17 (from the past 48 hour(s))  Lactic acid, plasma     Status: None  Collection Time: 06/20/17  2:59 PM  Result Value Ref Range   Lactic Acid, Venous 1.0 0.5 - 1.9 mmol/L    Comment: Performed at [facility], [address] [place], [place] [address]  Comprehensive metabolic panel     Status: Abnormal   Collection Time: 06/20/17  3:00 PM  Result Value Ref Range   Sodium 135 135 - 145 mmol/L   Potassium 3.7 3.5 - 5.1 mmol/L   Chloride 97 (L) 101 - 111 mmol/L   CO2 32 22 - 32 mmol/L   Glucose, Bld 175 (H) 65 - 99 mg/dL   BUN 8 6 - 20 mg/dL   Creatinine, Ser 0.60 0.44 - 1.00 mg/dL   Calcium 9.0 8.9 - 10.3 mg/dL   Total Protein 7.0 6.5 - 8.1 g/dL   Albumin 3.6 3.5 - 5.0 g/dL   AST 13 (L) 15 - 41 U/L   ALT 12 (L) 14 - 54 U/L   Alkaline Phosphatase 105 38 - 126 U/L   Total  Bilirubin 0.5 0.3 - 1.2 mg/dL   GFR calc non Af Amer >60 >60 mL/min   GFR calc Af Amer >60 >60 mL/min    Comment: (NOTE) The eGFR has been calculated using the CKD EPI equation. This calculation has not been validated in all clinical situations. eGFR's persistently <60 mL/min signify possible Chronic Kidney Disease.    Anion gap 6 5 - 15    Comment: Performed at [facility], [address] [place], [place] [address]  CBC with Differential     Status: Abnormal   Collection Time: 06/20/17  3:00 PM  Result Value Ref Range   WBC 13.5 (H) 3.6 - 11.0 K/uL   RBC 4.51 3.80 - 5.20 MIL/uL   Hemoglobin 11.1 (L) 12.0 - 16.0 g/dL   HCT 35.1 35.0 - 47.0 %   MCV 77.8 (L) 80.0 - 100.0 fL   MCH 24.5 (L) 26.0 - 34.0 pg   MCHC 31.5 (L) 32.0 - 36.0 g/dL   RDW 31.0 (H) 11.5 - 14.5 %   Platelets 252 150 - 440 K/uL   Neutrophils Relative % 86 %   Neutro Abs 11.6 (H) 1.4 - 6.5 K/uL   Lymphocytes Relative 6 %   Lymphs Abs 0.7 (L) 1.0 - 3.6 K/uL   Monocytes Relative 8 %   Monocytes Absolute 1.1 (H) 0.2 - 0.9 K/uL   Eosinophils Relative 1 %   Eosinophils Absolute 0.1 0 - 0.7 K/uL   Basophils Relative 1 %   Basophils Absolute 0.1 0 - 0.1 K/uL    Comment: Performed at [facility], [address] [place], [place] [address]   Ct Soft Tissue Neck W Contrast  Result Date: 06/20/2017 CLINICAL DATA:  Initial evaluation for acute pain and swelling at left neck extending towards chest. EXAM: CT NECK WITH CONTRAST TECHNIQUE: Multidetector CT imaging of the neck was performed using the standard protocol following the bolus administration of intravenous contrast. CONTRAST:  49m ISOVUE-370 IOPAMIDOL (ISOVUE-370) INJECTION 76% COMPARISON:  None. FINDINGS: Pharynx and larynx: Oral cavity within normal limits without mass lesion or loculated fluid collection. Oropharynx and nasopharynx within normal limits. No retropharyngeal collection. Remainder of the hypopharynx and supraglottic larynx within  normal limits. Epiglottis normal. Vallecula clear. True cords apposed and not well evaluated. Subglottic airway clear. Salivary glands: Salivary glands including the parotid and submandibular glands are normal. Thyroid: Thyroid normal. Lymph nodes: Few mildly prominent nodes within the left posterior neck measure up to 7  mm, likely reactive. No other adenopathy within the neck. Vascular: Normal intravascular enhancement seen throughout the neck. Atherosclerotic change present about the aortic arch and carotid bifurcations. Limited intracranial: Unremarkable. Visualized orbits: Visualized globes within normal limits. 16 mm well-circumscribed hyperdense lesion at the posterior aspect of the left orbit noted, indeterminate, but grossly similar relative to previous scans dating back to 2017. Orbital soft tissues otherwise unremarkable. Mastoids and visualized paranasal sinuses: Paranasal sinuses and mastoid air cells are clear. Middle ear cavities are well pneumatized and clear. Skeleton: No acute osseous abnormality. No worrisome lytic or blastic osseous lesions. Upper chest: Visualized upper mediastinum within normal limits. Visualized lungs are clear. Other: Asymmetric soft tissue swelling with inflammatory stranding present within the subcutaneous fat of the left posterior neck (series 2, image 31). Overlying skin thickening. Extension into the underlying left posterior paraspinous musculature without deeper extension into the neck at this time. Inferior extension down the lateral left neck into the upper left anterior chest wall (series 2, image 74). Findings concerning for infection/cellulitis. Slightly more confluent area of focal phlegmon noted without definite discrete abscess or drainable fluid collection (series 2, image 31). Few small reactive lymph nodes noted within this region. IMPRESSION: 1. Soft tissue swelling with inflammatory stranding and skin thickening in the left posterior neck, extending  inferiorly down the left neck into the upper left anterior chest wall. Findings concerning for acute infection/cellulitis. No discrete abscess or drainable fluid collection at this time. Extension into the underlying left posterior paraspinous musculature without deeper extension into the neck. 2. 16 mm well-circumscribed intraorbital lesion at the posterior left orbit, indeterminate, but grossly similar as compared to prior CTs dating back to 2017. Follow-up examination with nonemergent MRI suggested for further characterization. Electronically Signed   By: Jeannine Boga M.D.   On: 06/20/2017 19:53    Pending Labs Unresulted Labs (From admission, onward)   Start     Ordered   06/22/17 1330  Vancomycin, trough  Once-Timed,   STAT     06/20/17 2120   06/20/17 1747  Blood Culture (routine x 2)  BLOOD CULTURE X 2,   STAT     06/20/17 1748   Signed and Held  Basic metabolic panel  Tomorrow morning,   R     Signed and Held   Signed and Held  CBC  Tomorrow morning,   R     Signed and Held   Signed and Held  CBC  (enoxaparin (LOVENOX)    CrCl >/= 30 ml/min)  Once,   R    Comments:  Baseline for enoxaparin therapy IF NOT ALREADY DRAWN.  Notify MD if PLT < 100 K.    Signed and Held   Signed and Held  Creatinine, serum  (enoxaparin (LOVENOX)    CrCl >/= 30 ml/min)  Once,   R    Comments:  Baseline for enoxaparin therapy IF NOT ALREADY DRAWN.    Signed and Held   Signed and Held  Creatinine, serum  (enoxaparin (LOVENOX)    CrCl >/= 30 ml/min)  Weekly,   R    Comments:  while on enoxaparin therapy    Signed and Held      Vitals/Pain Today's Vitals   06/20/17 1800 06/20/17 1830 06/20/17 1915 06/20/17 1918  BP: 127/79 131/65 135/72   Pulse: 86 89 95   Resp:  18 16   Temp:      TempSrc:      SpO2: 99% 97% 98%   Weight:  Height:      PainSc:    10-Worst pain ever    Isolation Precautions No active isolations  Medications Medications  vancomycin (VANCOCIN) IVPB 1000 mg/200  mL premix (has no administration in time range)  insulin aspart (novoLOG) injection 0-20 Units (has no administration in time range)  insulin aspart (novoLOG) injection 0-5 Units (has no administration in time range)  sodium chloride 0.9 % bolus 1,000 mL (0 mLs Intravenous Stopped 06/20/17 1918)  piperacillin-tazobactam (ZOSYN) IVPB 3.375 g (0 g Intravenous Stopped 06/20/17 1937)  vancomycin (VANCOCIN) IVPB 1000 mg/200 mL premix (0 mg Intravenous Stopped 06/20/17 2110)  iopamidol (ISOVUE-370) 76 % injection 75 mL (75 mLs Intravenous Contrast Given 06/20/17 1854)  fentaNYL (SUBLIMAZE) injection 50 mcg (50 mcg Intravenous Given 06/20/17 1840)  ondansetron (ZOFRAN) injection 4 mg (4 mg Intravenous Given 06/20/17 1839)    Mobility walks with device

## 2017-06-20 NOTE — Consult Note (Signed)
Pharmacy Antibiotic Note  Cristina [name] is a 57 y.o. female admitted on 06/20/2017 with cellulitis.  Pharmacy has been consulted for Vancomycin dosing. Patient received vancomycin 1g IV x 1 dose in ED.   Plan: Ke: 0.079   Vd: 47.6   T1/2: 8.8  DW: 68 kg  Start Vancomycin 1g IV every 12 hours with 6 hour stack dosing. Calculated trough at Css 14. Trough level prior to 4th level.    Height: 4\' 11"  (149.9 cm) Weight: 228 lb (103.4 kg) IBW/kg (Calculated) : 43.2  Temp (24hrs), Avg:99.7 F (37.6 C), Min:99.7 F (37.6 C), Max:99.7 F (37.6 C)  Recent Labs  Lab 06/17/17 0810 06/20/17 1459 06/20/17 1500  WBC 7.2  --  13.5*  CREATININE  --   --  0.60  LATICACIDVEN  --  1.0  --     Estimated Creatinine Clearance: 80.4 mL/min (by C-G formula based on SCr of 0.6 mg/dL).    Allergies  Allergen Reactions  . Bee Venom Anaphylaxis and Shortness Of Breath    Stated by Patient  . Shellfish Allergy Anaphylaxis and Shortness Of Breath    Stated by Patient Stated by Patient   . Shellfish-Derived Products Anaphylaxis and Shortness Of Breath    Stated by Patient  . Codeine   . Thorazine [Chlorpromazine] Swelling  . Tylenol With Codeine #3 [Acetaminophen-Codeine] Other (See Comments)    Hallucinations  . Metronidazole Rash    Antimicrobials this admission: 5/5 Zosyn >> x1 5/5 Vancomycin >>     Microbiology results: 5/5 BCx: pending  Thank you for allowing pharmacy to be a part of this patient's care.  Pernell Dupre, PharmD, BCPS Clinical Pharmacist 06/20/2017 9:22 PM

## 2017-06-20 NOTE — ED Provider Notes (Signed)
[facility] Emergency Department Provider Note  ___________________________________________   First MD Initiated Contact with Patient 06/20/17 1732     (approximate)  I have reviewed the triage vital signs and the nursing notes.   HISTORY  Chief Complaint Insect Bite and Cellulitis   HPI Cristina [name] is a 57 y.o. female CHF as well as COPD who is presenting to the emergency department today with posterior left neck swelling that is now extending down onto her chest.  She says that this started as a "bug bite" this past Friday.  There was a circular erythematous area about 3 to 4 cm on the left posterior neck which is now expanded into an erythematous area covering the entire left side of the neck and now extending down to the patient's chest on the left side as well.  Patient does not report fever.  Says that she just started doxycycline this morning, 1 dose.   Past Medical History:  Diagnosis Date  . Abdominal hernia   . Arthritis   . Asthma   . Bladder dysfunction   . Carpal tunnel syndrome   . CHF (congestive heart failure) ([place])   . COPD (chronic obstructive pulmonary disease) ([place])   . DDD (degenerative disc disease), lumbar   . Diabetes mellitus without complication ([place])   . Dyspnea   . Edema   . Fatty tumor   . GERD (gastroesophageal reflux disease)   . H/O colonoscopy   . H/O tubal ligation    hx btl  . History of cholecystectomy    hx of cholecystectomy  . HLD (hyperlipidemia)   . Hypertension   . Muscle cramps   . Neuropathy   . Obesity   . Recurrent boils   . Schizoaffective disorder ([place])   . Sleep apnea   . Stroke ([place])   . UTI (lower urinary tract infection)   . Vaginitis     Patient Active Problem List   Diagnosis Date Noted  . AVM (arteriovenous malformation) of small bowel, acquired with hemorrhage   . Goals of care, counseling/discussion 05/17/2017  . Iron deficiency anemia 05/10/2017  . B12 deficiency 05/10/2017    . GI bleed 03/06/2017  . Abdominal pain   . Symptomatic anemia   . Acute posthemorrhagic anemia   . Heme + stool   . Sepsis ([place]) 07/15/2016  . Septic shock ([place]) 10/28/2015  . COPD (chronic obstructive pulmonary disease) ([place]) 07/31/2015  . Aspirin overdose 07/30/2015  . Schizoaffective disorder, bipolar type ([place]) 12/05/2014  . HTN (hypertension) 12/05/2014  . Sleep apnea 12/05/2014  . Diabetes ([place]) 12/05/2014  . Incomplete bladder emptying 12/04/2014  . Recurrent UTI 12/04/2014  . Polyneuropathy 03/20/2009  . Arthritis, degenerative 05/23/2007  . HLD (hyperlipidemia) 05/23/2007    Past Surgical History:  Procedure Laterality Date  . CARDIAC CATHETERIZATION Left 09/24/2014   Procedure: Left Heart Cath and Coronary Angiography;  Surgeon: Dionisio , MD;  Location: [facility];  Service: Cardiovascular;  Laterality: Left;  . CHOLECYSTECTOMY    . COLONOSCOPY WITH PROPOFOL N/A 08/12/2015   Procedure: COLONOSCOPY WITH PROPOFOL;  Surgeon: Lollie Sails, MD;  Location: [facility] ENDOSCOPY;  Service: Endoscopy;  Laterality: N/A;  . COLONOSCOPY WITH PROPOFOL N/A 04/23/2017   Procedure: COLONOSCOPY WITH PROPOFOL;  Surgeon: Jonathon Bellows, MD;  Location: [facility] ENDOSCOPY;  Service: Endoscopy;  Laterality: N/A;  . ENTEROSCOPY N/A 05/28/2017   Procedure: SMALL BOWEL ENTEROSCOPY;  Surgeon: Lin Landsman, MD;  Location: [facility] ENDOSCOPY;  Service: Gastroenterology;  Laterality: N/A;  . ENTEROSCOPY N/A 06/04/2017   Procedure: RETROGRADE SMALL BOWEL ENTEROSCOPY;  Surgeon: Lin Landsman, MD;  Location: [facility] ENDOSCOPY;  Service: Gastroenterology;  Laterality: N/A;  . ESOPHAGOGASTRODUODENOSCOPY (EGD) WITH PROPOFOL N/A 08/12/2015   Procedure: ESOPHAGOGASTRODUODENOSCOPY (EGD) WITH PROPOFOL;  Surgeon: Lollie Sails, MD;  Location: [facility] ENDOSCOPY;  Service: Endoscopy;  Laterality: N/A;  . ESOPHAGOGASTRODUODENOSCOPY (EGD) WITH PROPOFOL N/A 03/06/2017   Procedure:  ESOPHAGOGASTRODUODENOSCOPY (EGD) WITH PROPOFOL;  Surgeon: Lucilla Lame, MD;  Location: [facility] ENDOSCOPY;  Service: Endoscopy;  Laterality: N/A;  . GIVENS CAPSULE STUDY N/A 05/25/2017   Procedure: GIVENS CAPSULE STUDY;  Surgeon: Lucilla Lame, MD;  Location: [facility] ENDOSCOPY;  Service: Endoscopy;  Laterality: N/A;  . tubal ligation      Prior to Admission medications   Medication Sig Start Date End Date Taking? Authorizing Provider  acetaminophen (TYLENOL) 500 MG tablet Take 500 mg by mouth every 8 (eight) hours as needed for mild pain or moderate pain.    [provider]  aspirin EC 81 MG tablet Take 81 mg by mouth daily.    [provider]  atorvastatin (LIPITOR) 20 MG tablet Take 20 mg by mouth at bedtime.    [provider]  busPIRone (BUSPAR) 10 MG tablet Take 10 mg by mouth 2 (two) times daily.     [provider]  clonazePAM (KLONOPIN) 0.5 MG tablet Take 1 tablet (0.5 mg total) by mouth 2 (two) times daily. Patient taking differently: Take 0.5 mg by mouth daily.  07/18/16   Gouru, Illene Silver, MD  Cyanocobalamin (B-12) 1000 MCG CAPS Take 1,000 mcg by mouth daily. 05/10/17   Karen Kitchens, NP  diphenoxylate-atropine (LOMOTIL) 2.5-0.025 MG tablet Take 1 tablet by mouth daily as needed for diarrhea or loose stools.    [provider]  ferrous sulfate 325 (65 FE) MG EC tablet Take 1 tablet (325 mg total) by mouth 2 (two) times daily. 03/07/17 06/17/17  Hillary Bow, MD  furosemide (LASIX) 20 MG tablet Take 1 tablet (20 mg total) by mouth 2 (two) times daily. Patient taking differently: Take 20 mg by mouth daily.  07/18/16   Nicholes Mango, MD  haloperidol decanoate (HALDOL DECANOATE) 100 MG/ML injection Inject 1 mL (100 mg total) into the muscle every 30 (thirty) days. 08/02/15   Hildred Priest, MD  insulin aspart (NOVOLOG) 100 UNIT/ML injection Inject 100 Units into the skin 3 (three) times daily before meals.    [provider]  Insulin Pen Needle  (FIFTY50 PEN NEEDLES) 31G X 8 MM MISC PEN NEEDLES 31G X 6 MM MISC 10/07/12   [provider]  loperamide (IMODIUM A-D) 2 MG tablet Take 2 mg by mouth 4 (four) times daily as needed for diarrhea or loose stools.    [provider]  menthol-cetylpyridinium (CEPACOL) 3 MG lozenge Take 1 lozenge by mouth as needed for sore throat.    [provider]  metFORMIN (GLUCOPHAGE-XR) 500 MG 24 hr tablet Take 1,000 mg by mouth 2 (two) times daily.  05/23/15   [provider]  metoprolol tartrate (LOPRESSOR) 25 MG tablet Take 0.5 tablets (12.5 mg total) by mouth 2 (two) times daily. Patient taking differently: Take 50 mg by mouth 2 (two) times daily.  07/18/16   Nicholes Mango, MD  nitroGLYCERIN (NITROSTAT) 0.3 MG SL tablet Place 0.3 mg under the tongue every 5 (five) minutes as needed for chest pain.    [provider]  ondansetron (ZOFRAN) 4 MG tablet Take 4 mg by  mouth every 6 (six) hours as needed for nausea or vomiting.    [provider]  pantoprazole (PROTONIX) 40 MG tablet Take 40 mg by mouth 2 (two) times daily.    [provider]  potassium chloride 20 MEQ TBCR Take 10 mEq by mouth daily. 07/18/16   Gouru, Illene Silver, MD  pregabalin (LYRICA) 100 MG capsule Take 100 mg by mouth 3 (three) times daily.    [provider]  umeclidinium-vilanterol (ANORO ELLIPTA) 62.5-25 MCG/INH AEPB Inhale 1 puff into the lungs daily.    [provider]  venlafaxine XR (EFFEXOR-XR) 150 MG 24 hr capsule Take 225 mg by mouth daily with breakfast.    [provider]  venlafaxine XR (EFFEXOR-XR) 75 MG 24 hr capsule Take 75 mg by mouth daily with breakfast.     [provider]    Allergies Bee venom; Shellfish allergy; Shellfish-derived products; Codeine; Thorazine [chlorpromazine]; Tylenol with codeine #3 [acetaminophen-codeine]; and Metronidazole  Family History  Problem Relation Age of Onset  . CAD Mother   . Lung cancer Mother   .  Mental illness Mother   . Mental illness Father 51       suicided   . Mental illness Sister   . CAD Sister   . Mental illness Brother   . CAD Sister     Social History Social History   Tobacco Use  . Smoking status: Current Some Day Smoker    Packs/day: 0.25    Types: Cigarettes  . Smokeless tobacco: Former Systems developer    Types: Chew  Substance Use Topics  . Alcohol use: No    Alcohol/week: 0.0 oz  . Drug use: No    Review of Systems  Constitutional: No fever/chills Eyes: No visual changes. ENT: No sore throat. Cardiovascular: Denies chest pain. Respiratory: Denies shortness of breath. Gastrointestinal: No abdominal pain.  No nausea, no vomiting.  No diarrhea.  No constipation. Genitourinary: Negative for dysuria. Musculoskeletal: Negative for back pain. Skin: As above Neurological: Negative for headaches, focal weakness or numbness.   ____________________________________________   PHYSICAL EXAM:  VITAL SIGNS: ED Triage Vitals  Enc Vitals Group     BP 06/20/17 1454 (!) 125/50     Pulse Rate 06/20/17 1454 90     Resp 06/20/17 1454 18     Temp 06/20/17 1454 99.7 F (37.6 C)     Temp Source 06/20/17 1454 Oral     SpO2 06/20/17 1454 97 %     Weight 06/20/17 1455 228 lb (103.4 kg)     Height 06/20/17 1455 4\' 11"  (1.499 m)     Head Circumference --      Peak Flow --      Pain Score 06/20/17 1454 10     Pain Loc --      Pain Edu? --      Excl. in [place]? --     Constitutional: Alert and oriented. Well appearing and in no acute distress. Eyes: Conjunctivae are normal.  Head: Atraumatic. Nose: No congestion/rhinnorhea. Mouth/Throat: Mucous membranes are moist.  Neck: Erythematous, indurated region to the left posterior neck that is very tender to palpation and warm.  There is no fluctuance.  The erythema extends down onto the left side of the chest as well as covering the left side of the neck.  The indurated area is about 6 x 4 cm.  No exudate. Cardiovascular: Normal  rate, regular rhythm. Grossly normal heart sounds.  Respiratory: Normal respiratory effort.  No retractions. Lungs CTAB.  Gastrointestinal: Soft and nontender. No distention.  Musculoskeletal: No lower extremity tenderness nor edema.  No joint effusions. Neurologic:  Normal speech and language. No gross focal neurologic deficits are appreciated. Skin:  Skin is warm, dry and intact. No rash noted. Psychiatric: Mood and affect are normal. Speech and behavior are normal.  ____________________________________________   LABS (all labs ordered are listed, but only abnormal results are displayed)  Labs Reviewed  COMPREHENSIVE METABOLIC PANEL - Abnormal; Notable for the following components:      Result Value   Chloride 97 (*)    Glucose, Bld 175 (*)    AST 13 (*)    ALT 12 (*)    All other components within normal limits  CBC WITH DIFFERENTIAL/PLATELET - Abnormal; Notable for the following components:   WBC 13.5 (*)    Hemoglobin 11.1 (*)    MCV 77.8 (*)    MCH 24.5 (*)    MCHC 31.5 (*)    RDW 31.0 (*)    Neutro Abs 11.6 (*)    Lymphs Abs 0.7 (*)    Monocytes Absolute 1.1 (*)    All other components within normal limits  CULTURE, BLOOD (ROUTINE X 2)  CULTURE, BLOOD (ROUTINE X 2)  LACTIC ACID, PLASMA   ____________________________________________  EKG   ____________________________________________  RADIOLOGY  Cellulitis but no drainable abscess. ____________________________________________   PROCEDURES  Procedure(s) performed:   Procedures  Critical Care performed:   ____________________________________________   INITIAL IMPRESSION / ASSESSMENT AND PLAN / ED COURSE  Pertinent labs & imaging results that were available during my care of the patient were reviewed by me and considered in my medical decision making (see chart for details).  DDX: Cellulitis, abscess, sepsis As part of my medical decision making, I reviewed the following data within the electronic  MEDICAL RECORD NUMBER Notes from prior ED visits  ----------------------------------------- 8:12 PM on 06/20/2017 -----------------------------------------  Patient without abscess noted on the CT of the neck.  However, large area of cellulitis which appears to have accumulated rapidly.  Will be admitted to the hospital.  Patient is understanding of the plan.  Signed out to Dr. Verdell Carmine. ____________________________________________   FINAL CLINICAL IMPRESSION(S) / ED DIAGNOSES  Cellulitis of the neck and chest.    NEW MEDICATIONS STARTED DURING THIS VISIT:  New Prescriptions   No medications on file     Note:  This document was prepared using Dragon voice recognition software and may include unintentional dictation errors.     Orbie Pyo, MD 06/20/17 2015

## 2017-06-21 LAB — BASIC METABOLIC PANEL
Anion gap: 7 (ref 5–15)
BUN: 8 mg/dL (ref 6–20)
CO2: 29 mmol/L (ref 22–32)
Calcium: 8.8 mg/dL — ABNORMAL LOW (ref 8.9–10.3)
Chloride: 102 mmol/L (ref 101–111)
Creatinine, Ser: 0.47 mg/dL (ref 0.44–1.00)
GFR calc Af Amer: >60 mL/min (ref >60)
GFR calc non Af Amer: >60 mL/min (ref >60)
Glucose, Bld: 161 mg/dL — ABNORMAL HIGH (ref 65–99)
Potassium: 3.4 mmol/L — ABNORMAL LOW (ref 3.5–5.1)
Sodium: 138 mmol/L (ref 135–145)

## 2017-06-21 LAB — GLUCOSE, CAPILLARY
Glucose-Capillary: 217 mg/dL — ABNORMAL HIGH (ref 65–99)
Glucose-Capillary: 143 mg/dL — ABNORMAL HIGH (ref 65–99)
Glucose-Capillary: 200 mg/dL — ABNORMAL HIGH (ref 65–99)
Glucose-Capillary: 144 mg/dL — ABNORMAL HIGH (ref 65–99)
Glucose-Capillary: 165 mg/dL — ABNORMAL HIGH (ref 65–99)

## 2017-06-21 LAB — CBC
HCT: 33.4 % — ABNORMAL LOW (ref 35.0–47.0)
Hemoglobin: 10.5 g/dL — ABNORMAL LOW (ref 12.0–16.0)
MCH: 24.7 pg — ABNORMAL LOW (ref 26.0–34.0)
MCHC: 31.4 g/dL — ABNORMAL LOW (ref 32.0–36.0)
MCV: 78.5 fL — ABNORMAL LOW (ref 80.0–100.0)
Platelets: 198 10*3/uL (ref 150–440)
RBC: 4.26 MIL/uL (ref 3.80–5.20)
RDW: 30.4 % — ABNORMAL HIGH (ref 11.5–14.5)
WBC: 11 10*3/uL (ref 3.6–11.0)

## 2017-06-21 LAB — MRSA PCR SCREENING: MRSA by PCR: NEGATIVE

## 2017-06-21 MED ORDER — NYSTATIN 100000 UNIT/GM EX POWD
Freq: Two times a day (BID) | CUTANEOUS | Status: DC
  Administered 2017-06-21: 23:00:00 via TOPICAL
  Filled 2017-06-21 (×2): qty 15

## 2017-06-21 MED ORDER — MORPHINE SULFATE (PF) 2 MG/ML IV SOLN
2.0000 mg | INTRAVENOUS | Status: DC | PRN
Start: 2017-06-21 — End: 2017-06-22

## 2017-06-21 MED ORDER — OXYCODONE HCL 5 MG PO TABS
5.0000 mg | ORAL_TABLET | Freq: Four times a day (QID) | ORAL | Status: DC | PRN
  Administered 2017-06-21 (×2): 5 mg via ORAL
  Filled 2017-06-21 (×2): qty 1

## 2017-06-21 MED ORDER — OXYCODONE HCL ER 10 MG PO T12A
10.0000 mg | EXTENDED_RELEASE_TABLET | Freq: Two times a day (BID) | ORAL | Status: DC
  Administered 2017-06-21 – 2017-06-22 (×3): 10 mg via ORAL
  Filled 2017-06-21 (×3): qty 1

## 2017-06-21 MED ORDER — OXYCODONE-ACETAMINOPHEN 5-325 MG PO TABS
1.0000 | ORAL_TABLET | ORAL | Status: DC | PRN
  Administered 2017-06-21 – 2017-06-22 (×2): 1 via ORAL
  Filled 2017-06-21 (×2): qty 1

## 2017-06-21 NOTE — Progress Notes (Signed)
Advanced care plan.  Purpose of the Encounter: CODE STATUS  Parties in Attendance:Patient  Patient's Decision Capacity:Good Subjective/Patient's story: Presented with swelling and redness of neck Objective/Medical story Has neck cellutlitis Goals of care determination:  Advance directives discussed with patient Patient wants everything done for now, that is cardiac resuscitation, intubation and ventilator if need arises CODE STATUS: Full code   Time spent discussing advanced care planning: 16 minutes

## 2017-06-21 NOTE — Progress Notes (Signed)
[facility] [place] NAME: Cristina [name]    MR#:  [id number]  DATE OF BIRTH:  Jul 28, 1960  SUBJECTIVE:  Seen and evaluated by me today Has redness and swelling over the left cheek area and left side of the neck extending onto the chest wall Has aching pain over the left side of the neck No fever and chills No difficulty swallowing food No trouble breathing   REVIEW OF SYSTEMS:    ROS  CONSTITUTIONAL: No documented fever. No fatigue, weakness. No weight gain, no weight loss.  EYES: No blurry or double vision.  ENT: No tinnitus. No postnasal drip. No redness of the oropharynx.  Redness, swelling left side of neck RESPIRATORY: No cough, no wheeze, no hemoptysis. No dyspnea.  CARDIOVASCULAR: No chest pain. No orthopnea. No palpitations. No syncope.  GASTROINTESTINAL: No nausea, no vomiting or diarrhea. No abdominal pain. No melena or hematochezia.  GENITOURINARY: No dysuria or hematuria.  ENDOCRINE: No polyuria or nocturia. No heat or cold intolerance.  HEMATOLOGY: No anemia. No bruising. No bleeding.  INTEGUMENTARY: redness of skin left side of neck MUSCULOSKELETAL: No arthritis. No swelling. No gout.  NEUROLOGIC: No numbness, tingling, or ataxia. No seizure-type activity.  PSYCHIATRIC: No anxiety. No insomnia. No ADD.   DRUG ALLERGIES:   Allergies  Allergen Reactions  . Bee Venom Anaphylaxis and Shortness Of Breath    Stated by Patient  . Shellfish Allergy Anaphylaxis and Shortness Of Breath    Stated by Patient Stated by Patient   . Shellfish-Derived Products Anaphylaxis and Shortness Of Breath    Stated by Patient  . Codeine   . Thorazine [Chlorpromazine] Swelling  . Tylenol With Codeine #3 [Acetaminophen-Codeine] Other (See Comments)    Hallucinations  . Metronidazole Rash    VITALS:  Blood pressure 117/60, pulse 81, temperature 98 F (36.7 C), temperature source Oral, resp. rate 17, height 5\' 4"  (1.626 m), weight 102.5 kg  (225 lb 15.5 oz), SpO2 98 %.  PHYSICAL EXAMINATION:   Physical Exam  GENERAL:  57 y.o.-year-old patient lying in the bed with no acute distress.  EYES: Pupils equal, round, reactive to light and accommodation. No scleral icterus. Extraocular muscles intact.  HEENT: Head atraumatic, normocephalic. Oropharynx and nasopharynx clear.  Swelling, induration of skin left side of neck extending to the ear upwards and to chest wall down NECK:  Supple, no jugular venous distention. No thyroid enlargement, no tenderness.  LUNGS: Normal breath sounds bilaterally, no wheezing, rales, rhonchi. No use of accessory muscles of respiration.  CARDIOVASCULAR: S1, S2 normal. No murmurs, rubs, or gallops.  ABDOMEN: Soft, nontender, nondistended. Bowel sounds present. No organomegaly or mass.  EXTREMITIES: No cyanosis, clubbing or edema b/l.    NEUROLOGIC: Cranial nerves II through XII are intact. No focal Motor or sensory deficits b/l.   PSYCHIATRIC: The patient is alert and oriented x 3.  SKIN: Intertrigo underneath breasts Redness of skin left side of neck with induration  LABORATORY PANEL:   CBC Recent Labs  Lab 06/21/17 0419  WBC 11.0  HGB 10.5*  HCT 33.4*  PLT 198   ------------------------------------------------------------------------------------------------------------------ Chemistries  Recent Labs  Lab 06/20/17 1500 06/21/17 0419  NA 135 138  K 3.7 3.4*  CL 97* 102  CO2 32 29  GLUCOSE 175* 161*  BUN 8 8  CREATININE 0.60 0.47  CALCIUM 9.0 8.8*  AST 13*  --   ALT 12*  --   ALKPHOS 105  --   BILITOT 0.5  --    ------------------------------------------------------------------------------------------------------------------  Cardiac Enzymes No results for input(s): TROPONINI in the last 168 hours. ------------------------------------------------------------------------------------------------------------------  RADIOLOGY:  Ct Soft Tissue Neck W Contrast  Result Date:  06/20/2017 CLINICAL DATA:  Initial evaluation for acute pain and swelling at left neck extending towards chest. EXAM: CT NECK WITH CONTRAST TECHNIQUE: Multidetector CT imaging of the neck was performed using the standard protocol following the bolus administration of intravenous contrast. CONTRAST:  75mL ISOVUE-370 IOPAMIDOL (ISOVUE-370) INJECTION 76% COMPARISON:  None. FINDINGS: Pharynx and larynx: Oral cavity within normal limits without mass lesion or loculated fluid collection. Oropharynx and nasopharynx within normal limits. No retropharyngeal collection. Remainder of the hypopharynx and supraglottic larynx within normal limits. Epiglottis normal. Vallecula clear. True cords apposed and not well evaluated. Subglottic airway clear. Salivary glands: Salivary glands including the parotid and submandibular glands are normal. Thyroid: Thyroid normal. Lymph nodes: Few mildly prominent nodes within the left posterior neck measure up to 7 mm, likely reactive. No other adenopathy within the neck. Vascular: Normal intravascular enhancement seen throughout the neck. Atherosclerotic change present about the aortic arch and carotid bifurcations. Limited intracranial: Unremarkable. Visualized orbits: Visualized globes within normal limits. 16 mm well-circumscribed hyperdense lesion at the posterior aspect of the left orbit noted, indeterminate, but grossly similar relative to previous scans dating back to 2017. Orbital soft tissues otherwise unremarkable. Mastoids and visualized paranasal sinuses: Paranasal sinuses and mastoid air cells are clear. Middle ear cavities are well pneumatized and clear. Skeleton: No acute osseous abnormality. No worrisome lytic or blastic osseous lesions. Upper chest: Visualized upper mediastinum within normal limits. Visualized lungs are clear. Other: Asymmetric soft tissue swelling with inflammatory stranding present within the subcutaneous fat of the left posterior neck (series 2, image 31).  Overlying skin thickening. Extension into the underlying left posterior paraspinous musculature without deeper extension into the neck at this time. Inferior extension down the lateral left neck into the upper left anterior chest wall (series 2, image 74). Findings concerning for infection/cellulitis. Slightly more confluent area of focal phlegmon noted without definite discrete abscess or drainable fluid collection (series 2, image 31). Few small reactive lymph nodes noted within this region. IMPRESSION: 1. Soft tissue swelling with inflammatory stranding and skin thickening in the left posterior neck, extending inferiorly down the left neck into the upper left anterior chest wall. Findings concerning for acute infection/cellulitis. No discrete abscess or drainable fluid collection at this time. Extension into the underlying left posterior paraspinous musculature without deeper extension into the neck. 2. 16 mm well-circumscribed intraorbital lesion at the posterior left orbit, indeterminate, but grossly similar as compared to prior CTs dating back to 2017. Follow-up examination with nonemergent MRI suggested for further characterization. Electronically Signed   By: Jeannine Boga M.D.   On: 06/20/2017 19:53     ASSESSMENT AND PLAN:   57 year old obese female patient with history of congestive heart failure, COPD, diabetes mellitus type 2, bladder dysfunction, arthritis, asthma, hyperlipidemia, GERD, history of GI bleed and small bowel arteriovenous malformations currently under hospitalist service for neck cellulitis.  -Left neck cellulitis No evidence of any abscess on CT scan of the neck Continue IV vancomycin antibiotic No airway compromise No trouble swallowing food  -Intertrigo Nystatin powder twice daily  -Left neck pain secondary to cellulitis Start oral oxycodone along with PRN IV morphine for better control of pain  -Diabetic neuropathy Continue Lyrica  -Leukocytosis Monitor  WBC count  -Type 2 diabetes mellitus Continue sliding scale coverage with insulin Resume metformin  -Iron deficiency anemia Monitor hemoglobin hematocrit Continue  iron supplements  All the records are reviewed and case discussed with Care Management/Social Worker. Management plans discussed with the patient, family and they are in agreement.  CODE STATUS: Full code  DVT Prophylaxis: SCDs  TOTAL TIME TAKING CARE OF THIS PATIENT: 36 minutes.   POSSIBLE D/C IN 2 to 3 DAYS, DEPENDING ON CLINICAL CONDITION.  Saundra Shelling M.D on 06/21/2017 at 10:47 AM  Between 7am to 6pm - Pager - [phone]  After 6pm go to [email or web address] - password [email or web address] [facility]  Office  [phone]  CC: Primary care physician; System, Pcp Not In  Note: This dictation was prepared with Dragon dictation along with smaller phrase technology. Any transcriptional errors that result from this process are unintentional.

## 2017-06-21 NOTE — Progress Notes (Signed)
Inpatient Diabetes Program Recommendations  AACE/ADA: New Consensus Statement on Inpatient Glycemic Control (2015)  Target Ranges:  Prepandial:   less than 140 mg/dL      Peak postprandial:   less than 180 mg/dL (1-2 hours)      Critically ill patients:  140 - 180 mg/dL   Results for Cristina [name], Cristina [name] (MRN [id number]) as of 06/21/2017 12:23  Ref. Range 06/20/2017 22:16 06/21/2017 07:33 06/21/2017 11:30  Glucose-Capillary Latest Ref Range: 65 - 99 mg/dL 217 (H) 143 (H) 200 (H)     Home DM Meds: Metformin XR 1000 mg BID       Jardiance 10 mg daily       Novolog SSI   Current Orders: Novolog Resistant Correction Scale/ SSI (0-20 units) TID AC + HS     Met with Endocrinolgist (Dr. Adella Hare) on 03/19/2017.  At that visit, patient was instructed to take the following: "Continue metformin and Jardiance.  Change NovoLog sliding scale to start at 200 instead of 250 as follows 200-250: 4u, 251-300: 6 units, 301-350: 8 units, 351-400: 10 units, greater than 400: 12 units"      MD- Please consider the following in-hospital insulin adjustments:  Start Novolog Meal Coverage: Novolog 4 units TID with meals (hold if pt eats <50% of meal)      --Will follow patient during hospitalization--  Wyn Quaker RN, MSN, CDE Diabetes Coordinator Inpatient Glycemic Control Team Team Pager: [phone] (8a-5p)

## 2017-06-22 LAB — GLUCOSE, CAPILLARY: Glucose-Capillary: 217 mg/dL — ABNORMAL HIGH (ref 65–99)

## 2017-06-22 LAB — CBC
HCT: 35.8 % (ref 35.0–47.0)
Hemoglobin: 11.4 g/dL — ABNORMAL LOW (ref 12.0–16.0)
MCH: 25 pg — ABNORMAL LOW (ref 26.0–34.0)
MCHC: 31.8 g/dL — ABNORMAL LOW (ref 32.0–36.0)
MCV: 78.7 fL — ABNORMAL LOW (ref 80.0–100.0)
Platelets: 258 10*3/uL (ref 150–440)
RBC: 4.55 MIL/uL (ref 3.80–5.20)
RDW: 30.1 % — ABNORMAL HIGH (ref 11.5–14.5)
WBC: 10.6 10*3/uL (ref 3.6–11.0)

## 2017-06-22 LAB — BASIC METABOLIC PANEL
Anion gap: 8 (ref 5–15)
BUN: 10 mg/dL (ref 6–20)
CO2: 30 mmol/L (ref 22–32)
Calcium: 9.2 mg/dL (ref 8.9–10.3)
Chloride: 97 mmol/L — ABNORMAL LOW (ref 101–111)
Creatinine, Ser: 0.71 mg/dL (ref 0.44–1.00)
GFR calc Af Amer: >60 mL/min (ref >60)
GFR calc non Af Amer: >60 mL/min (ref >60)
Glucose, Bld: 248 mg/dL — ABNORMAL HIGH (ref 65–99)
Potassium: 4.2 mmol/L (ref 3.5–5.1)
Sodium: 135 mmol/L (ref 135–145)

## 2017-06-22 MED ORDER — CLINDAMYCIN HCL 300 MG PO CAPS: 300.0000 mg | ORAL_CAPSULE | Freq: Three times a day (TID) | ORAL | 0 refills | Status: AC

## 2017-06-22 MED ORDER — OXYCODONE-ACETAMINOPHEN 5-325 MG PO TABS: 1.0000 | ORAL_TABLET | Freq: Four times a day (QID) | ORAL | 0 refills | Status: AC | PRN

## 2017-06-22 NOTE — Discharge Summary (Signed)
[facility] NAME: Cristina [name]    MR#:  [id number]  DATE OF BIRTH:  08/20/1960  DATE OF ADMISSION:  06/20/2017 ADMITTING PHYSICIAN: Henreitta Leber, MD  DATE OF DISCHARGE: 06/22/2017 10:00 AM  PRIMARY CARE PHYSICIAN: System, Pcp Not In   ADMISSION DIAGNOSIS:  Cellulitis of neck [L03.221]  Arthritis Chronic congestive heart failure Emphysema Type 2 diabetes mellitus  DISCHARGE DIAGNOSIS:  Active Problems:   Cellulitis of neck Chronic congestive heart failure Emphysema Type 2 diabetes mellitus  SECONDARY DIAGNOSIS:   Past Medical History:  Diagnosis Date  . Abdominal hernia   . Arthritis   . Asthma   . Bladder dysfunction   . Carpal tunnel syndrome   . CHF (congestive heart failure) ([place])   . COPD (chronic obstructive pulmonary disease) ([place])   . DDD (degenerative disc disease), lumbar   . Diabetes mellitus without complication ([place])   . Dyspnea   . Edema   . Fatty tumor   . GERD (gastroesophageal reflux disease)   . H/O colonoscopy   . H/O tubal ligation    hx btl  . History of cholecystectomy    hx of cholecystectomy  . HLD (hyperlipidemia)   . Hypertension   . Muscle cramps   . Neuropathy   . Obesity   . Recurrent boils   . Schizoaffective disorder ([place])   . Sleep apnea   . Stroke ([place])   . UTI (lower urinary tract infection)   . Vaginitis      ADMITTING HISTORY Cristina [name]  is a 57 y.o. female with a known history of schizoaffective disorder, hypertension, obesity, hyperlipidemia, diabetes, COPD, iron deficiency anemia, GI bleed secondary to AVMs  Who presents to the hospital due to left-sided neck swelling redness and pain.  Patient says she noticed something like a bug bite/swelling in the back of her neck a couple days ago and since then she has developed progressive swelling and redness to the left side of her neck radiating down to the anterior part of her chest.  She resides at an assisted living who  sent her to the ER for further evaluation.  Patient was noted to have a cellulitis of her left side of her neck radiating to her upper chest and therefore hospitalist services were contacted for treatment evaluation.  Patient admits to some chills but no documented fever, she denies any nausea vomiting, abdominal pain.  She admits to some intermittent chest pain which is chronic for her for which she takes nitroglycerin.  She denies any other associated symptoms.  Hospitalist services were contacted for treatment evaluation.   HOSPITAL COURSE:  Patient was admitted to the medical floor.  She was worked up with CT scan of the neck which showed cellulitis of the neck.  No abscess was noted. patient received IV vancomycin antibiotic.  Her redness and swelling and induration in the neck extending into the chest wall has improved after her stay in the hospital receiving IV antibiotics pain in the neck is also resolved.. Leukocytosis also improved with IV antibiotics.  Blood sugars were controlled with sliding scale coverage with insulin.  At the time of discharge potassium was normal electrolytes were within normal limits.  WBC was within normal limits.  Patient will be discharged to [facility] assisted facility on oral antibiotics that is clindamycin.   CONSULTS OBTAINED:    DRUG ALLERGIES:   Allergies  Allergen Reactions  . Bee Venom Anaphylaxis and Shortness Of Breath  Stated by Patient  . Shellfish Allergy Anaphylaxis and Shortness Of Breath    Stated by Patient Stated by Patient   . Shellfish-Derived Products Anaphylaxis and Shortness Of Breath    Stated by Patient  . Codeine   . Thorazine [Chlorpromazine] Swelling  . Tylenol With Codeine #3 [Acetaminophen-Codeine] Other (See Comments)    Hallucinations  . Metronidazole Rash    DISCHARGE MEDICATIONS:   Allergies as of 06/22/2017      Reactions   Bee Venom Anaphylaxis, Shortness Of Breath   Stated by Patient   Shellfish Allergy  Anaphylaxis, Shortness Of Breath   Stated by Patient Stated by Patient   Shellfish-derived Products Anaphylaxis, Shortness Of Breath   Stated by Patient   Codeine    Thorazine [chlorpromazine] Swelling   Tylenol With Codeine #3 [acetaminophen-codeine] Other (See Comments)   Hallucinations   Metronidazole Rash      Medication List    STOP taking these medications   doxycycline 100 MG tablet Commonly known as:  VIBRA-TABS     TAKE these medications   acetaminophen 500 MG tablet Commonly known as:  TYLENOL Take 500 mg by mouth every 8 (eight) hours as needed for mild pain or moderate pain.   albuterol 108 (90 Base) MCG/ACT inhaler Commonly known as:  PROVENTIL HFA;VENTOLIN HFA Inhale 2 puffs into the lungs every 6 (six) hours as needed for shortness of breath.   aspirin EC 81 MG tablet Take 81 mg by mouth daily.   atorvastatin 20 MG tablet Commonly known as:  LIPITOR Take 20 mg by mouth at bedtime.   B-12 1000 MCG Caps Take 1,000 mcg by mouth daily.   busPIRone 10 MG tablet Commonly known as:  BUSPAR Take 10 mg by mouth 2 (two) times daily.   clindamycin 300 MG capsule Commonly known as:  CLEOCIN Take 1 capsule (300 mg total) by mouth 3 (three) times daily for 10 days.   clonazePAM 1 MG tablet Commonly known as:  KLONOPIN Take 1 mg by mouth at bedtime. What changed:  Another medication with the same name was changed. Make sure you understand how and when to take each.   clonazePAM 0.5 MG tablet Commonly known as:  KLONOPIN Take 1 tablet (0.5 mg total) by mouth 2 (two) times daily. What changed:  when to take this   diphenoxylate-atropine 2.5-0.025 MG tablet Commonly known as:  LOMOTIL Take 1 tablet by mouth daily as needed for diarrhea or loose stools.   docusate sodium 100 MG capsule Commonly known as:  COLACE Take 100 mg by mouth 2 (two) times daily as needed for mild constipation.   ferrous sulfate 325 (65 FE) MG tablet Take 325 mg by mouth 2 (two)  times daily with a meal.   FIFTY50 PEN NEEDLES 31G X 8 MM Misc Generic drug:  Insulin Pen Needle PEN NEEDLES 31G X 6 MM MISC   Fluticasone-Salmeterol 250-50 MCG/DOSE Aepb Commonly known as:  ADVAIR Inhale 1 puff into the lungs 2 (two) times daily.   furosemide 20 MG tablet Commonly known as:  LASIX Take 1 tablet (20 mg total) by mouth 2 (two) times daily. What changed:  when to take this   haloperidol decanoate 100 MG/ML injection Commonly known as:  HALDOL DECANOATE Inject 1 mL (100 mg total) into the muscle every 30 (thirty) days.   HONEY LEMON COUGH DROPS MT Use as directed 1 lozenge in the mouth or throat 4 (four) times daily as needed (cough).   insulin aspart 100  UNIT/ML injection Commonly known as:  novoLOG Inject 0-10 Units into the skin 3 (three) times daily before meals.   loperamide 2 MG tablet Commonly known as:  IMODIUM A-D Take 2 mg by mouth 4 (four) times daily as needed for diarrhea or loose stools.   metFORMIN 500 MG 24 hr tablet Commonly known as:  GLUCOPHAGE-XR Take 1,000 mg by mouth 2 (two) times daily.   metoprolol tartrate 25 MG tablet Commonly known as:  LOPRESSOR Take 0.5 tablets (12.5 mg total) by mouth 2 (two) times daily. What changed:  how much to take   nitroGLYCERIN 0.3 MG SL tablet Commonly known as:  NITROSTAT Place 0.3 mg under the tongue every 5 (five) minutes as needed for chest pain.   ondansetron 4 MG tablet Commonly known as:  ZOFRAN Take 4 mg by mouth every 6 (six) hours as needed for nausea or vomiting.   oxyCODONE-acetaminophen 5-325 MG tablet Commonly known as:  PERCOCET/ROXICET Take 1 tablet by mouth every 6 (six) hours as needed for up to 3 days for moderate pain.   pantoprazole 40 MG tablet Commonly known as:  PROTONIX Take 40 mg by mouth 2 (two) times daily.   potassium chloride 10 MEQ tablet Commonly known as:  K-DUR Take 10 mEq by mouth daily.   pregabalin 100 MG capsule Commonly known as:  LYRICA Take 100 mg  by mouth 3 (three) times daily.   venlafaxine XR 75 MG 24 hr capsule Commonly known as:  EFFEXOR-XR Take 75 mg by mouth daily with breakfast. Take with 150mg  capsule for a total of 225mg    venlafaxine XR 150 MG 24 hr capsule Commonly known as:  EFFEXOR-XR Take 150 mg by mouth daily with breakfast. Take with 75mg  capsule for a total of 225mg        Today  Patient seen and evaluated today No neck pain Decreased redness and swelling in the neck as well as redness on the chest wall Tolerating diet well  VITAL SIGNS:  Blood pressure 122/82, pulse 78, temperature 98.6 F (37 C), temperature source Oral, resp. rate 18, height 5\' 4"  (1.626 m), weight 102.5 kg (225 lb 15.5 oz), SpO2 98 %.  I/O:    Intake/Output Summary (Last 24 hours) at 06/22/2017 1211 Last data filed at 06/21/2017 1300 Gross per 24 hour  Intake 240 ml  Output -  Net 240 ml    PHYSICAL EXAMINATION:  Physical Exam  GENERAL:  57 y.o.-year-old patient lying in the bed with no acute distress.  Decreased swelling, induration and redness over the neck as well as the cheek area and the chest wall LUNGS: Normal breath sounds bilaterally, no wheezing, rales,rhonchi or crepitation. No use of accessory muscles of respiration.  CARDIOVASCULAR: S1, S2 normal. No murmurs, rubs, or gallops.  ABDOMEN: Soft, non-tender, non-distended. Bowel sounds present. No organomegaly or mass.  NEUROLOGIC: Moves all 4 extremities. PSYCHIATRIC: The patient is alert and oriented x 3.  SKIN: No obvious rash, lesion, or ulcer.   DATA REVIEW:   CBC Recent Labs  Lab 06/22/17 0624  WBC 10.6  HGB 11.4*  HCT 35.8  PLT 258    Chemistries  Recent Labs  Lab 06/20/17 1500  06/22/17 0624  NA 135   < > 135  K 3.7   < > 4.2  CL 97*   < > 97*  CO2 32   < > 30  GLUCOSE 175*   < > 248*  BUN 8   < > 10  CREATININE 0.60   < >  0.71  CALCIUM 9.0   < > 9.2  AST 13*  --   --   ALT 12*  --   --   ALKPHOS 105  --   --   BILITOT 0.5  --   --     < > = values in this interval not displayed.    Cardiac Enzymes No results for input(s): TROPONINI in the last 168 hours.  Microbiology Results  Results for orders placed or performed during the hospital encounter of 06/20/17  Blood Culture (routine x 2)     Status: None (Preliminary result)   Collection Time: 06/20/17  6:04 PM  Result Value Ref Range Status   Specimen Description BLOOD Blood Culture adequate volume  Final   Special Requests   Final    BOTTLES DRAWN AEROBIC AND ANAEROBIC LEFT ANTECUBITAL   Culture   Final    NO GROWTH 2 DAYS Performed at [facility], [address], [place], [place] [address]    Report Status PENDING  Incomplete  Blood Culture (routine x 2)     Status: None (Preliminary result)   Collection Time: 06/20/17  6:04 PM  Result Value Ref Range Status   Specimen Description BLOOD Blood Culture adequate volume  Final   Special Requests   Final    BOTTLES DRAWN AEROBIC AND ANAEROBIC RIGHT ANTECUBITAL   Culture   Final    NO GROWTH 2 DAYS Performed at [facility], [address] [place], [place] [address]    Report Status PENDING  Incomplete  MRSA PCR Screening     Status: None   Collection Time: 06/21/17  5:15 AM  Result Value Ref Range Status   MRSA by PCR NEGATIVE NEGATIVE Final    Comment:        The GeneXpert MRSA Assay (FDA approved for NASAL specimens only), is one component of a comprehensive MRSA colonization surveillance program. It is not intended to diagnose MRSA infection nor to guide or monitor treatment for MRSA infections. Performed at [facility], [address] [place],  [address]     RADIOLOGY:  Ct Soft Tissue Neck W Contrast  Result Date: 06/20/2017 CLINICAL DATA:  Initial evaluation for acute pain and swelling at left neck extending towards chest. EXAM: CT NECK WITH CONTRAST TECHNIQUE: Multidetector CT imaging of the neck was performed using the standard protocol following the  bolus administration of intravenous contrast. CONTRAST:  20mL ISOVUE-370 IOPAMIDOL (ISOVUE-370) INJECTION 76% COMPARISON:  None. FINDINGS: Pharynx and larynx: Oral cavity within normal limits without mass lesion or loculated fluid collection. Oropharynx and nasopharynx within normal limits. No retropharyngeal collection. Remainder of the hypopharynx and supraglottic larynx within normal limits. Epiglottis normal. Vallecula clear. True cords apposed and not well evaluated. Subglottic airway clear. Salivary glands: Salivary glands including the parotid and submandibular glands are normal. Thyroid: Thyroid normal. Lymph nodes: Few mildly prominent nodes within the left posterior neck measure up to 7 mm, likely reactive. No other adenopathy within the neck. Vascular: Normal intravascular enhancement seen throughout the neck. Atherosclerotic change present about the aortic arch and carotid bifurcations. Limited intracranial: Unremarkable. Visualized orbits: Visualized globes within normal limits. 16 mm well-circumscribed hyperdense lesion at the posterior aspect of the left orbit noted, indeterminate, but grossly similar relative to previous scans dating back to 2017. Orbital soft tissues otherwise unremarkable. Mastoids and visualized paranasal sinuses: Paranasal sinuses and mastoid air cells are clear. Middle ear cavities are well pneumatized and clear. Skeleton: No acute osseous abnormality.  No worrisome lytic or blastic osseous lesions. Upper chest: Visualized upper mediastinum within normal limits. Visualized lungs are clear. Other: Asymmetric soft tissue swelling with inflammatory stranding present within the subcutaneous fat of the left posterior neck (series 2, image 31). Overlying skin thickening. Extension into the underlying left posterior paraspinous musculature without deeper extension into the neck at this time. Inferior extension down the lateral left neck into the upper left anterior chest wall (series 2,  image 74). Findings concerning for infection/cellulitis. Slightly more confluent area of focal phlegmon noted without definite discrete abscess or drainable fluid collection (series 2, image 31). Few small reactive lymph nodes noted within this region. IMPRESSION: 1. Soft tissue swelling with inflammatory stranding and skin thickening in the left posterior neck, extending inferiorly down the left neck into the upper left anterior chest wall. Findings concerning for acute infection/cellulitis. No discrete abscess or drainable fluid collection at this time. Extension into the underlying left posterior paraspinous musculature without deeper extension into the neck. 2. 16 mm well-circumscribed intraorbital lesion at the posterior left orbit, indeterminate, but grossly similar as compared to prior CTs dating back to 2017. Follow-up examination with nonemergent MRI suggested for further characterization. Electronically Signed   By: Jeannine Boga M.D.   On: 06/20/2017 19:53    Follow up with PCP in 1 week.  Management plans discussed with the patient, family and they are in agreement.  CODE STATUS:     Code Status Orders  (From admission, onward)        Start     Ordered   06/20/17 2219  Full code  Continuous     06/20/17 2218    Code Status History    Date Active Date Inactive Code Status Order ID Comments User Context   03/06/2017 0145 03/07/2017 1830 Full Code [id number]  Saundra Shelling, MD Inpatient   07/15/2016 0748 07/18/2016 1653 Full Code [id number]  Bettey Costa, MD Inpatient   10/28/2015 2340 10/31/2015 1852 Full Code [id number]  Holley Raring, NP ED   07/30/2015 2333 08/03/2015 1541 Full Code [id number]  Clapacs, Madie Reno, MD Inpatient   12/05/2014 0225 12/07/2014 2038 Full Code [id number]  Hildred Priest, MD Inpatient   09/24/2014 0918 09/24/2014 1737 Full Code [id number]  Dionisio David, MD Inpatient   09/23/2014 1958 09/24/2014 0918 Full Code [id number]  Dustin Flock, MD Inpatient       TOTAL TIME TAKING CARE OF THIS PATIENT ON DAY OF DISCHARGE: more than 35 minutes.   Saundra Shelling M.D on 06/22/2017 at 12:11 PM  Between 7am to 6pm - Pager - [phone]  After 6pm go to [email or web address] - password [email or web address] [facility]  Office  [phone]  CC: Primary care physician; System, Pcp Not In  Note: This dictation was prepared with Dragon dictation along with smaller phrase technology. Any transcriptional errors that result from this process are unintentional.

## 2017-06-22 NOTE — Discharge Planning (Signed)
IV removed.  RN assessment and VS revealed stability for DC back to [place].  Discharge papers given. Informed of suggested FU appt. Scripts printed and signed.  Once ready, will be wheeled to front and facilty transporting home via car.

## 2017-06-22 NOTE — NC FL2 (Addendum)
[place] LEVEL OF CARE SCREENING TOOL     IDENTIFICATION  Patient Name: Cristina [name] Birthdate: 02/19/1960 Sex: female Admission Date (Current Location): 06/20/2017  [place] and [place] Number:  Selena Lesser [id number] [place] and Address:  [facility], [address] [place], [place] [address]      Provider Number: [id number]  Attending Physician Name and Address:  No att. providers found  Relative Name and Phone Number:  Sharen Hones Daughter [phone] or [place] Sister [phone]     Current Level of Care: Hospital Recommended Level of Care: [place] Prior Approval Number:    Date Approved/Denied:   PASRR Number:    Discharge Plan: Domiciliary (Rest home)([facility])    Current Diagnoses: Patient Active Problem List   Diagnosis Date Noted  . Cellulitis of neck 06/20/2017  . AVM (arteriovenous malformation) of small bowel, acquired with hemorrhage   . Goals of care, counseling/discussion 05/17/2017  . Iron deficiency anemia 05/10/2017  . B12 deficiency 05/10/2017  . GI bleed 03/06/2017  . Abdominal pain   . Symptomatic anemia   . Acute posthemorrhagic anemia   . Heme + stool   . Sepsis ([place]) 07/15/2016  . Septic shock ([place]) 10/28/2015  . COPD (chronic obstructive pulmonary disease) ([place]) 07/31/2015  . Aspirin overdose 07/30/2015  . Schizoaffective disorder, bipolar type ([place]) 12/05/2014  . HTN (hypertension) 12/05/2014  . Sleep apnea 12/05/2014  . Diabetes ([place]) 12/05/2014  . Incomplete bladder emptying 12/04/2014  . Recurrent UTI 12/04/2014  . Polyneuropathy 03/20/2009  . Arthritis, degenerative 05/23/2007  . HLD (hyperlipidemia) 05/23/2007    Orientation RESPIRATION BLADDER Height & Weight     Self, Time, Situation, Place  Normal Continent Weight: 225 lb 15.5 oz (102.5 kg) Height:  5\' 4"  (162.6 cm)  BEHAVIORAL SYMPTOMS/MOOD NEUROLOGICAL BOWEL NUTRITION STATUS      Continent Diet(Carb  modified)  AMBULATORY STATUS COMMUNICATION OF NEEDS Skin   Supervision Verbally Normal                       Personal Care Assistance Level of Assistance  Feeding, Dressing, Bathing Bathing Assistance: Limited assistance Feeding assistance: Independent Dressing Assistance: Limited assistance     Functional Limitations Info  Sight, Hearing, Speech Sight Info: Adequate Hearing Info: Adequate Speech Info: Adequate    SPECIAL CARE FACTORS FREQUENCY                       Contractures Contractures Info: Not present    Additional Factors Info  Allergies, Code Status Code Status Info: Full Code Allergies Info: BEE VENOM, SHELLFISH ALLERGY, SHELLFISH-DERIVED PRODUCTS, CODEINE, THORAZINE CHLORPROMAZINE, TYLENOL WITH CODEINE #3 ACETAMINOPHEN-CODEINE, METRONIDAZOLE            Current Medications (06/22/2017):  This is the current hospital active medication list No current facility-administered medications for this encounter.    Current Outpatient Medications  Medication Sig Dispense Refill  . acetaminophen (TYLENOL) 500 MG tablet Take 500 mg by mouth every 8 (eight) hours as needed for mild pain or moderate pain.    Marland Kitchen albuterol (PROVENTIL HFA;VENTOLIN HFA) 108 (90 Base) MCG/ACT inhaler Inhale 2 puffs into the lungs every 6 (six) hours as needed for shortness of breath.    Marland Kitchen aspirin EC 81 MG tablet Take 81 mg by mouth daily.    Marland Kitchen atorvastatin (LIPITOR) 20 MG tablet Take 20 mg by mouth at bedtime.    . busPIRone (BUSPAR) 10 MG tablet Take 10 mg by mouth  2 (two) times daily.     . clonazePAM (KLONOPIN) 0.5 MG tablet Take 1 tablet (0.5 mg total) by mouth 2 (two) times daily. (Patient taking differently: Take 0.5 mg by mouth daily. ) 15 tablet 0  . clonazePAM (KLONOPIN) 1 MG tablet Take 1 mg by mouth at bedtime.    . Cyanocobalamin (B-12) 1000 MCG CAPS Take 1,000 mcg by mouth daily. 90 capsule 0  . diphenoxylate-atropine (LOMOTIL) 2.5-0.025 MG tablet Take 1 tablet by mouth  daily as needed for diarrhea or loose stools.    . docusate sodium (COLACE) 100 MG capsule Take 100 mg by mouth 2 (two) times daily as needed for mild constipation.    . ferrous sulfate 325 (65 FE) MG tablet Take 325 mg by mouth 2 (two) times daily with a meal.    . Fluticasone-Salmeterol (ADVAIR) 250-50 MCG/DOSE AEPB Inhale 1 puff into the lungs 2 (two) times daily.    . furosemide (LASIX) 20 MG tablet Take 1 tablet (20 mg total) by mouth 2 (two) times daily. (Patient taking differently: Take 20 mg by mouth daily. ) 60 tablet 0  . haloperidol decanoate (HALDOL DECANOATE) 100 MG/ML injection Inject 1 mL (100 mg total) into the muscle every 30 (thirty) days. 1 mL 0  . loperamide (IMODIUM A-D) 2 MG tablet Take 2 mg by mouth 4 (four) times daily as needed for diarrhea or loose stools.    . Menthol (HONEY LEMON COUGH DROPS MT) Use as directed 1 lozenge in the mouth or throat 4 (four) times daily as needed (cough).    . metFORMIN (GLUCOPHAGE-XR) 500 MG 24 hr tablet Take 1,000 mg by mouth 2 (two) times daily.     . metoprolol tartrate (LOPRESSOR) 25 MG tablet Take 0.5 tablets (12.5 mg total) by mouth 2 (two) times daily. (Patient taking differently: Take 50 mg by mouth 2 (two) times daily. )    . nitroGLYCERIN (NITROSTAT) 0.3 MG SL tablet Place 0.3 mg under the tongue every 5 (five) minutes as needed for chest pain.    Marland Kitchen ondansetron (ZOFRAN) 4 MG tablet Take 4 mg by mouth every 6 (six) hours as needed for nausea or vomiting.    . pantoprazole (PROTONIX) 40 MG tablet Take 40 mg by mouth 2 (two) times daily.    . potassium chloride (K-DUR) 10 MEQ tablet Take 10 mEq by mouth daily.    . pregabalin (LYRICA) 100 MG capsule Take 100 mg by mouth 3 (three) times daily.    Marland Kitchen venlafaxine XR (EFFEXOR-XR) 150 MG 24 hr capsule Take 150 mg by mouth daily with breakfast. Take with 75mg  capsule for a total of 225mg     . venlafaxine XR (EFFEXOR-XR) 75 MG 24 hr capsule Take 75 mg by mouth daily with breakfast. Take with  150mg  capsule for a total of 225mg     . clindamycin (CLEOCIN) 300 MG capsule Take 1 capsule (300 mg total) by mouth 3 (three) times daily for 10 days. 30 capsule 0  . insulin aspart (NOVOLOG) 100 UNIT/ML injection Inject 0-10 Units into the skin 3 (three) times daily before meals.     . Insulin Pen Needle (FIFTY50 PEN NEEDLES) 31G X 8 MM MISC PEN NEEDLES 31G X 6 MM MISC    . oxyCODONE-acetaminophen (PERCOCET/ROXICET) 5-325 MG tablet Take 1 tablet by mouth every 6 (six) hours as needed for up to 3 days for moderate pain. 12 tablet 0     Discharge Medications: STOP taking these medications   doxycycline 100 MG  tablet Commonly known as:  VIBRA-TABS     TAKE these medications   acetaminophen 500 MG tablet Commonly known as:  TYLENOL Take 500 mg by mouth every 8 (eight) hours as needed for mild pain or moderate pain.   albuterol 108 (90 Base) MCG/ACT inhaler Commonly known as:  PROVENTIL HFA;VENTOLIN HFA Inhale 2 puffs into the lungs every 6 (six) hours as needed for shortness of breath.   aspirin EC 81 MG tablet Take 81 mg by mouth daily.   atorvastatin 20 MG tablet Commonly known as:  LIPITOR Take 20 mg by mouth at bedtime.   B-12 1000 MCG Caps Take 1,000 mcg by mouth daily.   busPIRone 10 MG tablet Commonly known as:  BUSPAR Take 10 mg by mouth 2 (two) times daily.   clindamycin 300 MG capsule Commonly known as:  CLEOCIN Take 1 capsule (300 mg total) by mouth 3 (three) times daily for 10 days.   clonazePAM 1 MG tablet Commonly known as:  KLONOPIN Take 1 mg by mouth at bedtime. What changed:  Another medication with the same name was changed. Make sure you understand how and when to take each.   clonazePAM 0.5 MG tablet Commonly known as:  KLONOPIN Take 1 tablet (0.5 mg total) by mouth 2 (two) times daily. What changed:  when to take this   diphenoxylate-atropine 2.5-0.025 MG tablet Commonly known as:  LOMOTIL Take 1 tablet by mouth daily as needed for  diarrhea or loose stools.   docusate sodium 100 MG capsule Commonly known as:  COLACE Take 100 mg by mouth 2 (two) times daily as needed for mild constipation.   ferrous sulfate 325 (65 FE) MG tablet Take 325 mg by mouth 2 (two) times daily with a meal.   FIFTY50 PEN NEEDLES 31G X 8 MM Misc Generic drug:  Insulin Pen Needle PEN NEEDLES 31G X 6 MM MISC   Fluticasone-Salmeterol 250-50 MCG/DOSE Aepb Commonly known as:  ADVAIR Inhale 1 puff into the lungs 2 (two) times daily.   furosemide 20 MG tablet Commonly known as:  LASIX Take 1 tablet (20 mg total) by mouth 2 (two) times daily. What changed:  when to take this   haloperidol decanoate 100 MG/ML injection Commonly known as:  HALDOL DECANOATE Inject 1 mL (100 mg total) into the muscle every 30 (thirty) days.   HONEY LEMON COUGH DROPS MT Use as directed 1 lozenge in the mouth or throat 4 (four) times daily as needed (cough).   insulin aspart 100 UNIT/ML injection Commonly known as:  novoLOG Inject 0-10 Units into the skin 3 (three) times daily before meals.   loperamide 2 MG tablet Commonly known as:  IMODIUM A-D Take 2 mg by mouth 4 (four) times daily as needed for diarrhea or loose stools.   metFORMIN 500 MG 24 hr tablet Commonly known as:  GLUCOPHAGE-XR Take 1,000 mg by mouth 2 (two) times daily.   metoprolol tartrate 25 MG tablet Commonly known as:  LOPRESSOR Take 0.5 tablets (12.5 mg total) by mouth 2 (two) times daily. What changed:  how much to take   nitroGLYCERIN 0.3 MG SL tablet Commonly known as:  NITROSTAT Place 0.3 mg under the tongue every 5 (five) minutes as needed for chest pain.   ondansetron 4 MG tablet Commonly known as:  ZOFRAN Take 4 mg by mouth every 6 (six) hours as needed for nausea or vomiting.   oxyCODONE-acetaminophen 5-325 MG tablet Commonly known as:  PERCOCET/ROXICET Take 1 tablet by mouth  every 6 (six) hours as needed for up to 3 days for moderate pain.    pantoprazole 40 MG tablet Commonly known as:  PROTONIX Take 40 mg by mouth 2 (two) times daily.   potassium chloride 10 MEQ tablet Commonly known as:  K-DUR Take 10 mEq by mouth daily.   pregabalin 100 MG capsule Commonly known as:  LYRICA Take 100 mg by mouth 3 (three) times daily.   venlafaxine XR 75 MG 24 hr capsule Commonly known as:  EFFEXOR-XR Take 75 mg by mouth daily with breakfast. Take with 150mg  capsule for a total of 225mg    venlafaxine XR 150 MG 24 hr capsule Commonly known as:  EFFEXOR-XR Take 150 mg by mouth daily with breakfast. Take with 75mg  capsule for a total of 225mg       Relevant Imaging Results:  Relevant Lab Results:   Additional Information SSN: [id number]      Anell Barr

## 2017-06-22 NOTE — Clinical Social Work Note (Signed)
Patient to be d/c'ed today to [facility].  Patient and family agreeable to plans will transport via facility transportation RN to call report.  FL2 and Discharge summary have been faxed to ALF, and [facility] is aware that patient discharging today.  Evette Cristal, MSW, [place]

## 2017-06-25 LAB — CULTURE, BLOOD (ROUTINE X 2)
Culture: NO GROWTH
Culture: NO GROWTH
Specimen Description: ADEQUATE
Specimen Description: ADEQUATE

## 2017-07-29 ENCOUNTER — Other Ambulatory Visit: Payer: Self-pay

## 2017-07-30 ENCOUNTER — Other Ambulatory Visit: Payer: Self-pay | Admitting: Urgent Care

## 2017-07-30 ENCOUNTER — Inpatient Hospital Stay: Payer: Medicare Other | Attending: Hematology and Oncology

## 2017-07-30 DIAGNOSIS — D500 Iron deficiency anemia secondary to blood loss (chronic): Secondary | ICD-10-CM

## 2017-07-30 DIAGNOSIS — E538 Deficiency of other specified B group vitamins: Secondary | ICD-10-CM | POA: Diagnosis not present

## 2017-07-30 DIAGNOSIS — D509 Iron deficiency anemia, unspecified: Secondary | ICD-10-CM | POA: Insufficient documentation

## 2017-07-30 LAB — CBC WITH DIFFERENTIAL/PLATELET
Basophils Absolute: 0.1 10*3/uL (ref 0–0.1)
Basophils Relative: 1 %
Eosinophils Absolute: 0.1 10*3/uL (ref 0–0.7)
Eosinophils Relative: 1 %
HCT: 35.1 % (ref 35.0–47.0)
Hemoglobin: 11.6 g/dL — ABNORMAL LOW (ref 12.0–16.0)
Lymphocytes Relative: 10 %
Lymphs Abs: 0.8 10*3/uL — ABNORMAL LOW (ref 1.0–3.6)
MCH: 25.7 pg — ABNORMAL LOW (ref 26.0–34.0)
MCHC: 32.9 g/dL (ref 32.0–36.0)
MCV: 78.2 fL — ABNORMAL LOW (ref 80.0–100.0)
Monocytes Absolute: 0.5 10*3/uL (ref 0.2–0.9)
Monocytes Relative: 7 %
Neutro Abs: 6.3 10*3/uL (ref 1.4–6.5)
Neutrophils Relative %: 81 %
Platelets: 274 10*3/uL (ref 150–440)
RBC: 4.5 MIL/uL (ref 3.80–5.20)
RDW: 22.6 % — ABNORMAL HIGH (ref 11.5–14.5)
WBC: 7.8 10*3/uL (ref 3.6–11.0)

## 2017-07-30 LAB — FERRITIN: Ferritin: 6 ng/mL — ABNORMAL LOW (ref 11–307)

## 2017-07-30 NOTE — Progress Notes (Signed)
Ferritin low at 6. Will bring back to clinic for Feraheme 510 mg x 1 dose.   Honor Loh, MSN, APRN, FNP-C, CEN Oncology/Hematology Nurse Practitioner  [facility] 07/30/17 1:02 PM

## 2017-08-06 ENCOUNTER — Telehealth: Payer: Self-pay | Admitting: *Deleted

## 2017-08-06 ENCOUNTER — Inpatient Hospital Stay: Payer: Medicare Other

## 2017-08-06 NOTE — Telephone Encounter (Signed)
Mickel Baas from [facility] called reporting that patient has said she will NOT come back for another infusion. She has an appointment today for infusion. Mickel Baas asks that we return her call [phone]

## 2017-08-06 NOTE — Telephone Encounter (Signed)
Appointment cancelled

## 2017-08-07 NOTE — Telephone Encounter (Signed)
Call returned, however there was no answer. I called on a couple of different occassions on Friday (08/06/2017). I have discussed this with Dr. Mike Gip. We cannot for her to come. Her iron deficiency will progress to the point when she will become increasingly symptomatic. If she is adamant about not returning for infusions, I would encourage her to take oral iron, with a source of vitamin C, in efforts to stay ahead of her documented deficiency. If we have her on infusions, there has to be a reason. She has either failed oral therapy, or was intolerant.   Honor Loh, MSN, APRN, FNP-C, CEN Oncology/Hematology Nurse Practitioner  [facility]

## 2017-09-07 ENCOUNTER — Inpatient Hospital Stay: Payer: Medicare Other

## 2017-09-09 ENCOUNTER — Inpatient Hospital Stay: Payer: Medicare Other

## 2017-09-09 ENCOUNTER — Inpatient Hospital Stay: Payer: Medicare Other | Admitting: Hematology and Oncology

## 2018-11-11 IMAGING — CT CT NECK W/ CM
4 of 5 series · 14 of 35 positions shown, 16 images · IV contrast (iopamidol)
Comparison: None.

CLINICAL DATA: Initial evaluation for acute pain and swelling at
left neck extending towards chest.

EXAM:
CT NECK WITH CONTRAST
TECHNIQUE: Multidetector CT imaging of the neck was performed using the
standard protocol following the bolus administration of intravenous
contrast.
CONTRAST:  75mL 7JSKLS-CWV IOPAMIDOL (7JSKLS-CWV) INJECTION 76%

[Series 2: axial neck · axial · 0.55mm/px · z∈[-272,-216]mm · 2 of 111 slices shown]
[im 28/111  bone]
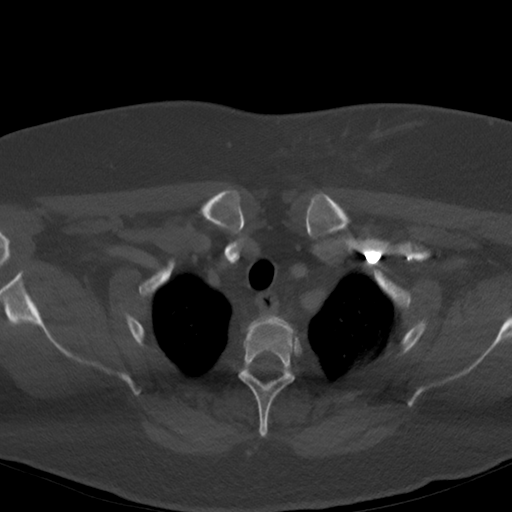
[im 56/111  bone]
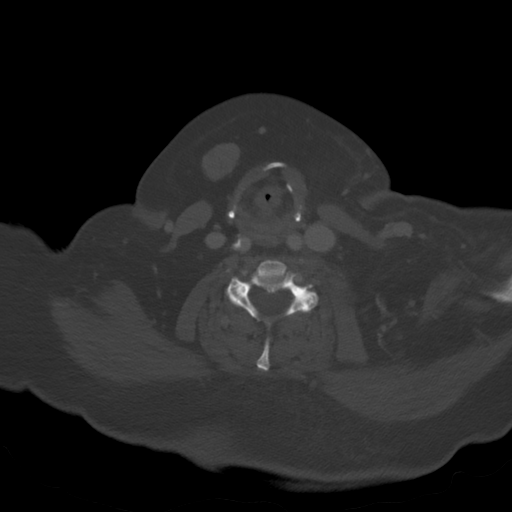

[Series 6: sag neck · sagittal · 0.45mm/px · 5 of 106 slices shown, 6 images]
[im 36/106  bone]
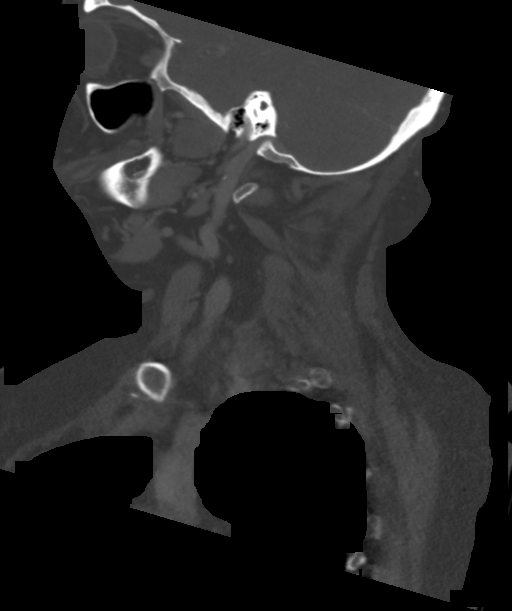
[im 44/106  bone]
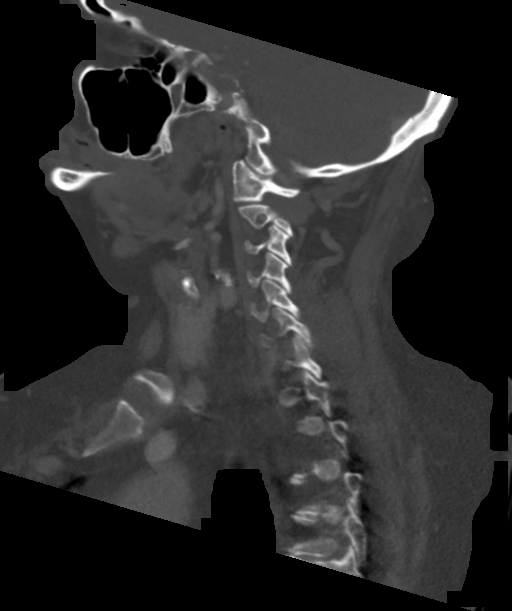
[im 53/106  soft-tissue]
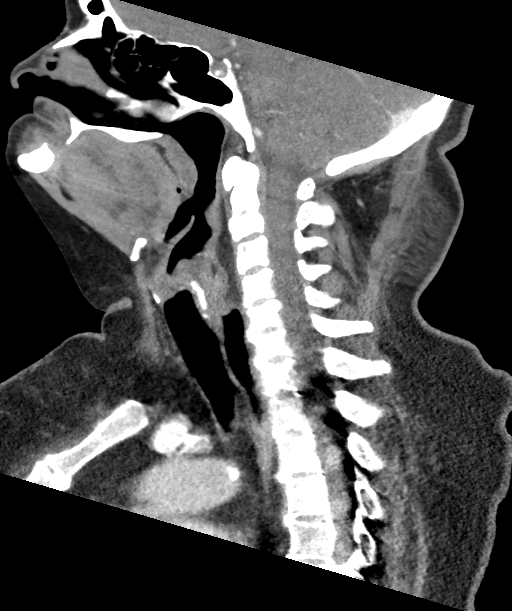
[im 53/106  bone]
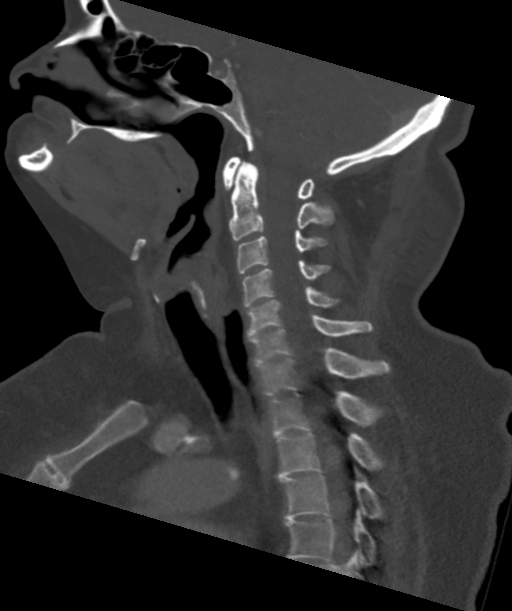
[im 62/106  bone]
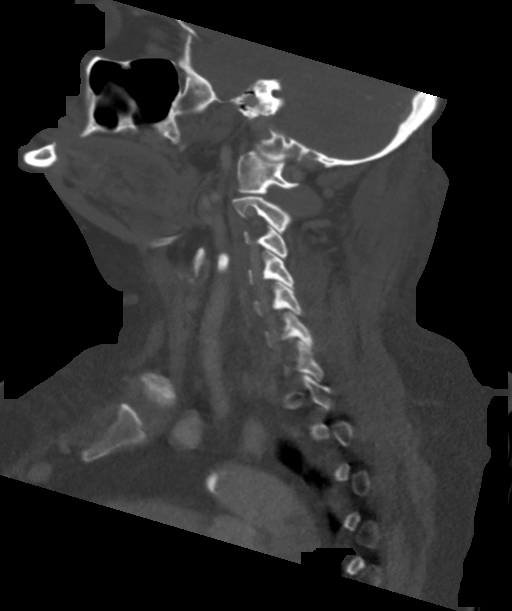
[im 71/106  bone]
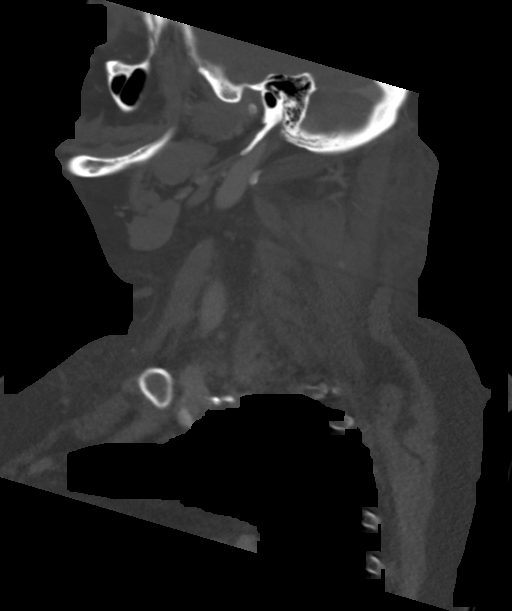

[Series 7: cor neck · coronal · 0.51mm/px · 3 of 107 slices shown]
[im 22/107  bone]
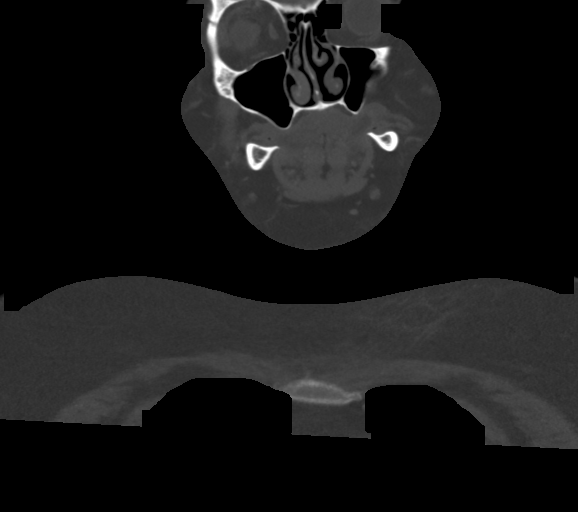
[im 43/107  bone]
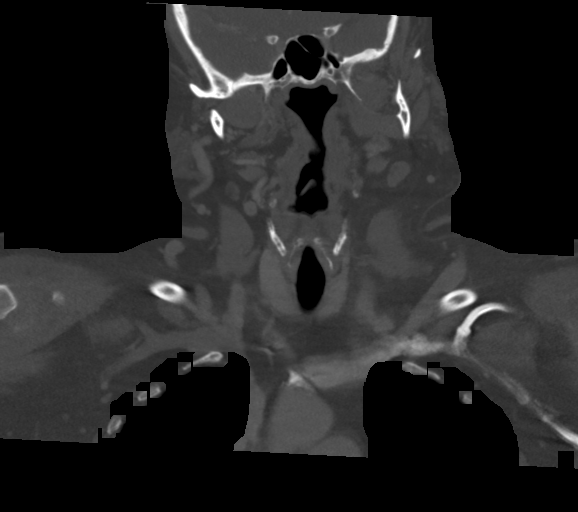
[im 64/107  bone]
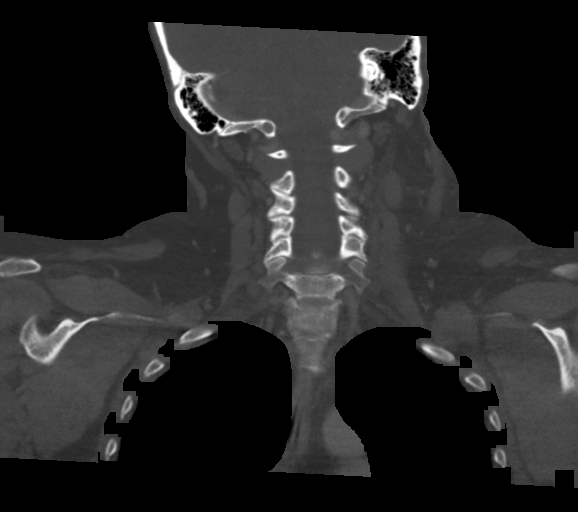

[Series 8: orthogonal ax · axial · 0.40mm/px · z∈[-323,-174]mm · 4 of 130 slices shown, 5 images]
[im 26/130  soft-tissue]
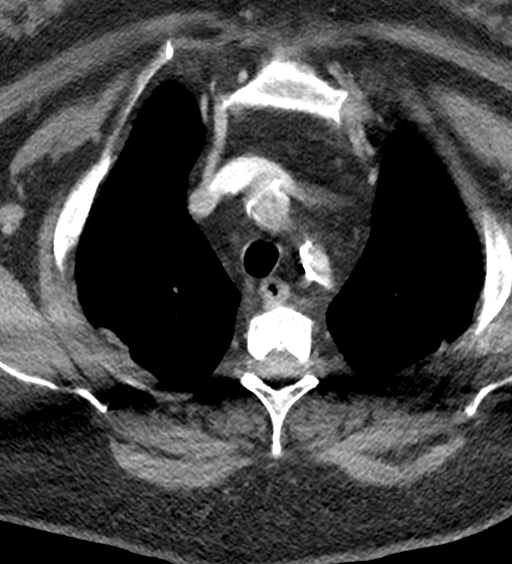
[im 26/130  bone]
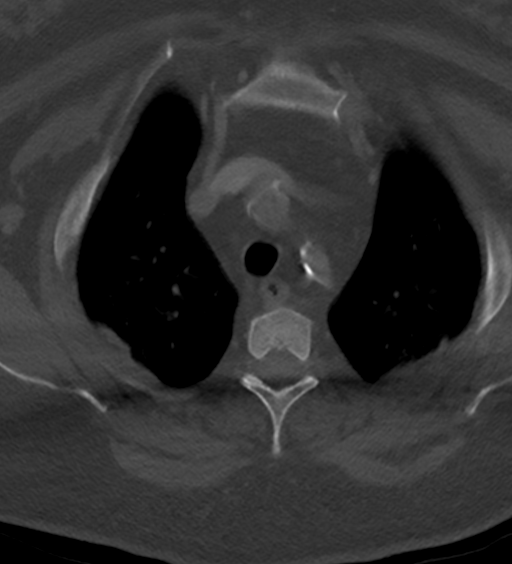
[im 52/130  bone]
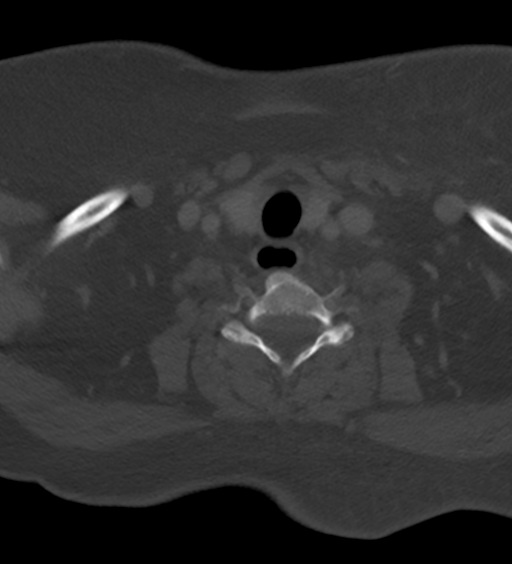
[im 78/130  bone]
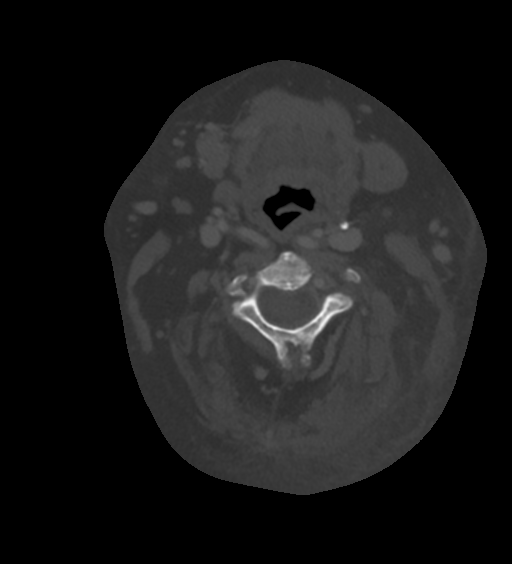
[im 104/130  bone]
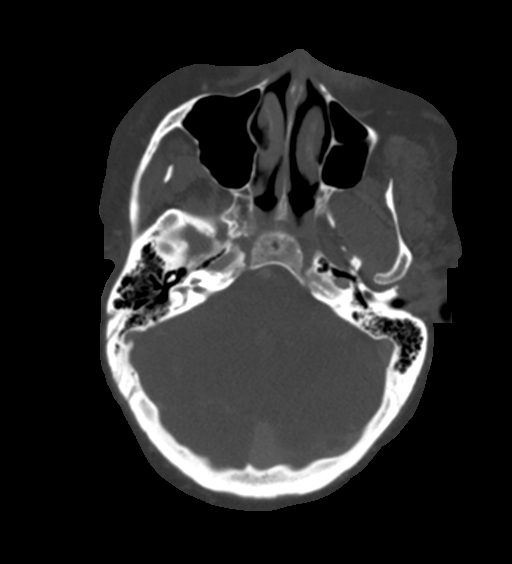

[14 of 35 positions shown; findings below may reference images not displayed]

FINDINGS: Pharynx and larynx: Oral cavity within normal limits without mass
lesion or loculated fluid collection. Oropharynx and nasopharynx
within normal limits. No retropharyngeal collection. Remainder of
the hypopharynx and supraglottic larynx within normal limits.
Epiglottis normal. Vallecula clear. True cords apposed and not well
evaluated. Subglottic airway clear.

Salivary glands: Salivary glands including the parotid and
submandibular glands are normal.

Thyroid: Thyroid normal.

Lymph nodes: Few mildly prominent nodes within the left posterior
neck measure up to 7 mm, likely reactive. No other adenopathy within
the neck.

Vascular: Normal intravascular enhancement seen throughout the neck.
Atherosclerotic change present about the aortic arch and carotid
bifurcations.

Limited intracranial: Unremarkable.

Visualized orbits: Visualized globes within normal limits. 16 mm
well-circumscribed hyperdense lesion at the posterior aspect of the
left orbit noted, indeterminate, but grossly similar relative to
previous scans dating back to 2837. Orbital soft tissues otherwise
unremarkable.

Mastoids and visualized paranasal sinuses: Paranasal sinuses and
mastoid air cells are clear. Middle ear cavities are well
pneumatized and clear.

Skeleton: No acute osseous abnormality. No worrisome lytic or
blastic osseous lesions.

Upper chest: Visualized upper mediastinum within normal limits.
Visualized lungs are clear.

Other: Asymmetric soft tissue swelling with inflammatory stranding
present within the subcutaneous fat of the left posterior neck
(series 2, image 31). Overlying skin thickening. Extension into the
underlying left posterior paraspinous musculature without deeper
extension into the neck at this time. Inferior extension down the
lateral left neck into the upper left anterior chest wall (series 2,
image 74). Findings concerning for infection/cellulitis. Slightly
more confluent area of focal phlegmon noted without definite
discrete abscess or drainable fluid collection (series 2, image 31).
Few small reactive lymph nodes noted within this region.
IMPRESSION: 1. Soft tissue swelling with inflammatory stranding and skin
thickening in the left posterior neck, extending inferiorly down the
left neck into the upper left anterior chest wall. Findings
concerning for acute infection/cellulitis. No discrete abscess or
drainable fluid collection at this time. Extension into the
underlying left posterior paraspinous musculature without deeper
extension into the neck.
2. 16 mm well-circumscribed intraorbital lesion at the posterior
left orbit, indeterminate, but grossly similar as compared to prior
CTs dating back to 2837. Follow-up examination with nonemergent MRI
suggested for further characterization.

## 2018-12-03 ENCOUNTER — Emergency Department: Payer: Medicare Other

## 2018-12-03 ENCOUNTER — Other Ambulatory Visit: Payer: Self-pay

## 2018-12-03 ENCOUNTER — Emergency Department
Admission: EM | Admit: 2018-12-03 | Discharge: 2018-12-03 | Disposition: A | Discharge status: Skilled Nursing Facility | Payer: Medicare Other | Attending: Student | Admitting: Student

## 2018-12-03 DIAGNOSIS — J449 Chronic obstructive pulmonary disease, unspecified: Secondary | ICD-10-CM | POA: Insufficient documentation

## 2018-12-03 DIAGNOSIS — J45909 Unspecified asthma, uncomplicated: Secondary | ICD-10-CM | POA: Diagnosis not present

## 2018-12-03 DIAGNOSIS — I110 Hypertensive heart disease with heart failure: Secondary | ICD-10-CM | POA: Diagnosis not present

## 2018-12-03 DIAGNOSIS — R0789 Other chest pain: Secondary | ICD-10-CM | POA: Diagnosis not present

## 2018-12-03 DIAGNOSIS — E119 Type 2 diabetes mellitus without complications: Secondary | ICD-10-CM | POA: Insufficient documentation

## 2018-12-03 DIAGNOSIS — F17210 Nicotine dependence, cigarettes, uncomplicated: Secondary | ICD-10-CM | POA: Insufficient documentation

## 2018-12-03 DIAGNOSIS — I509 Heart failure, unspecified: Secondary | ICD-10-CM | POA: Insufficient documentation

## 2018-12-03 DIAGNOSIS — Z794 Long term (current) use of insulin: Secondary | ICD-10-CM | POA: Insufficient documentation

## 2018-12-03 DIAGNOSIS — Z79899 Other long term (current) drug therapy: Secondary | ICD-10-CM | POA: Diagnosis not present

## 2018-12-03 DIAGNOSIS — Z7982 Long term (current) use of aspirin: Secondary | ICD-10-CM | POA: Diagnosis not present

## 2018-12-03 DIAGNOSIS — R079 Chest pain, unspecified: Secondary | ICD-10-CM

## 2018-12-03 LAB — FIBRIN DERIVATIVES D-DIMER (ARMC ONLY): Fibrin derivatives D-dimer (ARMC): 332.97 ng/mL (FEU) (ref 0.00–499.00)

## 2018-12-03 LAB — CBC WITH DIFFERENTIAL/PLATELET
Abs Immature Granulocytes: 0.02 10*3/uL (ref 0.00–0.07)
Basophils Absolute: 0 10*3/uL (ref 0.0–0.1)
Basophils Relative: 0 %
Eosinophils Absolute: 0.1 10*3/uL (ref 0.0–0.5)
Eosinophils Relative: 1 %
HCT: 35.5 % — ABNORMAL LOW (ref 36.0–46.0)
Hemoglobin: 11.4 g/dL — ABNORMAL LOW (ref 12.0–15.0)
Immature Granulocytes: 0 %
Lymphocytes Relative: 16 %
Lymphs Abs: 1.1 10*3/uL (ref 0.7–4.0)
MCH: 26 pg (ref 26.0–34.0)
MCHC: 32.1 g/dL (ref 30.0–36.0)
MCV: 81.1 fL (ref 80.0–100.0)
Monocytes Absolute: 0.6 10*3/uL (ref 0.1–1.0)
Monocytes Relative: 8 %
Neutro Abs: 5.2 10*3/uL (ref 1.7–7.7)
Neutrophils Relative %: 75 %
Platelets: 212 10*3/uL (ref 150–400)
RBC: 4.38 MIL/uL (ref 3.87–5.11)
RDW: 15.8 % — ABNORMAL HIGH (ref 11.5–15.5)
WBC: 7 10*3/uL (ref 4.0–10.5)
nRBC: 0 % (ref 0.0–0.2)

## 2018-12-03 LAB — TROPONIN I (HIGH SENSITIVITY)
Troponin I (High Sensitivity): 2 ng/L (ref <18)
Troponin I (High Sensitivity): 2 ng/L (ref <18)

## 2018-12-03 LAB — URINALYSIS, COMPLETE (UACMP) WITH MICROSCOPIC
Bacteria, UA: NONE SEEN
Bilirubin Urine: NEGATIVE
Glucose, UA: NEGATIVE mg/dL
Hgb urine dipstick: NEGATIVE
Ketones, ur: NEGATIVE mg/dL
Leukocytes,Ua: NEGATIVE
Nitrite: NEGATIVE
Protein, ur: NEGATIVE mg/dL
Specific Gravity, Urine: 1 — ABNORMAL LOW (ref 1.005–1.030)
Squamous Epithelial / LPF: NONE SEEN (ref 0–5)
pH: 7 (ref 5.0–8.0)

## 2018-12-03 LAB — COMPREHENSIVE METABOLIC PANEL
ALT: 13 U/L (ref 0–44)
AST: 14 U/L — ABNORMAL LOW (ref 15–41)
Albumin: 3.9 g/dL (ref 3.5–5.0)
Alkaline Phosphatase: 89 U/L (ref 38–126)
Anion gap: 9 (ref 5–15)
BUN: 8 mg/dL (ref 6–20)
CO2: 29 mmol/L (ref 22–32)
Calcium: 9.1 mg/dL (ref 8.9–10.3)
Chloride: 99 mmol/L (ref 98–111)
Creatinine, Ser: 0.5 mg/dL (ref 0.44–1.00)
GFR calc Af Amer: >60 mL/min (ref >60)
GFR calc non Af Amer: >60 mL/min (ref >60)
Glucose, Bld: 126 mg/dL — ABNORMAL HIGH (ref 70–99)
Potassium: 3.9 mmol/L (ref 3.5–5.1)
Sodium: 137 mmol/L (ref 135–145)
Total Bilirubin: 0.5 mg/dL (ref 0.3–1.2)
Total Protein: 6.8 g/dL (ref 6.5–8.1)

## 2018-12-03 LAB — PROTIME-INR
INR: 1 (ref 0.8–1.2)
Prothrombin Time: 12.7 seconds (ref 11.4–15.2)

## 2018-12-03 LAB — BRAIN NATRIURETIC PEPTIDE: B Natriuretic Peptide: 18 pg/mL (ref 0.0–100.0)

## 2018-12-03 LAB — APTT: aPTT: 31 seconds (ref 24–36)

## 2018-12-03 NOTE — ED Notes (Signed)
Attempted to call [facility], left message

## 2018-12-03 NOTE — ED Notes (Signed)
Pt ambulated to toilet with walker. Pt appears steady.

## 2018-12-03 NOTE — ED Provider Notes (Signed)
[facility] Emergency Department Provider Note  ____________________________________________  Time seen: Approximately 2:17 PM  I have reviewed the triage vital signs and the nursing notes.   HISTORY  Chief Complaint Chest Pain    HPI Cristina [name] is a 58 y.o. female who presents the emergency department via EMS for complaint of substernal chest pain.   Patient presents from [facility] after complaining of chest pain that began after lunch.  Patient reports that she ate lunch, then began to develop substernal pain.  Patient reports that increased and felt like a heavy weight on her chest.  She denied any shortness of breath with this associated chest pain.  EMS was called, patient had 3 sublingual nitro, 324 of aspirin.  Patient reports that the medications improved her chest pain somewhat however she is still experiencing substernal and left-sided chest pain.  EMS reports normal sinus rhythm with occasional PVCs but no indication of STEMI on initial EKG.  Patient does have a history of MI in the past and states that it was recommended that she have stents placed.  Patient states that she did not wish to pursue stents at the time.  She is unable to inform me when her previous MI was.  Patient has a history of abdominal hernia, CHF, COPD, diabetes, GERD, hypertension, CVA, sleep apnea, schizoaffective disorder, recurrent UTIs.  Patient denies any recent symptoms to include fevers and chills, nasal congestion, increasing shortness of breath, abdominal pain, nausea vomiting, GERD-like symptoms.        Past Medical History:  Diagnosis Date  . Abdominal hernia   . Arthritis   . Asthma   . Bladder dysfunction   . Carpal tunnel syndrome   . CHF (congestive heart failure) ([place])   . COPD (chronic obstructive pulmonary disease) ([place])   . DDD (degenerative disc disease), lumbar   . Diabetes mellitus without complication ([place])   . Dyspnea   . Edema   .  Fatty tumor   . GERD (gastroesophageal reflux disease)   . H/O colonoscopy   . H/O tubal ligation    hx btl  . History of cholecystectomy    hx of cholecystectomy  . HLD (hyperlipidemia)   . Hypertension   . Muscle cramps   . Neuropathy   . Obesity   . Recurrent boils   . Schizoaffective disorder ([place])   . Sleep apnea   . Stroke ([place])   . UTI (lower urinary tract infection)   . Vaginitis     Patient Active Problem List   Diagnosis Date Noted  . Cellulitis of neck 06/20/2017  . AVM (arteriovenous malformation) of small bowel, acquired with hemorrhage   . Goals of care, counseling/discussion 05/17/2017  . Iron deficiency anemia 05/10/2017  . B12 deficiency 05/10/2017  . GI bleed 03/06/2017  . Abdominal pain   . Symptomatic anemia   . Acute posthemorrhagic anemia   . Heme + stool   . Sepsis ([place]) 07/15/2016  . Septic shock ([place]) 10/28/2015  . COPD (chronic obstructive pulmonary disease) ([place]) 07/31/2015  . Aspirin overdose 07/30/2015  . Schizoaffective disorder, bipolar type ([place]) 12/05/2014  . HTN (hypertension) 12/05/2014  . Sleep apnea 12/05/2014  . Diabetes ([place]) 12/05/2014  . Incomplete bladder emptying 12/04/2014  . Recurrent UTI 12/04/2014  . Polyneuropathy 03/20/2009  . Arthritis, degenerative 05/23/2007  . HLD (hyperlipidemia) 05/23/2007    Past Surgical History:  Procedure Laterality Date  . CARDIAC CATHETERIZATION Left 09/24/2014   Procedure: Left Heart Cath and  Coronary Angiography;  Surgeon: Dionisio David, MD;  Location: [place] CV LAB;  Service: Cardiovascular;  Laterality: Left;  . CHOLECYSTECTOMY    . COLONOSCOPY WITH PROPOFOL N/A 08/12/2015   Procedure: COLONOSCOPY WITH PROPOFOL;  Surgeon: Lollie Sails, MD;  Location: [facility] ENDOSCOPY;  Service: Endoscopy;  Laterality: N/A;  . COLONOSCOPY WITH PROPOFOL N/A 04/23/2017   Procedure: COLONOSCOPY WITH PROPOFOL;  Surgeon: Jonathon Bellows, MD;  Location: [facility] ENDOSCOPY;  Service: Endoscopy;  Laterality:  N/A;  . ENTEROSCOPY N/A 05/28/2017   Procedure: SMALL BOWEL ENTEROSCOPY;  Surgeon: Lin Landsman, MD;  Location: [facility] ENDOSCOPY;  Service: Gastroenterology;  Laterality: N/A;  . ENTEROSCOPY N/A 06/04/2017   Procedure: RETROGRADE SMALL BOWEL ENTEROSCOPY;  Surgeon: Lin Landsman, MD;  Location: [facility] ENDOSCOPY;  Service: Gastroenterology;  Laterality: N/A;  . ESOPHAGOGASTRODUODENOSCOPY (EGD) WITH PROPOFOL N/A 08/12/2015   Procedure: ESOPHAGOGASTRODUODENOSCOPY (EGD) WITH PROPOFOL;  Surgeon: Lollie Sails, MD;  Location: [facility] ENDOSCOPY;  Service: Endoscopy;  Laterality: N/A;  . ESOPHAGOGASTRODUODENOSCOPY (EGD) WITH PROPOFOL N/A 03/06/2017   Procedure: ESOPHAGOGASTRODUODENOSCOPY (EGD) WITH PROPOFOL;  Surgeon: Lucilla Lame, MD;  Location: [facility] ENDOSCOPY;  Service: Endoscopy;  Laterality: N/A;  . GIVENS CAPSULE STUDY N/A 05/25/2017   Procedure: GIVENS CAPSULE STUDY;  Surgeon: Lucilla Lame, MD;  Location: [facility] ENDOSCOPY;  Service: Endoscopy;  Laterality: N/A;  . tubal ligation      Prior to Admission medications   Medication Sig Start Date End Date Taking? Authorizing Provider  acetaminophen (TYLENOL) 500 MG tablet Take 500 mg by mouth every 8 (eight) hours as needed for mild pain or moderate pain.    [provider]  albuterol (PROVENTIL HFA;VENTOLIN HFA) 108 (90 Base) MCG/ACT inhaler Inhale 2 puffs into the lungs every 6 (six) hours as needed for shortness of breath.    [provider]  aspirin EC 81 MG tablet Take 81 mg by mouth daily.    [provider]  atorvastatin (LIPITOR) 20 MG tablet Take 20 mg by mouth at bedtime.    [provider]  busPIRone (BUSPAR) 10 MG tablet Take 10 mg by mouth 2 (two) times daily.     [provider]  clonazePAM (KLONOPIN) 0.5 MG tablet Take 1 tablet (0.5 mg total) by mouth 2 (two) times daily. Patient taking differently: Take 0.5 mg by mouth daily.  07/18/16   Gouru, Illene Silver, MD  clonazePAM (KLONOPIN) 1 MG tablet  Take 1 mg by mouth at bedtime.    [provider]  Cyanocobalamin (B-12) 1000 MCG CAPS Take 1,000 mcg by mouth daily. 05/10/17   Karen Kitchens, NP  diphenoxylate-atropine (LOMOTIL) 2.5-0.025 MG tablet Take 1 tablet by mouth daily as needed for diarrhea or loose stools.    [provider]  docusate sodium (COLACE) 100 MG capsule Take 100 mg by mouth 2 (two) times daily as needed for mild constipation.    [provider]  ferrous sulfate 325 (65 FE) MG tablet Take 325 mg by mouth 2 (two) times daily with a meal.    [provider]  Fluticasone-Salmeterol (ADVAIR) 250-50 MCG/DOSE AEPB Inhale 1 puff into the lungs 2 (two) times daily.    [provider]  furosemide (LASIX) 20 MG tablet Take 1 tablet (20 mg total) by mouth 2 (two) times daily. Patient taking differently: Take 20 mg by mouth daily.  07/18/16   Nicholes Mango, MD  haloperidol decanoate (HALDOL DECANOATE) 100 MG/ML injection Inject 1 mL (100 mg total) into the muscle every 30 (thirty) days. 08/02/15  Hildred Priest, MD  insulin aspart (NOVOLOG) 100 UNIT/ML injection Inject 0-10 Units into the skin 3 (three) times daily before meals.     [provider]  Insulin Pen Needle (FIFTY50 PEN NEEDLES) 31G X 8 MM MISC PEN NEEDLES 31G X 6 MM MISC 10/07/12   [provider]  loperamide (IMODIUM A-D) 2 MG tablet Take 2 mg by mouth 4 (four) times daily as needed for diarrhea or loose stools.    [provider]  Menthol (HONEY LEMON COUGH DROPS MT) Use as directed 1 lozenge in the mouth or throat 4 (four) times daily as needed (cough).    [provider]  metFORMIN (GLUCOPHAGE-XR) 500 MG 24 hr tablet Take 1,000 mg by mouth 2 (two) times daily.  05/23/15   [provider]  metoprolol tartrate (LOPRESSOR) 25 MG tablet Take 0.5 tablets (12.5 mg total) by mouth 2 (two) times daily. Patient taking differently: Take 50 mg by mouth 2 (two) times daily.  07/18/16   Nicholes Mango, MD  nitroGLYCERIN (NITROSTAT) 0.3 MG SL tablet Place 0.3 mg under the tongue every 5 (five) minutes as needed for chest pain.    [provider]  ondansetron (ZOFRAN) 4 MG tablet Take 4 mg by mouth every 6 (six) hours as needed for nausea or vomiting.    [provider]  pantoprazole (PROTONIX) 40 MG tablet Take 40 mg by mouth 2 (two) times daily.    [provider]  potassium chloride (K-DUR) 10 MEQ tablet Take 10 mEq by mouth daily.    [provider]  pregabalin (LYRICA) 100 MG capsule Take 100 mg by mouth 3 (three) times daily.    [provider]  venlafaxine XR (EFFEXOR-XR) 150 MG 24 hr capsule Take 150 mg by mouth daily with breakfast. Take with 75mg  capsule for a total of 225mg     [provider]  venlafaxine XR (EFFEXOR-XR) 75 MG 24 hr capsule Take 75 mg by mouth daily with breakfast. Take with 150mg  capsule for a total of 225mg     [provider]    Allergies Bee venom, Shellfish allergy, Shellfish-derived products, Codeine, Thorazine [chlorpromazine], Tylenol with codeine #3 [acetaminophen-codeine], and Metronidazole  Family History  Problem Relation Age of Onset  . CAD Mother   . Lung cancer Mother   . Mental illness Mother   . Mental illness Father 47       suicided   . Mental illness Sister   . CAD Sister   . Mental illness Brother   . CAD Sister     Social History Social History   Tobacco Use  . Smoking status: Current Some Day Smoker    Packs/day: 0.25    Years: 40.00    Pack years: 10.00    Types: Cigarettes  . Smokeless tobacco: Former Systems developer    Types: Chew  Substance Use Topics  . Alcohol use: No    Alcohol/week: 0.0 standard drinks  . Drug use: No     Review of Systems  Constitutional: No fever/chills Eyes: No visual changes. No discharge ENT: No upper respiratory complaints. Cardiovascular: Positive for substernal and left-sided chest pain. Respiratory: no cough. No  SOB. Gastrointestinal: No abdominal pain.  No reflux symptoms.  No nausea, no vomiting.  No diarrhea.  No constipation. Genitourinary: Negative for dysuria. No hematuria Musculoskeletal: Negative for musculoskeletal pain. Skin: Negative for rash, abrasions, lacerations, ecchymosis. Neurological: Negative for headaches, focal weakness or numbness. 10-point ROS otherwise negative.  ____________________________________________   PHYSICAL  EXAM:  VITAL SIGNS: ED Triage Vitals [12/03/18 1409]  Enc Vitals Group     BP (!) 151/77     Pulse Rate 86     Resp 12     Temp 98.3 F (36.8 C)     Temp Source Oral     SpO2 98 %     Weight 210 lb (95.3 kg)     Height 4\' 9"  (1.448 m)     Head Circumference      Peak Flow      Pain Score 10     Pain Loc      Pain Edu?      Excl. in [place]?      Constitutional: Alert and oriented. Well appearing and in no acute distress. Eyes: Conjunctivae are normal. PERRL. EOMI. Head: Atraumatic. ENT:      Ears:       Nose: No congestion/rhinnorhea.      Mouth/Throat: Mucous membranes are moist.  Oropharynx is nonerythematous and nonedematous.  Uvula is midline. Neck: No stridor.  Neck is supple full range of motion Hematological/Lymphatic/Immunilogical: No cervical lymphadenopathy. Cardiovascular: Normal rate, regular rhythm. Normal S1 and S2.  No murmurs, rubs, gallops.  Good peripheral circulation. Respiratory: Normal respiratory effort without tachypnea or retractions. Lungs CTAB. Good air entry to the bases with no decreased or absent breath sounds. Gastrointestinal: Bowel sounds 4 quadrants. Soft and nontender to palpation. No guarding or rigidity. No palpable masses. No distention. No CVA tenderness. Musculoskeletal: Full range of motion to all extremities. No gross deformities appreciated. Neurologic:  Normal speech and language. No gross focal neurologic deficits are appreciated.  Skin:  Skin is warm, dry and intact. No rash noted. Psychiatric:  Mood and affect are normal. Speech and behavior are normal. Patient exhibits appropriate insight and judgement.   ____________________________________________   LABS (all labs ordered are listed, but only abnormal results are displayed)  Labs Reviewed  COMPREHENSIVE METABOLIC PANEL - Abnormal; Notable for the following components:      Result Value   Glucose, Bld 126 (*)    AST 14 (*)    All other components within normal limits  CBC WITH DIFFERENTIAL/PLATELET - Abnormal; Notable for the following components:   Hemoglobin 11.4 (*)    HCT 35.5 (*)    RDW 15.8 (*)    All other components within normal limits  URINALYSIS, COMPLETE (UACMP) WITH MICROSCOPIC - Abnormal; Notable for the following components:   Color, Urine COLORLESS (*)    APPearance CLEAR (*)    Specific Gravity, Urine 1.000 (*)    All other components within normal limits  FIBRIN DERIVATIVES D-DIMER ([facility] ONLY)  APTT  PROTIME-INR  BRAIN NATRIURETIC PEPTIDE  TROPONIN I (HIGH SENSITIVITY)  TROPONIN I (HIGH SENSITIVITY)   ____________________________________________  EKG  ED ECG REPORT I, Charline Bills Bricia Taher,  personally viewed and interpreted this ECG.   Date: 12/03/2018  EKG Time: 1414 hrs.  Rate: 94 bpm  Rhythm: unchanged from previous tracings, normal sinus rhythm, RBBB  Axis: Normal axis  Intervals:first-degree A-V block   ST&T Change: No ST elevation or depression noted  Sinus rhythm at 94 bpm.  First-degree heart block.  Right bundle branch block.  No STEMI.  ____________________________________________  RADIOLOGY I personally viewed and evaluated these images as part of my medical decision making, as well as reviewing the written report by the radiologist.  Dg Chest Port 1 View  Result Date: 12/03/2018 CLINICAL DATA:  Acute chest pain today. EXAM: PORTABLE CHEST 1 VIEW  COMPARISON:  08/11/2016 prior radiographs FINDINGS: Cardiomediastinal silhouette is unchanged. Mild chronic peribronchial  thickening again noted. There is no evidence of focal airspace disease, pulmonary edema, suspicious pulmonary nodule/mass, pleural effusion, or pneumothorax. No acute bony abnormalities are identified. IMPRESSION: 1. No evidence of acute cardiopulmonary disease. 2. Mild chronic peribronchial thickening. Electronically Signed   By: Margarette Canada M.D.   On: 12/03/2018 15:05    ____________________________________________    PROCEDURES  Procedure(s) performed:    Procedures    Medications - No data to display   ____________________________________________   INITIAL IMPRESSION / ASSESSMENT AND PLAN / ED COURSE  Pertinent labs & imaging results that were available during my care of the patient were reviewed by me and considered in my medical decision making (see chart for details).  Review of the [place] CSRS was performed in accordance of the [place] prior to dispensing any controlled drugs.           Patient's diagnosis is consistent with nonspecific chest pain.  Patient presented to the emergency department via EMS after complaining of substernal chest pain that began after eating.  Differential included STEMI, non-STEMI, GERD, retained foreign body, nonspecific chest pain.  Patient reports that she received aspirin and nitro with no significant relief of her chest pain.  Initial EKG provided by EMS revealed no evidence of STEMI.  Repeat EKG here revealed no evidence of STEMI.  Patient's labs, chest x-ray are reassuring.  Serial troponins are within normal limits.  At this time, no indication for further work-up.  Patient is stable for discharge.  Vital signs have been stable throughout encounter.  Patient is resting comfortably at this time and states that she is ready to go home.  I have advised the patient to follow-up with cardiology which patient verbalized understanding of.  Patient will be discharged at this time..  No new medications prescribed at this time.  Follow-up with primary care  or cardiology as needed.  Patient is given ED precautions to return to the ED for any worsening or new symptoms.     ____________________________________________  FINAL CLINICAL IMPRESSION(S) / ED DIAGNOSES  Final diagnoses:  Nonspecific chest pain      NEW MEDICATIONS STARTED DURING THIS VISIT:  ED Discharge Orders    None          This chart was dictated using voice recognition software/Dragon. Despite best efforts to proofread, errors can occur which can change the meaning. Any change was purely unintentional.    Darletta Moll, PA-C 12/03/18 1743    Lilia Pro., MD 12/04/18 2219

## 2018-12-03 NOTE — ED Notes (Signed)
Spoke with Leana Roe at [facility] and gave report on pt. Pt to be transported EMS

## 2018-12-03 NOTE — ED Notes (Signed)
Pt may drink per EDP. Pt given diet coke tolerating well.

## 2018-12-03 NOTE — ED Notes (Signed)
Pt sitting on bench because pt states bed is uncomfortable. Pt steady on feet and has walker in front of her.

## 2018-12-03 NOTE — ED Notes (Signed)
This RN contacted pt's daughter Lindaann Pascal at pt's request. No answer

## 2018-12-03 NOTE — ED Triage Notes (Signed)
Pt comes from [facility] with chest pain starting after lunch today. Central chest pressure 10/10. Facility gave 3 nitro with no relief but a large change in bp from [id number] systolic to [id number] systolic. Pt also had 324mg  aspiring with EMS. EKG showed NSR for EMS.

## 2018-12-03 NOTE — ED Notes (Signed)
Pt walked to toilet with walker. Pt steady on feet with walker.

## 2018-12-03 NOTE — ED Notes (Signed)
Pt provided with a phone by this RN to call family/ride home. Pt speaking to family at this time.

## 2020-04-12 ENCOUNTER — Emergency Department
Admission: EM | Admit: 2020-04-12 | Discharge: 2020-04-13 | Disposition: A | Discharge status: Other Acute Inpatient Hospital | Payer: Medicare Other | Attending: Emergency Medicine | Admitting: Emergency Medicine

## 2020-04-12 ENCOUNTER — Other Ambulatory Visit: Payer: Self-pay

## 2020-04-12 ENCOUNTER — Emergency Department: Payer: Medicare Other

## 2020-04-12 DIAGNOSIS — I509 Heart failure, unspecified: Secondary | ICD-10-CM | POA: Diagnosis not present

## 2020-04-12 DIAGNOSIS — I110 Hypertensive heart disease with heart failure: Secondary | ICD-10-CM | POA: Diagnosis not present

## 2020-04-12 DIAGNOSIS — Z7951 Long term (current) use of inhaled steroids: Secondary | ICD-10-CM | POA: Insufficient documentation

## 2020-04-12 DIAGNOSIS — E1142 Type 2 diabetes mellitus with diabetic polyneuropathy: Secondary | ICD-10-CM | POA: Insufficient documentation

## 2020-04-12 DIAGNOSIS — Z794 Long term (current) use of insulin: Secondary | ICD-10-CM | POA: Insufficient documentation

## 2020-04-12 DIAGNOSIS — F17210 Nicotine dependence, cigarettes, uncomplicated: Secondary | ICD-10-CM | POA: Diagnosis not present

## 2020-04-12 DIAGNOSIS — J449 Chronic obstructive pulmonary disease, unspecified: Secondary | ICD-10-CM | POA: Diagnosis not present

## 2020-04-12 DIAGNOSIS — J45909 Unspecified asthma, uncomplicated: Secondary | ICD-10-CM | POA: Insufficient documentation

## 2020-04-12 DIAGNOSIS — Z7982 Long term (current) use of aspirin: Secondary | ICD-10-CM | POA: Insufficient documentation

## 2020-04-12 DIAGNOSIS — R079 Chest pain, unspecified: Secondary | ICD-10-CM | POA: Insufficient documentation

## 2020-04-12 DIAGNOSIS — Z7984 Long term (current) use of oral hypoglycemic drugs: Secondary | ICD-10-CM | POA: Diagnosis not present

## 2020-04-12 DIAGNOSIS — Z8673 Personal history of transient ischemic attack (TIA), and cerebral infarction without residual deficits: Secondary | ICD-10-CM | POA: Diagnosis not present

## 2020-04-12 DIAGNOSIS — Z79899 Other long term (current) drug therapy: Secondary | ICD-10-CM | POA: Insufficient documentation

## 2020-04-12 LAB — CBC WITH DIFFERENTIAL/PLATELET
Abs Immature Granulocytes: 0.03 10*3/uL (ref 0.00–0.07)
Basophils Absolute: 0.1 10*3/uL (ref 0.0–0.1)
Basophils Relative: 1 %
Eosinophils Absolute: 0.2 10*3/uL (ref 0.0–0.5)
Eosinophils Relative: 2 %
HCT: 38.7 % (ref 36.0–46.0)
Hemoglobin: 12.3 g/dL (ref 12.0–15.0)
Immature Granulocytes: 0 %
Lymphocytes Relative: 20 %
Lymphs Abs: 1.7 10*3/uL (ref 0.7–4.0)
MCH: 27 pg (ref 26.0–34.0)
MCHC: 31.8 g/dL (ref 30.0–36.0)
MCV: 84.9 fL (ref 80.0–100.0)
Monocytes Absolute: 0.7 10*3/uL (ref 0.1–1.0)
Monocytes Relative: 8 %
Neutro Abs: 6 10*3/uL (ref 1.7–7.7)
Neutrophils Relative %: 69 %
Platelets: 207 10*3/uL (ref 150–400)
RBC: 4.56 MIL/uL (ref 3.87–5.11)
RDW: 16.4 % — ABNORMAL HIGH (ref 11.5–15.5)
WBC: 8.8 10*3/uL (ref 4.0–10.5)
nRBC: 0 % (ref 0.0–0.2)

## 2020-04-12 LAB — BASIC METABOLIC PANEL
Anion gap: 6 (ref 5–15)
BUN: 13 mg/dL (ref 8–23)
CO2: 31 mmol/L (ref 22–32)
Calcium: 9.2 mg/dL (ref 8.9–10.3)
Chloride: 102 mmol/L (ref 98–111)
Creatinine, Ser: 0.67 mg/dL (ref 0.44–1.00)
GFR, Estimated: >60 mL/min (ref >60)
Glucose, Bld: 116 mg/dL — ABNORMAL HIGH (ref 70–99)
Potassium: 3.5 mmol/L (ref 3.5–5.1)
Sodium: 139 mmol/L (ref 135–145)

## 2020-04-12 LAB — TROPONIN I (HIGH SENSITIVITY)
Troponin I (High Sensitivity): 4 ng/L (ref <18)
Troponin I (High Sensitivity): 4 ng/L (ref <18)

## 2020-04-12 NOTE — ED Provider Notes (Signed)
[facility] Emergency Department Provider Note  ____________________________________________   I have reviewed the triage vital signs and the nursing notes.   HISTORY  Chief Complaint Chest Pain   History limited by: Not Limited   HPI Cristina [name] is a 60 y.o. female who presents to the emergency department today because of concerns for chest pain.  It started this evening shortly after she finished eating.  She describes it as a sharp pain.  Located in the center left chest.  She denies any associated shortness of breath.  The pain did radiate down her left arm.  Patient was given nitroglycerin at the facility without any significant relief.  She was given aspirin by EMS.  Patient states that she was told at 1 time she would need stents placed in her heart but she was too scared to go through with it at that time. Denies any recent fevers or illness.   Records reviewed. Per medical record review patient has a history of CHF, COPD. Left heart cath in 2016 with normal coronaries.  Past Medical History:  Diagnosis Date  . Abdominal hernia   . Arthritis   . Asthma   . Bladder dysfunction   . Carpal tunnel syndrome   . CHF (congestive heart failure) ([place])   . COPD (chronic obstructive pulmonary disease) ([place])   . DDD (degenerative disc disease), lumbar   . Diabetes mellitus without complication ([place])   . Dyspnea   . Edema   . Fatty tumor   . GERD (gastroesophageal reflux disease)   . H/O colonoscopy   . H/O tubal ligation    hx btl  . History of cholecystectomy    hx of cholecystectomy  . HLD (hyperlipidemia)   . Hypertension   . Muscle cramps   . Neuropathy   . Obesity   . Recurrent boils   . Schizoaffective disorder ([place])   . Sleep apnea   . Stroke ([place])   . UTI (lower urinary tract infection)   . Vaginitis     Patient Active Problem List   Diagnosis Date Noted  . Cellulitis of neck 06/20/2017  . AVM (arteriovenous malformation) of small  bowel, acquired with hemorrhage   . Goals of care, counseling/discussion 05/17/2017  . Iron deficiency anemia 05/10/2017  . B12 deficiency 05/10/2017  . GI bleed 03/06/2017  . Abdominal pain   . Symptomatic anemia   . Acute posthemorrhagic anemia   . Heme + stool   . Sepsis ([place]) 07/15/2016  . Septic shock ([place]) 10/28/2015  . COPD (chronic obstructive pulmonary disease) ([place]) 07/31/2015  . Aspirin overdose 07/30/2015  . Schizoaffective disorder, bipolar type ([place]) 12/05/2014  . HTN (hypertension) 12/05/2014  . Sleep apnea 12/05/2014  . Diabetes ([place]) 12/05/2014  . Incomplete bladder emptying 12/04/2014  . Recurrent UTI 12/04/2014  . Polyneuropathy 03/20/2009  . Arthritis, degenerative 05/23/2007  . HLD (hyperlipidemia) 05/23/2007    Past Surgical History:  Procedure Laterality Date  . CARDIAC CATHETERIZATION Left 09/24/2014   Procedure: Left Heart Cath and Coronary Angiography;  Surgeon: Dionisio David, MD;  Location: [place] [facility];  Service: Cardiovascular;  Laterality: Left;  . CHOLECYSTECTOMY    . COLONOSCOPY WITH PROPOFOL N/A 08/12/2015   Procedure: COLONOSCOPY WITH PROPOFOL;  Surgeon: Lollie Sails, MD;  Location: [facility] ENDOSCOPY;  Service: Endoscopy;  Laterality: N/A;  . COLONOSCOPY WITH PROPOFOL N/A 04/23/2017   Procedure: COLONOSCOPY WITH PROPOFOL;  Surgeon: Jonathon Bellows, MD;  Location: [facility] ENDOSCOPY;  Service: Endoscopy;  Laterality:  N/A;  . ENTEROSCOPY N/A 05/28/2017   Procedure: SMALL BOWEL ENTEROSCOPY;  Surgeon: Lin Landsman, MD;  Location: [facility] ENDOSCOPY;  Service: Gastroenterology;  Laterality: N/A;  . ENTEROSCOPY N/A 06/04/2017   Procedure: RETROGRADE SMALL BOWEL ENTEROSCOPY;  Surgeon: Lin Landsman, MD;  Location: [facility] ENDOSCOPY;  Service: Gastroenterology;  Laterality: N/A;  . ESOPHAGOGASTRODUODENOSCOPY (EGD) WITH PROPOFOL N/A 08/12/2015   Procedure: ESOPHAGOGASTRODUODENOSCOPY (EGD) WITH PROPOFOL;  Surgeon: Lollie Sails, MD;  Location: [facility]  ENDOSCOPY;  Service: Endoscopy;  Laterality: N/A;  . ESOPHAGOGASTRODUODENOSCOPY (EGD) WITH PROPOFOL N/A 03/06/2017   Procedure: ESOPHAGOGASTRODUODENOSCOPY (EGD) WITH PROPOFOL;  Surgeon: Lucilla Lame, MD;  Location: [facility] ENDOSCOPY;  Service: Endoscopy;  Laterality: N/A;  . GIVENS CAPSULE STUDY N/A 05/25/2017   Procedure: GIVENS CAPSULE STUDY;  Surgeon: Lucilla Lame, MD;  Location: [facility] ENDOSCOPY;  Service: Endoscopy;  Laterality: N/A;  . tubal ligation      Prior to Admission medications   Medication Sig Start Date End Date Taking? Authorizing Provider  acetaminophen (TYLENOL) 500 MG tablet Take 500 mg by mouth every 8 (eight) hours as needed for mild pain or moderate pain.    [provider]  albuterol (PROVENTIL HFA;VENTOLIN HFA) 108 (90 Base) MCG/ACT inhaler Inhale 2 puffs into the lungs every 6 (six) hours as needed for shortness of breath.    [provider]  aspirin EC 81 MG tablet Take 81 mg by mouth daily.    [provider]  atorvastatin (LIPITOR) 20 MG tablet Take 20 mg by mouth at bedtime.    [provider]  busPIRone (BUSPAR) 10 MG tablet Take 10 mg by mouth 2 (two) times daily.     [provider]  clonazePAM (KLONOPIN) 0.5 MG tablet Take 1 tablet (0.5 mg total) by mouth 2 (two) times daily. Patient taking differently: Take 0.5 mg by mouth daily.  07/18/16   Gouru, Illene Silver, MD  clonazePAM (KLONOPIN) 1 MG tablet Take 1 mg by mouth at bedtime.    [provider]  Cyanocobalamin (B-12) 1000 MCG CAPS Take 1,000 mcg by mouth daily. 05/10/17   Karen Kitchens, NP  diphenoxylate-atropine (LOMOTIL) 2.5-0.025 MG tablet Take 1 tablet by mouth daily as needed for diarrhea or loose stools.    [provider]  docusate sodium (COLACE) 100 MG capsule Take 100 mg by mouth 2 (two) times daily as needed for mild constipation.    [provider]  ferrous sulfate 325 (65 FE) MG tablet Take 325 mg by mouth 2 (two) times daily with a meal.     [provider]  Fluticasone-Salmeterol (ADVAIR) 250-50 MCG/DOSE AEPB Inhale 1 puff into the lungs 2 (two) times daily.    [provider]  furosemide (LASIX) 20 MG tablet Take 1 tablet (20 mg total) by mouth 2 (two) times daily. Patient taking differently: Take 20 mg by mouth daily.  07/18/16   Nicholes Mango, MD  haloperidol decanoate (HALDOL DECANOATE) 100 MG/ML injection Inject 1 mL (100 mg total) into the muscle every 30 (thirty) days. 08/02/15   Hildred Priest, MD  insulin aspart (NOVOLOG) 100 UNIT/ML injection Inject 0-10 Units into the skin 3 (three) times daily before meals.     [provider]  Insulin Pen Needle (FIFTY50 PEN NEEDLES) 31G X 8 MM MISC PEN NEEDLES 31G X 6 MM MISC 10/07/12   [provider]  loperamide (IMODIUM A-D) 2 MG tablet Take 2 mg by mouth 4 (four) times daily as needed for diarrhea or loose stools.  [provider]  Menthol (HONEY LEMON COUGH DROPS MT) Use as directed 1 lozenge in the mouth or throat 4 (four) times daily as needed (cough).    [provider]  metFORMIN (GLUCOPHAGE-XR) 500 MG 24 hr tablet Take 1,000 mg by mouth 2 (two) times daily.  05/23/15   [provider]  metoprolol tartrate (LOPRESSOR) 25 MG tablet Take 0.5 tablets (12.5 mg total) by mouth 2 (two) times daily. Patient taking differently: Take 50 mg by mouth 2 (two) times daily.  07/18/16   Nicholes Mango, MD  nitroGLYCERIN (NITROSTAT) 0.3 MG SL tablet Place 0.3 mg under the tongue every 5 (five) minutes as needed for chest pain.    [provider]  ondansetron (ZOFRAN) 4 MG tablet Take 4 mg by mouth every 6 (six) hours as needed for nausea or vomiting.    [provider]  pantoprazole (PROTONIX) 40 MG tablet Take 40 mg by mouth 2 (two) times daily.    [provider]  potassium chloride (K-DUR) 10 MEQ tablet Take 10 mEq by mouth daily.    [provider]  pregabalin (LYRICA) 100 MG capsule Take 100  mg by mouth 3 (three) times daily.    [provider]  venlafaxine XR (EFFEXOR-XR) 150 MG 24 hr capsule Take 150 mg by mouth daily with breakfast. Take with 75mg  capsule for a total of 225mg     [provider]  venlafaxine XR (EFFEXOR-XR) 75 MG 24 hr capsule Take 75 mg by mouth daily with breakfast. Take with 150mg  capsule for a total of 225mg     [provider]    Allergies Bee venom, Shellfish allergy, Shellfish-derived products, Codeine, Thorazine [chlorpromazine], Tylenol with codeine #3 [acetaminophen-codeine], and Metronidazole  Family History  Problem Relation Age of Onset  . CAD Mother   . Lung cancer Mother   . Mental illness Mother   . Mental illness Father 23       suicided   . Mental illness Sister   . CAD Sister   . Mental illness Brother   . CAD Sister     Social History Social History   Tobacco Use  . Smoking status: Current Some Day Smoker    Packs/day: 0.25    Years: 40.00    Pack years: 10.00    Types: Cigarettes  . Smokeless tobacco: Former Systems developer    Types: Secondary school teacher  . Vaping Use: Former  Substance Use Topics  . Alcohol use: No    Alcohol/week: 0.0 standard drinks  . Drug use: No    Review of Systems Constitutional: No fever/chills Eyes: No visual changes. ENT: No sore throat. Cardiovascular: Positive for chest pain. Respiratory: Denies shortness of breath. Gastrointestinal: No abdominal pain.  No nausea, no vomiting.  No diarrhea.   Genitourinary: Negative for dysuria. Musculoskeletal: Positive for right arm pain. Skin: Negative for rash. Neurological: Negative for headaches, focal weakness or numbness.  ____________________________________________   PHYSICAL EXAM:  VITAL SIGNS: ED Triage Vitals  Enc Vitals Group     BP 04/12/20 2015 (!) 149/87     Pulse Rate 04/12/20 2015 65     Resp 04/12/20 2015 (!) 24     Temp 04/12/20 2012 98.2 F (36.8 C)     Temp Source 04/12/20 2012 Oral     SpO2 04/12/20  2015 97 %     Weight 04/12/20 2013 204 lb (92.5 kg)     Height 04/12/20 2013 4\' 9"  (1.448 m)  Head Circumference --      Peak Flow --      Pain Score 04/12/20 2013 9   Constitutional: Alert and oriented.  Eyes: Conjunctivae are normal.  ENT      Head: Normocephalic and atraumatic.      Nose: No congestion/rhinnorhea.      Mouth/Throat: Mucous membranes are moist.      Neck: No stridor. Hematological/Lymphatic/Immunilogical: No cervical lymphadenopathy. Cardiovascular: Normal rate, regular rhythm.  No murmurs, rubs, or gallops.  Respiratory: Normal respiratory effort without tachypnea nor retractions. Breath sounds are clear and equal bilaterally. No wheezes/rales/rhonchi. Gastrointestinal: Soft and non tender. No rebound. No guarding.  Genitourinary: Deferred Musculoskeletal: Normal range of motion in all extremities. No lower extremity edema. Neurologic:  Normal speech and language. No gross focal neurologic deficits are appreciated.  Skin:  Skin is warm, dry and intact. No rash noted. Psychiatric: Mood and affect are normal. Speech and behavior are normal. Patient exhibits appropriate insight and judgment.  ____________________________________________    LABS (pertinent positives/negatives)  Trop hs 4 CBC wbc 8.8, hgb 12.3, plt 207 BMP wnl except glu 116  ____________________________________________   EKG  I, Nance Pear, attending physician, personally viewed and interpreted this EKG  EKG Time: 2015 Rate: 72 Rhythm: sinus rhythm Axis: normal Intervals: qtc 436 QRS: RBBB ST changes: no st elevation Impression: abnormal ekg  ____________________________________________    RADIOLOGY  CXR No acute abnormality  ____________________________________________   PROCEDURES  Procedures  ____________________________________________   INITIAL IMPRESSION / ASSESSMENT AND PLAN / ED COURSE  Pertinent labs & imaging results that were available during my  care of the patient were reviewed by me and considered in my medical decision making (see chart for details).   Patient presented to the emergency department today because of concern for chest pain. EKG without ST elevation. Initial troponin negative. CXR without pna/ptx. At this time I doubt PE or dissection. Given self reported history of coronary disease will check second troponin. Last cath was over 5 years ago, although that did not show any coronary disease. Do wonder if it was possibly some indigestion given it did start after eating.   2nd troponin was negative. Patient feels better. Will plan on discharging.   ____________________________________________   FINAL CLINICAL IMPRESSION(S) / ED DIAGNOSES  Final diagnoses:  Nonspecific chest pain     Note: This dictation was prepared with Dragon dictation. Any transcriptional errors that result from this process are unintentional     Nance Pear, MD 04/12/20 2257

## 2020-04-12 NOTE — ED Notes (Signed)
Attempted to call [facility], no answer at this time.

## 2020-04-12 NOTE — ED Triage Notes (Signed)
Pt arrives EMS from [facility] for c/o chest pain that radiates down the left arm. Pts facility gave her 2 sublingual Nitro with no relief in pain. EMs gave pt 324 mg of Aspirin and started a 20 g in the left AC. Pt is unsure whether she has previous MI.

## 2020-04-12 NOTE — Discharge Instructions (Signed)
Please seek medical attention for any high fevers, chest pain, shortness of breath, change in behavior, persistent vomiting, bloody stool or any other new or concerning symptoms.  

## 2020-04-13 NOTE — ED Notes (Signed)
PT states she wants to waiting in the lobby for her daughter to take her back to the facility. Pt states she doesn't want to wait in her room. PT is A&Ox4 and is able to ambulate steadly with walker. Discharge paper was given at this time.

## 2020-07-10 ENCOUNTER — Encounter: Payer: Self-pay | Admitting: Hematology and Oncology

## 2020-07-17 ENCOUNTER — Ambulatory Visit (INDEPENDENT_AMBULATORY_CARE_PROVIDER_SITE_OTHER): Payer: Medicare Other | Admitting: Urology

## 2020-07-17 ENCOUNTER — Encounter: Payer: Self-pay | Admitting: Urology

## 2020-07-17 ENCOUNTER — Other Ambulatory Visit: Payer: Self-pay

## 2020-07-17 VITALS — BP 117/82 | HR 69 | Ht <= 58 in | Wt 204.0 lb

## 2020-07-17 DIAGNOSIS — R1032 Left lower quadrant pain: Secondary | ICD-10-CM

## 2020-07-17 DIAGNOSIS — N3281 Overactive bladder: Secondary | ICD-10-CM | POA: Diagnosis not present

## 2020-07-17 DIAGNOSIS — N3941 Urge incontinence: Secondary | ICD-10-CM | POA: Diagnosis not present

## 2020-07-17 MED ORDER — OXYBUTYNIN CHLORIDE ER 5 MG PO TB24: 10.0000 mg | ORAL_TABLET | Freq: Every day | ORAL | 3 refills | Status: DC

## 2020-07-17 NOTE — Progress Notes (Signed)
07/17/2020 2:14 PM   Cristina [name] [birth year] [id number]  Referring provider: No referring provider defined for this encounter.  Chief Complaint  Patient presents with  . Other    HPI: Cristina [name] is a 60 y.o. female who presents for evaluation of an abnormal renal ultrasound.  She is accompanied by Etter Sjogren of [facility].  She has a history of intermittent left lower quadrant abdominal pain for ~ 6 months.  Severity typically mild to moderate.  No change in bowel habits.  No dysuria.  She does have chronic urinary frequency, urgency and urge incontinence.  A renal ultrasound was performed on 06/12/2020 which was felt to show fullness of the left collecting system "compatible with urinary backflow versus mild hydronephrosis".  This was a mobile scan performed at the facility and images were not available for review.  No prior history of stone disease.  No gross hematuria.   PMH: Past Medical History:  Diagnosis Date  . Abdominal hernia   . Arthritis   . Asthma   . Bladder dysfunction   . Carpal tunnel syndrome   . CHF (congestive heart failure) ([place])   . COPD (chronic obstructive pulmonary disease) ([place])   . DDD (degenerative disc disease), lumbar   . Diabetes mellitus without complication ([place])   . Dyspnea   . Edema   . Fatty tumor   . GERD (gastroesophageal reflux disease)   . H/O colonoscopy   . H/O tubal ligation    hx btl  . History of cholecystectomy    hx of cholecystectomy  . HLD (hyperlipidemia)   . Hypertension   . Muscle cramps   . Neuropathy   . Obesity   . Recurrent boils   . Schizoaffective disorder ([place])   . Sleep apnea   . Stroke ([place])   . UTI (lower urinary tract infection)   . Vaginitis     Surgical History: Past Surgical History:  Procedure Laterality Date  . CARDIAC CATHETERIZATION Left 09/24/2014   Procedure: Left Heart Cath and Coronary Angiography;  Surgeon: Dionisio David, MD;  Location: [place] [facility];   Service: Cardiovascular;  Laterality: Left;  . CHOLECYSTECTOMY    . COLONOSCOPY WITH PROPOFOL N/A 08/12/2015   Procedure: COLONOSCOPY WITH PROPOFOL;  Surgeon: Lollie Sails, MD;  Location: [facility] ENDOSCOPY;  Service: Endoscopy;  Laterality: N/A;  . COLONOSCOPY WITH PROPOFOL N/A 04/23/2017   Procedure: COLONOSCOPY WITH PROPOFOL;  Surgeon: Jonathon Bellows, MD;  Location: [facility] ENDOSCOPY;  Service: Endoscopy;  Laterality: N/A;  . ENTEROSCOPY N/A 05/28/2017   Procedure: SMALL BOWEL ENTEROSCOPY;  Surgeon: Lin Landsman, MD;  Location: [facility] ENDOSCOPY;  Service: Gastroenterology;  Laterality: N/A;  . ENTEROSCOPY N/A 06/04/2017   Procedure: RETROGRADE SMALL BOWEL ENTEROSCOPY;  Surgeon: Lin Landsman, MD;  Location: [facility] ENDOSCOPY;  Service: Gastroenterology;  Laterality: N/A;  . ESOPHAGOGASTRODUODENOSCOPY (EGD) WITH PROPOFOL N/A 08/12/2015   Procedure: ESOPHAGOGASTRODUODENOSCOPY (EGD) WITH PROPOFOL;  Surgeon: Lollie Sails, MD;  Location: [facility] ENDOSCOPY;  Service: Endoscopy;  Laterality: N/A;  . ESOPHAGOGASTRODUODENOSCOPY (EGD) WITH PROPOFOL N/A 03/06/2017   Procedure: ESOPHAGOGASTRODUODENOSCOPY (EGD) WITH PROPOFOL;  Surgeon: Lucilla Lame, MD;  Location: [facility] ENDOSCOPY;  Service: Endoscopy;  Laterality: N/A;  . GIVENS CAPSULE STUDY N/A 05/25/2017   Procedure: GIVENS CAPSULE STUDY;  Surgeon: Lucilla Lame, MD;  Location: [facility] ENDOSCOPY;  Service: Endoscopy;  Laterality: N/A;  . tubal ligation      Home Medications:  Allergies as of 07/17/2020      Reactions  Bee Venom Anaphylaxis, Shortness Of Breath   Stated by Patient   Shellfish Allergy Anaphylaxis, Shortness Of Breath   Stated by Patient Stated by Patient   Shellfish-derived Products Anaphylaxis, Shortness Of Breath   Stated by Patient   Codeine    Thorazine [chlorpromazine] Swelling   Tylenol With Codeine #3 [acetaminophen-codeine] Other (See Comments)   Hallucinations   Metronidazole Rash      Medication List       Accurate as  of July 17, 2020  2:14 PM. If you have any questions, ask your nurse or doctor.        acetaminophen 500 MG tablet Commonly known as: TYLENOL Take 500 mg by mouth every 8 (eight) hours as needed for mild pain or moderate pain.   albuterol 108 (90 Base) MCG/ACT inhaler Commonly known as: VENTOLIN HFA Inhale 2 puffs into the lungs every 6 (six) hours as needed for shortness of breath.   aspirin EC 81 MG tablet Take 81 mg by mouth daily.   atorvastatin 20 MG tablet Commonly known as: LIPITOR Take 20 mg by mouth at bedtime.   B-12 1000 MCG Caps Take 1,000 mcg by mouth daily.   busPIRone 10 MG tablet Commonly known as: BUSPAR Take 10 mg by mouth 2 (two) times daily.   clonazePAM 1 MG tablet Commonly known as: KLONOPIN Take 1 mg by mouth at bedtime. What changed: Another medication with the same name was changed. Make sure you understand how and when to take each.   clonazePAM 0.5 MG tablet Commonly known as: KLONOPIN Take 1 tablet (0.5 mg total) by mouth 2 (two) times daily. What changed: when to take this   diphenoxylate-atropine 2.5-0.025 MG tablet Commonly known as: LOMOTIL Take 1 tablet by mouth daily as needed for diarrhea or loose stools.   docusate sodium 100 MG capsule Commonly known as: COLACE Take 100 mg by mouth 2 (two) times daily as needed for mild constipation.   ferrous sulfate 325 (65 FE) MG tablet Take 325 mg by mouth 2 (two) times daily with a meal.   Fluticasone-Salmeterol 250-50 MCG/DOSE Aepb Commonly known as: ADVAIR Inhale 1 puff into the lungs 2 (two) times daily.   furosemide 20 MG tablet Commonly known as: LASIX Take 1 tablet (20 mg total) by mouth 2 (two) times daily. What changed: when to take this   haloperidol decanoate 100 MG/ML injection Commonly known as: HALDOL DECANOATE Inject 1 mL (100 mg total) into the muscle every 30 (thirty) days.   HONEY LEMON COUGH DROPS MT Use as directed 1 lozenge in the mouth or throat 4 (four) times  daily as needed (cough).   insulin aspart 100 UNIT/ML injection Commonly known as: novoLOG Inject 0-10 Units into the skin 3 (three) times daily before meals.   Insulin Pen Needle 31G X 8 MM Misc PEN NEEDLES 31G X 6 MM MISC   loperamide 2 MG tablet Commonly known as: IMODIUM A-D Take 2 mg by mouth 4 (four) times daily as needed for diarrhea or loose stools.   metFORMIN 500 MG 24 hr tablet Commonly known as: GLUCOPHAGE-XR Take 1,000 mg by mouth 2 (two) times daily.   metoprolol tartrate 25 MG tablet Commonly known as: LOPRESSOR Take 0.5 tablets (12.5 mg total) by mouth 2 (two) times daily. What changed: how much to take   nitroGLYCERIN 0.3 MG SL tablet Commonly known as: NITROSTAT Place 0.3 mg under the tongue every 5 (five) minutes as needed for chest pain.   ondansetron 4  MG tablet Commonly known as: ZOFRAN Take 4 mg by mouth every 6 (six) hours as needed for nausea or vomiting.   pantoprazole 40 MG tablet Commonly known as: PROTONIX Take 40 mg by mouth 2 (two) times daily.   potassium chloride 10 MEQ tablet Commonly known as: KLOR-CON Take 10 mEq by mouth daily.   pregabalin 100 MG capsule Commonly known as: LYRICA Take 100 mg by mouth 3 (three) times daily.   venlafaxine XR 75 MG 24 hr capsule Commonly known as: EFFEXOR-XR Take 75 mg by mouth daily with breakfast. Take with 150mg  capsule for a total of 225mg    venlafaxine XR 150 MG 24 hr capsule Commonly known as: EFFEXOR-XR Take 150 mg by mouth daily with breakfast. Take with 75mg  capsule for a total of 225mg        Allergies:  Allergies  Allergen Reactions  . Bee Venom Anaphylaxis and Shortness Of Breath    Stated by Patient  . Shellfish Allergy Anaphylaxis and Shortness Of Breath    Stated by Patient Stated by Patient   . Shellfish-Derived Products Anaphylaxis and Shortness Of Breath    Stated by Patient  . Codeine   . Thorazine [Chlorpromazine] Swelling  . Tylenol With Codeine #3  [Acetaminophen-Codeine] Other (See Comments)    Hallucinations  . Metronidazole Rash    Family History: Family History  Problem Relation Age of Onset  . CAD Mother   . Lung cancer Mother   . Mental illness Mother   . Mental illness Father 27       suicided   . Mental illness Sister   . CAD Sister   . Mental illness Brother   . CAD Sister     Social History:  reports that she has been smoking cigarettes. She has a 10.00 pack-year smoking history. She has quit using smokeless tobacco.  Her smokeless tobacco use included chew. She reports that she does not drink alcohol and does not use drugs.   Physical Exam: BP 117/82   Pulse 69   Ht 4\' 9"  (1.448 m)   Wt 204 lb (92.5 kg)   BMI 44.15 kg/m   Constitutional:  Alert, No acute distress. HEENT: [place] AT, moist mucus membranes.  Trachea midline, no masses. Cardiovascular: No clubbing, cyanosis, or edema. Respiratory: Normal respiratory effort, no increased work of breathing. Psychiatric: Normal mood and affect.   Assessment & Plan:    1.  Left lower quadrant abdominal pain, chronic  Renal ultrasound report interpreted as mild fullness of the left collecting system  Will schedule noncontrast CT abdomen pelvis for further evaluation  Will call with report  2.  Overactive bladder with urge incontinence  She states her symptoms are bothersome enough that she desires a trial of medication.  Rx extended release oxybutynin sent to [facility] [address], [place], [place] [address] [phone]

## 2020-08-06 ENCOUNTER — Other Ambulatory Visit: Payer: Self-pay

## 2020-08-06 ENCOUNTER — Ambulatory Visit
Admission: RE | Admit: 2020-08-06 | Discharge: 2020-08-06 | Disposition: A | Discharge status: Home / Self Care | Payer: Medicare Other | Source: Ambulatory Visit | Attending: Urology | Admitting: Urology

## 2020-08-06 DIAGNOSIS — R1032 Left lower quadrant pain: Secondary | ICD-10-CM | POA: Insufficient documentation

## 2020-08-09 ENCOUNTER — Telehealth: Payer: Self-pay | Admitting: *Deleted

## 2020-08-09 NOTE — Telephone Encounter (Signed)
-----   Message from Abbie Sons, MD sent at 08/08/2020  7:26 AM EDT ----- CT showed no hydronephrosis and no abnormalities.  No further evaluation is needed from a urologic standpoint.  No findings that would explain her abdominal pain and would recommend follow-up with her PCP

## 2020-08-09 NOTE — Telephone Encounter (Signed)
Notified patient care giver as instructed, patient pleased. Discussed follow-up appointments, patient agrees

## 2021-09-30 ENCOUNTER — Emergency Department: Payer: Medicare Other

## 2021-09-30 ENCOUNTER — Emergency Department
Admission: EM | Admit: 2021-09-30 | Discharge: 2021-09-30 | Disposition: A | Discharge status: Home / Self Care | Payer: Medicare Other | Attending: Emergency Medicine | Admitting: Emergency Medicine

## 2021-09-30 ENCOUNTER — Encounter: Payer: Self-pay | Admitting: Intensive Care

## 2021-09-30 ENCOUNTER — Other Ambulatory Visit: Payer: Self-pay

## 2021-09-30 DIAGNOSIS — J449 Chronic obstructive pulmonary disease, unspecified: Secondary | ICD-10-CM | POA: Insufficient documentation

## 2021-09-30 DIAGNOSIS — M542 Cervicalgia: Secondary | ICD-10-CM | POA: Diagnosis not present

## 2021-09-30 DIAGNOSIS — S51012A Laceration without foreign body of left elbow, initial encounter: Secondary | ICD-10-CM | POA: Diagnosis not present

## 2021-09-30 DIAGNOSIS — M25561 Pain in right knee: Secondary | ICD-10-CM | POA: Diagnosis not present

## 2021-09-30 DIAGNOSIS — W01198A Fall on same level from slipping, tripping and stumbling with subsequent striking against other object, initial encounter: Secondary | ICD-10-CM | POA: Diagnosis not present

## 2021-09-30 DIAGNOSIS — R8271 Bacteriuria: Secondary | ICD-10-CM | POA: Diagnosis not present

## 2021-09-30 DIAGNOSIS — S62115A Nondisplaced fracture of triquetrum [cuneiform] bone, left wrist, initial encounter for closed fracture: Secondary | ICD-10-CM | POA: Diagnosis not present

## 2021-09-30 DIAGNOSIS — S6992XA Unspecified injury of left wrist, hand and finger(s), initial encounter: Secondary | ICD-10-CM | POA: Diagnosis present

## 2021-09-30 DIAGNOSIS — Y92129 Unspecified place in nursing home as the place of occurrence of the external cause: Secondary | ICD-10-CM | POA: Insufficient documentation

## 2021-09-30 DIAGNOSIS — R519 Headache, unspecified: Secondary | ICD-10-CM | POA: Diagnosis not present

## 2021-09-30 DIAGNOSIS — Z23 Encounter for immunization: Secondary | ICD-10-CM | POA: Insufficient documentation

## 2021-09-30 DIAGNOSIS — W19XXXA Unspecified fall, initial encounter: Secondary | ICD-10-CM

## 2021-09-30 LAB — URINALYSIS, ROUTINE W REFLEX MICROSCOPIC
Bilirubin Urine: NEGATIVE
Glucose, UA: 50 mg/dL — AB
Hgb urine dipstick: NEGATIVE
Ketones, ur: NEGATIVE mg/dL
Nitrite: NEGATIVE
Protein, ur: NEGATIVE mg/dL
Specific Gravity, Urine: 1.001 — ABNORMAL LOW (ref 1.005–1.030)
pH: 6 (ref 5.0–8.0)

## 2021-09-30 LAB — CBC WITH DIFFERENTIAL/PLATELET
Abs Immature Granulocytes: 0.02 10*3/uL (ref 0.00–0.07)
Basophils Absolute: 0 10*3/uL (ref 0.0–0.1)
Basophils Relative: 0 %
Eosinophils Absolute: 0.1 10*3/uL (ref 0.0–0.5)
Eosinophils Relative: 1 %
HCT: 38.3 % (ref 36.0–46.0)
Hemoglobin: 12.2 g/dL (ref 12.0–15.0)
Immature Granulocytes: 0 %
Lymphocytes Relative: 12 %
Lymphs Abs: 0.9 10*3/uL (ref 0.7–4.0)
MCH: 26.8 pg (ref 26.0–34.0)
MCHC: 31.9 g/dL (ref 30.0–36.0)
MCV: 84.2 fL (ref 80.0–100.0)
Monocytes Absolute: 0.6 10*3/uL (ref 0.1–1.0)
Monocytes Relative: 7 %
Neutro Abs: 6.1 10*3/uL (ref 1.7–7.7)
Neutrophils Relative %: 80 %
Platelets: 194 10*3/uL (ref 150–400)
RBC: 4.55 MIL/uL (ref 3.87–5.11)
RDW: 14.9 % (ref 11.5–15.5)
WBC: 7.7 10*3/uL (ref 4.0–10.5)
nRBC: 0 % (ref 0.0–0.2)

## 2021-09-30 LAB — COMPREHENSIVE METABOLIC PANEL
ALT: 15 U/L (ref 0–44)
AST: 15 U/L (ref 15–41)
Albumin: 3.8 g/dL (ref 3.5–5.0)
Alkaline Phosphatase: 115 U/L (ref 38–126)
Anion gap: 6 (ref 5–15)
BUN: 12 mg/dL (ref 8–23)
CO2: 30 mmol/L (ref 22–32)
Calcium: 9.5 mg/dL (ref 8.9–10.3)
Chloride: 103 mmol/L (ref 98–111)
Creatinine, Ser: 0.6 mg/dL (ref 0.44–1.00)
GFR, Estimated: >60 mL/min (ref >60)
Glucose, Bld: 122 mg/dL — ABNORMAL HIGH (ref 70–99)
Potassium: 4 mmol/L (ref 3.5–5.1)
Sodium: 139 mmol/L (ref 135–145)
Total Bilirubin: 0.4 mg/dL (ref 0.3–1.2)
Total Protein: 6.9 g/dL (ref 6.5–8.1)

## 2021-09-30 MED ORDER — ACETAMINOPHEN 500 MG PO TABS
1000.0000 mg | ORAL_TABLET | Freq: Once | ORAL | Status: AC
  Administered 2021-09-30: 1000 mg via ORAL
  Filled 2021-09-30: qty 2

## 2021-09-30 MED ORDER — TETANUS-DIPHTH-ACELL PERTUSSIS 5-2.5-18.5 LF-MCG/0.5 IM SUSY
0.5000 mL | PREFILLED_SYRINGE | Freq: Once | INTRAMUSCULAR | Status: AC
  Administered 2021-09-30: 0.5 mL via INTRAMUSCULAR
  Filled 2021-09-30: qty 0.5

## 2021-09-30 NOTE — ED Notes (Signed)
Pt states she fell and hit head injured left wrist and elbow and knee. Skin tear noted to left elbow.

## 2021-09-30 NOTE — Discharge Instructions (Addendum)
Leave the splint on the wrist and call the orthopedic number to make a follow-up appointment and return to the ER if she develops worsening pain or any other concerns.  She can take Tylenol 1 g every 8 hours as need for pain  She should not be bending over as this can increase risk for falling.

## 2021-09-30 NOTE — ED Triage Notes (Addendum)
Patient arrived by [facility] from [facility] for unwitnessed fall. Found in floor this AM around 5am. EMS reports she was ambulating around room when they arrived. Staff sent patient to be checked out. EMS reports A&O x4 upon arrival  Patient reports she was trying to clean up a mess and fell. Ambulates with walker at baseline  Patient c/o left head pain. Denies LOC. Also reports left elbow, left wrist, and right knee pain.

## 2021-09-30 NOTE — ED Provider Notes (Signed)
[facility] Provider Note    Event Date/Time   First MD Initiated Contact with Patient 09/30/21 1140     (approximate)   History   fall  HPI  Cristina [name] is a 61 y.o. female who comes in from [facility] from [facility] for an unwitnessed fall.  Patient was found down on the ground around 5 AM when EMS got there she was already ambulating around the room but staff sent her in to be evaluated patient normally ambulates with a walker.  Patient reporting some left head pain but denies LOC and reports some left elbow left wrist and right knee pain.  Patient reports that she had urinated on herself when she was trying to mop it up when she slipped and she fell down and did hit her head.  She reports a headache and some mild neck pain. NO LOC.  She has a skin tear on her left elbow but denies any significant pain there for a little bit of pain in her wrist.  According to the daughter who is at bedside she actually does not really use a walker too much to get around.  She mostly ambulates on her own.   Physical Exam   Triage Vital Signs: ED Triage Vitals  Enc Vitals Group     BP 09/30/21 1141 (!) 146/78     Pulse Rate 09/30/21 1141 74     Resp 09/30/21 1141 17     Temp 09/30/21 1141 98.2 F (36.8 C)     Temp Source 09/30/21 1141 Oral     SpO2 09/30/21 1141 94 %     Weight 09/30/21 0948 211 lb (95.7 kg)     Height 09/30/21 0948 '4\' 9"'$  (1.448 m)     Head Circumference --      Peak Flow --      Pain Score 09/30/21 0947 10     Pain Loc --      Pain Edu? --      Excl. in [place]? --     Most recent vital signs: Vitals:   09/30/21 1141  BP: (!) 146/78  Pulse: 74  Resp: 17  Temp: 98.2 F (36.8 C)  SpO2: 94%     General: Awake, no distress.  CV:  Good peripheral perfusion.  Resp:  Normal effort.  Abd:  No distention.  Other:  Some mild pain noted at the trquental.  Good distal pulse.  Finger movements intact. No chest wall tenderness no abdominal  tenderness.  Small skin tear on the left elbow but has full range of motion.  Patient's been ambulatory   ED Results / Procedures / Treatments   Labs (all labs ordered are listed, but only abnormal results are displayed) Labs Reviewed  COMPREHENSIVE METABOLIC PANEL - Abnormal; Notable for the following components:      Result Value   Glucose, Bld 122 (*)    All other components within normal limits  URINALYSIS, ROUTINE W REFLEX MICROSCOPIC - Abnormal; Notable for the following components:   Color, Urine STRAW (*)    APPearance CLEAR (*)    Specific Gravity, Urine 1.001 (*)    Glucose, UA 50 (*)    Leukocytes,Ua TRACE (*)    Bacteria, UA MANY (*)    All other components within normal limits  CBC WITH DIFFERENTIAL/PLATELET       RADIOLOGY I have reviewed the xray personally and interpreted no evidence of fracture of the knee  IMPRESSION: 1. No definite  fracture or dislocation of the left wrist. Irregular appearance of the dorsal carpus seen on lateral view suggests triquetral avulsion fracture. Correlate for acute point tenderness.   2.  Carpus is otherwise intact and normally aligned.   3.  Soft tissue edema about the wrist.     PROCEDURES:  Critical Care performed: No  Procedures   MEDICATIONS ORDERED IN ED: Medications - No data to display   IMPRESSION / MDM / [place] / ED COURSE  I reviewed the triage vital signs and the nursing notes.   Patient's presentation is most consistent with acute presentation with potential threat to life or bodily function.   Differential includes intracranial hemorrhage, cervical fracture, fracture of the wrist, skin tear, knee fracture.  Cbc normal UA bacteria but no white count no UTI symptoms she does report peeing more frequently but she is on Lasix and attributes it to that.  We will send for urine culture and if it grows anything I can call her back and evaluate her if any symptoms started.= Cmp  normal  Xray knee negative X-ray of the wrist shows possible triquetral avulsion fracture.  Patient does have some mild tenderness noted in this area.  We discussed placing a splint on this and the difficulty with walking with a walker if we were to do this but according to family she hardly ever uses a walker.  We discussed we could do a CT but expected also missed some fractures and if she is able to wear a splint then that would probably be best until she follows up with orthopedics.  They prefer to try to put her in the splint and have her follow-up with orthopedics.  CT head and neck are negative  Oxygen levels were 94% but she reports that she has a history of COPD uses oxygen at nighttime and this is around her baseline.  Denies any new shortness of breath  Discussed with them about a formal splint versus putting a removable wrist splint on they preferred to do the removable wrist splint understanding need to leave it on until a follow-up with orthopedics.  They expressed understanding felt comfortable with discharge home     FINAL CLINICAL IMPRESSION(S) / ED DIAGNOSES   Final diagnoses:  Fall, initial encounter  Closed nondisplaced fracture of triquetrum of left wrist, initial encounter     Rx / DC Orders   ED Discharge Orders     None        Note:  This document was prepared using Dragon voice recognition software and may include unintentional dictation errors.   Vanessa Morley, MD 09/30/21 [phone]

## 2021-10-02 LAB — URINE CULTURE: Culture: >=100000 — AB

## 2022-03-04 ENCOUNTER — Ambulatory Visit: Payer: Medicare Other | Admitting: Registered Nurse

## 2022-03-04 ENCOUNTER — Encounter: Payer: Self-pay | Admitting: Internal Medicine

## 2022-03-04 ENCOUNTER — Encounter
Admission: RE | Disposition: A | Discharge status: Home / Self Care | Payer: Self-pay | Source: Home / Self Care | Attending: Internal Medicine

## 2022-03-04 ENCOUNTER — Ambulatory Visit
Admission: RE | Admit: 2022-03-04 | Discharge: 2022-03-04 | Disposition: A | Discharge status: Home / Self Care | Payer: Medicare Other | Attending: Internal Medicine | Admitting: Internal Medicine

## 2022-03-04 ENCOUNTER — Other Ambulatory Visit: Payer: Self-pay

## 2022-03-04 DIAGNOSIS — K3189 Other diseases of stomach and duodenum: Secondary | ICD-10-CM | POA: Diagnosis not present

## 2022-03-04 DIAGNOSIS — D125 Benign neoplasm of sigmoid colon: Secondary | ICD-10-CM | POA: Diagnosis not present

## 2022-03-04 DIAGNOSIS — R1314 Dysphagia, pharyngoesophageal phase: Secondary | ICD-10-CM | POA: Insufficient documentation

## 2022-03-04 DIAGNOSIS — R197 Diarrhea, unspecified: Secondary | ICD-10-CM | POA: Diagnosis present

## 2022-03-04 DIAGNOSIS — K640 First degree hemorrhoids: Secondary | ICD-10-CM | POA: Diagnosis not present

## 2022-03-04 DIAGNOSIS — K2970 Gastritis, unspecified, without bleeding: Secondary | ICD-10-CM | POA: Insufficient documentation

## 2022-03-04 DIAGNOSIS — K591 Functional diarrhea: Secondary | ICD-10-CM | POA: Insufficient documentation

## 2022-03-04 DIAGNOSIS — D123 Benign neoplasm of transverse colon: Secondary | ICD-10-CM | POA: Insufficient documentation

## 2022-03-04 DIAGNOSIS — K2100 Gastro-esophageal reflux disease with esophagitis, without bleeding: Secondary | ICD-10-CM | POA: Insufficient documentation

## 2022-03-04 HISTORY — PX: COLONOSCOPY WITH PROPOFOL: SHX5780

## 2022-03-04 HISTORY — PX: ESOPHAGOGASTRODUODENOSCOPY (EGD) WITH PROPOFOL: SHX5813

## 2022-03-04 LAB — GLUCOSE, CAPILLARY: Glucose-Capillary: 178 mg/dL — ABNORMAL HIGH (ref 70–99)

## 2022-03-04 SURGERY — COLONOSCOPY WITH PROPOFOL
Anesthesia: General

## 2022-03-04 MED ORDER — LIDOCAINE HCL (CARDIAC) PF 100 MG/5ML IV SOSY
PREFILLED_SYRINGE | INTRAVENOUS | Status: DC | PRN
  Administered 2022-03-04: 100 mg via INTRAVENOUS

## 2022-03-04 MED ORDER — GLYCOPYRROLATE 0.2 MG/ML IJ SOLN
INTRAMUSCULAR | Status: DC | PRN
  Administered 2022-03-04: .2 mg via INTRAVENOUS

## 2022-03-04 MED ORDER — GLYCOPYRROLATE 0.2 MG/ML IJ SOLN
INTRAMUSCULAR | Status: AC
  Filled 2022-03-04: qty 1

## 2022-03-04 MED ORDER — PROPOFOL 500 MG/50ML IV EMUL
INTRAVENOUS | Status: DC | PRN
  Administered 2022-03-04: 100 ug/kg/min via INTRAVENOUS

## 2022-03-04 MED ORDER — PROPOFOL 1000 MG/100ML IV EMUL
INTRAVENOUS | Status: AC
  Filled 2022-03-04: qty 100

## 2022-03-04 MED ORDER — LIDOCAINE HCL (PF) 2 % IJ SOLN
INTRAMUSCULAR | Status: AC
  Filled 2022-03-04: qty 5

## 2022-03-04 MED ORDER — PROPOFOL 10 MG/ML IV BOLUS
INTRAVENOUS | Status: DC | PRN
  Administered 2022-03-04 (×2): 50 mg via INTRAVENOUS
  Administered 2022-03-04: 30 mg via INTRAVENOUS

## 2022-03-04 MED ORDER — SODIUM CHLORIDE 0.9 % IV SOLN: INTRAVENOUS | Status: DC

## 2022-03-04 NOTE — H&P (Signed)
Outpatient short stay form Pre-procedure 03/04/2022 8:40 AM Cristina [name], M.D.  Primary Physician: Irene Limbo, NP  Reason for visit:  Dysphagia  History of present illness:  Patient with intermittent solid food dysphagia. No weight loss. Patient is edentulous.    Current Facility-Administered Medications:    0.9 %  sodium chloride infusion, , Intravenous, Continuous, [place], Benay Pike, MD, Last Rate: 20 mL/hr at 03/04/22 0800, New Bag at 03/04/22 0800  Medications Prior to Admission  Medication Sig Dispense Refill Last Dose   albuterol (PROVENTIL HFA;VENTOLIN HFA) 108 (90 Base) MCG/ACT inhaler Inhale 2 puffs into the lungs every 6 (six) hours as needed for shortness of breath.   03/03/2022   aspirin EC 81 MG tablet Take 81 mg by mouth daily.   03/03/2022   atorvastatin (LIPITOR) 20 MG tablet Take 20 mg by mouth at bedtime.   03/03/2022   busPIRone (BUSPAR) 10 MG tablet Take 10 mg by mouth 2 (two) times daily.    03/03/2022   clonazePAM (KLONOPIN) 1 MG tablet Take 1 mg by mouth at bedtime.   03/03/2022   Cyanocobalamin (B-12) 1000 MCG CAPS Take 1,000 mcg by mouth daily. 90 capsule 0 03/03/2022   docusate sodium (COLACE) 100 MG capsule Take 100 mg by mouth 2 (two) times daily as needed for mild constipation.   03/03/2022   ferrous sulfate 325 (65 FE) MG tablet Take 325 mg by mouth 2 (two) times daily with a meal.   03/03/2022   Fluticasone-Salmeterol (ADVAIR) 250-50 MCG/DOSE AEPB Inhale 1 puff into the lungs 2 (two) times daily.   03/03/2022   furosemide (LASIX) 20 MG tablet Take 1 tablet (20 mg total) by mouth 2 (two) times daily. (Patient taking differently: Take 20 mg by mouth daily.) 60 tablet 0 03/03/2022   haloperidol decanoate (HALDOL DECANOATE) 100 MG/ML injection Inject 1 mL (100 mg total) into the muscle every 30 (thirty) days. 1 mL 0 03/03/2022   insulin aspart (NOVOLOG) 100 UNIT/ML injection Inject 0-10 Units into the skin 3 (three) times daily before meals.    03/03/2022    loperamide (IMODIUM A-D) 2 MG tablet Take 2 mg by mouth 4 (four) times daily as needed for diarrhea or loose stools.   03/03/2022   Menthol (HONEY LEMON COUGH DROPS MT) Use as directed 1 lozenge in the mouth or throat 4 (four) times daily as needed (cough).   03/03/2022   metFORMIN (GLUCOPHAGE-XR) 500 MG 24 hr tablet Take 1,000 mg by mouth 2 (two) times daily.    03/03/2022   metoprolol tartrate (LOPRESSOR) 25 MG tablet Take 0.5 tablets (12.5 mg total) by mouth 2 (two) times daily. (Patient taking differently: Take 50 mg by mouth 2 (two) times daily.)   03/03/2022   oxybutynin (DITROPAN-XL) 5 MG 24 hr tablet Take 2 tablets (10 mg total) by mouth daily. 30 tablet 3 03/03/2022   potassium chloride (K-DUR) 10 MEQ tablet Take 10 mEq by mouth daily.   03/03/2022   pregabalin (LYRICA) 100 MG capsule Take 100 mg by mouth 3 (three) times daily.   03/03/2022   venlafaxine XR (EFFEXOR-XR) 150 MG 24 hr capsule Take 150 mg by mouth daily with breakfast. Take with '75mg'$  capsule for a total of '225mg'$    03/03/2022   venlafaxine XR (EFFEXOR-XR) 75 MG 24 hr capsule Take 75 mg by mouth daily with breakfast. Take with '150mg'$  capsule for a total of '225mg'$    03/03/2022   acetaminophen (TYLENOL) 500 MG tablet Take 500 mg by mouth every  8 (eight) hours as needed for mild pain or moderate pain.      clonazePAM (KLONOPIN) 0.5 MG tablet Take 1 tablet (0.5 mg total) by mouth 2 (two) times daily. (Patient taking differently: Take 0.5 mg by mouth daily.) 15 tablet 0    diphenoxylate-atropine (LOMOTIL) 2.5-0.025 MG tablet Take 1 tablet by mouth daily as needed for diarrhea or loose stools.      Insulin Pen Needle 31G X 8 MM MISC PEN NEEDLES 31G X 6 MM MISC      nitroGLYCERIN (NITROSTAT) 0.3 MG SL tablet Place 0.3 mg under the tongue every 5 (five) minutes as needed for chest pain.      ondansetron (ZOFRAN) 4 MG tablet Take 4 mg by mouth every 6 (six) hours as needed for nausea or vomiting.      pantoprazole (PROTONIX) 40 MG tablet Take 40 mg  by mouth 2 (two) times daily.        Allergies  Allergen Reactions   Bee Venom Anaphylaxis and Shortness Of Breath    Stated by Patient   Shellfish Allergy Anaphylaxis and Shortness Of Breath    Stated by Patient Stated by Patient    Shellfish-Derived Products Anaphylaxis and Shortness Of Breath    Stated by Patient   Codeine    Thorazine [Chlorpromazine] Swelling   Tylenol With Codeine #3 [Acetaminophen-Codeine] Other (See Comments)    Hallucinations   Metronidazole Rash     Past Medical History:  Diagnosis Date   Abdominal hernia    Arthritis    Asthma    Bladder dysfunction    Carpal tunnel syndrome    CHF (congestive heart failure) (HCC)    COPD (chronic obstructive pulmonary disease) (HCC)    DDD (degenerative disc disease), lumbar    Diabetes mellitus without complication (HCC)    Dyspnea    Edema    Fatty tumor    GERD (gastroesophageal reflux disease)    H/O colonoscopy    H/O tubal ligation    hx btl   History of cholecystectomy    hx of cholecystectomy   HLD (hyperlipidemia)    Hypertension    Muscle cramps    Neuropathy    Obesity    Recurrent boils    Schizoaffective disorder (HCC)    Sleep apnea    Stroke ([place])    UTI (lower urinary tract infection)    Vaginitis     Review of systems:  Otherwise negative.    Physical Exam  Gen: Alert, oriented. Appears stated age.  HEENT: [place]/AT. PERRLA. Lungs: CTA, no wheezes. CV: RR nl S1, S2. Abd: soft, benign, no masses. BS+ Ext: No edema. Pulses 2+    Planned procedures: Proceed with EGD. The patient understands the nature of the planned procedure, indications, risks, alternatives and potential complications including but not limited to bleeding, infection, perforation, damage to internal organs and possible oversedation/side effects from anesthesia. The patient agrees and gives consent to proceed.  Please refer to procedure notes for findings, recommendations and patient disposition/instructions.      Petra Dumler K. Alice [name], M.D. Gastroenterology 03/04/2022  8:40 AM

## 2022-03-04 NOTE — Anesthesia Preprocedure Evaluation (Addendum)
Anesthesia Evaluation  Patient identified by MRN, date of birth, ID band Patient awake    Reviewed: Allergy & Precautions, H&P , NPO status , Patient's Chart, lab work & pertinent test results, reviewed documented beta blocker date and time   Airway Mallampati: II   Neck ROM: full    Dental  (+) Poor Dentition   Pulmonary shortness of breath and with exertion, asthma , sleep apnea , COPD,  COPD inhaler, Current Smoker   Pulmonary exam normal        Cardiovascular Exercise Tolerance: Poor hypertension, On Medications +CHF  Normal cardiovascular exam Rhythm:regular Rate:Normal     Neuro/Psych  PSYCHIATRIC DISORDERS   Bipolar Disorder Schizophrenia  Very poor historian. ja  Neuromuscular disease CVA    GI/Hepatic Neg liver ROS,GERD  ,,  Endo/Other  diabetes    Renal/GU negative Renal ROS  negative genitourinary   Musculoskeletal  (+) Arthritis ,    Abdominal   Peds  Hematology  (+) Blood dyscrasia, anemia   Anesthesia Other Findings Past Medical History: No date: Abdominal hernia No date: Arthritis No date: Asthma No date: Bladder dysfunction No date: Carpal tunnel syndrome No date: CHF (congestive heart failure) (HCC) No date: COPD (chronic obstructive pulmonary disease) (HCC) No date: DDD (degenerative disc disease), lumbar No date: Diabetes mellitus without complication (HCC) No date: Dyspnea No date: Edema No date: Fatty tumor No date: GERD (gastroesophageal reflux disease) No date: H/O colonoscopy No date: H/O tubal ligation     Comment:  hx btl No date: History of cholecystectomy     Comment:  hx of cholecystectomy No date: HLD (hyperlipidemia) No date: Hypertension No date: Muscle cramps No date: Neuropathy No date: Obesity No date: Recurrent boils No date: Schizoaffective disorder (HCC) No date: Sleep apnea No date: Stroke [facility]) No date: UTI (lower urinary tract infection) No date:  Vaginitis Past Surgical History: 09/24/2014: CARDIAC CATHETERIZATION; Left     Comment:  Procedure: Left Heart Cath and Coronary Angiography;                Surgeon: Dionisio David, MD;  Location: [place] [facility]               LAB;  Service: Cardiovascular;  Laterality: Left; No date: CHOLECYSTECTOMY 08/12/2015: COLONOSCOPY WITH PROPOFOL; N/A     Comment:  Procedure: COLONOSCOPY WITH PROPOFOL;  Surgeon: Lollie Sails, MD;  Location: [facility] ENDOSCOPY;  Service:               Endoscopy;  Laterality: N/A; 04/23/2017: COLONOSCOPY WITH PROPOFOL; N/A     Comment:  Procedure: COLONOSCOPY WITH PROPOFOL;  Surgeon: Jonathon Bellows, MD;  Location: [facility] ENDOSCOPY;  Service:               Endoscopy;  Laterality: N/A; 05/28/2017: ENTEROSCOPY; N/A     Comment:  Procedure: SMALL BOWEL ENTEROSCOPY;  Surgeon: Lin Landsman, MD;  Location: [facility] ENDOSCOPY;  Service:               Gastroenterology;  Laterality: N/A; 06/04/2017: ENTEROSCOPY; N/A     Comment:  Procedure: RETROGRADE SMALL BOWEL ENTEROSCOPY;  Surgeon:              Lin Landsman, MD;  Location: [place] ENDOSCOPY;  Service: Gastroenterology;  Laterality: N/A; 08/12/2015: ESOPHAGOGASTRODUODENOSCOPY (EGD) WITH PROPOFOL; N/A     Comment:  Procedure: ESOPHAGOGASTRODUODENOSCOPY (EGD) WITH               PROPOFOL;  Surgeon: Lollie Sails, MD;  Location:               [facility] ENDOSCOPY;  Service: Endoscopy;  Laterality: N/A; 03/06/2017: ESOPHAGOGASTRODUODENOSCOPY (EGD) WITH PROPOFOL; N/A     Comment:  Procedure: ESOPHAGOGASTRODUODENOSCOPY (EGD) WITH               PROPOFOL;  Surgeon: Lucilla Lame, MD;  Location: [facility]               ENDOSCOPY;  Service: Endoscopy;  Laterality: N/A; 05/25/2017: GIVENS CAPSULE STUDY; N/A     Comment:  Procedure: GIVENS CAPSULE STUDY;  Surgeon: Lucilla Lame,              MD;  Location: [facility] ENDOSCOPY;  Service: Endoscopy;                Laterality: N/A; No date: tubal  ligation   Reproductive/Obstetrics negative OB ROS                             Anesthesia Physical Anesthesia Plan  ASA: 3  Anesthesia Plan: General   Post-op Pain Management:    Induction:   PONV Risk Score and Plan:   Airway Management Planned:   Additional Equipment:   Intra-op Plan:   Post-operative Plan:   Informed Consent: I have reviewed the patients History and Physical, chart, labs and discussed the procedure including the risks, benefits and alternatives for the proposed anesthesia with the patient or authorized representative who has indicated his/her understanding and acceptance.     Dental Advisory Given  Plan Discussed with: CRNA  Anesthesia Plan Comments:        Anesthesia Quick Evaluation

## 2022-03-04 NOTE — H&P (Signed)
Outpatient short stay form Pre-procedure 03/04/2022 8:31 AM Americus Scheurich K. Alice Reichert, M.D.  Primary Physician: Irene Limbo, NP  Reason for visit:  Chronic diarrhea, iron deficiency anemia  History of present illness:  As above. From September 2023:  "Endoscopic History: Enteroscopy- retrograde 06/04/2017- Dr Marius Ditch- this also included the colon which appeared normal. There were no further avms in the examined small intestine (reached 70cm proximal to ICV) Enteroscopy- antegrade 05/28/2017- Dr Marius Ditch- 2 small avms treated with APC in the duodenum and a single avm in the mid jejunum also treated with APC Colonsocopy 3/19- Dr Vicente Males- several adenomas, 3y repeat recommended EGD 2019- Dr Allen Norris- normal esophagus/stomach/duodenum Colonoscopy 6/17- Dr Gustavo Lah- one adenoma and some hyperplastic polyps, diverticulosis. Random biopsies unremarakble. EGD 6/17- Dr Gustavo Lah- La grade B esophagitis, esophageal candida, gastritis, duodenitis. Candida was treated with clotrimazole. There was no H pylori, Barretts, dysplasia/malignancy/celiac sprue  Colonoscopy 2009 with hyperplastic polyps   CT A/P 6/22 without contrast IMPRESSION:  1. Left extrarenal pelvis. No hydronephrosis.  2. No acute abnormality in the abdomen or pelvis.  3. Aortic atherosclerosis.   INTERVAL HISTORY:  Ms. Rahrig was last seen on 06/20/15 for abdominal pain/rectal bleeding/IDA- endoscopies done as above, patient has not followed up until now in this clinic. 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  Current Facility-Administered Medications:    0.9 %  sodium chloride infusion, , Intravenous, Continuous, [place], Benay Pike, MD, Last Rate: 20 mL/hr at 03/04/22 0800, New Bag at 03/04/22 0800  Medications Prior to Admission  Medication Sig Dispense Refill Last Dose   albuterol (PROVENTIL HFA;VENTOLIN HFA) 108 (90 Base) MCG/ACT inhaler Inhale 2 puffs into the lungs  every 6 (six) hours as needed for shortness of breath.   03/03/2022   aspirin EC 81 MG tablet Take 81 mg by mouth daily.   03/03/2022   atorvastatin (LIPITOR) 20 MG tablet Take 20 mg by mouth at bedtime.   03/03/2022   busPIRone (BUSPAR) 10 MG tablet Take 10 mg by mouth 2 (two) times daily.    03/03/2022   clonazePAM (KLONOPIN) 1 MG tablet Take 1 mg by mouth at bedtime.   03/03/2022   Cyanocobalamin (B-12) 1000 MCG CAPS Take 1,000 mcg by mouth daily. 90 capsule 0 03/03/2022   docusate sodium (COLACE) 100 MG capsule Take 100 mg by mouth 2 (two) times daily as needed for mild constipation.   03/03/2022   ferrous sulfate 325 (65 FE) MG tablet Take 325 mg by mouth 2 (two) times daily with a meal.   03/03/2022   Fluticasone-Salmeterol (ADVAIR) 250-50 MCG/DOSE AEPB Inhale 1 puff into the lungs 2 (two) times daily.   03/03/2022   furosemide (LASIX) 20 MG tablet Take 1 tablet (20 mg total) by mouth 2 (two) times daily. (Patient taking differently: Take 20 mg by mouth daily.) 60 tablet 0 03/03/2022   haloperidol decanoate (HALDOL DECANOATE) 100 MG/ML injection Inject 1 mL (100 mg total) into the muscle every 30 (thirty) days. 1 mL 0 03/03/2022   insulin aspart (NOVOLOG) 100 UNIT/ML injection Inject 0-10 Units into the skin 3 (three) times daily before meals.    03/03/2022   loperamide (IMODIUM A-D) 2 MG tablet Take 2 mg by mouth 4 (four) times daily as needed for diarrhea or loose stools.   03/03/2022   Menthol (HONEY LEMON COUGH DROPS MT) Use as directed 1 lozenge in the mouth or throat 4 (four) times daily as needed (cough).   03/03/2022  metFORMIN (GLUCOPHAGE-XR) 500 MG 24 hr tablet Take 1,000 mg by mouth 2 (two) times daily.    03/03/2022   metoprolol tartrate (LOPRESSOR) 25 MG tablet Take 0.5 tablets (12.5 mg total) by mouth 2 (two) times daily. (Patient taking differently: Take 50 mg by mouth 2 (two) times daily.)   03/03/2022   oxybutynin (DITROPAN-XL) 5 MG 24 hr tablet Take 2 tablets (10 mg total) by mouth daily. 30  tablet 3 03/03/2022   potassium chloride (K-DUR) 10 MEQ tablet Take 10 mEq by mouth daily.   03/03/2022   pregabalin (LYRICA) 100 MG capsule Take 100 mg by mouth 3 (three) times daily.   03/03/2022   venlafaxine XR (EFFEXOR-XR) 150 MG 24 hr capsule Take 150 mg by mouth daily with breakfast. Take with '75mg'$  capsule for a total of '225mg'$    03/03/2022   venlafaxine XR (EFFEXOR-XR) 75 MG 24 hr capsule Take 75 mg by mouth daily with breakfast. Take with '150mg'$  capsule for a total of '225mg'$    03/03/2022   acetaminophen (TYLENOL) 500 MG tablet Take 500 mg by mouth every 8 (eight) hours as needed for mild pain or moderate pain.      clonazePAM (KLONOPIN) 0.5 MG tablet Take 1 tablet (0.5 mg total) by mouth 2 (two) times daily. (Patient taking differently: Take 0.5 mg by mouth daily.) 15 tablet 0    diphenoxylate-atropine (LOMOTIL) 2.5-0.025 MG tablet Take 1 tablet by mouth daily as needed for diarrhea or loose stools.      Insulin Pen Needle 31G X 8 MM MISC PEN NEEDLES 31G X 6 MM MISC      nitroGLYCERIN (NITROSTAT) 0.3 MG SL tablet Place 0.3 mg under the tongue every 5 (five) minutes as needed for chest pain.      ondansetron (ZOFRAN) 4 MG tablet Take 4 mg by mouth every 6 (six) hours as needed for nausea or vomiting.      pantoprazole (PROTONIX) 40 MG tablet Take 40 mg by mouth 2 (two) times daily.        Allergies  Allergen Reactions   Bee Venom Anaphylaxis and Shortness Of Breath    Stated by Patient   Shellfish Allergy Anaphylaxis and Shortness Of Breath    Stated by Patient Stated by Patient    Shellfish-Derived Products Anaphylaxis and Shortness Of Breath    Stated by Patient   Codeine    Thorazine [Chlorpromazine] Swelling   Tylenol With Codeine #3 [Acetaminophen-Codeine] Other (See Comments)    Hallucinations   Metronidazole Rash     Past Medical History:  Diagnosis Date   Abdominal hernia    Arthritis    Asthma    Bladder dysfunction    Carpal tunnel syndrome    CHF (congestive heart  failure) (HCC)    COPD (chronic obstructive pulmonary disease) (HCC)    DDD (degenerative disc disease), lumbar    Diabetes mellitus without complication (HCC)    Dyspnea    Edema    Fatty tumor    GERD (gastroesophageal reflux disease)    H/O colonoscopy    H/O tubal ligation    hx btl   History of cholecystectomy    hx of cholecystectomy   HLD (hyperlipidemia)    Hypertension    Muscle cramps    Neuropathy    Obesity    Recurrent boils    Schizoaffective disorder (HCC)    Sleep apnea    Stroke ([place])    UTI (lower urinary tract infection)    Vaginitis  Review of systems:  Otherwise negative.    Physical Exam  Gen: Alert, oriented. Appears stated age.  HEENT: [place]/AT. PERRLA. Lungs: CTA, no wheezes. CV: RR nl S1, S2. Abd: soft, benign, no masses. BS+ Ext: No edema. Pulses 2+    Planned procedures: Proceed with colonoscopy. The patient understands the nature of the planned procedure, indications, risks, alternatives and potential complications including but not limited to bleeding, infection, perforation, damage to internal organs and possible oversedation/side effects from anesthesia. The patient agrees and gives consent to proceed.  Please refer to procedure notes for findings, recommendations and patient disposition/instructions.     Alayzia Pavlock K. Alice Reichert, M.D. Gastroenterology 03/04/2022  8:31 AM

## 2022-03-04 NOTE — Op Note (Signed)
[facility] Patient Name: Cristina [name] Procedure Date: 03/04/2022 8:13 AM MRN: [id number] Account #: [id number] Date of Birth: Nov 26, 1960 Admit Type: Outpatient Age: 62 Room: [facility] ENDO ROOM 2 Gender: Female Note Status: Finalized Instrument Name: Upper Endoscope [id number] Procedure:             Upper GI endoscopy Indications:           Dysphagia, Suspected esophageal reflux, Diarrhea Providers:             Benay Pike. Aydin Hink MD, MD Medicines:             Propofol per Anesthesia Complications:         No immediate complications. Estimated blood loss:                         Minimal. Procedure:             Pre-Anesthesia Assessment:                        - The risks and benefits of the procedure and the                         sedation options and risks were discussed with the                         patient. All questions were answered and informed                         consent was obtained.                        After obtaining informed consent, the endoscope was                         passed under direct vision. Throughout the procedure,                         the patient's blood pressure, pulse, and oxygen                         saturations were monitored continuously. The Endoscope                         was introduced through the mouth, and advanced to the                         third part of duodenum. The upper GI endoscopy was                         accomplished without difficulty. The patient tolerated                         the procedure well. Findings:      No endoscopic abnormality was evident in the esophagus to explain the       patient's complaint of dysphagia. It was decided, however, to proceed       with dilation in the distal esophagus. The scope was withdrawn. Dilation       was performed with a Maloney dilator with no resistance at 43 Fr.  Patchy mild inflammation characterized by congestion (edema) and        erythema was found in the gastric antrum.      The cardia and gastric fundus were normal on retroflexion.      A single 10 mm mucosal nodule with a localized distribution was found in       the duodenal bulb. Biopsies were taken with a cold forceps for       histology. Biopsies were taken with a cold forceps for histology.      The exam was otherwise without abnormality.      Biopsies for histology were taken with a cold forceps in the first       portion of the duodenum and in the third portion of the duodenum for       evaluation of celiac disease. Impression:            - No endoscopic esophageal abnormality to explain                         patient's dysphagia. Esophagus dilated. Dilated.                        - Gastritis.                        - Mucosal nodule found in the duodenum. Biopsied.                        - The examination was otherwise normal.                        - Biopsies were taken with a cold forceps for                         evaluation of celiac disease. Recommendation:        - Await pathology results.                        - Proceed with colonoscopy Procedure Code(s):     --- Professional ---                        [phone], Esophagogastroduodenoscopy, flexible,                         transoral; with biopsy, single or multiple                        43450, Dilation of esophagus, by unguided sound or                         bougie, single or multiple passes Diagnosis Code(s):     --- Professional ---                        R19.7, Diarrhea, unspecified                        K31.89, Other diseases of stomach and duodenum                        K29.70, Gastritis, unspecified, without bleeding  R13.10, Dysphagia, unspecified CPT copyright 2022 American Medical Association. All rights reserved. The codes documented in this report are preliminary and upon coder review may  be revised to meet current compliance requirements. Efrain Sella  MD, MD 03/04/2022 8:58:07 AM This report has been signed electronically. Number of Addenda: 0 Note Initiated On: 03/04/2022 8:13 AM Estimated Blood Loss:  Estimated blood loss was minimal.      [facility]

## 2022-03-04 NOTE — Interval H&P Note (Signed)
History and Physical Interval Note:  03/04/2022 8:33 AM  Cristina [name]  has presented today for surgery, with the diagnosis of CHRONIC DIARRHEA ,GERD,PHARYNGOESOPHAGEAL DYSPHAGIA.  The various methods of treatment have been discussed with the patient and family. After consideration of risks, benefits and other options for treatment, the patient has consented to  Procedure(s): COLONOSCOPY WITH PROPOFOL (N/A) ESOPHAGOGASTRODUODENOSCOPY (EGD) WITH PROPOFOL (N/A) as a surgical intervention.  The patient's history has been reviewed, patient examined, no change in status, stable for surgery.  I have reviewed the patient's chart and labs.  Questions were answered to the patient's satisfaction.     [place], [place]

## 2022-03-04 NOTE — Op Note (Signed)
[facility] Patient Name: Cristina [name] Procedure Date: 03/04/2022 8:12 AM MRN: [id number] Account #: [id number] Date of Birth: 04-02-1960 Admit Type: Outpatient Age: 62 Room: [facility] ENDO ROOM 2 Gender: Female Note Status: Finalized Instrument Name: Jasper Riling [id number] Procedure:             Colonoscopy Indications:           Diarrhea of presumed infectious origin, Functional                         diarrhea Providers:             Benay Pike. Topacio Cella MD, MD Medicines:             Propofol per Anesthesia Complications:         No immediate complications. Procedure:             Pre-Anesthesia Assessment:                        - The risks and benefits of the procedure and the                         sedation options and risks were discussed with the                         patient. All questions were answered and informed                         consent was obtained.                        - Patient identification and proposed procedure were                         verified prior to the procedure by the nurse. The                         procedure was verified in the procedure room.                        - ASA Grade Assessment: III - A patient with severe                         systemic disease.                        - After reviewing the risks and benefits, the patient                         was deemed in satisfactory condition to undergo the                         procedure.                        - The risks and benefits of the procedure and the                         sedation options and risks were discussed with the  patient. All questions were answered and informed                         consent was obtained.                        - Patient identification and proposed procedure were                         verified prior to the procedure by the nurse. The                         procedure was verified in the  procedure room.                        - ASA Grade Assessment: III - A patient with severe                         systemic disease.                        - After reviewing the risks and benefits, the patient                         was deemed in satisfactory condition to undergo the                         procedure.                        After obtaining informed consent, the colonoscope was                         passed under direct vision. Throughout the procedure,                         the patient's blood pressure, pulse, and oxygen                         saturations were monitored continuously. The                         Colonoscope was introduced through the anus and                         advanced to the the cecum, identified by appendiceal                         orifice and ileocecal valve. The colonoscopy was                         performed without difficulty. The patient tolerated                         the procedure well. The quality of the bowel                         preparation was adequate. The ileocecal valve,  appendiceal orifice, and rectum were photographed. Findings:      The perianal and digital rectal examinations were normal. Pertinent       negatives include normal sphincter tone and no palpable rectal lesions.      Non-bleeding internal hemorrhoids were found during retroflexion. The       hemorrhoids were Grade I (internal hemorrhoids that do not prolapse).      Two sessile polyps were found in the sigmoid colon. The polyps were 4 to       5 mm in size. These polyps were removed with a jumbo cold forceps.       Resection and retrieval were complete.      Three sessile polyps were found in the transverse colon. The polyps were       6 to 9 mm in size. These polyps were removed with a hot snare. Resection       and retrieval were complete.      The exam was otherwise without abnormality.      Biopsies for histology were taken  with a cold forceps from the random       colon for evaluation of microscopic colitis. Impression:            - Non-bleeding internal hemorrhoids.                        - Two 4 to 5 mm polyps in the sigmoid colon, removed                         with a jumbo cold forceps. Resected and retrieved.                        - Three 6 to 9 mm polyps in the transverse colon,                         removed with a hot snare. Resected and retrieved.                        - The examination was otherwise normal.                        - Biopsies were taken with a cold forceps from the                         random colon for evaluation of microscopic colitis. Recommendation:        - Await pathology results from EGD, also performed                         today.                        - Patient has a contact number available for                         emergencies. The signs and symptoms of potential                         delayed complications were discussed with the patient.  Return to normal activities tomorrow. Written                         discharge instructions were provided to the patient.                        - Resume previous diet.                        - Continue present medications.                        - Repeat colonoscopy is recommended for surveillance.                         The colonoscopy date will be determined after                         pathology results from today's exam become available                         for review.                        - Return to GI office in 3 months.                        - Follow up with Stephens November, GI Nurse                         Practioner, in office to discuss results and monitor                         progress.                        - Telephone GI office to schedule appointment.                        - The findings and recommendations were discussed with                         the  patient. Procedure Code(s):     --- Professional ---                        [phone], Colonoscopy, flexible; with removal of                         tumor(s), polyp(s), or other lesion(s) by snare                         technique                        45380, 44, Colonoscopy, flexible; with biopsy, single                         or multiple Diagnosis Code(s):     --- Professional ---                        K59.1, Functional  diarrhea                        R19.7, Diarrhea, unspecified                        K64.0, First degree hemorrhoids                        D12.3, Benign neoplasm of transverse colon (hepatic                         flexure or splenic flexure)                        D12.5, Benign neoplasm of sigmoid colon CPT copyright 2022 American Medical Association. All rights reserved. The codes documented in this report are preliminary and upon coder review may  be revised to meet current compliance requirements. Attending Participation:      I personally performed the entire procedure. Efrain Sella MD, MD 03/04/2022 9:32:25 AM This report has been signed electronically. Number of Addenda: 0 Note Initiated On: 03/04/2022 8:12 AM Scope Withdrawal Time: 0 hours 9 minutes 4 seconds  Total Procedure Duration: 0 hours 19 minutes 29 seconds  Estimated Blood Loss:  Estimated blood loss: none.      [facility]

## 2022-03-04 NOTE — Anesthesia Postprocedure Evaluation (Signed)
Anesthesia Post Note  Patient: Cristina [name]  Procedure(s) Performed: COLONOSCOPY WITH PROPOFOL ESOPHAGOGASTRODUODENOSCOPY (EGD) WITH PROPOFOL  Anesthesia Type: General Anesthetic complications: no   No notable events documented.   Last Vitals:  Vitals:   03/04/22 0935 03/04/22 0945  BP: (!) 153/80 (!) 150/78  Pulse: 99   Resp: 14 13  Temp:    SpO2: 100% 100%    Last Pain:  Vitals:   03/04/22 0945  TempSrc:   PainSc: 0-No pain                 Molli Barrows

## 2022-03-04 NOTE — Transfer of Care (Signed)
Immediate Anesthesia Transfer of Care Note  Patient: Cristina [name]  Procedure(s) Performed: COLONOSCOPY WITH PROPOFOL ESOPHAGOGASTRODUODENOSCOPY (EGD) WITH PROPOFOL  Patient Location: PACU  Anesthesia Type:General  Level of Consciousness: drowsy  Airway & Oxygen Therapy: Patient Spontanous Breathing  Post-op Assessment: Report given to RN and Post -op Vital signs reviewed and stable  Post vital signs: Reviewed and stable  Last Vitals:  Vitals Value Taken Time  BP 136/80 03/04/22 0925  Temp 35.6 C 03/04/22 0925  Pulse 96 03/04/22 0925  Resp 15 03/04/22 0925  SpO2 94 % 03/04/22 0925    Last Pain:  Vitals:   03/04/22 0925  TempSrc: Temporal  PainSc: Asleep         Complications: No notable events documented.

## 2022-03-05 ENCOUNTER — Encounter: Payer: Self-pay | Admitting: Internal Medicine

## 2022-03-06 LAB — SURGICAL PATHOLOGY

## 2022-05-11 ENCOUNTER — Encounter: Payer: Self-pay | Admitting: Ophthalmology

## 2022-05-13 NOTE — Discharge Instructions (Signed)
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
## 2022-05-19 ENCOUNTER — Ambulatory Visit: Payer: Medicare Other | Admitting: Anesthesiology

## 2022-05-19 ENCOUNTER — Encounter
Admission: RE | Disposition: A | Discharge status: Home / Self Care | Payer: Self-pay | Source: Home / Self Care | Attending: Ophthalmology

## 2022-05-19 ENCOUNTER — Encounter: Payer: Self-pay | Admitting: Ophthalmology

## 2022-05-19 ENCOUNTER — Ambulatory Visit
Admission: RE | Admit: 2022-05-19 | Discharge: 2022-05-19 | Disposition: A | Discharge status: Home / Self Care | Payer: Medicare Other | Attending: Ophthalmology | Admitting: Ophthalmology

## 2022-05-19 ENCOUNTER — Other Ambulatory Visit: Payer: Self-pay

## 2022-05-19 DIAGNOSIS — Z794 Long term (current) use of insulin: Secondary | ICD-10-CM | POA: Diagnosis not present

## 2022-05-19 DIAGNOSIS — I110 Hypertensive heart disease with heart failure: Secondary | ICD-10-CM | POA: Diagnosis not present

## 2022-05-19 DIAGNOSIS — Z9049 Acquired absence of other specified parts of digestive tract: Secondary | ICD-10-CM | POA: Diagnosis not present

## 2022-05-19 DIAGNOSIS — E119 Type 2 diabetes mellitus without complications: Secondary | ICD-10-CM

## 2022-05-19 DIAGNOSIS — F259 Schizoaffective disorder, unspecified: Secondary | ICD-10-CM | POA: Insufficient documentation

## 2022-05-19 DIAGNOSIS — Z09 Encounter for follow-up examination after completed treatment for conditions other than malignant neoplasm: Secondary | ICD-10-CM | POA: Diagnosis not present

## 2022-05-19 DIAGNOSIS — E1136 Type 2 diabetes mellitus with diabetic cataract: Secondary | ICD-10-CM | POA: Insufficient documentation

## 2022-05-19 DIAGNOSIS — F319 Bipolar disorder, unspecified: Secondary | ICD-10-CM | POA: Insufficient documentation

## 2022-05-19 DIAGNOSIS — H2512 Age-related nuclear cataract, left eye: Secondary | ICD-10-CM | POA: Insufficient documentation

## 2022-05-19 DIAGNOSIS — K219 Gastro-esophageal reflux disease without esophagitis: Secondary | ICD-10-CM | POA: Insufficient documentation

## 2022-05-19 DIAGNOSIS — Z8673 Personal history of transient ischemic attack (TIA), and cerebral infarction without residual deficits: Secondary | ICD-10-CM | POA: Insufficient documentation

## 2022-05-19 DIAGNOSIS — E1140 Type 2 diabetes mellitus with diabetic neuropathy, unspecified: Secondary | ICD-10-CM | POA: Diagnosis not present

## 2022-05-19 DIAGNOSIS — F419 Anxiety disorder, unspecified: Secondary | ICD-10-CM | POA: Diagnosis not present

## 2022-05-19 DIAGNOSIS — J4489 Other specified chronic obstructive pulmonary disease: Secondary | ICD-10-CM | POA: Insufficient documentation

## 2022-05-19 DIAGNOSIS — Z7984 Long term (current) use of oral hypoglycemic drugs: Secondary | ICD-10-CM | POA: Insufficient documentation

## 2022-05-19 DIAGNOSIS — I509 Heart failure, unspecified: Secondary | ICD-10-CM | POA: Diagnosis not present

## 2022-05-19 DIAGNOSIS — G4730 Sleep apnea, unspecified: Secondary | ICD-10-CM | POA: Insufficient documentation

## 2022-05-19 HISTORY — PX: CATARACT EXTRACTION W/PHACO: SHX586

## 2022-05-19 HISTORY — DX: Presence of dental prosthetic device (complete) (partial): Z97.2

## 2022-05-19 LAB — GLUCOSE, CAPILLARY: Glucose-Capillary: 153 mg/dL — ABNORMAL HIGH (ref 70–99)

## 2022-05-19 SURGERY — PHACOEMULSIFICATION, CATARACT, WITH IOL INSERTION
Anesthesia: Monitor Anesthesia Care | Site: Eye | Laterality: Left

## 2022-05-19 MED ORDER — BRIMONIDINE TARTRATE-TIMOLOL 0.2-0.5 % OP SOLN
OPHTHALMIC | Status: DC | PRN
  Administered 2022-05-19: 1 [drp] via OPHTHALMIC

## 2022-05-19 MED ORDER — SIGHTPATH DOSE#1 BSS IO SOLN
INTRAOCULAR | Status: DC | PRN
  Administered 2022-05-19: 2 mL

## 2022-05-19 MED ORDER — SIGHTPATH DOSE#1 BSS IO SOLN
INTRAOCULAR | Status: DC | PRN
  Administered 2022-05-19: 15 mL via INTRAOCULAR

## 2022-05-19 MED ORDER — SIGHTPATH DOSE#1 NA CHONDROIT SULF-NA HYALURON 40-17 MG/ML IO SOLN
INTRAOCULAR | Status: DC | PRN
  Administered 2022-05-19: 1 mL via INTRAOCULAR

## 2022-05-19 MED ORDER — SIGHTPATH DOSE#1 BSS IO SOLN
INTRAOCULAR | Status: DC | PRN
  Administered 2022-05-19: 64 mL via OPHTHALMIC

## 2022-05-19 MED ORDER — MOXIFLOXACIN HCL 0.5 % OP SOLN
OPHTHALMIC | Status: DC | PRN
  Administered 2022-05-19: .2 mL via OPHTHALMIC

## 2022-05-19 MED ORDER — ARMC OPHTHALMIC DILATING DROPS
1.0000 | OPHTHALMIC | Status: DC | PRN
  Administered 2022-05-19 (×3): 1 via OPHTHALMIC

## 2022-05-19 MED ORDER — FENTANYL CITRATE (PF) 100 MCG/2ML IJ SOLN
INTRAMUSCULAR | Status: DC | PRN
  Administered 2022-05-19: 25 ug via INTRAVENOUS

## 2022-05-19 MED ORDER — TETRACAINE HCL 0.5 % OP SOLN
1.0000 [drp] | OPHTHALMIC | Status: DC | PRN
Start: 2022-05-19 — End: 2022-05-19
  Administered 2022-05-19 (×3): 1 [drp] via OPHTHALMIC

## 2022-05-19 SURGICAL SUPPLY — 15 items
CANNULA ANT/CHMB 27G (MISCELLANEOUS) IMPLANT
CANNULA ANT/CHMB 27GA (MISCELLANEOUS) IMPLANT
CATARACT SUITE SIGHTPATH (MISCELLANEOUS) ×1 IMPLANT
FEE CATARACT SUITE SIGHTPATH (MISCELLANEOUS) ×1 IMPLANT
GLOVE BIOGEL PI IND STRL 8 (GLOVE) ×1 IMPLANT
GLOVE SURG ENC TEXT LTX SZ8 (GLOVE) ×1 IMPLANT
LENS IOL TECNIS EYHANCE 26.0 (Intraocular Lens) IMPLANT
NDL FILTER BLUNT 18X1 1/2 (NEEDLE) ×1 IMPLANT
NEEDLE FILTER BLUNT 18X1 1/2 (NEEDLE) ×1 IMPLANT
PACK VIT ANT 23G (MISCELLANEOUS) IMPLANT
RING MALYGIN (MISCELLANEOUS) IMPLANT
SUT ETHILON 10-0 CS-B-6CS-B-6 (SUTURE)
SUTURE EHLN 10-0 CS-B-6CS-B-6 (SUTURE) IMPLANT
SYR 3ML LL SCALE MARK (SYRINGE) ×1 IMPLANT
WATER STERILE IRR 250ML POUR (IV SOLUTION) ×1 IMPLANT

## 2022-05-19 NOTE — Op Note (Signed)
PREOPERATIVE DIAGNOSIS:  Nuclear sclerotic cataract of the left eye.   POSTOPERATIVE DIAGNOSIS:  Nuclear sclerotic cataract of the left eye.   OPERATIVE PROCEDURE:ORPROCALL@   SURGEON:  Birder Robson, MD.   ANESTHESIA:  Anesthesiologist: Helayne Seminole, MD CRNA: Tobie Poet, CRNA  1.      Managed anesthesia care. 2.     0.70ml of Shugarcaine was instilled following the paracentesis   COMPLICATIONS:  None.   TECHNIQUE:   Stop and chop   DESCRIPTION OF PROCEDURE:  The patient was examined and consented in the preoperative holding area where the aforementioned topical anesthesia was applied to the left eye and then brought back to the Operating Room where the left eye was prepped and draped in the usual sterile ophthalmic fashion and a lid speculum was placed. A paracentesis was created with the side port blade and the anterior chamber was filled with viscoelastic. A near clear corneal incision was performed with the steel keratome. A continuous curvilinear capsulorrhexis was performed with a cystotome followed by the capsulorrhexis forceps. Hydrodissection and hydrodelineation were carried out with BSS on a blunt cannula. The lens was removed in a stop and chop  technique and the remaining cortical material was removed with the irrigation-aspiration handpiece. The capsular bag was inflated with viscoelastic and the Technis [id number] lens was placed in the capsular bag without complication. The remaining viscoelastic was removed from the eye with the irrigation-aspiration handpiece. The wounds were hydrated. The anterior chamber was flushed with BSS and the eye was inflated to physiologic pressure. 0.84ml Vigamox was placed in the anterior chamber. The wounds were found to be water tight. The eye was dressed with Combigan. The patient was given protective glasses to wear throughout the day and a shield with which to sleep tonight. The patient was also given drops with which to begin a drop  regimen today and will follow-up with me in one day. Implant Name Type Inv. Item Serial No. Manufacturer Lot No. LRB No. Used Action  LENS IOL TECNIS EYHANCE 26.0 - [id number] Intraocular Lens LENS IOL TECNIS EYHANCE 26.0 [id number] [employer]  Left 1 Implanted    Procedure(s) with comments: CATARACT EXTRACTION PHACO AND INTRAOCULAR LENS PLACEMENT (IOC) LEFT DIABETIC 12.14 01:05.8 (Left) - Diabetic  Electronically signed: Birder Robson 05/19/2022 7:46 AM

## 2022-05-19 NOTE — Addendum Note (Signed)
Addendum  created 05/19/22 1143 by Helayne Seminole, MD   Attestation recorded in [place], [place] filed

## 2022-05-19 NOTE — H&P (Signed)
[facility]   Primary Care Physician:  Verl Blalock, NP Ophthalmologist: Dr. George Ina  Pre-Procedure History & Physical: HPI:  Cristina [name] is a 62 y.o. female here for cataract surgery.   Past Medical History:  Diagnosis Date   Abdominal hernia    Arthritis    Asthma    Bladder dysfunction    Carpal tunnel syndrome    CHF (congestive heart failure)    COPD (chronic obstructive pulmonary disease)    DDD (degenerative disc disease), lumbar    Diabetes mellitus without complication    Dyspnea    Edema    Fatty tumor    GERD (gastroesophageal reflux disease)    H/O colonoscopy    H/O tubal ligation    hx btl   History of cholecystectomy    hx of cholecystectomy   HLD (hyperlipidemia)    Hypertension    Muscle cramps    Neuropathy    Obesity    Recurrent boils    Schizoaffective disorder    Sleep apnea    Stroke    UTI (lower urinary tract infection)    Vaginitis    Wears dentures    full upper and lower    Past Surgical History:  Procedure Laterality Date   CARDIAC CATHETERIZATION Left 09/24/2014   Procedure: Left Heart Cath and Coronary Angiography;  Surgeon: Dionisio David, MD;  Location: [facility];  Service: Cardiovascular;  Laterality: Left;   CHOLECYSTECTOMY     COLONOSCOPY WITH PROPOFOL N/A 08/12/2015   Procedure: COLONOSCOPY WITH PROPOFOL;  Surgeon: Lollie Sails, MD;  Location: [facility] ENDOSCOPY;  Service: Endoscopy;  Laterality: N/A;   COLONOSCOPY WITH PROPOFOL N/A 04/23/2017   Procedure: COLONOSCOPY WITH PROPOFOL;  Surgeon: Jonathon Bellows, MD;  Location: [facility] ENDOSCOPY;  Service: Endoscopy;  Laterality: N/A;   COLONOSCOPY WITH PROPOFOL N/A 03/04/2022   Procedure: COLONOSCOPY WITH PROPOFOL;  Surgeon: Toledo, Benay Pike, MD;  Location: [facility] ENDOSCOPY;  Service: Gastroenterology;  Laterality: N/A;   ENTEROSCOPY N/A 05/28/2017   Procedure: SMALL BOWEL ENTEROSCOPY;  Surgeon: Lin Landsman, MD;  Location: [facility] ENDOSCOPY;  Service:  Gastroenterology;  Laterality: N/A;   ENTEROSCOPY N/A 06/04/2017   Procedure: RETROGRADE SMALL BOWEL ENTEROSCOPY;  Surgeon: Lin Landsman, MD;  Location: [facility] ENDOSCOPY;  Service: Gastroenterology;  Laterality: N/A;   ESOPHAGOGASTRODUODENOSCOPY (EGD) WITH PROPOFOL N/A 08/12/2015   Procedure: ESOPHAGOGASTRODUODENOSCOPY (EGD) WITH PROPOFOL;  Surgeon: Lollie Sails, MD;  Location: [facility] ENDOSCOPY;  Service: Endoscopy;  Laterality: N/A;   ESOPHAGOGASTRODUODENOSCOPY (EGD) WITH PROPOFOL N/A 03/06/2017   Procedure: ESOPHAGOGASTRODUODENOSCOPY (EGD) WITH PROPOFOL;  Surgeon: Lucilla Lame, MD;  Location: [facility] ENDOSCOPY;  Service: Endoscopy;  Laterality: N/A;   ESOPHAGOGASTRODUODENOSCOPY (EGD) WITH PROPOFOL N/A 03/04/2022   Procedure: ESOPHAGOGASTRODUODENOSCOPY (EGD) WITH PROPOFOL;  Surgeon: Toledo, Benay Pike, MD;  Location: [facility] ENDOSCOPY;  Service: Gastroenterology;  Laterality: N/A;   GIVENS CAPSULE STUDY N/A 05/25/2017   Procedure: GIVENS CAPSULE STUDY;  Surgeon: Lucilla Lame, MD;  Location: [facility] ENDOSCOPY;  Service: Endoscopy;  Laterality: N/A;   tubal ligation      Prior to Admission medications   Medication Sig Start Date End Date Taking? Authorizing Provider  acetaminophen (TYLENOL) 500 MG tablet Take 500 mg by mouth every 8 (eight) hours as needed for mild pain or moderate pain.   Yes [provider]  albuterol (PROVENTIL HFA;VENTOLIN HFA) 108 (90 Base) MCG/ACT inhaler Inhale 2 puffs into the lungs every 6 (six) hours as needed for shortness of breath.   Yes [provider]  aspirin EC 81 MG tablet Take 81 mg by mouth daily.   Yes [provider]  atorvastatin (LIPITOR) 20 MG tablet Take 20 mg by mouth at bedtime.   Yes [provider]  busPIRone (BUSPAR) 10 MG tablet Take 10 mg by mouth 2 (two) times daily.    Yes [provider]  Calcium Carbonate (CALCIUM 600 PO) Take by mouth daily.   Yes [provider]  Cholecalciferol (VITAMIN D-3 PO)  Take by mouth.   Yes [provider]  clonazePAM (KLONOPIN) 0.5 MG tablet Take 1 tablet (0.5 mg total) by mouth 2 (two) times daily. Patient taking differently: Take 0.5 mg by mouth daily. 07/18/16  Yes Gouru, Aruna, MD  clonazePAM (KLONOPIN) 1 MG tablet Take 1 mg by mouth at bedtime.   Yes [provider]  dicyclomine (BENTYL) 20 MG tablet Take 20 mg by mouth 4 (four) times daily.   Yes [provider]  diphenoxylate-atropine (LOMOTIL) 2.5-0.025 MG tablet Take 1 tablet by mouth daily as needed for diarrhea or loose stools.   Yes [provider]  famotidine (PEPCID) 20 MG tablet Take 20 mg by mouth daily.   Yes [provider]  Fluticasone Furoate-Vilanterol (BREO ELLIPTA IN) Inhale into the lungs daily.   Yes [provider]  furosemide (LASIX) 20 MG tablet Take 1 tablet (20 mg total) by mouth 2 (two) times daily. Patient taking differently: Take 40 mg by mouth daily. 07/18/16  Yes Gouru, Aruna, MD  glipiZIDE (GLUCOTROL) 5 MG tablet Take 5 mg by mouth in the morning and at bedtime.   Yes [provider]  haloperidol decanoate (HALDOL DECANOATE) 100 MG/ML injection Inject 1 mL (100 mg total) into the muscle every 30 (thirty) days. 08/02/15  Yes Hildred Priest, MD  Insulin Pen Needle 31G X 8 MM MISC PEN NEEDLES 31G X 6 MM MISC 10/07/12  Yes [provider]  lisinopril (ZESTRIL) 5 MG tablet Take 5 mg by mouth daily.   Yes [provider]  loperamide (IMODIUM A-D) 2 MG tablet Take 2 mg by mouth 4 (four) times daily as needed for diarrhea or loose stools.   Yes [provider]  Menthol (HONEY LEMON COUGH DROPS MT) Use as directed 1 lozenge in the mouth or throat 4 (four) times daily as needed (cough).   Yes [provider]  metoprolol tartrate (LOPRESSOR) 25 MG tablet Take 0.5 tablets (12.5 mg total) by mouth 2 (two) times daily. Patient taking differently: Take 25 mg by mouth daily. 07/18/16  Yes  Gouru, Illene Silver, MD  nitroGLYCERIN (NITROSTAT) 0.3 MG SL tablet Place 0.3 mg under the tongue every 5 (five) minutes as needed for chest pain.   Yes [provider]  ondansetron (ZOFRAN) 4 MG tablet Take 4 mg by mouth every 6 (six) hours as needed for nausea or vomiting.   Yes [provider]  oxybutynin (DITROPAN-XL) 5 MG 24 hr tablet Take 2 tablets (10 mg total) by mouth daily. 07/17/20  Yes Stoioff, Ronda Fairly, MD  potassium chloride (K-DUR) 10 MEQ tablet Take 10 mEq by mouth daily.   Yes [provider]  pregabalin (LYRICA) 100 MG capsule Take 100 mg by mouth 3 (three) times daily.   Yes [provider]  sitaGLIPtin (JANUVIA) 100 MG tablet Take 100 mg by mouth daily.   Yes [provider]  venlafaxine XR (EFFEXOR-XR) 75 MG 24 hr capsule Take 75 mg by mouth daily with breakfast. Take with 150mg  capsule for  a total of 225mg    Yes [provider]  venlafaxine XR (EFFEXOR-XR) 150 MG 24 hr capsule Take 150 mg by mouth daily with breakfast. Take with 75mg  capsule for a total of 225mg  Patient not taking: Reported on 05/11/2022    [provider]    Allergies as of 04/20/2022 - Review Complete 03/04/2022  Allergen Reaction Noted   Bee venom Anaphylaxis and Shortness Of Breath 12/05/2014   Shellfish allergy Anaphylaxis and Shortness Of Breath 06/07/2015   Shellfish-derived products Anaphylaxis and Shortness Of Breath 12/05/2014   Codeine  08/01/2014   Thorazine [chlorpromazine] Swelling 09/23/2014   Tylenol with codeine #3 [acetaminophen-codeine] Other (See Comments) 12/05/2014   Metronidazole Rash 06/26/2015    Family History  Problem Relation Age of Onset   CAD Mother    Lung cancer Mother    Mental illness Mother    Mental illness Father 29       suicided    Mental illness Sister    CAD Sister    Mental illness Brother    CAD Sister     Social History   Socioeconomic History   Marital status: Single    Spouse name: Not on  file   Number of children: Not on file   Years of education: Not on file   Highest education level: Not on file  Occupational History   Occupation: disabled  Tobacco Use   Smoking status: Some Days    Packs/day: 0.25    Years: 40.00    Additional pack years: 0.00    Total pack years: 10.00    Types: Cigarettes   Smokeless tobacco: Former    Types: Chew   Tobacco comments:    Sneaks cigarettes from other residents  Vaping Use   Vaping Use: Former  Substance and Sexual Activity   Alcohol use: No    Alcohol/week: 0.0 standard drinks of alcohol   Drug use: No   Sexual activity: Not on file  Other Topics Concern   Not on file  Social History Narrative   Not on file   Social Determinants of Health   Financial Resource Strain: Not on file  Food Insecurity: Not on file  Transportation Needs: Not on file  Physical Activity: Not on file  Stress: Not on file  Social Connections: Not on file  Intimate Partner Violence: Not on file    Review of Systems: See HPI, otherwise negative ROS  Physical Exam: BP 137/79   Pulse 70   Temp (!) 97.3 F (36.3 C) (Temporal)   Resp 15   Ht 5\' 2"  (1.575 m)   Wt 96.5 kg   SpO2 94%   BMI 38.92 kg/m  General:   Alert, cooperative in NAD Head:  Normocephalic and atraumatic. Respiratory:  Normal work of breathing. Cardiovascular:  RRR  Impression/Plan: Cristina [name] is here for cataract surgery.  Risks, benefits, limitations, and alternatives regarding cataract surgery have been reviewed with the patient.  Questions have been answered.  All parties agreeable.   Birder Robson, MD  05/19/2022, 7:15 AM

## 2022-05-19 NOTE — Anesthesia Postprocedure Evaluation (Signed)
Anesthesia Post Note  Patient: Cristina [name]  Procedure(s) Performed: CATARACT EXTRACTION PHACO AND INTRAOCULAR LENS PLACEMENT (IOC) LEFT DIABETIC 12.14 01:05.8 (Left: Eye)  Patient location during evaluation: PACU Anesthesia Type: MAC Level of consciousness: awake and alert Pain management: satisfactory to patient Vital Signs Assessment: post-procedure vital signs reviewed and stable Anesthetic complications: no   No notable events documented.   Last Vitals:  Vitals:   05/19/22 0747 05/19/22 0752  BP: 130/63 134/70  Pulse: 69 76  Resp: 15 20  Temp: (!) 36.3 C (!) 36.3 C  SpO2: 100% 98%    Last Pain:  Vitals:   05/19/22 0752  TempSrc:   PainSc: 0-No pain                 Helayne Seminole

## 2022-05-19 NOTE — Transfer of Care (Signed)
Immediate Anesthesia Transfer of Care Note  Patient: Cristina [name]  Procedure(s) Performed: CATARACT EXTRACTION PHACO AND INTRAOCULAR LENS PLACEMENT (IOC) LEFT DIABETIC 12.14 01:05.8 (Left: Eye)  Patient Location: PACU  Anesthesia Type: MAC  Level of Consciousness: awake, alert  and patient cooperative  Airway and Oxygen Therapy: Patient Spontanous Breathing and Patient connected to supplemental oxygen  Post-op Assessment: Post-op Vital signs reviewed, Patient's Cardiovascular Status Stable, Respiratory Function Stable, Patent Airway and No signs of Nausea or vomiting  Post-op Vital Signs: Reviewed and stable  Complications: No notable events documented.

## 2022-05-20 ENCOUNTER — Encounter: Payer: Self-pay | Admitting: Ophthalmology

## 2022-05-25 ENCOUNTER — Encounter: Payer: Self-pay | Admitting: Ophthalmology

## 2022-05-28 NOTE — Discharge Instructions (Signed)

## 2022-06-02 ENCOUNTER — Encounter: Payer: Self-pay | Admitting: Ophthalmology

## 2022-06-02 ENCOUNTER — Ambulatory Visit: Payer: Medicare Other | Admitting: Anesthesiology

## 2022-06-02 ENCOUNTER — Ambulatory Visit
Admission: RE | Admit: 2022-06-02 | Discharge: 2022-06-02 | Disposition: A | Discharge status: Home / Self Care | Payer: Medicare Other | Attending: Ophthalmology | Admitting: Ophthalmology

## 2022-06-02 ENCOUNTER — Encounter
Admission: RE | Disposition: A | Discharge status: Home / Self Care | Payer: Self-pay | Source: Home / Self Care | Attending: Ophthalmology

## 2022-06-02 ENCOUNTER — Other Ambulatory Visit: Payer: Self-pay

## 2022-06-02 DIAGNOSIS — E785 Hyperlipidemia, unspecified: Secondary | ICD-10-CM | POA: Insufficient documentation

## 2022-06-02 DIAGNOSIS — E1136 Type 2 diabetes mellitus with diabetic cataract: Secondary | ICD-10-CM | POA: Insufficient documentation

## 2022-06-02 DIAGNOSIS — E119 Type 2 diabetes mellitus without complications: Secondary | ICD-10-CM

## 2022-06-02 DIAGNOSIS — G4730 Sleep apnea, unspecified: Secondary | ICD-10-CM | POA: Diagnosis not present

## 2022-06-02 DIAGNOSIS — Z9049 Acquired absence of other specified parts of digestive tract: Secondary | ICD-10-CM | POA: Diagnosis not present

## 2022-06-02 DIAGNOSIS — J449 Chronic obstructive pulmonary disease, unspecified: Secondary | ICD-10-CM | POA: Diagnosis not present

## 2022-06-02 DIAGNOSIS — I110 Hypertensive heart disease with heart failure: Secondary | ICD-10-CM | POA: Diagnosis not present

## 2022-06-02 DIAGNOSIS — K219 Gastro-esophageal reflux disease without esophagitis: Secondary | ICD-10-CM | POA: Diagnosis not present

## 2022-06-02 DIAGNOSIS — Z8249 Family history of ischemic heart disease and other diseases of the circulatory system: Secondary | ICD-10-CM | POA: Diagnosis not present

## 2022-06-02 DIAGNOSIS — H2511 Age-related nuclear cataract, right eye: Secondary | ICD-10-CM | POA: Diagnosis not present

## 2022-06-02 DIAGNOSIS — F17210 Nicotine dependence, cigarettes, uncomplicated: Secondary | ICD-10-CM | POA: Diagnosis not present

## 2022-06-02 DIAGNOSIS — Z6838 Body mass index (BMI) 38.0-38.9, adult: Secondary | ICD-10-CM | POA: Insufficient documentation

## 2022-06-02 DIAGNOSIS — I509 Heart failure, unspecified: Secondary | ICD-10-CM | POA: Diagnosis not present

## 2022-06-02 DIAGNOSIS — E669 Obesity, unspecified: Secondary | ICD-10-CM | POA: Diagnosis not present

## 2022-06-02 HISTORY — PX: CATARACT EXTRACTION W/PHACO: SHX586

## 2022-06-02 LAB — GLUCOSE, CAPILLARY: Glucose-Capillary: 179 mg/dL — ABNORMAL HIGH (ref 70–99)

## 2022-06-02 SURGERY — PHACOEMULSIFICATION, CATARACT, WITH IOL INSERTION
Anesthesia: Monitor Anesthesia Care | Site: Eye | Laterality: Right

## 2022-06-02 MED ORDER — SIGHTPATH DOSE#1 NA CHONDROIT SULF-NA HYALURON 40-17 MG/ML IO SOLN
INTRAOCULAR | Status: DC | PRN
  Administered 2022-06-02: 1 mL via INTRAOCULAR

## 2022-06-02 MED ORDER — SIGHTPATH DOSE#1 BSS IO SOLN
INTRAOCULAR | Status: DC | PRN
  Administered 2022-06-02: 15 mL via INTRAOCULAR

## 2022-06-02 MED ORDER — BRIMONIDINE TARTRATE-TIMOLOL 0.2-0.5 % OP SOLN
OPHTHALMIC | Status: DC | PRN
  Administered 2022-06-02: 1 [drp] via OPHTHALMIC

## 2022-06-02 MED ORDER — TETRACAINE HCL 0.5 % OP SOLN
1.0000 [drp] | OPHTHALMIC | Status: DC | PRN
  Administered 2022-06-02 (×3): 1 [drp] via OPHTHALMIC

## 2022-06-02 MED ORDER — MOXIFLOXACIN HCL 0.5 % OP SOLN
OPHTHALMIC | Status: DC | PRN
  Administered 2022-06-02: .2 mL via OPHTHALMIC

## 2022-06-02 MED ORDER — SIGHTPATH DOSE#1 BSS IO SOLN
INTRAOCULAR | Status: DC | PRN
  Administered 2022-06-02: 57 mL via OPHTHALMIC

## 2022-06-02 MED ORDER — SIGHTPATH DOSE#1 BSS IO SOLN
INTRAOCULAR | Status: DC | PRN
  Administered 2022-06-02: 2 mL

## 2022-06-02 MED ORDER — FENTANYL CITRATE (PF) 100 MCG/2ML IJ SOLN
INTRAMUSCULAR | Status: DC | PRN
  Administered 2022-06-02: 50 ug via INTRAVENOUS

## 2022-06-02 MED ORDER — LACTATED RINGERS IV SOLN: INTRAVENOUS | Status: DC

## 2022-06-02 MED ORDER — ARMC OPHTHALMIC DILATING DROPS
1.0000 | OPHTHALMIC | Status: DC | PRN
  Administered 2022-06-02 (×3): 1 via OPHTHALMIC

## 2022-06-02 SURGICAL SUPPLY — 14 items
CANNULA ANT/CHMB 27G (MISCELLANEOUS) IMPLANT
CANNULA ANT/CHMB 27GA (MISCELLANEOUS) IMPLANT
CATARACT SUITE SIGHTPATH (MISCELLANEOUS) ×1 IMPLANT
FEE CATARACT SUITE SIGHTPATH (MISCELLANEOUS) ×1 IMPLANT
GLOVE BIOGEL PI IND STRL 8 (GLOVE) ×1 IMPLANT
GLOVE SURG ENC TEXT LTX SZ8 (GLOVE) ×1 IMPLANT
LENS IOL TECNIS EYHANCE 26.0 (Intraocular Lens) IMPLANT
NDL FILTER BLUNT 18X1 1/2 (NEEDLE) ×1 IMPLANT
NEEDLE FILTER BLUNT 18X1 1/2 (NEEDLE) ×1 IMPLANT
PACK VIT ANT 23G (MISCELLANEOUS) IMPLANT
RING MALYGIN (MISCELLANEOUS) IMPLANT
SUT ETHILON 10-0 CS-B-6CS-B-6 (SUTURE)
SUTURE EHLN 10-0 CS-B-6CS-B-6 (SUTURE) IMPLANT
SYR 3ML LL SCALE MARK (SYRINGE) ×1 IMPLANT

## 2022-06-02 NOTE — Op Note (Signed)
PREOPERATIVE DIAGNOSIS:  Nuclear sclerotic cataract of the right eye.   POSTOPERATIVE DIAGNOSIS:  Cataract   OPERATIVE PROCEDURE:ORPROCALL@   SURGEON:  Galen Manila, MD.   ANESTHESIA:  Anesthesiologist: Stephanie Coup, MD CRNA: Barbette Hair, CRNA  1.      Managed anesthesia care. 2.      0.52ml of Shugarcaine was instilled in the eye following the paracentesis.   COMPLICATIONS:  None.   TECHNIQUE:   Stop and chop   DESCRIPTION OF PROCEDURE:  The patient was examined and consented in the preoperative holding area where the aforementioned topical anesthesia was applied to the right eye and then brought back to the Operating Room where the right eye was prepped and draped in the usual sterile ophthalmic fashion and a lid speculum was placed. A paracentesis was created with the side port blade and the anterior chamber was filled with viscoelastic. A near clear corneal incision was performed with the steel keratome. A continuous curvilinear capsulorrhexis was performed with a cystotome followed by the capsulorrhexis forceps. Hydrodissection and hydrodelineation were carried out with BSS on a blunt cannula. The lens was removed in a stop and chop  technique and the remaining cortical material was removed with the irrigation-aspiration handpiece. The capsular bag was inflated with viscoelastic and the Technis [id number]  lens was placed in the capsular bag without complication. The remaining viscoelastic was removed from the eye with the irrigation-aspiration handpiece. The wounds were hydrated. The anterior chamber was flushed with BSS and the eye was inflated to physiologic pressure. 0.60ml of Vigamox was placed in the anterior chamber. The wounds were found to be water tight. The eye was dressed with Combigan. The patient was given protective glasses to wear throughout the day and a shield with which to sleep tonight. The patient was also given drops with which to begin a drop regimen today and will  follow-up with me in one day. Implant Name Type Inv. Item Serial No. Manufacturer Lot No. LRB No. Used Action  LENS IOL TECNIS EYHANCE 26.0 - [id number] Intraocular Lens LENS IOL TECNIS EYHANCE 26.0 [id number] [employer]  Right 1 Implanted   Procedure(s) with comments: CATARACT EXTRACTION PHACO AND INTRAOCULAR LENS PLACEMENT (IOC) RIGHT DIABETIC 9.17 00:59.2 (Right) - Diabetic  Electronically signed: Galen Manila 06/02/2022 7:48 AM

## 2022-06-02 NOTE — Anesthesia Postprocedure Evaluation (Signed)
Anesthesia Post Note  Patient: Cristina [name]  Procedure(s) Performed: CATARACT EXTRACTION PHACO AND INTRAOCULAR LENS PLACEMENT (IOC) RIGHT DIABETIC 9.17 00:59.2 (Right: Eye)  Patient location during evaluation: PACU Anesthesia Type: MAC Level of consciousness: awake and alert Pain management: pain level controlled Vital Signs Assessment: post-procedure vital signs reviewed and stable Respiratory status: spontaneous breathing, nonlabored ventilation, respiratory function stable and patient connected to nasal cannula oxygen Cardiovascular status: stable and blood pressure returned to baseline Postop Assessment: no apparent nausea or vomiting Anesthetic complications: no  No notable events documented.   Last Vitals:  Vitals:   06/02/22 0748 06/02/22 0753  BP: 111/78 124/68  Pulse: 76 69  Resp: 18 18  Temp: 36.5 C (!) 36.4 C  SpO2: 100% 98%    Last Pain:  Vitals:   06/02/22 0753  TempSrc:   PainSc: 0-No pain                 Stephanie Coup

## 2022-06-02 NOTE — Transfer of Care (Signed)
Immediate Anesthesia Transfer of Care Note  Patient: Cristina [name]  Procedure(s) Performed: CATARACT EXTRACTION PHACO AND INTRAOCULAR LENS PLACEMENT (IOC) RIGHT DIABETIC 9.17 00:59.2 (Right: Eye)  Patient Location: PACU  Anesthesia Type: MAC  Level of Consciousness: awake, alert  and patient cooperative  Airway and Oxygen Therapy: Patient Spontanous Breathing and Patient connected to supplemental oxygen  Post-op Assessment: Post-op Vital signs reviewed, Patient's Cardiovascular Status Stable, Respiratory Function Stable, Patent Airway and No signs of Nausea or vomiting  Post-op Vital Signs: Reviewed and stable  Complications: No notable events documented.

## 2022-06-02 NOTE — H&P (Signed)
[facility]   Primary Care Physician:  Ellan Lambert, NP Ophthalmologist: Dr. Druscilla Brownie  Pre-Procedure History & Physical: HPI:  Cristina [name] is a 62 y.o. female here for cataract surgery.   Past Medical History:  Diagnosis Date   Abdominal hernia    Arthritis    Asthma    Bladder dysfunction    Carpal tunnel syndrome    CHF (congestive heart failure)    COPD (chronic obstructive pulmonary disease)    DDD (degenerative disc disease), lumbar    Diabetes mellitus without complication    Dyspnea    Edema    Fatty tumor    GERD (gastroesophageal reflux disease)    H/O colonoscopy    H/O tubal ligation    hx btl   History of cholecystectomy    hx of cholecystectomy   HLD (hyperlipidemia)    Hypertension    Muscle cramps    Neuropathy    Obesity    Recurrent boils    Schizoaffective disorder    Sleep apnea    Stroke    UTI (lower urinary tract infection)    Vaginitis    Wears dentures    full upper and lower    Past Surgical History:  Procedure Laterality Date   CARDIAC CATHETERIZATION Left 09/24/2014   Procedure: Left Heart Cath and Coronary Angiography;  Surgeon: Laurier Nancy, MD;  Location: [facility] INVASIVE CV LAB;  Service: Cardiovascular;  Laterality: Left;   CATARACT EXTRACTION W/PHACO Left 05/19/2022   Procedure: CATARACT EXTRACTION PHACO AND INTRAOCULAR LENS PLACEMENT (IOC) LEFT DIABETIC 12.14 01:05.8;  Surgeon: Galen Manila, MD;  Location: [facility];  Service: Ophthalmology;  Laterality: Left;  Diabetic   CHOLECYSTECTOMY     COLONOSCOPY WITH PROPOFOL N/A 08/12/2015   Procedure: COLONOSCOPY WITH PROPOFOL;  Surgeon: Christena Deem, MD;  Location: [facility] ENDOSCOPY;  Service: Endoscopy;  Laterality: N/A;   COLONOSCOPY WITH PROPOFOL N/A 04/23/2017   Procedure: COLONOSCOPY WITH PROPOFOL;  Surgeon: Wyline Mood, MD;  Location: [facility] ENDOSCOPY;  Service: Endoscopy;  Laterality: N/A;   COLONOSCOPY WITH PROPOFOL N/A 03/04/2022   Procedure: COLONOSCOPY  WITH PROPOFOL;  Surgeon: Toledo, Boykin Nearing, MD;  Location: [facility] ENDOSCOPY;  Service: Gastroenterology;  Laterality: N/A;   ENTEROSCOPY N/A 05/28/2017   Procedure: SMALL BOWEL ENTEROSCOPY;  Surgeon: Toney Reil, MD;  Location: [facility] ENDOSCOPY;  Service: Gastroenterology;  Laterality: N/A;   ENTEROSCOPY N/A 06/04/2017   Procedure: RETROGRADE SMALL BOWEL ENTEROSCOPY;  Surgeon: Toney Reil, MD;  Location: [facility] ENDOSCOPY;  Service: Gastroenterology;  Laterality: N/A;   ESOPHAGOGASTRODUODENOSCOPY (EGD) WITH PROPOFOL N/A 08/12/2015   Procedure: ESOPHAGOGASTRODUODENOSCOPY (EGD) WITH PROPOFOL;  Surgeon: Christena Deem, MD;  Location: [facility] ENDOSCOPY;  Service: Endoscopy;  Laterality: N/A;   ESOPHAGOGASTRODUODENOSCOPY (EGD) WITH PROPOFOL N/A 03/06/2017   Procedure: ESOPHAGOGASTRODUODENOSCOPY (EGD) WITH PROPOFOL;  Surgeon: Midge Minium, MD;  Location: [facility] ENDOSCOPY;  Service: Endoscopy;  Laterality: N/A;   ESOPHAGOGASTRODUODENOSCOPY (EGD) WITH PROPOFOL N/A 03/04/2022   Procedure: ESOPHAGOGASTRODUODENOSCOPY (EGD) WITH PROPOFOL;  Surgeon: Toledo, Boykin Nearing, MD;  Location: [facility] ENDOSCOPY;  Service: Gastroenterology;  Laterality: N/A;   GIVENS CAPSULE STUDY N/A 05/25/2017   Procedure: GIVENS CAPSULE STUDY;  Surgeon: Midge Minium, MD;  Location: [facility] ENDOSCOPY;  Service: Endoscopy;  Laterality: N/A;   tubal ligation      Prior to Admission medications   Medication Sig Start Date End Date Taking? Authorizing Provider  acetaminophen (TYLENOL) 500 MG tablet Take 500 mg by mouth every 8 (eight) hours as needed for mild pain  or moderate pain.   Yes [provider]  albuterol (PROVENTIL HFA;VENTOLIN HFA) 108 (90 Base) MCG/ACT inhaler Inhale 2 puffs into the lungs every 6 (six) hours as needed for shortness of breath.   Yes [provider]  aspirin EC 81 MG tablet Take 81 mg by mouth daily.   Yes [provider]  atorvastatin (LIPITOR) 20 MG tablet Take 20 mg by mouth at bedtime.    Yes [provider]  busPIRone (BUSPAR) 10 MG tablet Take 10 mg by mouth 2 (two) times daily.    Yes [provider]  Calcium Carbonate (CALCIUM 600 PO) Take by mouth daily.   Yes [provider]  Cholecalciferol (VITAMIN D-3 PO) Take by mouth.   Yes [provider]  clonazePAM (KLONOPIN) 0.5 MG tablet Take 1 tablet (0.5 mg total) by mouth 2 (two) times daily. Patient taking differently: Take 0.5 mg by mouth daily. 07/18/16  Yes Gouru, Aruna, MD  clonazePAM (KLONOPIN) 1 MG tablet Take 1 mg by mouth at bedtime.   Yes [provider]  dicyclomine (BENTYL) 20 MG tablet Take 20 mg by mouth 4 (four) times daily.   Yes [provider]  diphenoxylate-atropine (LOMOTIL) 2.5-0.025 MG tablet Take 1 tablet by mouth daily as needed for diarrhea or loose stools.   Yes [provider]  famotidine (PEPCID) 20 MG tablet Take 20 mg by mouth daily.   Yes [provider]  Fluticasone Furoate-Vilanterol (BREO ELLIPTA IN) Inhale into the lungs daily.   Yes [provider]  furosemide (LASIX) 20 MG tablet Take 1 tablet (20 mg total) by mouth 2 (two) times daily. Patient taking differently: Take 40 mg by mouth daily. 07/18/16  Yes Gouru, Aruna, MD  glipiZIDE (GLUCOTROL) 5 MG tablet Take 5 mg by mouth in the morning and at bedtime.   Yes [provider]  haloperidol decanoate (HALDOL DECANOATE) 100 MG/ML injection Inject 1 mL (100 mg total) into the muscle every 30 (thirty) days. 08/02/15  Yes Jimmy Footman, MD  lisinopril (ZESTRIL) 5 MG tablet Take 5 mg by mouth daily.   Yes [provider]  loperamide (IMODIUM A-D) 2 MG tablet Take 2 mg by mouth 4 (four) times daily as needed for diarrhea or loose stools.   Yes [provider]  Menthol (HONEY LEMON COUGH DROPS MT) Use as directed 1 lozenge in the mouth or throat 4 (four) times daily as needed (cough).   Yes [provider]  metoprolol tartrate  (LOPRESSOR) 25 MG tablet Take 0.5 tablets (12.5 mg total) by mouth 2 (two) times daily. Patient taking differently: Take 25 mg by mouth daily. 07/18/16  Yes Gouru, Aruna, MD  ondansetron (ZOFRAN) 4 MG tablet Take 4 mg by mouth every 6 (six) hours as needed for nausea or vomiting.   Yes [provider]  oxybutynin (DITROPAN-XL) 5 MG 24 hr tablet Take 2 tablets (10 mg total) by mouth daily. 07/17/20  Yes Stoioff, Verna Czech, MD  potassium chloride (K-DUR) 10 MEQ tablet Take 10 mEq by mouth daily.   Yes [provider]  pregabalin (LYRICA) 100 MG capsule Take 100 mg by mouth 3 (three) times daily.   Yes [provider]  sitaGLIPtin (JANUVIA) 100 MG tablet Take 100 mg by mouth daily.   Yes [provider]  venlafaxine XR (EFFEXOR-XR) 150 MG 24 hr capsule Take 150 mg by mouth daily with breakfast. Take with  capsule for a total of    Yes [provider]  venlafaxine XR (EFFEXOR-XR) 75 MG 24 hr capsule Take 75 mg by mouth daily with breakfast. Take with  capsule for a total of    Yes [provider]  Insulin Pen Needle 31G X 8 MM MISC PEN NEEDLES 31G X 6 MM MISC 10/07/12   [provider]  nitroGLYCERIN (NITROSTAT) 0.3 MG SL tablet Place 0.3 mg under the tongue every 5 (five) minutes as needed for chest pain.    [provider]    Allergies as of 04/20/2022 - Review Complete 03/04/2022  Allergen Reaction Noted   Bee venom Anaphylaxis and Shortness Of Breath 12/05/2014   Shellfish allergy Anaphylaxis and Shortness Of Breath 06/07/2015   Shellfish-derived products Anaphylaxis and Shortness Of Breath 12/05/2014   Codeine  08/01/2014   Thorazine [chlorpromazine] Swelling 09/23/2014   Tylenol with codeine #3 [acetaminophen-codeine] Other (See Comments) 12/05/2014   Metronidazole Rash 06/26/2015    Family History  Problem Relation Age of Onset   CAD Mother    Lung cancer Mother    Mental illness Mother    Mental  illness Father 2       suicided    Mental illness Sister    CAD Sister    Mental illness Brother    CAD Sister     Social History   Socioeconomic History   Marital status: Single    Spouse name: Not on file   Number of children: Not on file   Years of education: Not on file   Highest education level: Not on file  Occupational History   Occupation: disabled  Tobacco Use   Smoking status: Some Days    Packs/day: 0.25    Years: 40.00    Additional pack years: 0.00    Total pack years: 10.00    Types: Cigarettes   Smokeless tobacco: Former    Types: Chew   Tobacco comments:    Sneaks cigarettes from other residents  Vaping Use   Vaping Use: Former  Substance and Sexual Activity   Alcohol use: No    Alcohol/week: 0.0 standard drinks of alcohol   Drug use: No   Sexual activity: Not on file  Other Topics Concern   Not on file  Social History Narrative   Not on file   Social Determinants of Health   Financial Resource Strain: Not on file  Food Insecurity: Not on file  Transportation Needs: Not on file  Physical Activity: Not on file  Stress: Not on file  Social Connections: Not on file  Intimate Partner Violence: Not on file    Review of Systems: See HPI, otherwise negative ROS  Physical Exam: BP 121/67   Pulse 78   Temp (!) 97.4 F (36.3 C) (Temporal)   Resp 15   Ht  (1.575 m)   Wt 95.3 kg   SpO2 96%   BMI 38.41 kg/m  General:   Alert, cooperative in NAD Head:  Normocephalic and atraumatic. Respiratory:  Normal work of breathing. Cardiovascular:  RRR  Impression/Plan: Cristina [name] is here for cataract surgery.  Risks, benefits, limitations, and alternatives regarding cataract surgery have been reviewed with the patient.  Questions have been answered.  All parties agreeable.   Galen Manila, MD  06/02/2022, 7:15 AM
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
## 2022-06-03 ENCOUNTER — Encounter: Payer: Self-pay | Admitting: Ophthalmology

## 2022-07-16 ENCOUNTER — Encounter: Payer: Self-pay | Admitting: *Deleted

## 2022-07-17 ENCOUNTER — Ambulatory Visit: Payer: Medicare Other | Admitting: Registered Nurse

## 2022-07-17 ENCOUNTER — Other Ambulatory Visit: Payer: Self-pay

## 2022-07-17 ENCOUNTER — Encounter: Payer: Self-pay | Admitting: *Deleted

## 2022-07-17 ENCOUNTER — Ambulatory Visit
Admission: RE | Admit: 2022-07-17 | Discharge: 2022-07-17 | Disposition: A | Discharge status: Home / Self Care | Payer: Medicare Other | Attending: Gastroenterology | Admitting: Gastroenterology

## 2022-07-17 ENCOUNTER — Encounter
Admission: RE | Disposition: A | Discharge status: Home / Self Care | Payer: Self-pay | Source: Home / Self Care | Attending: Gastroenterology

## 2022-07-17 DIAGNOSIS — I509 Heart failure, unspecified: Secondary | ICD-10-CM | POA: Diagnosis not present

## 2022-07-17 DIAGNOSIS — E785 Hyperlipidemia, unspecified: Secondary | ICD-10-CM | POA: Insufficient documentation

## 2022-07-17 DIAGNOSIS — F259 Schizoaffective disorder, unspecified: Secondary | ICD-10-CM | POA: Insufficient documentation

## 2022-07-17 DIAGNOSIS — I110 Hypertensive heart disease with heart failure: Secondary | ICD-10-CM | POA: Insufficient documentation

## 2022-07-17 DIAGNOSIS — Z7951 Long term (current) use of inhaled steroids: Secondary | ICD-10-CM | POA: Insufficient documentation

## 2022-07-17 DIAGNOSIS — K317 Polyp of stomach and duodenum: Secondary | ICD-10-CM | POA: Diagnosis present

## 2022-07-17 DIAGNOSIS — K2970 Gastritis, unspecified, without bleeding: Secondary | ICD-10-CM | POA: Insufficient documentation

## 2022-07-17 DIAGNOSIS — Z7982 Long term (current) use of aspirin: Secondary | ICD-10-CM | POA: Insufficient documentation

## 2022-07-17 DIAGNOSIS — G4730 Sleep apnea, unspecified: Secondary | ICD-10-CM | POA: Diagnosis not present

## 2022-07-17 DIAGNOSIS — E119 Type 2 diabetes mellitus without complications: Secondary | ICD-10-CM | POA: Diagnosis not present

## 2022-07-17 DIAGNOSIS — Z79899 Other long term (current) drug therapy: Secondary | ICD-10-CM | POA: Insufficient documentation

## 2022-07-17 DIAGNOSIS — D132 Benign neoplasm of duodenum: Secondary | ICD-10-CM | POA: Insufficient documentation

## 2022-07-17 DIAGNOSIS — Z7984 Long term (current) use of oral hypoglycemic drugs: Secondary | ICD-10-CM | POA: Diagnosis not present

## 2022-07-17 DIAGNOSIS — K219 Gastro-esophageal reflux disease without esophagitis: Secondary | ICD-10-CM | POA: Insufficient documentation

## 2022-07-17 HISTORY — PX: ESOPHAGOGASTRODUODENOSCOPY (EGD) WITH PROPOFOL: SHX5813

## 2022-07-17 LAB — GLUCOSE, CAPILLARY: Glucose-Capillary: 164 mg/dL — ABNORMAL HIGH (ref 70–99)

## 2022-07-17 SURGERY — ESOPHAGOGASTRODUODENOSCOPY (EGD) WITH PROPOFOL
Anesthesia: General

## 2022-07-17 MED ORDER — PROPOFOL 10 MG/ML IV BOLUS
INTRAVENOUS | Status: DC | PRN
  Administered 2022-07-17: 50 mg via INTRAVENOUS

## 2022-07-17 MED ORDER — GLYCOPYRROLATE 0.2 MG/ML IJ SOLN
INTRAMUSCULAR | Status: DC | PRN
  Administered 2022-07-17: .2 mg via INTRAVENOUS

## 2022-07-17 MED ORDER — LIDOCAINE HCL (CARDIAC) PF 100 MG/5ML IV SOSY
PREFILLED_SYRINGE | INTRAVENOUS | Status: DC | PRN
  Administered 2022-07-17: 40 mg via INTRAVENOUS

## 2022-07-17 MED ORDER — PROPOFOL 500 MG/50ML IV EMUL
INTRAVENOUS | Status: DC | PRN
  Administered 2022-07-17: 100 ug/kg/min via INTRAVENOUS

## 2022-07-17 MED ORDER — SODIUM CHLORIDE 0.9 % IV SOLN: INTRAVENOUS | Status: DC

## 2022-07-17 NOTE — H&P (Signed)
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 No blood thinners. No known family history of GI malignancies. No report of significant neck or abdominal surgeries.    Current Facility-Administered Medications:    0.9 %  sodium chloride infusion, , Intravenous, Continuous, Jovi Alvizo, Rossie Muskrat, MD, Last Rate: 20 mL/hr at 07/17/22 1318, Continued from Pre-op at 07/17/22 1318  Medications Prior to Admission  Medication Sig Dispense Refill Last Dose   aspirin EC 81 MG tablet Take 81 mg by mouth daily.   07/16/2022   atorvastatin (LIPITOR) 20 MG tablet Take 20 mg by mouth at bedtime.   07/16/2022   busPIRone (BUSPAR) 10 MG tablet Take 10 mg by mouth 2 (two) times daily.    07/16/2022   Calcium Carbonate (CALCIUM 600 PO) Take by mouth daily.   07/16/2022   Cholecalciferol (VITAMIN D-3 PO) Take by mouth.   07/16/2022   clonazePAM (KLONOPIN) 0.5 MG tablet Take 1 tablet (0.5 mg total) by mouth 2 (two) times daily. (Patient taking differently: Take 0.5 mg by mouth daily.) 15 tablet 0 07/16/2022   clonazePAM (KLONOPIN) 1 MG tablet Take 1 mg by mouth at bedtime.   07/16/2022   dicyclomine (BENTYL) 20 MG tablet Take 20 mg by mouth 4 (four) times daily.   07/16/2022   diphenoxylate-atropine (LOMOTIL) 2.5-0.025 MG tablet Take 1 tablet by mouth daily as needed for diarrhea or loose stools.   07/16/2022   famotidine (PEPCID) 20 MG tablet Take 20 mg by mouth daily.   07/16/2022   Fluticasone Furoate-Vilanterol (BREO ELLIPTA IN) Inhale into the lungs daily.   07/17/2022   glipiZIDE (GLUCOTROL) 5 MG tablet Take 5 mg by mouth in the morning and at bedtime.   07/16/2022   lisinopril (ZESTRIL) 5 MG tablet Take 5 mg by mouth daily.    07/17/2022   loperamide (IMODIUM A-D) 2 MG tablet Take 2 mg by mouth 4 (four) times daily as needed for diarrhea or loose stools.   07/16/2022   Menthol (HONEY LEMON COUGH DROPS MT) Use as directed 1 lozenge in the mouth or throat 4 (four) times daily as needed (cough).   07/16/2022   metoprolol tartrate (LOPRESSOR) 25 MG tablet Take 0.5 tablets (12.5 mg total) by mouth 2 (two) times daily. (Patient taking differently: Take 25 mg by mouth daily.)   07/17/2022   nitroGLYCERIN (NITROSTAT) 0.3 MG SL tablet Place 0.3 mg under the tongue every 5 (five) minutes as needed for chest pain.   07/16/2022   ondansetron (ZOFRAN) 4 MG tablet Take 4 mg by mouth every 6 (six) hours as needed for nausea or vomiting.   07/16/2022   oxybutynin (DITROPAN-XL) 5 MG 24 hr tablet Take 2 tablets (10 mg total) by mouth daily. 30 tablet 3 07/16/2022   potassium chloride (K-DUR) 10 MEQ tablet Take 10 mEq by mouth daily.   07/16/2022   pregabalin (LYRICA) 100 MG capsule Take 100 mg by mouth 3 (three) times daily.   07/16/2022   sitaGLIPtin (JANUVIA) 100 MG tablet Take 100 mg by mouth daily.   07/16/2022   venlafaxine XR (EFFEXOR-XR) 150 MG 24 hr capsule Take 150 mg by mouth daily with breakfast. Take with 75mg  capsule for a  total of 225mg    07/16/2022   venlafaxine XR (EFFEXOR-XR) 75 MG 24 hr capsule Take 75 mg by mouth daily with breakfast. Take with 150mg  capsule for a total of 225mg    07/16/2022   acetaminophen (TYLENOL) 500 MG tablet Take 500 mg by mouth every 8 (eight) hours as needed for mild pain or moderate pain.      albuterol (PROVENTIL HFA;VENTOLIN HFA) 108 (90 Base) MCG/ACT inhaler Inhale 2 puffs into the lungs every 6 (six) hours as needed for shortness of breath.      furosemide (LASIX) 20 MG tablet Take 1 tablet (20 mg total) by mouth 2 (two) times daily. (Patient taking differently: Take 40 mg by mouth daily.) 60 tablet 0    haloperidol decanoate (HALDOL DECANOATE) 100 MG/ML injection Inject 1 mL (100 mg total) into the  muscle every 30 (thirty) days. 1 mL 0    Insulin Pen Needle 31G X 8 MM MISC PEN NEEDLES 31G X 6 MM MISC        Allergies  Allergen Reactions   Bee Venom Anaphylaxis and Shortness Of Breath    Stated by Patient   Shellfish Allergy Anaphylaxis and Shortness Of Breath    Stated by Patient Stated by Patient    Shellfish-Derived Products Anaphylaxis and Shortness Of Breath    Stated by Patient   Codeine    Thorazine [Chlorpromazine] Swelling   Tylenol With Codeine #3 [Acetaminophen-Codeine] Other (See Comments)    Hallucinations   Metronidazole Rash     Past Medical History:  Diagnosis Date   Abdominal hernia    Arthritis    Asthma    Bladder dysfunction    Carpal tunnel syndrome    CHF (congestive heart failure) (HCC)    COPD (chronic obstructive pulmonary disease) (HCC)    DDD (degenerative disc disease), lumbar    Diabetes mellitus without complication (HCC)    Dyspnea    Edema    Fatty tumor    GERD (gastroesophageal reflux disease)    H/O colonoscopy    H/O tubal ligation    hx btl   History of cholecystectomy    hx of cholecystectomy   HLD (hyperlipidemia)    Hypertension    Muscle cramps    Neuropathy    Obesity    Recurrent boils    Schizoaffective disorder (HCC)    Sleep apnea    Stroke (HCC)    UTI (lower urinary tract infection)    Vaginitis    Wears dentures    full upper and lower    Review of systems:  Otherwise negative.    Physical Exam  Gen: Alert, oriented. Appears stated age.  HEENT: PERRLA. Lungs: No respiratory distress CV: RRR Abd: soft, benign, no masses Ext: No edema    Planned procedures: Proceed with EGD. The patient understands the nature of the planned procedure, indications, risks, alternatives and potential complications including but not limited to bleeding, infection, perforation, damage to internal organs and possible oversedation/side effects from anesthesia. The patient agrees and gives consent to proceed.  Please  refer to procedure notes for findings, recommendations and patient disposition/instructions.     Regis Bill, MD [facility]

## 2022-07-17 NOTE — Op Note (Signed)
[facility] Patient Name: Cristina [name] Procedure Date: 07/17/2022 1:40 PM MRN: [id number] Account #: [id number] Date of Birth: 10/10/1960 Admit Type: Outpatient Age: 62 Room: [facility] ENDO ROOM 3 Gender: Female Note Status: Finalized Instrument Name: Upper Endoscope [id number] Procedure:             Upper GI endoscopy Indications:           Polyps in the duodenum Providers:             Eather Colas MD, MD Referring MD:          No Local Md, MD (Referring MD) Medicines:             Monitored Anesthesia Care Complications:         No immediate complications. Estimated blood loss:                         Minimal. Procedure:             Pre-Anesthesia Assessment:                        - Prior to the procedure, a History and Physical was                         performed, and patient medications and allergies were                         reviewed. The patient is competent. The risks and                         benefits of the procedure and the sedation options and                         risks were discussed with the patient. All questions                         were answered and informed consent was obtained.                         Patient identification and proposed procedure were                         verified by the physician, the nurse, the                         anesthesiologist, the anesthetist and the technician                         in the endoscopy suite. Mental Status Examination:                         alert and oriented. Airway Examination: normal                         oropharyngeal airway and neck mobility. Respiratory                         Examination: clear to auscultation. CV Examination:  normal. Prophylactic Antibiotics: The patient does not                         require prophylactic antibiotics. Prior                         Anticoagulants: The patient has taken no anticoagulant                          or antiplatelet agents. ASA Grade Assessment: III - A                         patient with severe systemic disease. After reviewing                         the risks and benefits, the patient was deemed in                         satisfactory condition to undergo the procedure. The                         anesthesia plan was to use monitored anesthesia care                         (MAC). Immediately prior to administration of                         medications, the patient was re-assessed for adequacy                         to receive sedatives. The heart rate, respiratory                         rate, oxygen saturations, blood pressure, adequacy of                         pulmonary ventilation, and response to care were                         monitored throughout the procedure. The physical                         status of the patient was re-assessed after the                         procedure.                        After obtaining informed consent, the endoscope was                         passed under direct vision. Throughout the procedure,                         the patient's blood pressure, pulse, and oxygen                         saturations were monitored continuously. The Endoscope  was introduced through the mouth, and advanced to the                         second part of duodenum. The upper GI endoscopy was                         accomplished without difficulty. The patient tolerated                         the procedure well. Findings:      The examined esophagus was normal.      Patchy moderate inflammation characterized by adherent blood was found       in the entire examined stomach. Biopsies were taken with a cold forceps       for Helicobacter pylori testing. Estimated blood loss was minimal.      A single 5 mm sessile polyp with no bleeding was found in the first       portion of the duodenum. The polyp was removed with a cold snare.        Resection and retrieval were complete. To prevent bleeding       post-intervention, two hemostatic clips were successfully placed. There       was no bleeding during, or at the end, of the procedure. Impression:            - Normal esophagus.                        - Gastritis. Biopsied.                        - A single duodenal polyp. Resected and retrieved.                         Clips were placed. Recommendation:        - Discharge patient to home.                        - Resume previous diet.                        - Continue present medications.                        - Await pathology results.                        - Return to referring physician as previously                         scheduled. Procedure Code(s):     --- Professional ---                        [phone], Esophagogastroduodenoscopy, flexible,                         transoral; with removal of tumor(s), polyp(s), or                         other lesion(s) by snare technique Diagnosis Code(s):     --- Professional ---  K29.70, Gastritis, unspecified, without bleeding                        K31.7, Polyp of stomach and duodenum CPT copyright 2022 [employer]. All rights reserved. The codes documented in this report are preliminary and upon coder review may  be revised to meet current compliance requirements. Eather Colas MD, MD 07/17/2022 2:01:14 PM Number of Addenda: 0 Note Initiated On: 07/17/2022 1:40 PM Estimated Blood Loss:  Estimated blood loss was minimal.      [facility]

## 2022-07-17 NOTE — Anesthesia Preprocedure Evaluation (Signed)
Anesthesia Evaluation  Patient identified by MRN, date of birth, ID band Patient awake    Reviewed: Allergy & Precautions, NPO status , Patient's Chart, lab work & pertinent test results  Airway Mallampati: II  TM Distance: >3 FB Neck ROM: full    Dental  (+) Edentulous Upper, Edentulous Lower   Pulmonary neg pulmonary ROS, shortness of breath, sleep apnea and Continuous Positive Airway Pressure Ventilation , COPD, Current Smoker   Pulmonary exam normal  + decreased breath sounds      Cardiovascular Exercise Tolerance: Poor hypertension, +CHF  negative cardio ROS Normal cardiovascular exam Rhythm:Regular Rate:Normal     Neuro/Psych  PSYCHIATRIC DISORDERS    Schizophrenia  CVA negative neurological ROS  negative psych ROS   GI/Hepatic negative GI ROS, Neg liver ROS,GERD  Medicated,,  Endo/Other  negative endocrine ROSdiabetes, Type 2, Oral Hypoglycemic Agents  Morbid obesity  Renal/GU negative Renal ROS  negative genitourinary   Musculoskeletal  (+) Arthritis ,    Abdominal  (+) + obese  Peds negative pediatric ROS (+)  Hematology negative hematology ROS (+) Blood dyscrasia, anemia   Anesthesia Other Findings Past Medical History: No date: Abdominal hernia No date: Arthritis No date: Asthma No date: Bladder dysfunction No date: Carpal tunnel syndrome No date: CHF (congestive heart failure) (HCC) No date: COPD (chronic obstructive pulmonary disease) (HCC) No date: DDD (degenerative disc disease), lumbar No date: Diabetes mellitus without complication (HCC) No date: Dyspnea No date: Edema No date: Fatty tumor No date: GERD (gastroesophageal reflux disease) No date: H/O colonoscopy No date: H/O tubal ligation     Comment:  hx btl No date: History of cholecystectomy     Comment:  hx of cholecystectomy No date: HLD (hyperlipidemia) No date: Hypertension No date: Muscle cramps No date: Neuropathy No date:  Obesity No date: Recurrent boils No date: Schizoaffective disorder (HCC) No date: Sleep apnea No date: Stroke [facility]) No date: UTI (lower urinary tract infection) No date: Vaginitis No date: Wears dentures     Comment:  full upper and lower  Past Surgical History: 09/24/2014: CARDIAC CATHETERIZATION; Left     Comment:  Procedure: Left Heart Cath and Coronary Angiography;                Surgeon: Laurier Nancy, MD;  Location: [facility] INVASIVE CV               LAB;  Service: Cardiovascular;  Laterality: Left; 05/19/2022: CATARACT EXTRACTION W/PHACO; Left     Comment:  Procedure: CATARACT EXTRACTION PHACO AND INTRAOCULAR               LENS PLACEMENT (IOC) LEFT DIABETIC 12.14 01:05.8;                Surgeon: Galen Manila, MD;  Location: [facility] SURGERY              CNTR;  Service: Ophthalmology;  Laterality: Left;                Diabetic 06/02/2022: CATARACT EXTRACTION W/PHACO; Right     Comment:  Procedure: CATARACT EXTRACTION PHACO AND INTRAOCULAR               LENS PLACEMENT (IOC) RIGHT DIABETIC 9.17 00:59.2;                Surgeon: Galen Manila, MD;  Location: [facility] SURGERY              CNTR;  Service: Ophthalmology;  Laterality: Right;  Diabetic No date: CHOLECYSTECTOMY 08/12/2015: COLONOSCOPY WITH PROPOFOL; N/A     Comment:  Procedure: COLONOSCOPY WITH PROPOFOL;  Surgeon: Christena Deem, MD;  Location: [facility] ENDOSCOPY;  Service:               Endoscopy;  Laterality: N/A; 04/23/2017: COLONOSCOPY WITH PROPOFOL; N/A     Comment:  Procedure: COLONOSCOPY WITH PROPOFOL;  Surgeon: Wyline Mood, MD;  Location: [facility] ENDOSCOPY;  Service:               Endoscopy;  Laterality: N/A; 03/04/2022: COLONOSCOPY WITH PROPOFOL; N/A     Comment:  Procedure: COLONOSCOPY WITH PROPOFOL;  Surgeon: [place],               Boykin Nearing, MD;  Location: [facility] ENDOSCOPY;  Service:               Gastroenterology;  Laterality: N/A; 05/28/2017: ENTEROSCOPY; N/A      Comment:  Procedure: SMALL BOWEL ENTEROSCOPY;  Surgeon: Toney Reil, MD;  Location: [facility] ENDOSCOPY;  Service:               Gastroenterology;  Laterality: N/A; 06/04/2017: ENTEROSCOPY; N/A     Comment:  Procedure: RETROGRADE SMALL BOWEL ENTEROSCOPY;  Surgeon:              Toney Reil, MD;  Location: [facility] ENDOSCOPY;                Service: Gastroenterology;  Laterality: N/A; 08/12/2015: ESOPHAGOGASTRODUODENOSCOPY (EGD) WITH PROPOFOL; N/A     Comment:  Procedure: ESOPHAGOGASTRODUODENOSCOPY (EGD) WITH               PROPOFOL;  Surgeon: Christena Deem, MD;  Location:               [facility] ENDOSCOPY;  Service: Endoscopy;  Laterality: N/A; 03/06/2017: ESOPHAGOGASTRODUODENOSCOPY (EGD) WITH PROPOFOL; N/A     Comment:  Procedure: ESOPHAGOGASTRODUODENOSCOPY (EGD) WITH               PROPOFOL;  Surgeon: Midge Minium, MD;  Location: [facility]               ENDOSCOPY;  Service: Endoscopy;  Laterality: N/A; 03/04/2022: ESOPHAGOGASTRODUODENOSCOPY (EGD) WITH PROPOFOL; N/A     Comment:  Procedure: ESOPHAGOGASTRODUODENOSCOPY (EGD) WITH               PROPOFOL;  Surgeon: Toledo, Boykin Nearing, MD;  Location:               [facility] ENDOSCOPY;  Service: Gastroenterology;  Laterality:               N/A; No date: EYE SURGERY 05/25/2017: GIVENS CAPSULE STUDY; N/A     Comment:  Procedure: GIVENS CAPSULE STUDY;  Surgeon: Midge Minium,              MD;  Location: [facility] ENDOSCOPY;  Service: Endoscopy;                Laterality: N/A; No date: tubal ligation     Reproductive/Obstetrics negative OB ROS                             Anesthesia Physical Anesthesia Plan  ASA: 3  Anesthesia  Plan: General   Post-op Pain Management:    Induction: Intravenous  PONV Risk Score and Plan: Propofol infusion and TIVA  Airway Management Planned: Natural Airway  Additional Equipment:   Intra-op Plan:   Post-operative Plan:   Informed Consent: I have reviewed the patients  History and Physical, chart, labs and discussed the procedure including the risks, benefits and alternatives for the proposed anesthesia with the patient or authorized representative who has indicated his/her understanding and acceptance.     Dental Advisory Given  Plan Discussed with: Anesthesiologist, CRNA and Surgeon  Anesthesia Plan Comments:        Anesthesia Quick Evaluation

## 2022-07-17 NOTE — Interval H&P Note (Signed)
History and Physical Interval Note:  07/17/2022 1:37 PM  Cristina [name]  has presented today for surgery, with the diagnosis of Duodenal Adenoma.  The various methods of treatment have been discussed with the patient and family. After consideration of risks, benefits and other options for treatment, the patient has consented to  Procedure(s): ESOPHAGOGASTRODUODENOSCOPY (EGD) WITH PROPOFOL (N/A) as a surgical intervention.  The patient's history has been reviewed, patient examined, no change in status, stable for surgery.  I have reviewed the patient's chart and labs.  Questions were answered to the patient's satisfaction.     Regis Bill  Ok to proceed with EGD

## 2022-07-17 NOTE — Anesthesia Postprocedure Evaluation (Signed)
Anesthesia Post Note  Patient: Cristina [name]  Procedure(s) Performed: ESOPHAGOGASTRODUODENOSCOPY (EGD) WITH PROPOFOL  Patient location during evaluation: PACU Anesthesia Type: General Level of consciousness: awake Pain management: satisfactory to patient Vital Signs Assessment: post-procedure vital signs reviewed and stable Respiratory status: spontaneous breathing and nonlabored ventilation Cardiovascular status: stable Anesthetic complications: no   No notable events documented.   Last Vitals:  Vitals:   07/17/22 1400 07/17/22 1420  BP: (!) 106/43   Pulse:  88  Resp:  16  Temp: 36.8 C   SpO2:  96%    Last Pain:  Vitals:   07/17/22 1420  TempSrc:   PainSc: 0-No pain                 VAN STAVEREN,Akaiya Touchette

## 2022-07-17 NOTE — Transfer of Care (Signed)
Immediate Anesthesia Transfer of Care Note  Patient: Gracye Ary  Procedure(s) Performed: ESOPHAGOGASTRODUODENOSCOPY (EGD) WITH PROPOFOL  Patient Location: PACU  Anesthesia Type:General  Level of Consciousness: drowsy  Airway & Oxygen Therapy: Patient Spontanous Breathing and Patient connected to nasal cannula oxygen  Post-op Assessment: Report given to RN and Post -op Vital signs reviewed and stable  Post vital signs: stable  Last Vitals:  Vitals Value Taken Time  BP 106/43 07/17/22 1401  Temp 36.8 C 07/17/22 1400  Pulse 79 07/17/22 1405  Resp 25 07/17/22 1405  SpO2 95 % 07/17/22 1405  Vitals shown include unvalidated device data.  Last Pain:  Vitals:   07/17/22 1400  TempSrc: Temporal  PainSc: Asleep         Complications: No notable events documented.

## 2022-07-19 ENCOUNTER — Other Ambulatory Visit: Payer: Self-pay

## 2022-07-19 ENCOUNTER — Emergency Department: Payer: Medicare Other

## 2022-07-19 ENCOUNTER — Emergency Department
Admission: EM | Admit: 2022-07-19 | Discharge: 2022-07-19 | Disposition: A | Discharge status: Home / Self Care | Payer: Medicare Other | Attending: Emergency Medicine | Admitting: Emergency Medicine

## 2022-07-19 DIAGNOSIS — E119 Type 2 diabetes mellitus without complications: Secondary | ICD-10-CM | POA: Diagnosis not present

## 2022-07-19 DIAGNOSIS — M79672 Pain in left foot: Secondary | ICD-10-CM | POA: Diagnosis present

## 2022-07-19 DIAGNOSIS — Z8673 Personal history of transient ischemic attack (TIA), and cerebral infarction without residual deficits: Secondary | ICD-10-CM | POA: Insufficient documentation

## 2022-07-19 DIAGNOSIS — I509 Heart failure, unspecified: Secondary | ICD-10-CM | POA: Diagnosis not present

## 2022-07-19 DIAGNOSIS — L03116 Cellulitis of left lower limb: Secondary | ICD-10-CM | POA: Insufficient documentation

## 2022-07-19 DIAGNOSIS — J449 Chronic obstructive pulmonary disease, unspecified: Secondary | ICD-10-CM | POA: Diagnosis not present

## 2022-07-19 LAB — HEPATIC FUNCTION PANEL
ALT: 18 U/L (ref 0–44)
AST: 26 U/L (ref 15–41)
Albumin: 2.6 g/dL — ABNORMAL LOW (ref 3.5–5.0)
Alkaline Phosphatase: 82 U/L (ref 38–126)
Bilirubin, Direct: 0.1 mg/dL (ref 0.0–0.2)
Indirect Bilirubin: 0.7 mg/dL (ref 0.3–0.9)
Total Bilirubin: 0.8 mg/dL (ref 0.3–1.2)
Total Protein: 6.9 g/dL (ref 6.5–8.1)

## 2022-07-19 LAB — CBC
HCT: 34.8 % — ABNORMAL LOW (ref 36.0–46.0)
Hemoglobin: 11 g/dL — ABNORMAL LOW (ref 12.0–15.0)
MCH: 27.4 pg (ref 26.0–34.0)
MCHC: 31.6 g/dL (ref 30.0–36.0)
MCV: 86.8 fL (ref 80.0–100.0)
Platelets: 249 10*3/uL (ref 150–400)
RBC: 4.01 MIL/uL (ref 3.87–5.11)
RDW: 14.7 % (ref 11.5–15.5)
WBC: 16.3 10*3/uL — ABNORMAL HIGH (ref 4.0–10.5)
nRBC: 0 % (ref 0.0–0.2)

## 2022-07-19 LAB — BASIC METABOLIC PANEL
Anion gap: 7 (ref 5–15)
BUN: 17 mg/dL (ref 8–23)
CO2: 30 mmol/L (ref 22–32)
Calcium: 8.2 mg/dL — ABNORMAL LOW (ref 8.9–10.3)
Chloride: 101 mmol/L (ref 98–111)
Creatinine, Ser: 0.87 mg/dL (ref 0.44–1.00)
GFR, Estimated: >60 mL/min (ref >60)
Glucose, Bld: 160 mg/dL — ABNORMAL HIGH (ref 70–99)
Potassium: 4 mmol/L (ref 3.5–5.1)
Sodium: 138 mmol/L (ref 135–145)

## 2022-07-19 LAB — BRAIN NATRIURETIC PEPTIDE: B Natriuretic Peptide: 66.6 pg/mL (ref 0.0–100.0)

## 2022-07-19 LAB — TROPONIN I (HIGH SENSITIVITY): Troponin I (High Sensitivity): 9 ng/L (ref <18)

## 2022-07-19 LAB — LACTIC ACID, PLASMA: Lactic Acid, Venous: 1 mmol/L (ref 0.5–1.9)

## 2022-07-19 MED ORDER — CEPHALEXIN 500 MG PO CAPS
500.0000 mg | ORAL_CAPSULE | Freq: Once | ORAL | Status: AC
  Administered 2022-07-19: 500 mg via ORAL
  Filled 2022-07-19: qty 1

## 2022-07-19 MED ORDER — CEPHALEXIN 500 MG PO CAPS: 500.0000 mg | ORAL_CAPSULE | Freq: Three times a day (TID) | ORAL | 0 refills | Status: AC

## 2022-07-19 MED ORDER — SULFAMETHOXAZOLE-TRIMETHOPRIM 800-160 MG PO TABS: 1.0000 | ORAL_TABLET | Freq: Two times a day (BID) | ORAL | 0 refills | Status: AC

## 2022-07-19 MED ORDER — SULFAMETHOXAZOLE-TRIMETHOPRIM 800-160 MG PO TABS
1.0000 | ORAL_TABLET | Freq: Once | ORAL | Status: AC
  Administered 2022-07-19: 1 via ORAL
  Filled 2022-07-19: qty 1

## 2022-07-19 NOTE — ED Notes (Signed)
Patient given coke. 

## 2022-07-19 NOTE — ED Provider Notes (Signed)
[facility] Provider Note    Event Date/Time   First MD Initiated Contact with Patient 07/19/22 1508     (approximate)   History   Chief Complaint: Leg Pain   HPI  Cristina [name] is a 62 y.o. female with a history of stroke, COPD, CHF, diabetes who comes ED complaining of bilateral foot pain and redness which has been gradually onset for the past few months but worse in the last few days.  No fever or chills, no chest pain or shortness of breath.  No calf pain.  No wounds or burns.  Denies dyspnea on exertion, orthopnea, or PND.      Physical Exam   Triage Vital Signs: ED Triage Vitals  Enc Vitals Group     BP 07/19/22 1037 (!) 123/96     Pulse Rate 07/19/22 1037 88     Resp 07/19/22 1037 16     Temp 07/19/22 1037 98.7 F (37.1 C)     Temp Source 07/19/22 1037 Oral     SpO2 07/19/22 1037 95 %     Weight --      Height 07/19/22 1040 4\' 11"  (1.499 m)     Head Circumference --      Peak Flow --      Pain Score 07/19/22 1040 8     Pain Loc --      Pain Edu? --      Excl. in GC? --     Most recent vital signs: Vitals:   07/19/22 1352 07/19/22 1554  BP: 107/64 107/78  Pulse: 66 73  Resp: 20 20  Temp: 98 F (36.7 C)   SpO2: 100% 94%    General: Awake, no distress.  CV:  Good peripheral perfusion.  Regular rate and rhythm Resp:  Normal effort.  Clear to auscultation bilaterally Abd:  No distention.  Soft nontender Other:  Trace lower extremity edema bilaterally, calf circumference left greater than right.  No calf tenderness.  There is redness, tenderness, and superficial skin desquamation of the plantar foot bilaterally, left greater than right.  No deep tissue ulceration, no purulent drainage.  No crepitus.   ED Results / Procedures / Treatments   Labs (all labs ordered are listed, but only abnormal results are displayed) Labs Reviewed  BASIC METABOLIC PANEL - Abnormal; Notable for the following components:      Result Value    Glucose, Bld 160 (*)    Calcium 8.2 (*)    All other components within normal limits  CBC - Abnormal; Notable for the following components:   WBC 16.3 (*)    Hemoglobin 11.0 (*)    HCT 34.8 (*)    All other components within normal limits  HEPATIC FUNCTION PANEL - Abnormal; Notable for the following components:   Albumin 2.6 (*)    All other components within normal limits  LACTIC ACID, PLASMA  BRAIN NATRIURETIC PEPTIDE  TROPONIN I (HIGH SENSITIVITY)     EKG    RADIOLOGY Chest x-ray interpreted by me, appears unremarkable.  Radiology report reviewed.  X-ray bilateral feet negative for signs of fracture or osteomyelitis  Ultrasound lower extremity bilateral negative for DVT   PROCEDURES:  Procedures   MEDICATIONS ORDERED IN ED: Medications  cephALEXin (KEFLEX) capsule 500 mg (has no administration in time range)  sulfamethoxazole-trimethoprim (BACTRIM DS) 800-160 MG per tablet 1 tablet (has no administration in time range)     IMPRESSION / MDM / ASSESSMENT AND PLAN / ED COURSE  I reviewed the triage vital signs and the nursing notes.  DDx: Cellulitis, DVT, foot fracture, Charcot foot, electrolyte abnormality, CHF exacerbation  Patient's presentation is most consistent with acute presentation with potential threat to life or bodily function.  Patient presents with bilateral lower extremity swelling and redness, left greater than right.  Most likely cellulitis.  X-rays and ultrasound negative, lab panel negative.  Vital signs normal.  Not septic.  No evidence of septic arthritis, osteomyelitis, or necrotizing fasciitis.  No evidence of abscess.  Patient is nontoxic, feels well.  Feels like she can manage her ADLs and medication management at home and her [facility].  She is drinking coffee and eating a sandwich and stable for discharge.       FINAL CLINICAL IMPRESSION(S) / ED DIAGNOSES   Final diagnoses:  Cellulitis of left foot  Type 2 diabetes  mellitus without complication, without long-term current use of insulin (HCC)     Rx / DC Orders   ED Discharge Orders          Ordered    cephALEXin (KEFLEX) 500 MG capsule  3 times daily        07/19/22 1827    sulfamethoxazole-trimethoprim (BACTRIM DS) 800-160 MG tablet  2 times daily        07/19/22 1827             Note:  This document was prepared using Dragon voice recognition software and may include unintentional dictation errors.   Sharman Cheek, MD 07/19/22 [phone]

## 2022-07-19 NOTE — ED Triage Notes (Signed)
Pt reports bilateral leg redness and pain for the past several months. Pt reports is a diabetic.

## 2022-07-19 NOTE — ED Triage Notes (Signed)
First Nurse Note;  Pt via [facility] from [facility]. Pt c/o bilateral leg pain and redness. Pt has a hx of HTN and CHF. EMS report 95% on RA, placed on 2L [place] for comfort. Pt has an open wound to the sole of the L foot, pt has been scratching them.  94 HR 98% on 2L  120/64 BP  186 CBG  99.6 orally

## 2022-07-19 NOTE — Discharge Instructions (Signed)
Your examination reveals cellulitis of the left foot.  Take Keflex and Bactrim as prescribed to treat this infection.  X-rays of the feet were negative.  Ultrasounds of both legs were negative for blood clots.  Your lab tests were reassuring.  Please follow-up with podiatry for further evaluation.

## 2022-07-20 ENCOUNTER — Encounter: Payer: Self-pay | Admitting: Gastroenterology

## 2022-12-09 ENCOUNTER — Emergency Department: Payer: Medicare Other

## 2022-12-09 ENCOUNTER — Other Ambulatory Visit: Payer: Self-pay

## 2022-12-09 ENCOUNTER — Emergency Department
Admission: EM | Admit: 2022-12-09 | Discharge: 2022-12-09 | Disposition: A | Discharge status: Home / Self Care | Payer: Medicare Other | Attending: Emergency Medicine | Admitting: Emergency Medicine

## 2022-12-09 DIAGNOSIS — E119 Type 2 diabetes mellitus without complications: Secondary | ICD-10-CM | POA: Insufficient documentation

## 2022-12-09 DIAGNOSIS — Y92009 Unspecified place in unspecified non-institutional (private) residence as the place of occurrence of the external cause: Secondary | ICD-10-CM | POA: Insufficient documentation

## 2022-12-09 DIAGNOSIS — I509 Heart failure, unspecified: Secondary | ICD-10-CM | POA: Diagnosis not present

## 2022-12-09 DIAGNOSIS — J449 Chronic obstructive pulmonary disease, unspecified: Secondary | ICD-10-CM | POA: Insufficient documentation

## 2022-12-09 DIAGNOSIS — W19XXXA Unspecified fall, initial encounter: Secondary | ICD-10-CM

## 2022-12-09 DIAGNOSIS — W1839XA Other fall on same level, initial encounter: Secondary | ICD-10-CM | POA: Insufficient documentation

## 2022-12-09 DIAGNOSIS — S8002XA Contusion of left knee, initial encounter: Secondary | ICD-10-CM

## 2022-12-09 DIAGNOSIS — S0990XA Unspecified injury of head, initial encounter: Secondary | ICD-10-CM

## 2022-12-09 DIAGNOSIS — S52502A Unspecified fracture of the lower end of left radius, initial encounter for closed fracture: Secondary | ICD-10-CM | POA: Diagnosis not present

## 2022-12-09 DIAGNOSIS — S62102A Fracture of unspecified carpal bone, left wrist, initial encounter for closed fracture: Secondary | ICD-10-CM

## 2022-12-09 DIAGNOSIS — M25532 Pain in left wrist: Secondary | ICD-10-CM | POA: Diagnosis present

## 2022-12-09 LAB — BASIC METABOLIC PANEL
Anion gap: 9 (ref 5–15)
BUN: 14 mg/dL (ref 8–23)
CO2: 29 mmol/L (ref 22–32)
Calcium: 8.5 mg/dL — ABNORMAL LOW (ref 8.9–10.3)
Chloride: 99 mmol/L (ref 98–111)
Creatinine, Ser: 0.86 mg/dL (ref 0.44–1.00)
GFR, Estimated: >60 mL/min (ref >60)
Glucose, Bld: 175 mg/dL — ABNORMAL HIGH (ref 70–99)
Potassium: 3.7 mmol/L (ref 3.5–5.1)
Sodium: 137 mmol/L (ref 135–145)

## 2022-12-09 LAB — CBC WITH DIFFERENTIAL/PLATELET
Abs Immature Granulocytes: 0.01 10*3/uL (ref 0.00–0.07)
Basophils Absolute: 0.1 10*3/uL (ref 0.0–0.1)
Basophils Relative: 1 %
Eosinophils Absolute: 0.2 10*3/uL (ref 0.0–0.5)
Eosinophils Relative: 2 %
HCT: 36.5 % (ref 36.0–46.0)
Hemoglobin: 11.6 g/dL — ABNORMAL LOW (ref 12.0–15.0)
Immature Granulocytes: 0 %
Lymphocytes Relative: 21 %
Lymphs Abs: 1.4 10*3/uL (ref 0.7–4.0)
MCH: 27.8 pg (ref 26.0–34.0)
MCHC: 31.8 g/dL (ref 30.0–36.0)
MCV: 87.3 fL (ref 80.0–100.0)
Monocytes Absolute: 0.7 10*3/uL (ref 0.1–1.0)
Monocytes Relative: 11 %
Neutro Abs: 4.4 10*3/uL (ref 1.7–7.7)
Neutrophils Relative %: 65 %
Platelets: 163 10*3/uL (ref 150–400)
RBC: 4.18 MIL/uL (ref 3.87–5.11)
RDW: 14.6 % (ref 11.5–15.5)
WBC: 6.8 10*3/uL (ref 4.0–10.5)
nRBC: 0 % (ref 0.0–0.2)

## 2022-12-09 LAB — TROPONIN I (HIGH SENSITIVITY)
Troponin I (High Sensitivity): 3 ng/L (ref <18)
Troponin I (High Sensitivity): 3 ng/L (ref <18)

## 2022-12-09 MED ORDER — TRAMADOL HCL 50 MG PO TABS: 50.0000 mg | ORAL_TABLET | Freq: Four times a day (QID) | ORAL | 0 refills | Status: DC | PRN

## 2022-12-09 MED ORDER — TRAMADOL HCL 50 MG PO TABS
50.0000 mg | ORAL_TABLET | Freq: Once | ORAL | Status: AC
  Administered 2022-12-09: 50 mg via ORAL
  Filled 2022-12-09: qty 1

## 2022-12-09 NOTE — ED Triage Notes (Signed)
Pt states she fell this morning at home, pt lives at [facility], pt states she became light headed felt like she "blacked out" and then fell hitting her head on a rocking chair. Pt denies blood thinner use. Pt c/o pain to left arm hip knee and ankle

## 2022-12-09 NOTE — ED Provider Notes (Signed)
[facility] Provider Note    Event Date/Time   First MD Initiated Contact with Patient 12/09/22 2104     (approximate)   History   Fall   HPI  Cristina [name] is a 61 y.o. female with history of diabetes, COPD, CHF who presents after a fall to her left side, she think she lost her balance.  She complains of pain in her left knee, left elbow, left wrist and that she may have hit her head.  No back pain     Physical Exam   Triage Vital Signs: ED Triage Vitals [12/09/22 1924]  Encounter Vitals Group     BP 134/72     Systolic BP Percentile      Diastolic BP Percentile      Pulse Rate 75     Resp 18     Temp 97.9 F (36.6 C)     Temp Source Oral     SpO2 97 %     Weight 102.5 kg (226 lb)     Height 1.448 m (4\' 9" )     Head Circumference      Peak Flow      Pain Score 10     Pain Loc      Pain Education      Exclude from Growth Chart     Most recent vital signs: Vitals:   12/09/22 1924 12/09/22 2147  BP: 134/72 132/65  Pulse: 75 66  Resp: 18 18  Temp: 97.9 F (36.6 C) 98.2 F (36.8 C)  SpO2: 97% 95%     General: Awake, no distress.  No hematoma or laceration to the scalp CV:  Good peripheral perfusion.  No chest wall tenderness palpation Resp:  Normal effort.  Abd:  No distention.  Soft, nontender Other:  No vertebral tenderness palpation.  Normal range of motion of the legs bilaterally.  No pain with axial load on both hips.  Mild swelling and tenderness of the left wrist and left elbow otherwise reassuring exam   ED Results / Procedures / Treatments   Labs (all labs ordered are listed, but only abnormal results are displayed) Labs Reviewed  CBC WITH DIFFERENTIAL/PLATELET - Abnormal; Notable for the following components:      Result Value   Hemoglobin 11.6 (*)    All other components within normal limits  BASIC METABOLIC PANEL - Abnormal; Notable for the following components:   Glucose, Bld 175 (*)    Calcium 8.5 (*)    All  other components within normal limits  URINALYSIS, ROUTINE W REFLEX MICROSCOPIC  TROPONIN I (HIGH SENSITIVITY)  TROPONIN I (HIGH SENSITIVITY)     EKG  ED ECG REPORT I, Jene Every, the attending physician, personally viewed and interpreted this ECG.  Date: 12/09/2022  Rhythm: normal sinus rhythm QRS Axis: normal Intervals: normal ST/T Wave abnormalities: normal Narrative Interpretation: no evidence of acute ischemia    RADIOLOGY Wrist x-ray viewed interpret by me, radial fracture    PROCEDURES:  Critical Care performed: Splint applied to the left wrist, radial gutter  Procedures   MEDICATIONS ORDERED IN ED: Medications  traMADol (ULTRAM) tablet 50 mg (50 mg Oral Given 12/09/22 2148)     IMPRESSION / MDM / ASSESSMENT AND PLAN / ED COURSE  I reviewed the triage vital signs and the nursing notes. Patient's presentation is most consistent with acute presentation with potential threat to life or bodily function.  Patient presents after a fall with injuries as described.  Differential  includes contusion, fracture, ICH, concussion  Imaging is overall reassuring, she does appear to have contusion to the left elbow and left knee.  As well as a radial head fracture on the left, this was splinted, analgesics provided, recommend orthopedic follow-up.        FINAL CLINICAL IMPRESSION(S) / ED DIAGNOSES   Final diagnoses:  Fall, initial encounter  Injury of head, initial encounter  Closed fracture of left wrist, initial encounter  Contusion of left knee, initial encounter     Rx / DC Orders   ED Discharge Orders          Ordered    traMADol (ULTRAM) 50 MG tablet  Every 6 hours PRN        12/09/22 2154             Note:  This document was prepared using Dragon voice recognition software and may include unintentional dictation errors.   Jene Every, MD 12/09/22 2241

## 2022-12-10 ENCOUNTER — Telehealth: Payer: Self-pay | Admitting: Emergency Medicine

## 2022-12-10 MED ORDER — TRAMADOL HCL 50 MG PO TABS: 50.0000 mg | ORAL_TABLET | Freq: Four times a day (QID) | ORAL | 0 refills | Status: AC | PRN

## 2022-12-10 NOTE — Telephone Encounter (Signed)
Pharmacy changed per pt's care facility.

## 2023-02-25 ENCOUNTER — Other Ambulatory Visit: Payer: Self-pay

## 2023-02-25 ENCOUNTER — Inpatient Hospital Stay
Admission: EM | Admit: 2023-02-25 | Discharge: 2023-03-02 | DRG: 871 | Disposition: A | Discharge status: Home Health | Payer: Medicare Other | Source: Skilled Nursing Facility | Attending: Internal Medicine | Admitting: Internal Medicine

## 2023-02-25 ENCOUNTER — Encounter: Payer: Self-pay | Admitting: Emergency Medicine

## 2023-02-25 DIAGNOSIS — Z7982 Long term (current) use of aspirin: Secondary | ICD-10-CM

## 2023-02-25 DIAGNOSIS — N179 Acute kidney failure, unspecified: Secondary | ICD-10-CM | POA: Diagnosis not present

## 2023-02-25 DIAGNOSIS — E119 Type 2 diabetes mellitus without complications: Secondary | ICD-10-CM

## 2023-02-25 DIAGNOSIS — F32A Depression, unspecified: Secondary | ICD-10-CM | POA: Diagnosis present

## 2023-02-25 DIAGNOSIS — J4489 Other specified chronic obstructive pulmonary disease: Secondary | ICD-10-CM | POA: Diagnosis present

## 2023-02-25 DIAGNOSIS — Z9841 Cataract extraction status, right eye: Secondary | ICD-10-CM

## 2023-02-25 DIAGNOSIS — Z885 Allergy status to narcotic agent status: Secondary | ICD-10-CM

## 2023-02-25 DIAGNOSIS — J449 Chronic obstructive pulmonary disease, unspecified: Secondary | ICD-10-CM | POA: Diagnosis present

## 2023-02-25 DIAGNOSIS — R6520 Severe sepsis without septic shock: Secondary | ICD-10-CM | POA: Diagnosis present

## 2023-02-25 DIAGNOSIS — I110 Hypertensive heart disease with heart failure: Secondary | ICD-10-CM | POA: Diagnosis present

## 2023-02-25 DIAGNOSIS — N390 Urinary tract infection, site not specified: Secondary | ICD-10-CM | POA: Diagnosis present

## 2023-02-25 DIAGNOSIS — E785 Hyperlipidemia, unspecified: Secondary | ICD-10-CM | POA: Diagnosis present

## 2023-02-25 DIAGNOSIS — Z9851 Tubal ligation status: Secondary | ICD-10-CM

## 2023-02-25 DIAGNOSIS — M51369 Other intervertebral disc degeneration, lumbar region without mention of lumbar back pain or lower extremity pain: Secondary | ICD-10-CM | POA: Diagnosis present

## 2023-02-25 DIAGNOSIS — Z87892 Personal history of anaphylaxis: Secondary | ICD-10-CM

## 2023-02-25 DIAGNOSIS — B9620 Unspecified Escherichia coli [E. coli] as the cause of diseases classified elsewhere: Secondary | ICD-10-CM | POA: Diagnosis present

## 2023-02-25 DIAGNOSIS — E861 Hypovolemia: Secondary | ICD-10-CM | POA: Diagnosis present

## 2023-02-25 DIAGNOSIS — E872 Acidosis, unspecified: Secondary | ICD-10-CM | POA: Diagnosis present

## 2023-02-25 DIAGNOSIS — G4733 Obstructive sleep apnea (adult) (pediatric): Secondary | ICD-10-CM | POA: Diagnosis present

## 2023-02-25 DIAGNOSIS — G629 Polyneuropathy, unspecified: Secondary | ICD-10-CM

## 2023-02-25 DIAGNOSIS — I10 Essential (primary) hypertension: Secondary | ICD-10-CM | POA: Diagnosis present

## 2023-02-25 DIAGNOSIS — Z801 Family history of malignant neoplasm of trachea, bronchus and lung: Secondary | ICD-10-CM

## 2023-02-25 DIAGNOSIS — Z961 Presence of intraocular lens: Secondary | ICD-10-CM | POA: Diagnosis present

## 2023-02-25 DIAGNOSIS — Z1623 Resistance to quinolones and fluoroquinolones: Secondary | ICD-10-CM | POA: Diagnosis present

## 2023-02-25 DIAGNOSIS — E11621 Type 2 diabetes mellitus with foot ulcer: Secondary | ICD-10-CM | POA: Diagnosis present

## 2023-02-25 DIAGNOSIS — F17210 Nicotine dependence, cigarettes, uncomplicated: Secondary | ICD-10-CM | POA: Diagnosis present

## 2023-02-25 DIAGNOSIS — E871 Hypo-osmolality and hyponatremia: Secondary | ICD-10-CM | POA: Diagnosis present

## 2023-02-25 DIAGNOSIS — I5032 Chronic diastolic (congestive) heart failure: Secondary | ICD-10-CM | POA: Diagnosis present

## 2023-02-25 DIAGNOSIS — A419 Sepsis, unspecified organism: Principal | ICD-10-CM | POA: Diagnosis present

## 2023-02-25 DIAGNOSIS — F259 Schizoaffective disorder, unspecified: Secondary | ICD-10-CM | POA: Diagnosis present

## 2023-02-25 DIAGNOSIS — Z9049 Acquired absence of other specified parts of digestive tract: Secondary | ICD-10-CM

## 2023-02-25 DIAGNOSIS — E878 Other disorders of electrolyte and fluid balance, not elsewhere classified: Secondary | ICD-10-CM | POA: Diagnosis present

## 2023-02-25 DIAGNOSIS — Z8249 Family history of ischemic heart disease and other diseases of the circulatory system: Secondary | ICD-10-CM

## 2023-02-25 DIAGNOSIS — L03116 Cellulitis of left lower limb: Secondary | ICD-10-CM | POA: Diagnosis present

## 2023-02-25 DIAGNOSIS — U071 COVID-19: Secondary | ICD-10-CM | POA: Diagnosis present

## 2023-02-25 DIAGNOSIS — G4730 Sleep apnea, unspecified: Secondary | ICD-10-CM | POA: Diagnosis present

## 2023-02-25 DIAGNOSIS — Z789 Other specified health status: Secondary | ICD-10-CM

## 2023-02-25 DIAGNOSIS — R531 Weakness: Secondary | ICD-10-CM

## 2023-02-25 DIAGNOSIS — L03115 Cellulitis of right lower limb: Secondary | ICD-10-CM | POA: Diagnosis present

## 2023-02-25 DIAGNOSIS — Z888 Allergy status to other drugs, medicaments and biological substances status: Secondary | ICD-10-CM

## 2023-02-25 DIAGNOSIS — Z79899 Other long term (current) drug therapy: Secondary | ICD-10-CM

## 2023-02-25 DIAGNOSIS — E876 Hypokalemia: Secondary | ICD-10-CM | POA: Diagnosis not present

## 2023-02-25 DIAGNOSIS — E1165 Type 2 diabetes mellitus with hyperglycemia: Secondary | ICD-10-CM | POA: Diagnosis present

## 2023-02-25 DIAGNOSIS — E1140 Type 2 diabetes mellitus with diabetic neuropathy, unspecified: Secondary | ICD-10-CM | POA: Diagnosis present

## 2023-02-25 DIAGNOSIS — Z9842 Cataract extraction status, left eye: Secondary | ICD-10-CM

## 2023-02-25 DIAGNOSIS — Z7984 Long term (current) use of oral hypoglycemic drugs: Secondary | ICD-10-CM

## 2023-02-25 DIAGNOSIS — Z818 Family history of other mental and behavioral disorders: Secondary | ICD-10-CM

## 2023-02-25 DIAGNOSIS — Z91013 Allergy to seafood: Secondary | ICD-10-CM

## 2023-02-25 DIAGNOSIS — Z6841 Body Mass Index (BMI) 40.0 and over, adult: Secondary | ICD-10-CM | POA: Diagnosis not present

## 2023-02-25 DIAGNOSIS — J9611 Chronic respiratory failure with hypoxia: Secondary | ICD-10-CM | POA: Diagnosis present

## 2023-02-25 DIAGNOSIS — Z8673 Personal history of transient ischemic attack (TIA), and cerebral infarction without residual deficits: Secondary | ICD-10-CM

## 2023-02-25 DIAGNOSIS — L97421 Non-pressure chronic ulcer of left heel and midfoot limited to breakdown of skin: Secondary | ICD-10-CM | POA: Diagnosis present

## 2023-02-25 DIAGNOSIS — L0390 Cellulitis, unspecified: Secondary | ICD-10-CM | POA: Diagnosis not present

## 2023-02-25 DIAGNOSIS — K219 Gastro-esophageal reflux disease without esophagitis: Secondary | ICD-10-CM | POA: Diagnosis present

## 2023-02-25 DIAGNOSIS — Z9889 Other specified postprocedural states: Secondary | ICD-10-CM

## 2023-02-25 DIAGNOSIS — F419 Anxiety disorder, unspecified: Secondary | ICD-10-CM | POA: Diagnosis present

## 2023-02-25 HISTORY — DX: Sepsis, unspecified organism: A41.9

## 2023-02-25 LAB — COMPREHENSIVE METABOLIC PANEL
ALT: 24 U/L (ref 0–44)
AST: 36 U/L (ref 15–41)
Albumin: 3.5 g/dL (ref 3.5–5.0)
Alkaline Phosphatase: 113 U/L (ref 38–126)
Anion gap: 11 (ref 5–15)
BUN: 19 mg/dL (ref 8–23)
CO2: 26 mmol/L (ref 22–32)
Calcium: 8.9 mg/dL (ref 8.9–10.3)
Chloride: 96 mmol/L — ABNORMAL LOW (ref 98–111)
Creatinine, Ser: 1.08 mg/dL — ABNORMAL HIGH (ref 0.44–1.00)
GFR, Estimated: 57 mL/min — ABNORMAL LOW (ref >60)
Glucose, Bld: 216 mg/dL — ABNORMAL HIGH (ref 70–99)
Potassium: 4.3 mmol/L (ref 3.5–5.1)
Sodium: 133 mmol/L — ABNORMAL LOW (ref 135–145)
Total Bilirubin: 0.6 mg/dL (ref 0.0–1.2)
Total Protein: 6.9 g/dL (ref 6.5–8.1)

## 2023-02-25 LAB — CBC WITH DIFFERENTIAL/PLATELET
Abs Immature Granulocytes: 0.07 10*3/uL (ref 0.00–0.07)
Basophils Absolute: 0.1 10*3/uL (ref 0.0–0.1)
Basophils Relative: 0 %
Eosinophils Absolute: 0 10*3/uL (ref 0.0–0.5)
Eosinophils Relative: 0 %
HCT: 37.2 % (ref 36.0–46.0)
Hemoglobin: 12.2 g/dL (ref 12.0–15.0)
Immature Granulocytes: 1 %
Lymphocytes Relative: 2 %
Lymphs Abs: 0.3 10*3/uL — ABNORMAL LOW (ref 0.7–4.0)
MCH: 27.8 pg (ref 26.0–34.0)
MCHC: 32.8 g/dL (ref 30.0–36.0)
MCV: 84.7 fL (ref 80.0–100.0)
Monocytes Absolute: 1 10*3/uL (ref 0.1–1.0)
Monocytes Relative: 8 %
Neutro Abs: 11.8 10*3/uL — ABNORMAL HIGH (ref 1.7–7.7)
Neutrophils Relative %: 89 %
Platelets: 181 10*3/uL (ref 150–400)
RBC: 4.39 MIL/uL (ref 3.87–5.11)
RDW: 14.7 % (ref 11.5–15.5)
WBC: 13.2 10*3/uL — ABNORMAL HIGH (ref 4.0–10.5)
nRBC: 0 % (ref 0.0–0.2)

## 2023-02-25 LAB — URINALYSIS, ROUTINE W REFLEX MICROSCOPIC
Bilirubin Urine: NEGATIVE
Glucose, UA: NEGATIVE mg/dL
Hgb urine dipstick: NEGATIVE
Ketones, ur: NEGATIVE mg/dL
Nitrite: NEGATIVE
Protein, ur: NEGATIVE mg/dL
Specific Gravity, Urine: 1.012 (ref 1.005–1.030)
pH: 5 (ref 5.0–8.0)

## 2023-02-25 LAB — RESP PANEL BY RT-PCR (RSV, FLU A&B, COVID)  RVPGX2
Influenza A by PCR: NEGATIVE
Influenza B by PCR: NEGATIVE
Resp Syncytial Virus by PCR: NEGATIVE
SARS Coronavirus 2 by RT PCR: POSITIVE — AB

## 2023-02-25 LAB — LACTIC ACID, PLASMA
Lactic Acid, Venous: 3 mmol/L (ref 0.5–1.9)
Lactic Acid, Venous: 2 mmol/L (ref 0.5–1.9)

## 2023-02-25 LAB — CBG MONITORING, ED: Glucose-Capillary: 172 mg/dL — ABNORMAL HIGH (ref 70–99)

## 2023-02-25 MED ORDER — SODIUM CHLORIDE 0.9 % IV SOLN
2.0000 g | Freq: Once | INTRAVENOUS | Status: AC
  Administered 2023-02-25: 2 g via INTRAVENOUS
  Filled 2023-02-25: qty 20

## 2023-02-25 MED ORDER — NITROGLYCERIN 0.4 MG SL SUBL: 0.4000 mg | SUBLINGUAL_TABLET | SUBLINGUAL | Status: DC | PRN

## 2023-02-25 MED ORDER — FLUTICASONE FUROATE-VILANTEROL 100-25 MCG/ACT IN AEPB
1.0000 | INHALATION_SPRAY | Freq: Every day | RESPIRATORY_TRACT | Status: AC
Start: 2023-02-26 — End: ?
  Administered 2023-02-28 – 2023-03-02 (×3): 1 via RESPIRATORY_TRACT
  Filled 2023-02-25 (×2): qty 28

## 2023-02-25 MED ORDER — OXYBUTYNIN CHLORIDE ER 5 MG PO TB24
10.0000 mg | ORAL_TABLET | Freq: Every day | ORAL | Status: DC
  Administered 2023-02-26 – 2023-03-02 (×5): 10 mg via ORAL
  Filled 2023-02-25: qty 2
  Filled 2023-02-25: qty 1
  Filled 2023-02-25 (×3): qty 2

## 2023-02-25 MED ORDER — SODIUM CHLORIDE 0.9 % IV SOLN
2.0000 g | INTRAVENOUS | Status: AC
  Administered 2023-02-26 – 2023-02-28 (×3): 2 g via INTRAVENOUS
  Filled 2023-02-25 (×3): qty 20

## 2023-02-25 MED ORDER — BUSPIRONE HCL 5 MG PO TABS
15.0000 mg | ORAL_TABLET | Freq: Two times a day (BID) | ORAL | Status: DC
  Administered 2023-02-26 – 2023-03-02 (×10): 15 mg via ORAL
  Filled 2023-02-25 (×10): qty 3

## 2023-02-25 MED ORDER — ONDANSETRON HCL 4 MG PO TABS: 4.0000 mg | ORAL_TABLET | Freq: Four times a day (QID) | ORAL | Status: DC | PRN

## 2023-02-25 MED ORDER — VANCOMYCIN HCL IN DEXTROSE 1-5 GM/200ML-% IV SOLN
1000.0000 mg | Freq: Once | INTRAVENOUS | Status: AC
  Administered 2023-02-25: 1000 mg via INTRAVENOUS
  Filled 2023-02-25: qty 200

## 2023-02-25 MED ORDER — HALOPERIDOL DECANOATE 100 MG/ML IM SOLN: 100.0000 mg | INTRAMUSCULAR | Status: DC

## 2023-02-25 MED ORDER — METOPROLOL TARTRATE 25 MG PO TABS: 12.5000 mg | ORAL_TABLET | Freq: Two times a day (BID) | ORAL | Status: DC

## 2023-02-25 MED ORDER — ATORVASTATIN CALCIUM 20 MG PO TABS
20.0000 mg | ORAL_TABLET | Freq: Every day | ORAL | Status: DC
  Administered 2023-02-26 – 2023-03-01 (×5): 20 mg via ORAL
  Filled 2023-02-25 (×4): qty 1

## 2023-02-25 MED ORDER — INSULIN ASPART 100 UNIT/ML IJ SOLN
0.0000 [IU] | Freq: Three times a day (TID) | INTRAMUSCULAR | Status: DC
  Administered 2023-02-26: 2 [IU] via SUBCUTANEOUS
  Administered 2023-02-26 (×2): 3 [IU] via SUBCUTANEOUS
  Administered 2023-02-27: 2 [IU] via SUBCUTANEOUS
  Administered 2023-02-27: 3 [IU] via SUBCUTANEOUS
  Administered 2023-02-27 – 2023-03-01 (×3): 5 [IU] via SUBCUTANEOUS
  Administered 2023-03-01: 2 [IU] via SUBCUTANEOUS
  Administered 2023-03-01: 3 [IU] via SUBCUTANEOUS
  Administered 2023-03-02: 5 [IU] via SUBCUTANEOUS
  Administered 2023-03-02: 3 [IU] via SUBCUTANEOUS
  Filled 2023-02-25 (×14): qty 1

## 2023-02-25 MED ORDER — LINAGLIPTIN 5 MG PO TABS
5.0000 mg | ORAL_TABLET | Freq: Every day | ORAL | Status: DC
  Administered 2023-02-26 – 2023-03-02 (×5): 5 mg via ORAL
  Filled 2023-02-25 (×5): qty 1

## 2023-02-25 MED ORDER — DICYCLOMINE HCL 20 MG PO TABS
20.0000 mg | ORAL_TABLET | Freq: Four times a day (QID) | ORAL | Status: DC
  Administered 2023-02-26 – 2023-03-02 (×16): 20 mg via ORAL
  Filled 2023-02-25 (×21): qty 1

## 2023-02-25 MED ORDER — FAMOTIDINE 20 MG PO TABS
20.0000 mg | ORAL_TABLET | Freq: Every day | ORAL | Status: DC
Start: 2023-02-26 — End: 2023-03-02
  Administered 2023-02-26 – 2023-03-02 (×5): 20 mg via ORAL
  Filled 2023-02-25 (×5): qty 1

## 2023-02-25 MED ORDER — ALBUTEROL SULFATE HFA 108 (90 BASE) MCG/ACT IN AERS: 2.0000 | INHALATION_SPRAY | Freq: Four times a day (QID) | RESPIRATORY_TRACT | Status: DC | PRN

## 2023-02-25 MED ORDER — LISINOPRIL 5 MG PO TABS
5.0000 mg | ORAL_TABLET | Freq: Every day | ORAL | Status: DC
  Administered 2023-02-28 – 2023-03-02 (×2): 5 mg via ORAL
  Filled 2023-02-25 (×5): qty 1

## 2023-02-25 MED ORDER — IBUPROFEN 800 MG PO TABS
800.0000 mg | ORAL_TABLET | Freq: Once | ORAL | Status: AC
  Administered 2023-02-25: 800 mg via ORAL
  Filled 2023-02-25: qty 1

## 2023-02-25 MED ORDER — ACETAMINOPHEN 650 MG RE SUPP: 650.0000 mg | Freq: Four times a day (QID) | RECTAL | Status: DC | PRN

## 2023-02-25 MED ORDER — VENLAFAXINE HCL ER 75 MG PO CP24
225.0000 mg | ORAL_CAPSULE | Freq: Every day | ORAL | Status: DC
  Administered 2023-02-26 – 2023-03-02 (×5): 225 mg via ORAL
  Filled 2023-02-25 (×5): qty 3

## 2023-02-25 MED ORDER — PREGABALIN 50 MG PO CAPS
100.0000 mg | ORAL_CAPSULE | Freq: Three times a day (TID) | ORAL | Status: DC
  Administered 2023-02-26 – 2023-03-02 (×13): 100 mg via ORAL
  Filled 2023-02-25 (×13): qty 2

## 2023-02-25 MED ORDER — VANCOMYCIN HCL 1250 MG/250ML IV SOLN
1250.0000 mg | Freq: Once | INTRAVENOUS | Status: AC
  Administered 2023-02-25: 1250 mg via INTRAVENOUS
  Filled 2023-02-25: qty 250

## 2023-02-25 MED ORDER — INSULIN ASPART 100 UNIT/ML IJ SOLN: 0.0000 [IU] | Freq: Three times a day (TID) | INTRAMUSCULAR | Status: DC

## 2023-02-25 MED ORDER — VANCOMYCIN HCL 750 MG/150ML IV SOLN
750.0000 mg | INTRAVENOUS | Status: DC
  Filled 2023-02-25: qty 150

## 2023-02-25 MED ORDER — INSULIN ASPART 100 UNIT/ML IJ SOLN
0.0000 [IU] | Freq: Every day | INTRAMUSCULAR | Status: DC
  Administered 2023-02-25: 3 [IU] via SUBCUTANEOUS
  Filled 2023-02-25: qty 1

## 2023-02-25 MED ORDER — TRAMADOL HCL 50 MG PO TABS
50.0000 mg | ORAL_TABLET | Freq: Four times a day (QID) | ORAL | Status: DC | PRN
  Administered 2023-02-26 – 2023-03-01 (×4): 50 mg via ORAL
  Filled 2023-02-25 (×4): qty 1

## 2023-02-25 MED ORDER — ENOXAPARIN SODIUM 40 MG/0.4ML IJ SOSY
40.0000 mg | PREFILLED_SYRINGE | INTRAMUSCULAR | Status: DC
  Administered 2023-02-26 – 2023-03-01 (×4): 40 mg via SUBCUTANEOUS
  Filled 2023-02-25 (×4): qty 0.4

## 2023-02-25 MED ORDER — FUROSEMIDE 40 MG PO TABS
40.0000 mg | ORAL_TABLET | Freq: Every day | ORAL | Status: DC
  Administered 2023-02-26: 40 mg via ORAL
  Filled 2023-02-25: qty 1

## 2023-02-25 MED ORDER — VANCOMYCIN HCL IN DEXTROSE 1-5 GM/200ML-% IV SOLN: 1000.0000 mg | Freq: Once | INTRAVENOUS | Status: DC

## 2023-02-25 MED ORDER — ONDANSETRON HCL 4 MG/2ML IJ SOLN: 4.0000 mg | Freq: Four times a day (QID) | INTRAMUSCULAR | Status: DC | PRN

## 2023-02-25 MED ORDER — POTASSIUM CHLORIDE CRYS ER 10 MEQ PO TBCR
10.0000 meq | EXTENDED_RELEASE_TABLET | Freq: Every day | ORAL | Status: DC
Start: 2023-02-26 — End: 2023-03-02
  Administered 2023-02-26 – 2023-03-02 (×5): 10 meq via ORAL
  Filled 2023-02-25 (×5): qty 1

## 2023-02-25 MED ORDER — CLONAZEPAM 1 MG PO TABS
1.0000 mg | ORAL_TABLET | Freq: Every day | ORAL | Status: DC
  Administered 2023-02-26 – 2023-03-01 (×5): 1 mg via ORAL
  Filled 2023-02-25 (×2): qty 1
  Filled 2023-02-25 (×2): qty 2
  Filled 2023-02-25: qty 1

## 2023-02-25 MED ORDER — ASPIRIN 81 MG PO TBEC
81.0000 mg | DELAYED_RELEASE_TABLET | Freq: Every day | ORAL | Status: DC
Start: 2023-02-26 — End: 2023-03-02
  Administered 2023-02-26 – 2023-03-02 (×5): 81 mg via ORAL
  Filled 2023-02-25 (×5): qty 1

## 2023-02-25 MED ORDER — LOPERAMIDE HCL 2 MG PO CAPS
2.0000 mg | ORAL_CAPSULE | Freq: Four times a day (QID) | ORAL | Status: DC | PRN
  Administered 2023-02-26: 2 mg via ORAL
  Filled 2023-02-25: qty 1

## 2023-02-25 MED ORDER — LACTATED RINGERS IV SOLN
150.0000 mL/h | INTRAVENOUS | Status: AC
  Administered 2023-02-26 (×2): 150 mL/h via INTRAVENOUS

## 2023-02-25 MED ORDER — TRAZODONE HCL 50 MG PO TABS
25.0000 mg | ORAL_TABLET | Freq: Every evening | ORAL | Status: DC | PRN
  Administered 2023-02-26 – 2023-02-27 (×2): 25 mg via ORAL
  Filled 2023-02-25 (×2): qty 1

## 2023-02-25 MED ORDER — CLONAZEPAM 0.5 MG PO TABS: 0.5000 mg | ORAL_TABLET | Freq: Every day | ORAL | Status: DC

## 2023-02-25 MED ORDER — ACETAMINOPHEN 325 MG PO TABS
650.0000 mg | ORAL_TABLET | Freq: Four times a day (QID) | ORAL | Status: DC | PRN
  Administered 2023-02-26 – 2023-02-28 (×3): 650 mg via ORAL
  Filled 2023-02-25 (×3): qty 2

## 2023-02-25 MED ORDER — GLIPIZIDE 5 MG PO TABS
7.5000 mg | ORAL_TABLET | Freq: Two times a day (BID) | ORAL | Status: DC
  Administered 2023-02-26 – 2023-02-28 (×6): 7.5 mg via ORAL
  Filled 2023-02-25 (×7): qty 1.5

## 2023-02-25 MED ORDER — MAGNESIUM HYDROXIDE 400 MG/5ML PO SUSP: 30.0000 mL | Freq: Every day | ORAL | Status: DC | PRN

## 2023-02-25 NOTE — Consult Note (Signed)
 CODE SEPSIS - PHARMACY COMMUNICATION  **Broad-spectrum antimicrobials should be administered within one hour of sepsis diagnosis**  Time Code Sepsis call or page was received: 2014  Antibiotics ordered: Ceftriaxone , Vancomycin   Time of first antibiotic administration: 2048  Additional action taken by pharmacy: N/A  If necessary, name of provider/nurse contacted: N/A    Will M. Lenon, PharmD Clinical Pharmacist 02/25/2023 8:17 PM

## 2023-02-25 NOTE — Sepsis Progress Note (Signed)
 Elink monitoring for the code sepsis protocol.

## 2023-02-25 NOTE — ED Provider Triage Note (Signed)
 Emergency Medicine Provider Triage Evaluation Note  Cristina [name] , a 63 y.o. female  was evaluated in triage.  Pt complains of fever, dry cough,, sore throat.  Patient lives in a facility,  diagnosed with COVID on Monday  Review of Systems  Positive:  Negative:   Physical Exam  BP 106/63 (BP Location: Left Arm)   Pulse (!) 101   Temp (!) 102.9 F (39.4 C) (Oral)   Resp 18   Ht 4' 9 (1.448 m)   Wt 99.8 kg   SpO2 94%   BMI 47.61 kg/m  Gen:   Awake, no distress , no respiratory distress Resp:  Normal effort, no wheezing no crackles MSK:   Moves extremities without difficulty  Other:    Medical Decision Making  Medically screening exam initiated at 5:14 PM.  Appropriate orders placed.  Tiffeny Minchew was informed that the remainder of the evaluation will be completed by another provider, this initial triage assessment does not replace that evaluation, and the importance of remaining in the ED until their evaluation is complete.  Patient COVID-positive will have CBC CMP    Janit Kast, PA-C 02/25/23 1717

## 2023-02-25 NOTE — ED Notes (Signed)
 See triage note  Present via EMS from [facility] with fever  States she was dx'd with COVID this week

## 2023-02-25 NOTE — Consult Note (Addendum)
 Pharmacy Antibiotic Note  ASSESSMENT: 63 y.o. female with PMH including obesity, diabetes, HTN, rUTI, arthritis is presenting with cellulitis. Pt with mild fever of 100, tachycardia, and leukocytosis. Pharmacy has been consulted to manage vancomycin  dosing.  She was diagnosed with COVID on 02/22/2023.  Patient measurements: Height: 4' 9 (144.8 cm) Weight: 99.8 kg (220 lb) IBW/kg (Calculated) : 38.6  Vital signs: Temp: 100 F (37.8 C) (01/09 2010) Temp Source: Oral (01/09 2010) BP: 106/63 (01/09 1712) Pulse Rate: 101 (01/09 1712) Recent Labs  Lab 02/25/23 1716  WBC 13.2*  CREATININE 1.08*   Estimated Creatinine Clearance: 52.4 mL/min (A) (by C-G formula based on SCr of 1.08 mg/dL (H)).  Allergies: Allergies  Allergen Reactions   Bee Venom Anaphylaxis and Shortness Of Breath    Stated by Patient   Shellfish Allergy Anaphylaxis and Shortness Of Breath    Stated by Patient Stated by Patient    Shellfish-Derived Products Anaphylaxis and Shortness Of Breath    Stated by Patient   Codeine    Thorazine [Chlorpromazine] Swelling   Tylenol  With Codeine #3 [Acetaminophen -Codeine] Other (See Comments)    Hallucinations   Metronidazole  Rash    Antimicrobials this admission: Ceftriaxone  1/9 >> Vancomycin  1/9 >>  Dose adjustments this admission: N/A  Microbiology results: 1/9 Bcx: in process  PLAN: Administer vancomycin  2250 mg IV x 1 as a loading dose, followed by vancomycin  750 mg IV q12H thereafter eAUC 485, Cmax 28.6, Cmin 14 Scr 1.08, IBW, Vd 0.5 Follow up culture results to assess for antibiotic optimization. Monitor renal function to assess for any necessary antibiotic dosing changes.   Thank you for allowing pharmacy to be a part of this patient's care.  Will M. Lenon, PharmD Clinical Pharmacist 02/25/2023 10:18 PM

## 2023-02-25 NOTE — ED Provider Notes (Addendum)
 [facility] Provider Note    Event Date/Time   First MD Initiated Contact with Patient 02/25/23 1825     (approximate)   History   Fever   HPI  Cristina [name] is a 63 y.o. female who presents today with history of COVID diagnosed on Monday.  Patient states fever, nauseas, dry cough.  Patient with history of diabetes      Physical Exam   Triage Vital Signs: ED Triage Vitals [02/25/23 1712]  Encounter Vitals Group     BP 106/63     Systolic BP Percentile      Diastolic BP Percentile      Pulse Rate (!) 101     Resp 18     Temp (!) 102.9 F (39.4 C)     Temp Source Oral     SpO2 94 %     Weight 220 lb (99.8 kg)     Height 4' 9 (1.448 m)     Head Circumference      Peak Flow      Pain Score 0     Pain Loc      Pain Education      Exclude from Growth Chart     Most recent vital signs: Vitals:   02/25/23 1712 02/25/23 2010  BP: 106/63   Pulse: (!) 101   Resp: 18   Temp: (!) 102.9 F (39.4 C) 100 F (37.8 C)  SpO2: 94%      Constitutional: Alert  Eyes: Conjunctivae are normal.  Head: Atraumatic. Nose: No congestion/rhinnorhea. Mouth/Throat: Mucous membranes are moist.   Neck: Painless ROM.  Cardiovascular:   Good peripheral circulation. Respiratory: Normal respiratory effort.  No retractions.  Gastrointestinal: Soft and nontender.  Musculoskeletal:  no deformity.  Left leg: Erythema from the ankle to the knee, warm, tender to palpation . Left foot dorsal: Desquamation of the skin of the plantar foot and presence of white tissue.  Tender to palpation, no crepitation Neurologic:  MAE spontaneously. No gross focal neurologic deficits are appreciated.  Skin:  Skin is warm, dry and intact. No rash noted. Psychiatric: Mood and affect are normal. Speech and behavior are normal.    ED Results / Procedures / Treatments   Labs (all labs ordered are listed, but only abnormal results are displayed) Labs Reviewed  CBC WITH  DIFFERENTIAL/PLATELET - Abnormal; Notable for the following components:      Result Value   WBC 13.2 (*)    Neutro Abs 11.8 (*)    Lymphs Abs 0.3 (*)    All other components within normal limits  COMPREHENSIVE METABOLIC PANEL - Abnormal; Notable for the following components:   Sodium 133 (*)    Chloride 96 (*)    Glucose, Bld 216 (*)    Creatinine, Ser 1.08 (*)    GFR, Estimated 57 (*)    All other components within normal limits  URINALYSIS, ROUTINE W REFLEX MICROSCOPIC - Abnormal; Notable for the following components:   Color, Urine YELLOW (*)    APPearance CLOUDY (*)    Leukocytes,Ua MODERATE (*)    Bacteria, UA FEW (*)    All other components within normal limits  RESP PANEL BY RT-PCR (RSV, FLU A&B, COVID)  RVPGX2  CULTURE, BLOOD (ROUTINE X 2)  CULTURE, BLOOD (ROUTINE X 2)  LACTIC ACID, PLASMA  LACTIC ACID, PLASMA     EKG     RADIOLOGY I independently reviewed and interpreted imaging and agree with radiologists findings.  PROCEDURES:  Critical Care performed:   Procedures   MEDICATIONS ORDERED IN ED: Medications  cefTRIAXone  (ROCEPHIN ) 2 g in sodium chloride  0.9 % 100 mL IVPB (2 g Intravenous New Bag/Given 02/25/23 2048)  vancomycin  (VANCOCIN ) IVPB 1000 mg/200 mL premix (1,000 mg Intravenous New Bag/Given 02/25/23 2056)  ibuprofen  (ADVIL ) tablet 800 mg (800 mg Oral Given 02/25/23 1720)     IMPRESSION / MDM / ASSESSMENT AND PLAN / ED COURSE  I reviewed the triage vital signs and the nursing notes.  Differential diagnosis includes, but is not limited to, COVID, cellulitis, upper respiratory infection, diabetic foot, sepsis  Patient's presentation is most consistent with acute complicated illness / injury requiring diagnostic workup.  Patient's diagnosis is consistent with sepsis, cellulitis, COVID, DM.  Patient came with diagnosis of COVID, but CBC showed bacterial infection.  At physical exam patient has evidence of cellulitis on her left leg and foot.   labs are rea reassuring.  Patient has white blood cells elevated, neutrophils elevated, lactic acid elevated,tachycardic, febrile.  Physical exam patient presents with desquamation of the skin in the left plantar foot,  erythema, edema, and warm skin in the left leg. (See the pictures) I did review the patient's allergies and medications. Patient will be admitted, patient meets criteria for sepsis, plan to start treatment with antibiotics IV.  Discussed plan of care with patient, answered all of patient's questions, Patient agreeable to plan of care.  Patient verbalized understanding.     FINAL CLINICAL IMPRESSION(S) / ED DIAGNOSES   Final diagnoses:  None     Rx / DC Orders   ED Discharge Orders     None        Note:  This document was prepared using Dragon voice recognition software and may include unintentional dictation errors.   Janit Kast, PA-C 02/25/23 2121    Janit Kast, PA-C 02/25/23 2139    Janit Kast, PA-C 02/25/23 2140    Willo Dunnings, MD 02/25/23 2300

## 2023-02-25 NOTE — H&P (Addendum)
 [place]   PATIENT NAME: Cristina [name]    MR#:  [id number]  DATE OF BIRTH:  July 26, 1960  DATE OF ADMISSION:  02/25/2023  PRIMARY CARE PHYSICIAN: Brutus Delon SAUNDERS, NP   Patient is coming from: [facility]   REQUESTING/REFERRING PHYSICIAN: Janit Kast, PA-C  CHIEF COMPLAINT:   Chief Complaint  Patient presents with   Fever    HISTORY OF PRESENT ILLNESS:  Cristina [name] is a 63 y.o. Caucasian female with medical history significant for COPD, CHF, type diabetes mellitus, GERD, hypertension and dyslipidemia, schizoaffective disorder, CVA, OSA, asthma and osteoarthritis, who presented to the emergency room with acute onset of fever with recent COVID 19 diagnosed on Monday.  She has been having fever and mild chills as well as nausea without vomiting or diarrhea.  She admitted to dry cough without wheezing or dyspnea.  Over the last week she has been developing left foot and leg swelling with erythema, warmth, tenderness and pain.  No wounds or drainage.  No chest pain or palpitations.  No dysuria, oliguria or hematuria or flank pain.  ED Course: When she came to the ER, temperature was 102.9 and later 100, heart rate 101 with respiratory rate of 18 and blood pressure 106/63 and pulse oximetry 94% on room air.  Labs revealed hyponatremia 133 and hypochloremia of 96 with blood glucose of 216 and otherwise unremarkable CMP.  Lactic acid was 3 and later 2 CBC showed leukocytosis 13.2 with neutrophilia.  COVID-19 PCR [place] positive with negative influenza A, B and RSV PCR's.  UA showed 21-50 the BCs with few bacteria and moderate leukocytes.  Blood cultures were drawn. EKG as reviewed by me : None Imaging: None.  The patient was given IV Rocephin  and vancomycin .  She will be admitted to a medical telemetry bed for further evaluation and management. PAST MEDICAL HISTORY:   Past Medical History:  Diagnosis Date   Abdominal hernia    Arthritis    Asthma    Bladder  dysfunction    Carpal tunnel syndrome    CHF (congestive heart failure) (HCC)    COPD (chronic obstructive pulmonary disease) (HCC)    DDD (degenerative disc disease), lumbar    Diabetes mellitus without complication (HCC)    Dyspnea    Edema    Fatty tumor    GERD (gastroesophageal reflux disease)    H/O colonoscopy    H/O tubal ligation    hx btl   History of cholecystectomy    hx of cholecystectomy   HLD (hyperlipidemia)    Hypertension    Muscle cramps    Neuropathy    Obesity    Recurrent boils    Schizoaffective disorder (HCC)    Sleep apnea    Stroke (HCC)    UTI (lower urinary tract infection)    Vaginitis    Wears dentures    full upper and lower    PAST SURGICAL HISTORY:   Past Surgical History:  Procedure Laterality Date   CARDIAC CATHETERIZATION Left 09/24/2014   Procedure: Left Heart Cath and Coronary Angiography;  Surgeon: Denyse DELENA Bathe, MD;  Location: [facility] INVASIVE CV LAB;  Service: Cardiovascular;  Laterality: Left;   CATARACT EXTRACTION W/PHACO Left 05/19/2022   Procedure: CATARACT EXTRACTION PHACO AND INTRAOCULAR LENS PLACEMENT (IOC) LEFT DIABETIC 12.14 01:05.8;  Surgeon: Jaye Fallow, MD;  Location: [facility];  Service: Ophthalmology;  Laterality: Left;  Diabetic   CATARACT EXTRACTION W/PHACO Right 06/02/2022   Procedure: CATARACT EXTRACTION  PHACO AND INTRAOCULAR LENS PLACEMENT (IOC) RIGHT DIABETIC 9.17 00:59.2;  Surgeon: Jaye Fallow, MD;  Location: [facility] SURGERY [facility];  Service: Ophthalmology;  Laterality: Right;  Diabetic   CHOLECYSTECTOMY     COLONOSCOPY WITH PROPOFOL  N/A 08/12/2015   Procedure: COLONOSCOPY WITH PROPOFOL ;  Surgeon: Gladis RAYMOND Mariner, MD;  Location: [facility] ENDOSCOPY;  Service: Endoscopy;  Laterality: N/A;   COLONOSCOPY WITH PROPOFOL  N/A 04/23/2017   Procedure: COLONOSCOPY WITH PROPOFOL ;  Surgeon: Therisa Bi, MD;  Location: [facility] ENDOSCOPY;  Service: Endoscopy;  Laterality: N/A;   COLONOSCOPY WITH PROPOFOL  N/A  03/04/2022   Procedure: COLONOSCOPY WITH PROPOFOL ;  Surgeon: Toledo, Ladell POUR, MD;  Location: [facility] ENDOSCOPY;  Service: Gastroenterology;  Laterality: N/A;   ENTEROSCOPY N/A 05/28/2017   Procedure: SMALL BOWEL ENTEROSCOPY;  Surgeon: Unk Corinn Skiff, MD;  Location: [facility] ENDOSCOPY;  Service: Gastroenterology;  Laterality: N/A;   ENTEROSCOPY N/A 06/04/2017   Procedure: RETROGRADE SMALL BOWEL ENTEROSCOPY;  Surgeon: Unk Corinn Skiff, MD;  Location: [facility] ENDOSCOPY;  Service: Gastroenterology;  Laterality: N/A;   ESOPHAGOGASTRODUODENOSCOPY (EGD) WITH PROPOFOL  N/A 08/12/2015   Procedure: ESOPHAGOGASTRODUODENOSCOPY (EGD) WITH PROPOFOL ;  Surgeon: Gladis RAYMOND Mariner, MD;  Location: [facility] ENDOSCOPY;  Service: Endoscopy;  Laterality: N/A;   ESOPHAGOGASTRODUODENOSCOPY (EGD) WITH PROPOFOL  N/A 03/06/2017   Procedure: ESOPHAGOGASTRODUODENOSCOPY (EGD) WITH PROPOFOL ;  Surgeon: Jinny Carmine, MD;  Location: [facility] ENDOSCOPY;  Service: Endoscopy;  Laterality: N/A;   ESOPHAGOGASTRODUODENOSCOPY (EGD) WITH PROPOFOL  N/A 03/04/2022   Procedure: ESOPHAGOGASTRODUODENOSCOPY (EGD) WITH PROPOFOL ;  Surgeon: Toledo, Ladell POUR, MD;  Location: [facility] ENDOSCOPY;  Service: Gastroenterology;  Laterality: N/A;   ESOPHAGOGASTRODUODENOSCOPY (EGD) WITH PROPOFOL  N/A 07/17/2022   Procedure: ESOPHAGOGASTRODUODENOSCOPY (EGD) WITH PROPOFOL ;  Surgeon: Maryruth Ole DASEN, MD;  Location: [facility] ENDOSCOPY;  Service: Endoscopy;  Laterality: N/A;   EYE SURGERY     GIVENS CAPSULE STUDY N/A 05/25/2017   Procedure: GIVENS CAPSULE STUDY;  Surgeon: Jinny Carmine, MD;  Location: [facility] ENDOSCOPY;  Service: Endoscopy;  Laterality: N/A;   tubal ligation      SOCIAL HISTORY:   Social History   Tobacco Use   Smoking status: Some Days    Current packs/day: 0.25    Average packs/day: 0.3 packs/day for 40.0 years (10.0 ttl pk-yrs)    Types: Cigarettes   Smokeless tobacco: Former    Types: Chew   Tobacco comments:    Sneaks cigarettes from other  residents  Substance Use Topics   Alcohol use: No    Alcohol/week: 0.0 standard drinks of alcohol    FAMILY HISTORY:   Family History  Problem Relation Age of Onset   CAD Mother    Lung cancer Mother    Mental illness Mother    Mental illness Father 22       suicided    Mental illness Sister    CAD Sister    Mental illness Brother    CAD Sister     DRUG ALLERGIES:   Allergies  Allergen Reactions   Bee Venom Anaphylaxis and Shortness Of Breath    Stated by Patient   Shellfish Allergy Anaphylaxis and Shortness Of Breath    Stated by Patient Stated by Patient    Shellfish-Derived Products Anaphylaxis and Shortness Of Breath    Stated by Patient   Codeine    Thorazine [Chlorpromazine] Swelling   Tylenol  With Codeine #3 [Acetaminophen -Codeine] Other (See Comments)    Hallucinations   Metronidazole  Rash    REVIEW OF SYSTEMS:   ROS As per history of present illness. All pertinent systems were reviewed above. Constitutional, HEENT,  cardiovascular, respiratory, GI, GU, musculoskeletal, neuro, psychiatric, endocrine, integumentary and hematologic systems were reviewed and are otherwise negative/unremarkable except for positive findings mentioned above in the HPI.   MEDICATIONS AT HOME:   Prior to Admission medications   Medication Sig Start Date End Date Taking? Authorizing Provider  acetaminophen  (TYLENOL ) 500 MG tablet Take 500 mg by mouth every 8 (eight) hours as needed for mild pain or moderate pain.    [provider]  albuterol  (PROVENTIL  HFA;VENTOLIN  HFA) 108 (90 Base) MCG/ACT inhaler Inhale 2 puffs into the lungs every 6 (six) hours as needed for shortness of breath.    [provider]  aspirin  EC 81 MG tablet Take 81 mg by mouth daily.    [provider]  atorvastatin  (LIPITOR) 20 MG tablet Take 20 mg by mouth at bedtime.    [provider]  busPIRone  (BUSPAR ) 10 MG tablet Take 10 mg by mouth 2 (two) times daily.     [provider]  Calcium  Carbonate (CALCIUM  600 PO) Take by mouth daily.    [provider]  Cholecalciferol (VITAMIN D-3 PO) Take by mouth.    [provider]  clonazePAM  (KLONOPIN ) 0.5 MG tablet Take 1 tablet (0.5 mg total) by mouth 2 (two) times daily. Patient taking differently: Take 0.5 mg by mouth daily. 07/18/16   Gouru, Aruna, MD  clonazePAM  (KLONOPIN ) 1 MG tablet Take 1 mg by mouth at bedtime.    [provider]  dicyclomine  (BENTYL ) 20 MG tablet Take 20 mg by mouth 4 (four) times daily.    [provider]  diphenoxylate -atropine  (LOMOTIL ) 2.5-0.025 MG tablet Take 1 tablet by mouth daily as needed for diarrhea or loose stools.    [provider]  famotidine  (PEPCID ) 20 MG tablet Take 20 mg by mouth daily.    [provider]  Fluticasone  Furoate-Vilanterol (BREO ELLIPTA  IN) Inhale into the lungs daily.    [provider]  furosemide  (LASIX ) 20 MG tablet Take 1 tablet (20 mg total) by mouth 2 (two) times daily. Patient taking differently: Take 40 mg by mouth daily. 07/18/16   Gouru, Aruna, MD  glipiZIDE  (GLUCOTROL ) 5 MG tablet Take 5 mg by mouth in the morning and at bedtime.    [provider]  haloperidol  decanoate (HALDOL  DECANOATE) 100 MG/ML injection Inject 1 mL (100 mg total) into the muscle every 30 (thirty) days. 08/02/15   Ragena Odor, MD  Insulin  Pen Needle 31G X 8 MM MISC PEN NEEDLES 31G X 6 MM MISC 10/07/12   [provider]  lisinopril  (ZESTRIL ) 5 MG tablet Take 5 mg by mouth daily.    [provider]  loperamide  (IMODIUM  A-D) 2 MG tablet Take 2 mg by mouth 4 (four) times daily as needed for diarrhea or loose stools.    [provider]  Menthol (HONEY LEMON COUGH DROPS MT) Use as directed 1 lozenge in the mouth or throat 4 (four) times daily as needed (cough).    [provider]  metoprolol  tartrate (LOPRESSOR ) 25 MG tablet Take 0.5 tablets (12.5 mg total) by mouth  2 (two) times daily. Patient taking differently: Take 25 mg by mouth daily. 07/18/16   Gouru, Aruna, MD  nitroGLYCERIN  (NITROSTAT ) 0.3 MG SL tablet Place 0.3 mg under the tongue every 5 (five) minutes as needed for chest pain.    [provider]  ondansetron  (ZOFRAN ) 4 MG tablet Take 4 mg by mouth every 6 (six) hours as needed for nausea or vomiting.  [provider]  oxybutynin  (DITROPAN -XL) 5 MG 24 hr tablet Take 2 tablets (10 mg total) by mouth daily. 07/17/20   Stoioff, Glendia BROCKS, MD  potassium chloride  (K-DUR) 10 MEQ tablet Take 10 mEq by mouth daily.    [provider]  pregabalin  (LYRICA ) 100 MG capsule Take 100 mg by mouth 3 (three) times daily.    [provider]  sitaGLIPtin (JANUVIA) 100 MG tablet Take 100 mg by mouth daily.    [provider]  traMADol  (ULTRAM ) 50 MG tablet Take 1 tablet (50 mg total) by mouth every 6 (six) hours as needed. 12/10/22 12/10/23  Angelena Smalls, MD  venlafaxine  XR (EFFEXOR -XR) 150 MG 24 hr capsule Take 150 mg by mouth daily with breakfast. Take with 75mg  capsule for a total of 225mg     [provider]  venlafaxine  XR (EFFEXOR -XR) 75 MG 24 hr capsule Take 75 mg by mouth daily with breakfast. Take with 150mg  capsule for a total of 225mg     [provider]      VITAL SIGNS:  Blood pressure 106/63, pulse (!) 101, temperature 100 F (37.8 C), temperature source Oral, resp. rate 18, height 4' 9 (1.448 m), weight 99.8 kg, SpO2 94%.  PHYSICAL EXAMINATION:  Physical Exam  GENERAL:  63 y.o.-year-old, Caucasian female patient lying in the bed with no acute distress.  EYES: Pupils equal, round, reactive to light and accommodation. No scleral icterus. Extraocular muscles intact.  HEENT: Head atraumatic, normocephalic. Oropharynx and nasopharynx clear.  NECK:  Supple, no jugular venous distention. No thyroid  enlargement, no tenderness.  LUNGS: Normal breath sounds bilaterally, no wheezing, rales,rhonchi  or crepitation. No use of accessory muscles of respiration.  CARDIOVASCULAR: Regular rate and rhythm, S1, S2 normal. No murmurs, rubs, or gallops.  ABDOMEN: Soft, nondistended, nontender. Bowel sounds present. No organomegaly or mass.  EXTREMITIES: No pedal edema, cyanosis, or clubbing.  NEUROLOGIC: Cranial nerves II through XII are intact. Muscle strength 5/5 in all extremities. Sensation intact. Gait not checked.  PSYCHIATRIC: The patient is alert and oriented x 3.  Normal affect and good eye contact. SKIN: Left plantar foot and leg swelling with erythema, induration, warmth and tenderness without fluctuation.  She had significant scaling over her left sole.          LABORATORY PANEL:   CBC Recent Labs  Lab 02/25/23 1716  WBC 13.2*  HGB 12.2  HCT 37.2  PLT 181   ------------------------------------------------------------------------------------------------------------------  Chemistries  Recent Labs  Lab 02/25/23 1716  NA 133*  K 4.3  CL 96*  CO2 26  GLUCOSE 216*  BUN 19  CREATININE 1.08*  CALCIUM  8.9  AST 36  ALT 24  ALKPHOS 113  BILITOT 0.6   ------------------------------------------------------------------------------------------------------------------  Cardiac Enzymes No results for input(s): TROPONINI in the last 168 hours. ------------------------------------------------------------------------------------------------------------------  RADIOLOGY:  No results found.    IMPRESSION AND PLAN:  Assessment and Plan: * Sepsis due to cellulitis (HCC) - Sepsis is manifested by fever, tachycardia, and leukocytosis. - Severe sepsis criteria are met given elevated lactic acid. - Sepsis like secondary to right lower extremity cellulitis as well as acute lower UTI. - The patient will be admitted to a medical telemetry bed. - We will continue antibiotic therapy with IV Rocephin  and vancomycin . - We will follow blood cultures as well as urine culture and  sensitivity.  Essential hypertension - We will continue antihypertensive therapy.  Type 2 diabetes mellitus without complications (HCC) - The patient will be placed on supplemental coverage with  NovoLog . - We will continue Januvia and glipizide . - We will hold off metformin .  COVID-19 virus infection - Symptomatic management will be provided. - No current hypoxia to require steroids. - The patient will be on isolation.  Anxiety and depression - We will continue BuSpar  and Effexor  XR.  Dyslipidemia - We will continue statin therapy.   DVT prophylaxis: Lovenox .  Advanced Care Planning:  Code Status: full code.  Family Communication:  The plan of care was discussed in details with the patient (and family). I answered all questions. The patient agreed to proceed with the above mentioned plan. Further management will depend upon hospital course. Disposition Plan: Back to previous home environment Consults called: none.  All the records are reviewed and case discussed with ED provider.  Status is: Inpatient  At the time of the admission, it appears that the appropriate admission status for this patient is inpatient.  This is judged to be reasonable and necessary in order to provide the required intensity of service to ensure the patient's safety given the presenting symptoms, physical exam findings and initial radiographic and laboratory data in the context of comorbid conditions.  The patient requires inpatient status due to high intensity of service, high risk of further deterioration and high frequency of surveillance required.  I certify that at the time of admission, it is my clinical judgment that the patient will require inpatient hospital care extending more than 2 midnights.                            Dispo: The patient is from: [facility]               Anticipated d/c is to: [facility]               Patient currently is not medically stable to  d/c.              Difficult to place patient: No  Madison DELENA Peaches M.D on 02/26/2023 at 12:11 AM  [facility]   From 7 PM-7 AM, contact night-coverage [email or web address]  CC: Primary care physician; Brutus Delon SAUNDERS, NP

## 2023-02-25 NOTE — ED Triage Notes (Signed)
 Patient to ED via [facility] from [facility] for fever. PT reports being dx with Covid on Monday. States she was not given anything for fever while at nursing facility.

## 2023-02-26 ENCOUNTER — Inpatient Hospital Stay: Payer: Medicare Other

## 2023-02-26 DIAGNOSIS — A419 Sepsis, unspecified organism: Secondary | ICD-10-CM | POA: Diagnosis not present

## 2023-02-26 DIAGNOSIS — E785 Hyperlipidemia, unspecified: Secondary | ICD-10-CM | POA: Diagnosis present

## 2023-02-26 DIAGNOSIS — L0390 Cellulitis, unspecified: Secondary | ICD-10-CM | POA: Diagnosis not present

## 2023-02-26 DIAGNOSIS — E119 Type 2 diabetes mellitus without complications: Secondary | ICD-10-CM

## 2023-02-26 DIAGNOSIS — U071 COVID-19: Secondary | ICD-10-CM | POA: Diagnosis present

## 2023-02-26 DIAGNOSIS — F32A Depression, unspecified: Secondary | ICD-10-CM | POA: Diagnosis present

## 2023-02-26 DIAGNOSIS — F419 Anxiety disorder, unspecified: Secondary | ICD-10-CM | POA: Diagnosis present

## 2023-02-26 DIAGNOSIS — I10 Essential (primary) hypertension: Secondary | ICD-10-CM | POA: Diagnosis present

## 2023-02-26 HISTORY — DX: COVID-19: U07.1

## 2023-02-26 LAB — BASIC METABOLIC PANEL
Anion gap: 10 (ref 5–15)
Anion gap: 10 (ref 5–15)
BUN: 28 mg/dL — ABNORMAL HIGH (ref 8–23)
BUN: 21 mg/dL (ref 8–23)
CO2: 27 mmol/L (ref 22–32)
CO2: 28 mmol/L (ref 22–32)
Calcium: 8.3 mg/dL — ABNORMAL LOW (ref 8.9–10.3)
Calcium: 8.3 mg/dL — ABNORMAL LOW (ref 8.9–10.3)
Chloride: 92 mmol/L — ABNORMAL LOW (ref 98–111)
Chloride: 98 mmol/L (ref 98–111)
Creatinine, Ser: 1.53 mg/dL — ABNORMAL HIGH (ref 0.44–1.00)
Creatinine, Ser: 1.17 mg/dL — ABNORMAL HIGH (ref 0.44–1.00)
GFR, Estimated: 38 mL/min — ABNORMAL LOW (ref >60)
GFR, Estimated: 52 mL/min — ABNORMAL LOW (ref >60)
Glucose, Bld: 150 mg/dL — ABNORMAL HIGH (ref 70–99)
Glucose, Bld: 158 mg/dL — ABNORMAL HIGH (ref 70–99)
Potassium: 3.8 mmol/L (ref 3.5–5.1)
Potassium: 3.6 mmol/L (ref 3.5–5.1)
Sodium: 129 mmol/L — ABNORMAL LOW (ref 135–145)
Sodium: 134 mmol/L — ABNORMAL LOW (ref 135–145)

## 2023-02-26 LAB — CORTISOL-AM, BLOOD: Cortisol - AM: 11.2 ug/dL (ref 6.7–22.6)

## 2023-02-26 LAB — PROTIME-INR
INR: 1.3 — ABNORMAL HIGH (ref 0.8–1.2)
Prothrombin Time: 16.2 s — ABNORMAL HIGH (ref 11.4–15.2)

## 2023-02-26 LAB — CBC
HCT: 34.3 % — ABNORMAL LOW (ref 36.0–46.0)
Hemoglobin: 11.1 g/dL — ABNORMAL LOW (ref 12.0–15.0)
MCH: 27.8 pg (ref 26.0–34.0)
MCHC: 32.4 g/dL (ref 30.0–36.0)
MCV: 85.8 fL (ref 80.0–100.0)
Platelets: 145 10*3/uL — ABNORMAL LOW (ref 150–400)
RBC: 4 MIL/uL (ref 3.87–5.11)
RDW: 15 % (ref 11.5–15.5)
WBC: 13.8 10*3/uL — ABNORMAL HIGH (ref 4.0–10.5)
nRBC: 0 % (ref 0.0–0.2)

## 2023-02-26 LAB — CBG MONITORING, ED
Glucose-Capillary: 128 mg/dL — ABNORMAL HIGH (ref 70–99)
Glucose-Capillary: 200 mg/dL — ABNORMAL HIGH (ref 70–99)
Glucose-Capillary: 185 mg/dL — ABNORMAL HIGH (ref 70–99)

## 2023-02-26 LAB — HEMOGLOBIN A1C
Hgb A1c MFr Bld: 7.3 % — ABNORMAL HIGH (ref 4.8–5.6)
Mean Plasma Glucose: 162.81 mg/dL

## 2023-02-26 LAB — GLUCOSE, CAPILLARY: Glucose-Capillary: 157 mg/dL — ABNORMAL HIGH (ref 70–99)

## 2023-02-26 LAB — LACTIC ACID, PLASMA: Lactic Acid, Venous: 2.2 mmol/L (ref 0.5–1.9)

## 2023-02-26 LAB — BRAIN NATRIURETIC PEPTIDE: B Natriuretic Peptide: 113.4 pg/mL — ABNORMAL HIGH (ref 0.0–100.0)

## 2023-02-26 LAB — HIV ANTIBODY (ROUTINE TESTING W REFLEX): HIV Screen 4th Generation wRfx: NONREACTIVE

## 2023-02-26 MED ORDER — SODIUM CHLORIDE 0.9 % IV BOLUS
500.0000 mL | Freq: Once | INTRAVENOUS | Status: AC
  Administered 2023-02-26: 500 mL via INTRAVENOUS

## 2023-02-26 MED ORDER — VANCOMYCIN VARIABLE DOSE PER UNSTABLE RENAL FUNCTION (PHARMACIST DOSING): Status: DC

## 2023-02-26 MED ORDER — METOPROLOL TARTRATE 25 MG PO TABS
25.0000 mg | ORAL_TABLET | Freq: Every day | ORAL | Status: DC
  Administered 2023-02-28 – 2023-03-02 (×2): 25 mg via ORAL
  Filled 2023-02-26 (×5): qty 1

## 2023-02-26 MED ORDER — CLONAZEPAM 0.5 MG PO TABS
0.5000 mg | ORAL_TABLET | Freq: Two times a day (BID) | ORAL | Status: DC
  Administered 2023-02-26 – 2023-03-02 (×9): 0.5 mg via ORAL
  Filled 2023-02-26 (×9): qty 1

## 2023-02-26 NOTE — Assessment & Plan Note (Addendum)
--  Continue home lisinopril, metoprolol --Hold home Lasix while on IV fluids for lactic acidosis

## 2023-02-26 NOTE — ED Notes (Addendum)
 Reassessment completed by this RN at this time and upon repeat vital check, a bp of 81/51  noted and provider on call contacted via secure chat for orders to aid in low bp. At this time, this RN is waiting on orders for next steps with this low vital sign reading. Pt noted to be asleep and arousable but did take PM meds that make her drowsy.   0630: NP Provider ordered NS bolus and this RN gave as directed. Pt tolerating bolus without issues and is in no acute distress.

## 2023-02-26 NOTE — Assessment & Plan Note (Addendum)
--  Continue home BuSpar and Effexor, Klonopin at bedtime --If d/c on Zyvox, need to monitor for serotonin syndrome

## 2023-02-26 NOTE — Consult Note (Addendum)
 Pharmacy Antibiotic Note  ASSESSMENT: 63 y.o. female with PMH including obesity, diabetes, HTN, rUTI, arthritis is presenting with cellulitis. Pt with mild fever of 100, tachycardia, and leukocytosis. Pharmacy has been consulted to manage vancomycin  dosing.  She was diagnosed with COVID on 02/22/2023.  Scr now 1.53 (up from 1.08 on admission).  Patient measurements: Height: 4' 9 (144.8 cm) Weight: 99.8 kg (220 lb) IBW/kg (Calculated) : 38.6  Vital signs: Temp: 98.4 F (36.9 C) (01/10 1231) Temp Source: Oral (01/10 1231) BP: 120/90 (01/10 1231) Pulse Rate: 100 (01/10 1231) Recent Labs  Lab 02/25/23 1716 02/26/23 0457  WBC 13.2* 13.8*  CREATININE 1.08* 1.53*   Estimated Creatinine Clearance: 37 mL/min (A) (by C-G formula based on SCr of 1.53 mg/dL (H)).  Allergies: Allergies  Allergen Reactions   Bee Venom Anaphylaxis and Shortness Of Breath    Stated by Patient   Shellfish Allergy Anaphylaxis and Shortness Of Breath    Stated by Patient Stated by Patient    Shellfish-Derived Products Anaphylaxis and Shortness Of Breath    Stated by Patient   Codeine    Thorazine [Chlorpromazine] Swelling   Tylenol  With Codeine #3 [Acetaminophen -Codeine] Other (See Comments)    Hallucinations   Metronidazole  Rash   Antimicrobials this admission: Ceftriaxone  1/9 >> Vancomycin  1/9 >>  Dose adjustments this admission: N/A  Microbiology results: 1/9 BCx: NGTD 1/9 BCx: In process   PLAN: Renal function worsening this AM Plan to transition to dosing vancomycin  per levels  Will check vancomycin  random level tomorrow morning Monitor renal function, clinical status, culture data, and LOT  Thank you for involving pharmacy in this patient's care.   Damien Napoleon, PharmD Clinical Pharmacist 02/26/2023 1:59 PM

## 2023-02-26 NOTE — Assessment & Plan Note (Addendum)
--  Sliding scale Novolog --Continue home Januvia (Tradjenta formulary substitute) and glipizide --Hold metformin --Hypoglycemia protocol --Titrate regimen for inpatient goal 140-180

## 2023-02-26 NOTE — ED Notes (Signed)
 Bolus infusing with pressure bag and BP has improved to 94/50. See flowsheet for positive trend to a more normotensive blood pressure.

## 2023-02-26 NOTE — ED Notes (Signed)
 Patient's bed alarm went off, patient found trying to get out of bed. Patient had pulled IV out and removed pulse ox. Patient stated she needed to go to the bathroom. Patient repositioned in bed and educated not to get out of bed without help from staff. Bed alarm on. TV turned on for distraction. Warm blankets provided. No other needs at this time.

## 2023-02-26 NOTE — Progress Notes (Signed)
       CROSS COVER NOTE  NAME: Cristina [name] MRN: [id number] DOB : 11-Aug-1960    Concern as stated by nurse / staff   Message received from RN Walterine America, this patient currently has a bp of 81/51 with a MAP of 62. She still has LR infusing at a continuous rate of 165ml/hr. Can I give her a bolus to aid in her low bp?      Pertinent findings on chart review:   Assessment and  Interventions   Assessment:    02/26/2023    3:04 AM 02/26/2023    3:03 AM 02/26/2023    2:30 AM  Vitals with BMI  Systolic 93 93 90  Diastolic 61 61 58  Pulse 76 87 97   Last lactic remained at 2 AKI with creat at 1.53 from 1.08 yesterday Sodium 129 with relatively normal glucose 150  Plan: NS bolus 500 ml over and hour       America LITTIE Cone NP [facility] Cross Cover 7pm-7am - check amion for availability Pager [phone]

## 2023-02-26 NOTE — ED Notes (Signed)
 Pt BP reading of 77/47 on monitor. With this, this RN assessed patient who was found to be sleeping with equal chest rise and fall and palpable pulse. The blood pressure cuff was both readjusted and changed  and a new reading once retaken was a BP of 90/56.   0600: BP reading 84/39 with this RN rechecking reading at 81/51. With the recheck provider notified and orders for a fluid bolus given and administered by this RN.   See flowsheet for improved BP reading after bolus infusing.

## 2023-02-26 NOTE — Assessment & Plan Note (Addendum)
 Reportedly diagnosed on 02/22/23 --Airborne and contact isolation --Symptomatic care PRN per orders

## 2023-02-26 NOTE — Assessment & Plan Note (Addendum)
 Continue statin.

## 2023-02-26 NOTE — Assessment & Plan Note (Addendum)
 Severe sepsis POA with fever, tachycardia, and leukocytosis, lactic acidosis consistent with organ dysfunction.  Sepsis physiology resolved.  Lactate normalized.   Hemodynamically stable. Right lower extremity cellulitis  1/10 -- U/S RLE negative for DVT 1/11 -- erythema improving 1/12 -- increased erythema warmth and swelling again Acute lower UTI. 1/11 -- Urine culture growing over 100k gram negative rods 1/12 -- Urine cx E coli resistant to only Cipro  --Completed IV Rocephin  for UTI  --Continue IV Vancomycin  for cellulitis --Consider linezolid for anti-toxin properties in next 24 hours if not showing improvement in erythema --Follow blood and urine cultures --Monitor fever curve, CBC

## 2023-02-26 NOTE — Assessment & Plan Note (Signed)
Will continue Effexor XR.

## 2023-02-26 NOTE — Progress Notes (Signed)
 Progress Note   Patient: Cristina [name] FMW:[id number] DOB: [birth year] DOA: 02/25/2023     1 DOS: the patient was seen and examined on 02/26/2023   Brief hospital course:  HPI on admission:  Cristina [name] is a 63 y.o. Caucasian female with medical history significant for COPD, CHF, type diabetes mellitus, GERD, hypertension and dyslipidemia, schizoaffective disorder, CVA, OSA, asthma and osteoarthritis, who presented to the emergency room with acute onset of fever with recent COVID 19 diagnosed on Monday.  She has been having fever and mild chills as well as nausea without vomiting or diarrhea.  She admitted to dry cough without wheezing or dyspnea.  Over the last week she has been developing left foot and leg swelling with erythema, warmth, tenderness and pain    See H&P for full HPI on admission & ED course.  Pt was admitted meeting severe sepsis criteria with fever, tachycardia, leukocytosis lactic acidosis in the setting of right lower extremity cellulitis and Covid-19 infection.  Pt was started on IV antibiotics and given IV fluids per sepsis protocol.    Assessment and Plan: * Sepsis due to cellulitis (HCC) Severe sepsis POA with fever, tachycardia, and leukocytosis, lactic acidosis consistent with organ dysfunction. Right lower extremity cellulitis  Acute lower UTI. --RLE doppler U/S to rule out DVT - negative --Continue IV Rocephin  --Follow blood and urine cultures --Monitor fever curve, CBC, hemodynamics --Trend lactate until normalized  Essential hypertension --Continue home lisinopril , metoprolol  --Hold home Lasix  while on IV fluids for lactic acidosis  Type 2 diabetes mellitus without complications (HCC) --Sliding scale Novolog  --Continue home Januvia (Tradjenta  formulary substitute) and glipizide  --Hold metformin  --Hypoglycemia protocol --Titrate regimen for inpatient goal 140-180  COVID-19 virus infection Reportedly diagnosed on 02/22/23 --Airborne and contact  isolation --Symptomatic care PRN per orders  Anxiety and depression --Continue BuSpar  and Effexor    Dyslipidemia --Continue statin        Subjective: Pt seen in ED holding for a bed today. She is restless, had tied her nasal cannula tubing around the bed rail.  States she is supposed to use oxygen at home. Denies SOB.  Pt seems confused and not really able to tell me her recent symptoms.  Repeatedly asks for the bed rails to be lowered.  States she lives alone, was not able to tell me if anyone I should call to update.   Physical Exam: Vitals:   02/26/23 0926 02/26/23 0953 02/26/23 1231 02/26/23 1430  BP: (!) 94/50 (!) 119/58 (!) 120/90 120/86  Pulse:  100 100 (!) 109  Resp:  18 18 18   Temp:   98.4 F (36.9 C)   TempSrc:   Oral   SpO2:  100% 98% 100%  Weight:      Height:       General exam: awake, alert, no acute distress, obese, mildly ill appearing HEENT: moist mucus membranes, hearing grossly normal  Respiratory system: (exam limited by patient mumbling), CTAB but generally diminished, no wheezes, rnormal respiratory effort. Cardiovascular system: normal S1/S2, RRR, no pedal edema.   Gastrointestinal system: soft, NT, ND, no HSM felt, +bowel sounds. Central nervous system: pt not answering orientation questions, confused. no gross focal neurologic deficits, normal speech Extremities: tense erythema and warmth of distal right leg sparing the foot, Skin: dry, intact, normal temperature Psychiatry: normal mood, congruent affect, abnormal judgement and insight     Data Reviewed:  Notable labs ---  Na 133 >> 129 >> 134 Glucose 158 Cr 1.08 >> 1.53 >> 1.17 BNP 113.4 Lactic  acid 3.0 >> 2.0 >> 2.2 WBC 13.8k  RLE duplex U/S negative for DVT  Family Communication: None present, will attempt to call  Disposition: Status is: Inpatient Remains inpatient appropriate because: severity of illness as above, ongoing sepsis physiology, remains on IV antibiotics pending  cultures   Planned Discharge Destination: Home    Time spent: 45 minutes  Author: Burnard DELENA Cunning, DO 02/26/2023 4:08 PM  For on call review [email or web address].

## 2023-02-27 DIAGNOSIS — L0390 Cellulitis, unspecified: Secondary | ICD-10-CM | POA: Diagnosis not present

## 2023-02-27 DIAGNOSIS — E871 Hypo-osmolality and hyponatremia: Secondary | ICD-10-CM

## 2023-02-27 DIAGNOSIS — E876 Hypokalemia: Secondary | ICD-10-CM | POA: Diagnosis not present

## 2023-02-27 DIAGNOSIS — R531 Weakness: Secondary | ICD-10-CM

## 2023-02-27 DIAGNOSIS — J9611 Chronic respiratory failure with hypoxia: Secondary | ICD-10-CM | POA: Diagnosis present

## 2023-02-27 DIAGNOSIS — A419 Sepsis, unspecified organism: Secondary | ICD-10-CM | POA: Diagnosis not present

## 2023-02-27 HISTORY — DX: Hypo-osmolality and hyponatremia: E87.1

## 2023-02-27 HISTORY — DX: Hypokalemia: E87.6

## 2023-02-27 LAB — CBC
HCT: 33.9 % — ABNORMAL LOW (ref 36.0–46.0)
Hemoglobin: 11.2 g/dL — ABNORMAL LOW (ref 12.0–15.0)
MCH: 27.5 pg (ref 26.0–34.0)
MCHC: 33 g/dL (ref 30.0–36.0)
MCV: 83.3 fL (ref 80.0–100.0)
Platelets: 143 10*3/uL — ABNORMAL LOW (ref 150–400)
RBC: 4.07 MIL/uL (ref 3.87–5.11)
RDW: 15 % (ref 11.5–15.5)
WBC: 12 10*3/uL — ABNORMAL HIGH (ref 4.0–10.5)
nRBC: 0 % (ref 0.0–0.2)

## 2023-02-27 LAB — BASIC METABOLIC PANEL
Anion gap: 11 (ref 5–15)
BUN: 20 mg/dL (ref 8–23)
CO2: 25 mmol/L (ref 22–32)
Calcium: 8.3 mg/dL — ABNORMAL LOW (ref 8.9–10.3)
Chloride: 97 mmol/L — ABNORMAL LOW (ref 98–111)
Creatinine, Ser: 0.94 mg/dL (ref 0.44–1.00)
GFR, Estimated: >60 mL/min (ref >60)
Glucose, Bld: 179 mg/dL — ABNORMAL HIGH (ref 70–99)
Potassium: 3.3 mmol/L — ABNORMAL LOW (ref 3.5–5.1)
Sodium: 133 mmol/L — ABNORMAL LOW (ref 135–145)

## 2023-02-27 LAB — VANCOMYCIN, RANDOM: Vancomycin Rm: 8 ug/mL

## 2023-02-27 LAB — GLUCOSE, CAPILLARY
Glucose-Capillary: 144 mg/dL — ABNORMAL HIGH (ref 70–99)
Glucose-Capillary: 206 mg/dL — ABNORMAL HIGH (ref 70–99)
Glucose-Capillary: 176 mg/dL — ABNORMAL HIGH (ref 70–99)
Glucose-Capillary: 172 mg/dL — ABNORMAL HIGH (ref 70–99)

## 2023-02-27 LAB — LACTIC ACID, PLASMA: Lactic Acid, Venous: 0.8 mmol/L (ref 0.5–1.9)

## 2023-02-27 MED ORDER — IPRATROPIUM-ALBUTEROL 20-100 MCG/ACT IN AERS
1.0000 | INHALATION_SPRAY | Freq: Four times a day (QID) | RESPIRATORY_TRACT | Status: DC
  Administered 2023-02-27 – 2023-03-02 (×10): 1 via RESPIRATORY_TRACT
  Filled 2023-02-27: qty 4

## 2023-02-27 MED ORDER — POTASSIUM CHLORIDE CRYS ER 20 MEQ PO TBCR
40.0000 meq | EXTENDED_RELEASE_TABLET | Freq: Once | ORAL | Status: AC
  Administered 2023-02-27: 40 meq via ORAL
  Filled 2023-02-27: qty 2

## 2023-02-27 MED ORDER — GUAIFENESIN-DM 100-10 MG/5ML PO SYRP
5.0000 mL | ORAL_SOLUTION | ORAL | Status: DC | PRN
  Administered 2023-02-27 – 2023-02-28 (×2): 5 mL via ORAL
  Filled 2023-02-27 (×2): qty 10

## 2023-02-27 MED ORDER — VANCOMYCIN HCL 750 MG/150ML IV SOLN
750.0000 mg | INTRAVENOUS | Status: DC
  Filled 2023-02-27: qty 150

## 2023-02-27 MED ORDER — MUPIROCIN 2 % EX OINT
TOPICAL_OINTMENT | Freq: Every day | CUTANEOUS | Status: DC
  Filled 2023-02-27: qty 22

## 2023-02-27 MED ORDER — VANCOMYCIN HCL 750 MG IV SOLR
750.0000 mg | INTRAVENOUS | Status: DC
  Administered 2023-02-27 – 2023-02-28 (×2): 750 mg via INTRAVENOUS
  Filled 2023-02-27 (×3): qty 15

## 2023-02-27 MED ORDER — IPRATROPIUM BROMIDE 0.02 % IN SOLN: 2.5000 mL | Freq: Four times a day (QID) | RESPIRATORY_TRACT | Status: DC

## 2023-02-27 NOTE — Progress Notes (Addendum)
 Progress Note   Patient: Cristina [name] FMW:[id number] DOB: 05-30-1960 DOA: 02/25/2023     2 DOS: the patient was seen and examined on 02/27/2023   Brief hospital course:  HPI on admission:  Cristina [name] is a 63 y.o. Caucasian female with medical history significant for COPD, CHF, type diabetes mellitus, GERD, hypertension and dyslipidemia, schizoaffective disorder, CVA, OSA, asthma and osteoarthritis, who presented to the emergency room with acute onset of fever with recent COVID 19 diagnosed on Monday.  She has been having fever and mild chills as well as nausea without vomiting or diarrhea.  She admitted to dry cough without wheezing or dyspnea.  Over the last week she has been developing left foot and leg swelling with erythema, warmth, tenderness and pain    See H&P for full HPI on admission & ED course.  Pt was admitted meeting severe sepsis criteria with fever, tachycardia, leukocytosis lactic acidosis in the setting of right lower extremity cellulitis and Covid-19 infection.  Pt was started on IV antibiotics and given IV fluids per sepsis protocol.    Assessment and Plan: * Sepsis due to cellulitis (HCC) Severe sepsis POA with fever, tachycardia, and leukocytosis, lactic acidosis consistent with organ dysfunction.  Sepsis physiology improving, pt still febrile overnight. Lactate normalized. Right lower extremity cellulitis  1/10 -- U/S RLE negative for DVT 1/11 -- erythema improving Acute lower UTI. 1/11 -- Urine culture growing over 100k gram negative rods --RLE doppler U/S to rule out DVT - negative --Continue IV Rocephin  --Follow blood and urine cultures --Monitor fever curve, CBC, hemodynamics  Hyponatremia Na 129 improved to 134 with sepsis IV fluids, consistent with hypovolemia. --Off IV fluids --Lasix  was held, will hold for today --Monitor BMP and volume status  Hypokalemia K 3.3 today -- PO replacement ordered. --Monitor BMP, replace K as needed --Check Mg  level  COVID-19 virus infection Reportedly diagnosed on 02/22/23 --Airborne and contact isolation --Symptomatic care PRN per orders  Generalized weakness Due to multiple acute infections and underlying COPD, deconditioning. Pt is from ALF --PT/OT evaluations --TOC consulted for [facility] or SNF arrangements  Chronic respiratory failure with hypoxia (HCC) Pt reports she is supposed to be on 2 L/min oxygen at home.  Oxygen requirement is fairly stable, on 2-3 L/min. COPD not exacerbated, monitor closely. --O2 per protocol, maintain sats 88-93%  Essential hypertension --Continue home lisinopril , metoprolol  --Hold home Lasix  while on IV fluids for lactic acidosis  Type 2 diabetes mellitus without complications (HCC) --Sliding scale Novolog  --Continue home Januvia (Tradjenta  formulary substitute) and glipizide  --Hold metformin  --Hypoglycemia protocol --Titrate regimen for inpatient goal 140-180  COPD (chronic obstructive pulmonary disease) (HCC) Not acutely exacerbated, but is at risk for exacerbation given Covid-19 infection. --Combivent  scheduled Q6H given Covid --PRN albuterol  HFA --Breo --Mucolytics, pulmonary hygiene and other symptomatic care per orders PRN  Anxiety and depression --Continue BuSpar  and Effexor    Dyslipidemia --Continue statin  HLD (hyperlipidemia) --Lipitor         Subjective: Pt awake sitting up in bed this AM.  She reports not feeling well and still sick.  Noted fevers overnight with Tmax 102.3.     Physical Exam: Vitals:   02/26/23 2127 02/26/23 2200 02/27/23 0848 02/27/23 1219  BP: 123/77 117/69 (!) 97/57 (!) 109/47  Pulse: 88 89 95 89  Resp: 20 16 16 16   Temp:  98.2 F (36.8 C) 98 F (36.7 C) 98.9 F (37.2 C)  TempSrc:  Oral  Oral  SpO2: 98% 94% 97% 100%  Weight:  102.8 kg    Height:  4' 9 (1.448 m)     General exam: awake, alert, no acute distress, obese, mildly ill appearing HEENT: moist mucus membranes, hearing grossly normal   Respiratory system: CTAB but generally diminished, no wheezes, rnormal respiratory effort. Cardiovascular system: normal S1/S2, RRR, no pedal edema.   Gastrointestinal system: soft, NT, ND, no HSM felt, +bowel sounds. Central nervous system: A&O, normal speech, grossly non-focal exam Extremities: slightly improved erythema of distal right leg sparing the foot, improved differential warmth on palpation Skin: dry, intact, normal temperature Psychiatry: normal mood, congruent affect, abnormal judgement and insight     Data Reviewed:  Notable labs ---  Na 133  K 3.3 Cl 97 Glucose 179 Cr 1.08 >> 1.53 >> 1.17 >> 0.94  Lactic acid 3.0 >> 2.0 >> 2.2 >> 0.8  WBC 13.8 >> 12.0  Micro Urine culture 1/9 -- more than 100k colonies GNR's  Blood cultures 1/9 -- neg to date x 2 days Covid-19 PCR 1/9 -- positive  RLE duplex U/S 1/10 -- negative for DVT   Family Communication: Daughter updated by phone this afternoon.  Disposition: Status is: Inpatient Remains inpatient appropriate because: severity of illness as above, ongoing sepsis physiology, remains on IV antibiotics pending cultures   Planned Discharge Destination: Home    Time spent: 45 minutes  Author: Burnard DELENA Cunning, DO 02/27/2023 1:43 PM  For on call review [email or web address].

## 2023-02-27 NOTE — Assessment & Plan Note (Signed)
 Lipitor

## 2023-02-27 NOTE — Consult Note (Signed)
 Pharmacy Antibiotic Note  ASSESSMENT: 63 y.o. female with PMH including obesity, diabetes, HTN, rUTI, arthritis is presenting with cellulitis. Pt with mild fever of 100, tachycardia, and leukocytosis. Pharmacy has been consulted to manage vancomycin  dosing.  She was diagnosed with COVID on 02/22/2023.   Patient measurements: Height: 4' 9 (144.8 cm) Weight: 102.8 kg (226 lb 10.1 oz) IBW/kg (Calculated) : 38.6  Vital signs: Temp: 98.2 F (36.8 C) (01/10 2200) Temp Source: Oral (01/10 2200) BP: 117/69 (01/10 2200) Pulse Rate: 89 (01/10 2200) Recent Labs  Lab 02/25/23 1716 02/26/23 0457 02/26/23 1312 02/27/23 0131  WBC 13.2* 13.8*  --  12.0*  CREATININE 1.08* 1.53* 1.17* 0.94   Estimated Creatinine Clearance: 61.4 mL/min (by C-G formula based on SCr of 0.94 mg/dL).  Allergies: Allergies  Allergen Reactions   Bee Venom Anaphylaxis and Shortness Of Breath    Stated by Patient   Shellfish Allergy Anaphylaxis and Shortness Of Breath    Stated by Patient Stated by Patient    Shellfish-Derived Products Anaphylaxis and Shortness Of Breath    Stated by Patient   Codeine    Thorazine [Chlorpromazine] Swelling   Tylenol  With Codeine #3 [Acetaminophen -Codeine] Other (See Comments)    Hallucinations   Metronidazole  Rash   Antimicrobials this admission: Ceftriaxone  1/9 >> Vancomycin  1/9 >>  Dose adjustments this admission: N/A  Microbiology results: 1/9 BCx: NGTD 1/9 BCx: In process   PLAN: Scr trending down to baseline. Will order maintenance dose of vancomycin  750 mg q24H. Predicted AUC 417. Goal 400-600. Plan to obtain vancomycin  level after 4th or 5th dose if vancomycin  continued.   Thank you for involving pharmacy in this patient's care.   Cathaleen Blanch, PharmD, BCPS Clinical Pharmacist 02/27/2023 7:37 AM

## 2023-02-27 NOTE — Assessment & Plan Note (Signed)
 Not acutely exacerbated, but is at risk for exacerbation given Covid-19 infection. --Combivent scheduled Q6H given Covid --PRN albuterol HFA --Breo --Mucolytics, pulmonary hygiene and other symptomatic care per orders PRN

## 2023-02-27 NOTE — Assessment & Plan Note (Addendum)
 Resolved.  Na 129 improved to 134 with sepsis IV fluids, consistent with hypovolemia. --Off IV fluids --Resume home Lasix  --Monitor BMP and volume status

## 2023-02-27 NOTE — Assessment & Plan Note (Signed)
 Pt reports she is "supposed to" be on 2 L/min oxygen at home.  Oxygen requirement is fairly stable, on 2-3 L/min. COPD not exacerbated, monitor closely. --O2 per protocol, maintain sats 88-93%

## 2023-02-27 NOTE — Assessment & Plan Note (Addendum)
 K 3.3 on 1/11 was replaced. --Monitor BMP, replace K as needed

## 2023-02-27 NOTE — Consult Note (Signed)
 WOC Nurse Consult Note: Reason for Consult: Left plantar foot wound.  Seen by podiatry 02/23/23.  Ordered mupirocin  for left foot. Right lower leg with erythema, cellulitis, improving.  Wound type: neuropathic wound left foot  infectious right lower leg Pressure Injury POA: NA Measurement:  LEft foot:  3 cm x 3 cm x 0.1 cm  Wound bed: ruddy red Drainage (amount, consistency, odor) minimal serosanguinous  no odor Periwound: Dry cracked skin to feet.  Dressing procedure/placement/frequency: Cleanse left plantar wound with NS and pat dry. Apply mupirocin  ointment and cover with dry dressing.  Right leg open to air, if weeping, may wrap with kerlix and ace wrap.  Will not follow at this time.  Please re-consult if needed.  Darice Cooley MSN, RN, FNP-BC CWON Wound, Ostomy, Continence Nurse Outpatient [facility] [phone] Pager [phone]

## 2023-02-27 NOTE — Assessment & Plan Note (Signed)
 Due to multiple acute infections and underlying COPD, deconditioning. Pt is from ALF --PT/OT evaluations --TOC consulted for [facility] or SNF arrangements

## 2023-02-28 DIAGNOSIS — L0390 Cellulitis, unspecified: Secondary | ICD-10-CM | POA: Diagnosis not present

## 2023-02-28 DIAGNOSIS — A419 Sepsis, unspecified organism: Secondary | ICD-10-CM | POA: Diagnosis not present

## 2023-02-28 LAB — BASIC METABOLIC PANEL
Anion gap: 7 (ref 5–15)
BUN: 12 mg/dL (ref 8–23)
CO2: 25 mmol/L (ref 22–32)
Calcium: 8.1 mg/dL — ABNORMAL LOW (ref 8.9–10.3)
Chloride: 99 mmol/L (ref 98–111)
Creatinine, Ser: 0.65 mg/dL (ref 0.44–1.00)
GFR, Estimated: >60 mL/min (ref >60)
Glucose, Bld: 150 mg/dL — ABNORMAL HIGH (ref 70–99)
Potassium: 3.9 mmol/L (ref 3.5–5.1)
Sodium: 131 mmol/L — ABNORMAL LOW (ref 135–145)

## 2023-02-28 LAB — CBC
HCT: 32.6 % — ABNORMAL LOW (ref 36.0–46.0)
Hemoglobin: 10.6 g/dL — ABNORMAL LOW (ref 12.0–15.0)
MCH: 27.2 pg (ref 26.0–34.0)
MCHC: 32.5 g/dL (ref 30.0–36.0)
MCV: 83.6 fL (ref 80.0–100.0)
Platelets: 150 10*3/uL (ref 150–400)
RBC: 3.9 MIL/uL (ref 3.87–5.11)
RDW: 14.8 % (ref 11.5–15.5)
WBC: 8 10*3/uL (ref 4.0–10.5)
nRBC: 0 % (ref 0.0–0.2)

## 2023-02-28 LAB — GLUCOSE, CAPILLARY
Glucose-Capillary: 117 mg/dL — ABNORMAL HIGH (ref 70–99)
Glucose-Capillary: 206 mg/dL — ABNORMAL HIGH (ref 70–99)
Glucose-Capillary: 114 mg/dL — ABNORMAL HIGH (ref 70–99)
Glucose-Capillary: 60 mg/dL — ABNORMAL LOW (ref 70–99)
Glucose-Capillary: 94 mg/dL (ref 70–99)

## 2023-02-28 LAB — URINE CULTURE: Culture: >=100000 — AB

## 2023-02-28 LAB — MAGNESIUM: Magnesium: 2 mg/dL (ref 1.7–2.4)

## 2023-02-28 NOTE — Evaluation (Signed)
 Occupational Therapy Evaluation Patient Details Name: Cristina [name] MRN: [id number] DOB: [birth year] Today's Date: 02/28/2023   History of Present Illness Cristina [name] is a 62yoF who [facility] from ALF with fever. Recent COVID 19 diagnosed on 02/22/23. Workup suggests sepsis 2/2 Left foot cellulitis, acute UTI. Pt seen in podiatry clinic 1/7 for tinea pedis open wounds, orders for dressings. PMH: COPD, CHF, DM, GERD, HTN, schizoaffective d/o, CVA, OSA, asthma, OA.   Clinical Impression   Cristina [name] was seen for OT evaluation this date. Prior to hospital admission, pt was MOD I using rollator. Pt lives at ALF. Pt currently requires CGA + RW for toilet t/f and don B slippers in sitting. SpO2 98% on RA. Pt would benefit from skilled OT to address noted impairments and functional limitations (see below for any additional details). Upon hospital discharge, recommend OT follow up.     If plan is discharge home, recommend the following: A little help with walking and/or transfers;A little help with bathing/dressing/bathroom;Supervision due to cognitive status;Help with stairs or ramp for entrance    Functional Status Assessment  Patient has had a recent decline in their functional status and demonstrates the ability to make significant improvements in function in a reasonable and predictable amount of time.  Equipment Recommendations  BSC/3in1    Recommendations for Other Services       Precautions / Restrictions Precautions Precautions: Fall Restrictions Weight Bearing Restrictions Per Provider Order: No Other Position/Activity Restrictions: should probably have her slippers on or somthing more than socks when OOB      Mobility Bed Mobility               General bed mobility comments: not tested    Transfers Overall transfer level: Needs assistance Equipment used: Rolling walker (2 wheels) Transfers: Sit to/from Stand Sit to Stand: Supervision                   Balance Overall balance assessment: Needs assistance Sitting-balance support: No upper extremity supported, Feet supported Sitting balance-[name]: Good     Standing balance support: Single extremity supported, During functional activity Standing balance-[name]: Fair                             ADL either performed or assessed with clinical judgement   ADL Overall ADL's : Needs assistance/impaired                                       General ADL Comments: CGA + RW for toielt t/f and don B slippers in sitting.      Pertinent Vitals/Pain Pain Assessment Pain Assessment: 0-10 Pain Score: 10-Worst pain ever Pain Location: LLE Pain Descriptors / Indicators: Dull, Grimacing, Discomfort Pain Intervention(s): Limited activity within patient's tolerance, Patient requesting pain meds-RN notified     Extremity/Trunk Assessment Upper Extremity Assessment Upper Extremity Assessment: Overall WFL for tasks assessed   Lower Extremity Assessment Lower Extremity Assessment: Generalized weakness       Communication Communication Communication: No apparent difficulties   Cognition Arousal: Alert Behavior During Therapy: WFL for tasks assessed/performed Overall Cognitive Status: Within Functional Limits for tasks assessed  General Comments  98% on RA with activity            Home Living Family/patient expects to be discharged to:: Assisted living                             Home Equipment: Rollator (4 wheels)   Additional Comments: [facility]- AMB modI with confidence but not far distances per her report.      Prior Functioning/Environment Prior Level of Function : Independent/Modified Independent             Mobility Comments: reports daughter takes her out to dinner once a week ADLs Comments: modI        OT Problem List: Decreased strength;Decreased  range of motion;Decreased activity tolerance;Impaired balance (sitting and/or standing)      OT Treatment/Interventions: Self-care/ADL training;Therapeutic exercise;Energy conservation;DME and/or AE instruction;Therapeutic activities;Patient/family education    OT Goals(Current goals can be found in the care plan section) Acute Rehab OT Goals Patient Stated Goal: to go home OT Goal Formulation: With patient Time For Goal Achievement: 03/14/23 Potential to Achieve Goals: Good ADL Goals Pt Will Perform Grooming: with modified independence;standing Pt Will Perform Lower Body Dressing: with modified independence;sit to/from stand Pt Will Transfer to Toilet: with modified independence;ambulating;regular height toilet  OT Frequency: Min 1X/week    Co-evaluation              AM-PAC OT 6 Clicks Daily Activity     Outcome Measure Help from another person eating meals?: None Help from another person taking care of personal grooming?: A Little Help from another person toileting, which includes using toliet, bedpan, or urinal?: A Little Help from another person bathing (including washing, rinsing, drying)?: A Little Help from another person to put on and taking off regular upper body clothing?: None Help from another person to put on and taking off regular lower body clothing?: A Little 6 Click Score: 20   End of Session Equipment Utilized During Treatment: Rolling walker (2 wheels) Nurse Communication: Patient requests pain meds  Activity Tolerance: Patient tolerated treatment well Patient left: in chair;with call bell/phone within reach;with nursing/sitter in room  OT Visit Diagnosis: Other abnormalities of gait and mobility (R26.89);Muscle weakness (generalized) (M62.81)                Time: 8883-8868 OT Time Calculation (min): 15 min Charges:  OT General Charges $OT Visit: 1 Visit OT Evaluation $OT Eval Moderate Complexity: 1 Mod  Elston Slot, M.S. OTR/L  02/28/23, 12:54  PM  ascom [phone]

## 2023-02-28 NOTE — Evaluation (Signed)
 Physical Therapy Evaluation Patient Details Name: Cristina [name] MRN: [id number] DOB: [birth year] Today's Date: 02/28/2023  History of Present Illness  Cristina [name] is a 62yoF who comes [facility] on 02/25/23 from ALF with fever. Recent COVID 19 diagnosed on 02/22/23. Workup suggests sepsis 2/2 Left foot cellulitis, acute UTI. Pt seen in podiatry clinic 1/7 for tinea pedis open wounds, orders for dressings. PMH: COPD, CHF, DM, GERD, HTN, schizoaffective d/o, CVA, OSA, asthma, OA.  Clinical Impression  Pt in bed on entry, agreeable to session- noted purewick suction error with incontinence into bed. Pt given ModA to EOB; Author assists pt with gown change and bed linen change. Pt performs several transfers in session c RW, No cues needed for safe, controlled performance, no LOB seen, however pt has subjective acute weakness. AMB not aggressively pursued at time of eval due to limited footwear options at that time, also unclear if left foot should be protected from floor prior to extensive gait overground. Pt un to chair at end of session, all needs met.       If plan is discharge home, recommend the following: A little help with walking and/or transfers;A little help with bathing/dressing/bathroom;Assist for transportation   Can travel by private vehicle        Equipment Recommendations None recommended by PT  Recommendations for Other Services       Functional Status Assessment Patient has had a recent decline in their functional status and demonstrates the ability to make significant improvements in function in a reasonable and predictable amount of time.     Precautions / Restrictions Precautions Precautions: Fall Restrictions Weight Bearing Restrictions Per Provider Order: No Other Position/Activity Restrictions: should probably have her slippers on or somthing more than socks when OOB      Mobility  Bed Mobility Overal bed mobility: Needs Assistance Bed Mobility: Supine to Sit      Supine to sit: Mod assist, HOB elevated          Transfers Overall transfer level: Needs assistance Equipment used: Rolling walker (2 wheels) Transfers: Sit to/from Stand, Bed to chair/wheelchair/BSC Sit to Stand: Supervision   Step pivot transfers: Supervision       General transfer comment: bed to recliner to [facility] to recliner    Ambulation/Gait Ambulation/Gait assistance:  (deferred due to lack of footwear; later found slippers at end of session)                Stairs            Wheelchair Mobility     Tilt Bed    Modified Rankin (Stroke Patients Only)       Balance Overall balance assessment: Modified Independent                                           Pertinent Vitals/Pain Pain Assessment Pain Assessment: 0-10 (says pain meds mustve helped because LLE no longer hurts.) Pain Score: 0-No pain    Home Living Family/patient expects to be discharged to:: Assisted living                 Home Equipment: Rollator (4 wheels) Additional Comments: [facility]- AMB modI with confidence but not far distances per her report.    Prior Function Prior Level of Function : Independent/Modified Independent             Mobility Comments: uses  a rollter in facitilty to get to bathroom, dining room ADLs Comments: modI     Extremity/Trunk Assessment   Upper Extremity Assessment Upper Extremity Assessment: Generalized weakness    Lower Extremity Assessment Lower Extremity Assessment: Generalized weakness       Communication      Cognition Arousal: Alert Behavior During Therapy: WFL for tasks assessed/performed Overall Cognitive Status: Within Functional Limits for tasks assessed                                 General Comments: suspect some mild intellectual disability but generally [facility]        General Comments      Exercises     Assessment/Plan    PT Assessment Patient needs continued PT  services  PT Problem List Decreased strength;Decreased knowledge of use of DME;Decreased safety awareness;Decreased knowledge of precautions;Decreased coordination       PT Treatment Interventions DME instruction;Patient/family education;Gait training;Functional mobility training;Therapeutic activities;Therapeutic exercise;Balance training;Neuromuscular re-education    PT Goals (Current goals can be found in the Care Plan section)  Acute Rehab PT Goals Patient Stated Goal: hope I don't have to work with PT again, I just finished with them not long ago PT Goal Formulation: With patient Time For Goal Achievement: 03/14/23 Potential to Achieve Goals: Fair    Frequency Min 1X/week     Co-evaluation               AM-PAC PT 6 Clicks Mobility  Outcome Measure Help needed turning from your back to your side while in a flat bed without using bedrails?: A Lot Help needed moving from lying on your back to sitting on the side of a flat bed without using bedrails?: A Lot Help needed moving to and from a bed to a chair (including a wheelchair)?: A Little Help needed standing up from a chair using your arms (e.g., wheelchair or bedside chair)?: A Little Help needed to walk in hospital room?: A Little Help needed climbing 3-5 steps with a railing? : A Little 6 Click Score: 16    End of Session   Activity Tolerance: Patient tolerated treatment well;No increased pain;Patient limited by fatigue Patient left: in chair;with call bell/phone within reach Nurse Communication: Mobility status PT Visit Diagnosis: Difficulty in walking, not elsewhere classified (R26.2);Muscle weakness (generalized) (M62.81)    Time: 8990-8950 PT Time Calculation (min) (ACUTE ONLY): 40 min   Charges:   PT Evaluation $PT Eval Moderate Complexity: 1 Mod PT Treatments $Therapeutic Activity: 8-22 mins PT General Charges $$ ACUTE PT VISIT: 1 Visit        11:36 AM, 02/28/23 Peggye JAYSON Linear, PT,  DPT Physical Therapist - [facility]  [phone] (ASCOM)    Haidy Kackley C 02/28/2023, 11:33 AM

## 2023-02-28 NOTE — Assessment & Plan Note (Signed)
Lyrica

## 2023-02-28 NOTE — Plan of Care (Signed)
  Problem: Education: Goal: Ability to describe self-care measures that may prevent or decrease complications (Diabetes Survival Skills Education) will improve Outcome: Progressing Goal: Individualized Educational Video(s) Outcome: Progressing   Problem: Coping: Goal: Ability to adjust to condition or change in health will improve Outcome: Progressing   Problem: Fluid Volume: Goal: Ability to maintain a balanced intake and output will improve Outcome: Progressing   Problem: Health Behavior/Discharge Planning: Goal: Ability to identify and utilize available resources and services will improve Outcome: Progressing Goal: Ability to manage health-related needs will improve Outcome: Progressing   Problem: Metabolic: Goal: Ability to maintain appropriate glucose levels will improve Outcome: Progressing   Problem: Nutritional: Goal: Maintenance of adequate nutrition will improve Outcome: Progressing Goal: Progress toward achieving an optimal weight will improve Outcome: Progressing   Problem: Skin Integrity: Goal: Risk for impaired skin integrity will decrease Outcome: Progressing   Problem: Tissue Perfusion: Goal: Adequacy of tissue perfusion will improve Outcome: Progressing   Problem: Education: Goal: Knowledge of risk factors and measures for prevention of condition will improve Outcome: Progressing   Problem: Coping: Goal: Psychosocial and spiritual needs will be supported Outcome: Progressing   Problem: Respiratory: Goal: Will maintain a patent airway Outcome: Progressing Goal: Complications related to the disease process, condition or treatment will be avoided or minimized Outcome: Progressing   Problem: Fluid Volume: Goal: Hemodynamic stability will improve Outcome: Progressing   Problem: Clinical Measurements: Goal: Diagnostic test results will improve Outcome: Progressing Goal: Signs and symptoms of infection will decrease Outcome: Progressing    Problem: Respiratory: Goal: Ability to maintain adequate ventilation will improve Outcome: Progressing   Problem: Education: Goal: Knowledge of General Education information will improve Description: Including pain rating scale, medication(s)/side effects and non-pharmacologic comfort measures Outcome: Progressing   Problem: Health Behavior/Discharge Planning: Goal: Ability to manage health-related needs will improve Outcome: Progressing   Problem: Clinical Measurements: Goal: Ability to maintain clinical measurements within normal limits will improve Outcome: Progressing Goal: Will remain free from infection Outcome: Progressing Goal: Diagnostic test results will improve Outcome: Progressing Goal: Respiratory complications will improve Outcome: Progressing Goal: Cardiovascular complication will be avoided Outcome: Progressing   Problem: Activity: Goal: Risk for activity intolerance will decrease Outcome: Progressing   Problem: Nutrition: Goal: Adequate nutrition will be maintained Outcome: Progressing   Problem: Coping: Goal: Level of anxiety will decrease Outcome: Progressing   Problem: Elimination: Goal: Will not experience complications related to bowel motility Outcome: Progressing Goal: Will not experience complications related to urinary retention Outcome: Progressing   Problem: Pain Management: Goal: General experience of comfort will improve Outcome: Progressing   Problem: Safety: Goal: Ability to remain free from injury will improve Outcome: Progressing   Problem: Skin Integrity: Goal: Risk for impaired skin integrity will decrease Outcome: Progressing

## 2023-02-28 NOTE — Progress Notes (Signed)
 Pt refused wound care. RN Physiological scientist.

## 2023-02-28 NOTE — Progress Notes (Signed)
 Progress Note   Patient: Cristina [name] FMW:[id number] DOB: [birth year] DOA: 02/25/2023     3 DOS: the patient was seen and examined on 02/28/2023   Brief hospital course:  HPI on admission:  Cristina [name] is a 63 y.o. Caucasian female with medical history significant for COPD, CHF, type diabetes mellitus, GERD, hypertension and dyslipidemia, schizoaffective disorder, CVA, OSA, asthma and osteoarthritis, who presented to the emergency room with acute onset of fever with recent COVID 19 diagnosed on Monday.  She has been having fever and mild chills as well as nausea without vomiting or diarrhea.  She admitted to dry cough without wheezing or dyspnea.  Over the last week she has been developing left foot and leg swelling with erythema, warmth, tenderness and pain    See H&P for full HPI on admission & ED course.  Pt was admitted meeting severe sepsis criteria with fever, tachycardia, leukocytosis lactic acidosis in the setting of right lower extremity cellulitis and Covid-19 infection.  Pt was started on IV antibiotics and given IV fluids per sepsis protocol.    Assessment and Plan: * Sepsis due to cellulitis (HCC) Severe sepsis POA with fever, tachycardia, and leukocytosis, lactic acidosis consistent with organ dysfunction.  Sepsis physiology improving, pt still febrile overnight. Lactate normalized. Right lower extremity cellulitis  1/10 -- U/S RLE negative for DVT 1/11 -- erythema improving 1/12 -- increased erythema warmth and swelling again Acute lower UTI. 1/11 -- Urine culture growing over 100k gram negative rods 1/12 -- Urine cx E coli resistant to only Cipro  --On IV Rocephin  for UTI - end after today to complete course --Add back IV Vancomycin  for cellulitis --Follow blood and urine cultures --Monitor fever curve, CBC, hemodynamics  Hyponatremia Na 129 improved to 134 with sepsis IV fluids, consistent with hypovolemia. --Off IV fluids --Lasix  was held, will hold for  today --Monitor BMP and volume status  Hypokalemia K 3.3 on 1/11 was replaced. --Monitor BMP, replace K as needed  COVID-19 virus infection Reportedly diagnosed on 02/22/23 --Airborne and contact isolation --Symptomatic care PRN per orders  Generalized weakness Due to multiple acute infections and underlying COPD, deconditioning. Pt is from ALF --PT/OT evaluations --TOC consulted for [facility] or SNF arrangements  Chronic respiratory failure with hypoxia (HCC) Pt reports she is supposed to be on 2 L/min oxygen at home.  Oxygen requirement is fairly stable, on 2-3 L/min. COPD not exacerbated, monitor closely. --O2 per protocol, maintain sats 88-93%  Essential hypertension --Continue home lisinopril , metoprolol  --Hold home Lasix  while on IV fluids for lactic acidosis  Type 2 diabetes mellitus without complications (HCC) --Sliding scale Novolog  --Continue home Januvia (Tradjenta  formulary substitute) and glipizide  --Hold metformin  --Hypoglycemia protocol --Titrate regimen for inpatient goal 140-180  COPD (chronic obstructive pulmonary disease) (HCC) Not acutely exacerbated, but is at risk for exacerbation given Covid-19 infection. --Combivent  scheduled Q6H given Covid --PRN albuterol  HFA --Breo --Mucolytics, pulmonary hygiene and other symptomatic care per orders PRN  Anxiety and depression --Continue home BuSpar  and Effexor , Klonopin  at bedtime  Dyslipidemia --Continue statin  HLD (hyperlipidemia) --Lipitor   Polyneuropathy --Lyrica         Subjective: Pt up in recliner this AM.  She reports right leg pain worse today.  No fever or chills.  No chest pain or dyspnea at rest.       Physical Exam: Vitals:   02/27/23 1219 02/27/23 1615 02/27/23 2140 02/28/23 0935  BP: (!) 109/47 110/63 112/70 (!) 112/56  Pulse: 89 87 (!) 101 93  Resp: 16 15  20 20  Temp: 98.9 F (37.2 C) 98 F (36.7 C) 98.9 F (37.2 C) (!) 97.5 F (36.4 C)  TempSrc: Oral   Oral  SpO2: 100%  100% 99% 99%  Weight:      Height:       General exam: awake, alert, no acute distress, obese, mildly ill appearing HEENT: edentulous, moist mucus membranes, hearing grossly normal  Respiratory system: CTAB but generally diminished, no wheezes, rnormal respiratory effort. Cardiovascular system: normal S1/S2, RRR, no pedal edema.   Gastrointestinal system: soft, NT, ND, no HSM felt, +bowel sounds. Central nervous system: A&O, normal speech, grossly non-focal exam Extremities: increased erythema and swelling of distal LEFT leg with increased warmth on palpation compared to yesterday Skin: dry, intact, normal temperature Psychiatry: normal mood, congruent affect    Data Reviewed:  Notable labs ---  Na 133 >> 131 Glucose 150 Cr 1.08 >> 1.53 >> 1.17 >> 0.94 >> 0.65  WBC 13.8 >> 12.0 >> 8.0  Micro Urine culture 1/9 -- more than 100k colonies GNR's  Blood cultures 1/9 -- neg to date x 2 days Covid-19 PCR 1/9 -- positive  RLE duplex U/S 1/10 -- negative for DVT   Family Communication: Daughter updated by phone 1/11 afternoon.  Disposition: Status is: Inpatient Remains inpatient appropriate because: severity of illness as above, remains on IV antibiotics pending cultures   Planned Discharge Destination: Home    Time spent: 45 minutes  Author: Burnard DELENA Cunning, DO 02/28/2023 2:38 PM  For on call review [email or web address].

## 2023-03-01 ENCOUNTER — Encounter: Payer: Self-pay | Admitting: Hematology and Oncology

## 2023-03-01 ENCOUNTER — Other Ambulatory Visit (HOSPITAL_COMMUNITY): Payer: Self-pay

## 2023-03-01 ENCOUNTER — Telehealth (HOSPITAL_COMMUNITY): Payer: Self-pay | Admitting: Pharmacy Technician

## 2023-03-01 DIAGNOSIS — A419 Sepsis, unspecified organism: Secondary | ICD-10-CM | POA: Diagnosis not present

## 2023-03-01 DIAGNOSIS — L0390 Cellulitis, unspecified: Secondary | ICD-10-CM | POA: Diagnosis not present

## 2023-03-01 LAB — BASIC METABOLIC PANEL
Anion gap: 8 (ref 5–15)
BUN: 12 mg/dL (ref 8–23)
CO2: 29 mmol/L (ref 22–32)
Calcium: 8.5 mg/dL — ABNORMAL LOW (ref 8.9–10.3)
Chloride: 101 mmol/L (ref 98–111)
Creatinine, Ser: 0.78 mg/dL (ref 0.44–1.00)
GFR, Estimated: >60 mL/min (ref >60)
Glucose, Bld: 154 mg/dL — ABNORMAL HIGH (ref 70–99)
Potassium: 4.1 mmol/L (ref 3.5–5.1)
Sodium: 136 mmol/L (ref 135–145)

## 2023-03-01 LAB — GLUCOSE, CAPILLARY
Glucose-Capillary: 142 mg/dL — ABNORMAL HIGH (ref 70–99)
Glucose-Capillary: 241 mg/dL — ABNORMAL HIGH (ref 70–99)
Glucose-Capillary: 170 mg/dL — ABNORMAL HIGH (ref 70–99)
Glucose-Capillary: 167 mg/dL — ABNORMAL HIGH (ref 70–99)

## 2023-03-01 MED ORDER — GLIPIZIDE 5 MG PO TABS
5.0000 mg | ORAL_TABLET | Freq: Two times a day (BID) | ORAL | Status: DC
  Administered 2023-03-01 – 2023-03-02 (×3): 5 mg via ORAL
  Filled 2023-03-01 (×4): qty 1

## 2023-03-01 MED ORDER — VANCOMYCIN HCL IN DEXTROSE 1-5 GM/200ML-% IV SOLN
1000.0000 mg | INTRAVENOUS | Status: DC
  Administered 2023-03-01 – 2023-03-02 (×2): 1000 mg via INTRAVENOUS
  Filled 2023-03-01 (×2): qty 200

## 2023-03-01 MED ORDER — FUROSEMIDE 40 MG PO TABS
40.0000 mg | ORAL_TABLET | Freq: Every day | ORAL | Status: DC
  Administered 2023-03-01 – 2023-03-02 (×2): 40 mg via ORAL
  Filled 2023-03-01 (×2): qty 1

## 2023-03-01 NOTE — TOC Progression Note (Signed)
 Transition of Care [facility]) - Progression Note    Patient Details  Name: Cristina [name] MRN: [id number] Date of Birth: 09-12-1960  [facility]) CM/SW Contact  Royanne JINNY Bernheim, RN Phone Number: 03/01/2023, 9:51 AM  Clinical Narrative:     Spoke with Tammy at [facility]  She stated that the patient can come back when medically ready She will have HH thru [facility] and will need orders for it  She does not have to wait the quarantine period for Covid [facility] will pick up and transport at [place], they will need an updated FL2       Expected Discharge Plan and Services                                               Social Determinants of Health (SDOH) Interventions SDOH Screenings   Food Insecurity: No Food Insecurity (02/26/2023)  Housing: Unknown (02/26/2023)  Transportation Needs: No Transportation Needs (02/26/2023)  Utilities: Not At Risk (02/26/2023)  Tobacco Use: High Risk (02/25/2023)    Readmission Risk Interventions     No data to display

## 2023-03-01 NOTE — Progress Notes (Signed)
 Mupirocin ointment applied to L foot, dressing changed- gauze, kerlix, ace wrap from toes to calf, pt tolerated well, analgesic given, call bell in reach, will monitor

## 2023-03-01 NOTE — Assessment & Plan Note (Signed)
 Appears not using CPAP O2 per protocol

## 2023-03-01 NOTE — Telephone Encounter (Signed)
 Patient Product/process Development Scientist completed.    The patient is insured through [employer]. Patient has Medicare and is not eligible for a copay card, but may be able to apply for patient assistance or Medicare RX Payment Plan (Patient Must reach out to their plan, if eligible for payment plan), if available.    Ran test claim for linezolid (Zyvox) 600 mg and the current 6 day co-pay is $1.60.   This test claim was processed through [facility]- copay amounts may vary at other pharmacies due to pharmacy/plan contracts, or as the patient moves through the different stages of their insurance plan.     Reyes Sharps, CPHT Pharmacy Technician III Certified Patient Advocate [facility] Patient Advocate Team Direct Number: [phone]  Fax: [phone]

## 2023-03-01 NOTE — Plan of Care (Signed)
  Problem: Fluid Volume: Goal: Ability to maintain a balanced intake and output will improve Outcome: Progressing   Problem: Nutritional: Goal: Maintenance of adequate nutrition will improve Outcome: Progressing Goal: Progress toward achieving an optimal weight will improve Outcome: Progressing

## 2023-03-01 NOTE — Consult Note (Signed)
 Pharmacy Antibiotic Note  ASSESSMENT: 63 y.o. female with PMH including obesity, diabetes, HTN, rUTI, arthritis is presenting with cellulitis. Pt with mild fever of 100, tachycardia, and leukocytosis. Pharmacy has been consulted to manage vancomycin  dosing.  She was diagnosed with COVID on 02/22/2023.  Scr now 0.71 (Improved).  Patient measurements: Height: 4' 9 (144.8 cm) Weight: 102.8 kg (226 lb 10.1 oz) IBW/kg (Calculated) : 38.6  Vital signs: Temp: 97.5 F (36.4 C) (01/12 2206) Temp Source: Oral (01/12 2206) BP: 114/60 (01/12 2206) Pulse Rate: 74 (01/12 2206) Recent Labs  Lab 02/26/23 0457 02/26/23 1312 02/27/23 0131 02/28/23 0340 03/01/23 0403  WBC 13.8*  --  12.0* 8.0  --   CREATININE 1.53*   < > 0.94 0.65 0.78   < > = values in this interval not displayed.   Estimated Creatinine Clearance: 72.1 mL/min (by C-G formula based on SCr of 0.78 mg/dL).  Allergies: Allergies  Allergen Reactions   Bee Venom Anaphylaxis and Shortness Of Breath    Stated by Patient   Shellfish Allergy Anaphylaxis and Shortness Of Breath    Stated by Patient Stated by Patient    Shellfish-Derived Products Anaphylaxis and Shortness Of Breath    Stated by Patient   Codeine    Thorazine [Chlorpromazine] Swelling   Tylenol  With Codeine #3 [Acetaminophen -Codeine] Other (See Comments)    Hallucinations   Metronidazole  Rash   Antimicrobials this admission: Ceftriaxone  1/9 >>1/12 Vancomycin  1/9 >>  Microbiology results: 1/9 bcx: ngtd 1/9 ucx: E. Coli (R = Cipro )  PLAN: Change vancomycin  to 1000 mg IV every 24 hours Estimated AUC 842, Cmin 12.4 IBW, Scr rounded up to 0.8, Vd 0.5 (BMI 49) Vancomycin  level at steady sate or as clinically indicated Follow renal function and cultures for adjustments  Thank you for involving pharmacy in this patient's care.   Kayla Mose Niels DOUGLAS, PharmD Clinical Pharmacist 03/01/2023 8:35 AM

## 2023-03-01 NOTE — Plan of Care (Signed)
  Problem: Education: Goal: Ability to describe self-care measures that may prevent or decrease complications (Diabetes Survival Skills Education) will improve Outcome: Progressing Goal: Individualized Educational Video(s) Outcome: Progressing   Problem: Coping: Goal: Ability to adjust to condition or change in health will improve Outcome: Progressing   Problem: Nutritional: Goal: Maintenance of adequate nutrition will improve Outcome: Progressing Goal: Progress toward achieving an optimal weight will improve Outcome: Progressing   Problem: Activity: Goal: Risk for activity intolerance will decrease Outcome: Progressing   Problem: Pain Management: Goal: General experience of comfort will improve Outcome: Progressing

## 2023-03-01 NOTE — Progress Notes (Signed)
 Physical Therapy Treatment Patient Details Name: Cristina [name] MRN: [id number] DOB: [birth year] Today's Date: 03/01/2023   History of Present Illness Daejah Klebba is a 62yoF who comes to [facility] on 02/25/23 from ALF with fever. Recent COVID 19 diagnosed on 02/22/23. Workup suggests sepsis 2/2 Left foot cellulitis, acute UTI. Pt seen in podiatry clinic 1/7 for tinea pedis open wounds, orders for dressings. PMH: COPD, CHF, DM, GERD, HTN, schizoaffective d/o, CVA, OSA, asthma, OA.    PT Comments  Therapist responded to bed alarm; noted patient to have transferred self from bed/BSC (no socks, tangled in IV/purewick).  Assisted in re-organizing room environment/set up, donning socks (dep assist), hygiene after continent bowel/bladder; assisted with transfer to recliner, min assist +1 with RW.  Frequent cuing for overall safety awareness.  Per discussion with attending, patient okay to mobilize with grip socks/bedroom slippers; no additional protective footwear required.    If plan is discharge home, recommend the following: A little help with walking and/or transfers;A little help with bathing/dressing/bathroom;Assist for transportation   Can travel by private vehicle        Equipment Recommendations  None recommended by PT    Recommendations for Other Services       Precautions / Restrictions Precautions Precautions: Fall Restrictions Weight Bearing Restrictions Per Provider Order: No     Mobility  Bed Mobility               General bed mobility comments: seated on BSC upon arrival to room; in recliner end of session    Transfers Overall transfer level: Needs assistance Equipment used: Rolling walker (2 wheels) Transfers: Sit to/from Stand, Bed to chair/wheelchair/BSC Sit to Stand: Contact guard assist, Min assist Stand pivot transfers: Contact guard assist, Min assist              Ambulation/Gait               General Gait Details: deferred this  session   Stairs             Wheelchair Mobility     Tilt Bed    Modified Rankin (Stroke Patients Only)       Balance Overall balance assessment: Needs assistance Sitting-balance support: No upper extremity supported, Feet supported Sitting balance-[name]: Good     Standing balance support: Bilateral upper extremity supported Standing balance-[name]: Fair                              Cognition Arousal: Alert Behavior During Therapy: WFL for tasks assessed/performed Overall Cognitive Status: No family/caregiver present to determine baseline cognitive functioning                                          Exercises Other Exercises Other Exercises: Sit/stand from [facility] with RW, min assist; standing balance for hygiene with RW, cga/min assist (does require UE support); hygiene, dep    General Comments        Pertinent Vitals/Pain Pain Assessment Pain Assessment: No/denies pain    Home Living                          Prior Function            PT Goals (current goals can now be found in the care plan section) Acute Rehab PT  Goals Patient Stated Goal: hope I don't have to work with PT again, I just finished with them not long ago PT Goal Formulation: With patient Potential to Achieve Goals: Fair Progress towards PT goals: Progressing toward goals    Frequency    Min 1X/week      PT Plan      Co-evaluation              AM-PAC PT 6 Clicks Mobility   Outcome Measure  Help needed turning from your back to your side while in a flat bed without using bedrails?: A Lot Help needed moving from lying on your back to sitting on the side of a flat bed without using bedrails?: A Lot Help needed moving to and from a bed to a chair (including a wheelchair)?: A Little Help needed standing up from a chair using your arms (e.g., wheelchair or bedside chair)?: A Little Help needed to walk in hospital room?:  A Little Help needed climbing 3-5 steps with a railing? : A Little 6 Click Score: 16    End of Session Equipment Utilized During Treatment: Gait belt Activity Tolerance: Patient tolerated treatment well;No increased pain;Patient limited by fatigue Patient left: in chair;with call bell/phone within reach Nurse Communication: Mobility status PT Visit Diagnosis: Difficulty in walking, not elsewhere classified (R26.2);Muscle weakness (generalized) (M62.81)     Time: 8979-8956 PT Time Calculation (min) (ACUTE ONLY): 23 min  Charges:    $Therapeutic Activity: 23-37 mins PT General Charges $$ ACUTE PT VISIT: 1 Visit                    Cyanna Neace H. Delores, PT, DPT, NCS 03/01/23, 4:57 PM [phone]

## 2023-03-01 NOTE — Inpatient Diabetes Management (Signed)
 Inpatient Diabetes Program Recommendations  AACE/ADA: New Consensus Statement on Inpatient Glycemic Control   Target Ranges:  Prepandial:   less than 140 mg/dL      Peak postprandial:   less than 180 mg/dL (1-2 hours)      Critically ill patients:  140 - 180 mg/dL    Latest Reference Range & Units 03/01/23 04:03  Glucose 70 - 99 mg/dL 845 (H)    Latest Reference Range & Units 02/28/23 09:36 02/28/23 12:43 02/28/23 17:26 02/28/23 21:24 02/28/23 22:18  Glucose-Capillary 70 - 99 mg/dL 882 (H) 793 (H) 885 (H) 60 (L) 94   Review of Glycemic Control  Diabetes history: DM2 Outpatient Diabetes medications: Glipizide  7.5 mg BID, Januvia 100 mg daily Current orders for Inpatient glycemic control: Novolog  0-15 units TID with meals, Novolog  0-5 units at bedtime, Glipizide  7.5 mg BID, Tradjenta  5 mg daily  Inpatient Diabetes Program Recommendations:    Oral DM medication: CBG down to 60 mg/dl at 78:75 on 8/87/74.  Please decrease Glipizide  to 5 mg BID.  Thanks, Earnie Gainer, RN, MSN, CDCES Diabetes Coordinator Inpatient Diabetes Program [phone] (Team Pager from 8am to 5pm)

## 2023-03-01 NOTE — Progress Notes (Signed)
 Progress Note   Patient: Cristina [name] FMW:[id number] DOB: 28-Dec-1960 DOA: 02/25/2023     4 DOS: the patient was seen and examined on 03/01/2023   Brief hospital course:  HPI on admission:  Cristina [name] is a 63 y.o. Caucasian female with medical history significant for COPD, CHF, type diabetes mellitus, GERD, hypertension and dyslipidemia, schizoaffective disorder, CVA, OSA, asthma and osteoarthritis, who presented to the emergency room with acute onset of fever with recent COVID 19 diagnosed on Monday.  She has been having fever and mild chills as well as nausea without vomiting or diarrhea.  She admitted to dry cough without wheezing or dyspnea.  Over the last week she has been developing left foot and leg swelling with erythema, warmth, tenderness and pain    See H&P for full HPI on admission & ED course.  Pt was admitted meeting severe sepsis criteria with fever, tachycardia, leukocytosis lactic acidosis in the setting of right lower extremity cellulitis and Covid-19 infection.  Pt was started on IV antibiotics and given IV fluids per sepsis protocol.    Assessment and Plan: * Sepsis due to cellulitis (HCC) Severe sepsis POA with fever, tachycardia, and leukocytosis, lactic acidosis consistent with organ dysfunction.  Sepsis physiology resolved.  Lactate normalized.   Hemodynamically stable. Right lower extremity cellulitis  1/10 -- U/S RLE negative for DVT 1/11 -- erythema improving 1/12 -- increased erythema warmth and swelling again Acute lower UTI. 1/11 -- Urine culture growing over 100k gram negative rods 1/12 -- Urine cx E coli resistant to only Cipro  --Completed IV Rocephin  for UTI  --Continue IV Vancomycin  for cellulitis --Consider linezolid for anti-toxin properties in next 24 hours if not showing improvement in erythema --Follow blood and urine cultures --Monitor fever curve, CBC  Hyponatremia Resolved.  Na 129 improved to 134 with sepsis IV fluids, consistent with  hypovolemia. --Off IV fluids --Resume home Lasix   --Monitor BMP and volume status  Hypokalemia K 3.3 on 1/11 was replaced. --Monitor BMP, replace K as needed  COVID-19 virus infection Reportedly diagnosed on 02/22/23 --Airborne and contact isolation --Symptomatic care PRN per orders  Generalized weakness Due to multiple acute infections and underlying COPD, deconditioning. Pt is from ALF --PT/OT evaluations --TOC consulted for [facility] or SNF arrangements  Chronic respiratory failure with hypoxia (HCC) Pt reports she is supposed to be on 2 L/min oxygen at home.  Oxygen requirement is fairly stable, on 2-3 L/min. COPD not exacerbated, monitor closely. --O2 per protocol, maintain sats 88-93%  Essential hypertension --Continue home lisinopril , metoprolol  --Hold home Lasix  while on IV fluids for lactic acidosis  Type 2 diabetes mellitus without complications (HCC) --Sliding scale Novolog  --Continue home Januvia (Tradjenta  formulary substitute) and glipizide  --Hold metformin  --Hypoglycemia protocol --Titrate regimen for inpatient goal 140-180  COPD (chronic obstructive pulmonary disease) (HCC) Not acutely exacerbated, but is at risk for exacerbation given Covid-19 infection. --Combivent  scheduled Q6H given Covid --PRN albuterol  HFA --Breo --Mucolytics, pulmonary hygiene and other symptomatic care per orders PRN  Anxiety and depression --Continue home BuSpar  and Effexor , Klonopin  at bedtime --If d/c on Zyvox, need to monitor for serotonin syndrome  Dyslipidemia --Continue statin  HLD (hyperlipidemia) --Lipitor   Polyneuropathy --Lyrica   Sleep apnea Appears not using CPAP O2 per protocol        Subjective: Pt up in recliner this AM when seen, requesting assistance to bathroom.  She reports ongoing right leg pain.  No fever or chills.  No other complaints or acute events reported.   Physical Exam: Vitals:  02/28/23 1807 02/28/23 2206 03/01/23 0915 03/01/23  1647  BP: (!) 106/57 114/60 118/71 110/69  Pulse: 67 74 94 84  Resp: 18 17 17 18   Temp: (!) 97.3 F (36.3 C) (!) 97.5 F (36.4 C) 97.6 F (36.4 C) (!) 97.4 F (36.3 C)  TempSrc: Oral Oral  Oral  SpO2: 100% 98% 98% 100%  Weight:      Height:       General exam: awake, alert, no acute distress, obese, chornically ill appearing HEENT: edentulous, moist mucus membranes, hearing grossly normal  Respiratory system: CTAB but generally diminished, no wheezes, rnormal respiratory effort. Cardiovascular system: normal S1/S2, RRR, no pedal edema.   Gastrointestinal system: soft, NT, ND Central nervous system: A&O, normal speech, grossly non-focal exam Extremities: right distal lower extremity wrapped, some erythema proximal to the dressing with tenderness on palpation Skin: dry, intact, normal temperature Psychiatry: normal mood, congruent affect    Data Reviewed:  Notable labs ---  Na 133 >> 131 >> 136 Glucose 154 Cr 1.08 >> 1.53 >> 1.17 >> 0.94 >> 0.65 >> 0.78  Last CBC with  WBC 13.8 >> 12.0 >> 8.0 normalized Hbg stable 10.6  Cbg's labile -- hypoglycemic 60 last night 60 >> 94 >> 142 >> 241 >> 170  Micro Urine culture 1/9 -- more than 100k colonies GNR's  Blood cultures 1/9 -- neg to date x 2 days Covid-19 PCR 1/9 -- positive  RLE duplex U/S 1/10 -- negative for DVT   Family Communication: Daughter updated by phone 1/11 afternoon.  None at bedside will attempt to call as time allows.   Disposition: Status is: Inpatient Remains inpatient appropriate because: severity of illness as above, remains on IV antibiotics pending cultures   Planned Discharge Destination: Home with [facility]    Time spent: 42 minutes  Author: Burnard DELENA Cunning, DO 03/01/2023 5:22 PM  For on call review [email or web address].

## 2023-03-01 NOTE — Progress Notes (Signed)
 Placed in pts chart so that RN could give it to pt.

## 2023-03-02 ENCOUNTER — Encounter: Payer: Self-pay | Admitting: Family Medicine

## 2023-03-02 DIAGNOSIS — A419 Sepsis, unspecified organism: Secondary | ICD-10-CM | POA: Diagnosis not present

## 2023-03-02 DIAGNOSIS — L0390 Cellulitis, unspecified: Secondary | ICD-10-CM | POA: Diagnosis not present

## 2023-03-02 LAB — BASIC METABOLIC PANEL
Anion gap: 6 (ref 5–15)
BUN: 8 mg/dL (ref 8–23)
CO2: 28 mmol/L (ref 22–32)
Calcium: 8.4 mg/dL — ABNORMAL LOW (ref 8.9–10.3)
Chloride: 102 mmol/L (ref 98–111)
Creatinine, Ser: 0.61 mg/dL (ref 0.44–1.00)
GFR, Estimated: >60 mL/min (ref >60)
Glucose, Bld: 182 mg/dL — ABNORMAL HIGH (ref 70–99)
Potassium: 4.2 mmol/L (ref 3.5–5.1)
Sodium: 136 mmol/L (ref 135–145)

## 2023-03-02 LAB — GLUCOSE, CAPILLARY
Glucose-Capillary: 152 mg/dL — ABNORMAL HIGH (ref 70–99)
Glucose-Capillary: 225 mg/dL — ABNORMAL HIGH (ref 70–99)

## 2023-03-02 LAB — CULTURE, BLOOD (ROUTINE X 2)
Culture: NO GROWTH
Culture: NO GROWTH
Special Requests: ADEQUATE
Special Requests: ADEQUATE

## 2023-03-02 MED ORDER — HALOPERIDOL DECANOATE 100 MG/ML IM SOLN: 100.0000 mg | INTRAMUSCULAR | Status: DC

## 2023-03-02 MED ORDER — DOXYCYCLINE HYCLATE 100 MG PO TABS: 100.0000 mg | ORAL_TABLET | Freq: Two times a day (BID) | ORAL | 0 refills | Status: AC

## 2023-03-02 MED ORDER — CEPHALEXIN 500 MG PO CAPS: 500.0000 mg | ORAL_CAPSULE | Freq: Four times a day (QID) | ORAL | 0 refills | Status: AC

## 2023-03-02 MED ORDER — LOPERAMIDE HCL 2 MG PO CAPS: 2.0000 mg | ORAL_CAPSULE | ORAL | Status: AC | PRN

## 2023-03-02 MED ORDER — GLIPIZIDE 5 MG PO TABS: 5.0000 mg | ORAL_TABLET | Freq: Two times a day (BID) | ORAL | 1 refills | Status: AC

## 2023-03-02 NOTE — TOC Progression Note (Signed)
 Transition of Care [facility]) - Progression Note    Patient Details  Name: Cristina [name] MRN: [id number] Date of Birth: 06-09-1960  Transition of [facility]) CM/SW Contact  Royanne JINNY Bernheim, RN Phone Number: 03/02/2023, 10:28 AM  Clinical Narrative:    Fl2 completed Tammy at [facility] will accept the patient and will transport at [place], [facility] will accept the patient back for [facility]  uses a rollter in facitilty to get to bathroom, dining room   Expected Discharge Plan: Home w Home Health Services Barriers to Discharge: Continued Medical Work up  Expected Discharge Plan and Services   Discharge Planning Services: CM Consult   Living arrangements for the past 2 months: Assisted Living Facility                 DME Arranged: N/A DME Agency: NA       HH Arranged: PT, OT HH Agency: [facility] ([facility] Date HH Agency Contacted: 03/01/23 Time HH Agency Contacted: [phone] Representative spoke with at [facility] Agency: [facility]   Social Determinants of Health (SDOH) Interventions SDOH Screenings   Food Insecurity: No Food Insecurity (02/26/2023)  Housing: Unknown (02/26/2023)  Transportation Needs: No Transportation Needs (02/26/2023)  Utilities: Not At Risk (02/26/2023)  Tobacco Use: High Risk (02/25/2023)    Readmission Risk Interventions     No data to display

## 2023-03-02 NOTE — Progress Notes (Signed)
 SATURATION QUALIFICATIONS: (This note is used to comply with regulatory documentation for home oxygen)  Patient Saturations on Room Air at Rest = 100%  Patient Saturations on Room Air while Ambulating = 84-88%  Patient Saturations on 2-3 Liters of oxygen while Ambulating = 100%  Please briefly explain why patient needs home oxygen: Patient desaturates below 88% of oxygen on room air when walking.

## 2023-03-02 NOTE — TOC Progression Note (Signed)
 Transition of Care [facility]) - Progression Note    Patient Details  Name: Cristina [name] MRN: [id number] Date of Birth: [birth year]  Transition of Care [facility]) CM/SW Contact  Royanne JINNY Bernheim, RN Phone Number: 03/02/2023, 11:48 AM  Clinical Narrative:    [facility] to deliver Oxygen to the bedside  Expected Discharge Plan: Home w Home Health Services Barriers to Discharge: Continued Medical Work up  Expected Discharge Plan and Services   Discharge Planning Services: CM Consult   Living arrangements for the past 2 months: Assisted Living Facility                 DME Arranged: N/A DME Agency: NA       HH Arranged: PT, OT HH Agency: [facility] Date HH Agency Contacted: 03/01/23 Time HH Agency Contacted: [phone] Representative spoke with at [facility] Agency: [facility]   Social Determinants of Health (SDOH) Interventions SDOH Screenings   Food Insecurity: No Food Insecurity (02/26/2023)  Housing: Unknown (02/26/2023)  Transportation Needs: No Transportation Needs (02/26/2023)  Utilities: Not At Risk (02/26/2023)  Tobacco Use: High Risk (02/25/2023)    Readmission Risk Interventions    03/02/2023   10:31 AM  Readmission Risk Prevention Plan  Transportation Screening Complete  PCP or Specialist Appt within 3-5 Days Complete  HRI or Home Care Consult Complete  Palliative Care Screening Not Applicable  Medication Review (RN Care Manager) Referral to Pharmacy

## 2023-03-02 NOTE — Discharge Summary (Addendum)
 Physician Discharge Summary   Patient: Cristina [name] MRN: [id number] DOB: 1960/05/26  Admit date:     02/25/2023  Discharge date: 03/02/23  Discharge Physician: Burnard DELENA Cunning   PCP: Brutus Delon SAUNDERS, NP   Recommendations at discharge:   Follow with primary care in 1-2 weeks Repeat CBC, BMP at follow up Follow up on resolution of distal left leg cellulitis Follow up and monitor left plantar foot wound for healing, recommend Podiatry referral / follow up as needed if not healing Follow up with Pulmonology, Dr. Theotis Recommend resuming CPA.  Per chart review pt needed new outpatient sleep study as of last pulmonary office visit.  Unclear if that was done. Follow up on oxygen requirements.  Pt currently needs 2 L/min with ambulation.  Recommend oxygen use during sleep if not using CPAP.  Discharge Diagnoses: Active Problems:   COPD (chronic obstructive pulmonary disease) (HCC)   Type 2 diabetes mellitus without complications (HCC)   Essential hypertension   Chronic respiratory failure with hypoxia (HCC)   Generalized weakness   Sleep apnea   Polyneuropathy   HLD (hyperlipidemia)   Dyslipidemia   Anxiety and depression  Principal Problem (Resolved):   Sepsis due to cellulitis [facility]) Resolved Problems:   COVID-19 virus infection   Hypokalemia   Hyponatremia  Hospital Course:  HPI on admission:  Cristina [name] is a 63 y.o. Caucasian female with medical history significant for COPD, dCHF, type diabetes mellitus, GERD, hypertension and dyslipidemia, schizoaffective disorder, CVA, OSA, asthma and osteoarthritis, who presented to the emergency room with acute onset of fever with recent COVID 19 diagnosed on Monday.  She has been having fever and mild chills as well as nausea without vomiting or diarrhea.  She admitted to dry cough without wheezing or dyspnea.  Over the last week she has been developing left foot and leg swelling with erythema, warmth, tenderness and pain    See H&P  for full HPI on admission & ED course.   Pt was admitted meeting severe sepsis criteria with fever, tachycardia, leukocytosis lactic acidosis in the setting of right lower extremity cellulitis and Covid-19 infection.  Pt was started on IV antibiotics and given IV fluids per sepsis protocol.  Further hospital course and management as outlined below.   03/02/23 -- pt doing well, cellulitis has significantly improved with MRSA coverage and compression wrapping.  Pt qualified for home oxygen with ambulation.  Pt is clinically improved, medically stable and agreeable to discharge home today.   Assessment and Plan:  Sepsis due to cellulitis (HCC) Severe sepsis POA with fever, tachycardia, and leukocytosis, lactic acidosis consistent with organ dysfunction.  Sepsis physiology resolved.  Lactate normalized.   Hemodynamically stable. Right lower extremity cellulitis  U/S RLE negative for DVT Erythema has significantly improved with antibiotics. Acute lower UTI. --Completed IV Rocephin  for E coli UTI  --Treated also with IV Vancomycin  for cellulitis that was not improving on Rocephin  alone --Discharge on PO Doxycycline  to complete course for cellulitis  Left plantar foot wound -- --Wound care RN evaluated, appreciate input --Continue wound care  Dressing procedure/placement/frequency: Cleanse left plantar wound with NS and pat dry. Apply mupirocin  ointment and cover with dry dressing.  --Consider Podiatry referral / follow up if not healing  --Monitor for signs of infection   Hyponatremia Resolved.  Na 129 improved to 134 with sepsis IV fluids, consistent with hypovolemia. --Off IV fluids --Resume home Lasix   --Monitor BMP and volume status   Hypokalemia K 3.3 on 1/11 was replaced. --  Monitor BMP, replace K as needed   COVID-19 virus infection Reportedly diagnosed on 02/22/23 --Airborne and contact isolation --Symptomatic care PRN per orders   Generalized weakness Due to multiple  acute infections and underlying COPD, deconditioning. Pt is from ALF --PT/OT evaluations --TOC consulted for [facility] or SNF arrangements   Chronic respiratory failure with hypoxia (HCC) Pt reports she is supposed to be on 2 L/min oxygen at home.  Oxygen requirement is fairly stable, on 2-3 L/min. COPD not exacerbated, monitor closely. --O2 per protocol, maintain sats 88-93%   Essential hypertension --Continue home lisinopril , metoprolol  --Hold home Lasix  while on IV fluids for lactic acidosis   Hyperglycemia Type 2 diabetes mellitus without complications (HCC) --Sliding scale Novolog  --Continue home Januvia (Tradjenta  formulary substitute) and glipizide  --Hold metformin  --Hypoglycemia protocol --Titrate regimen for inpatient goal 140-180   COPD (chronic obstructive pulmonary disease) (HCC) Not acutely exacerbated, but is at risk for exacerbation given Covid-19 infection. --Inpatient meds: PRN albuterol  HFA, Breo, Mucolytics, pulmonary hygiene and other symptomatic care per orders PRN --Resume home regimen on discharge --Follow up with Pulmonology outpatient   Anxiety and depression --Continue home BuSpar  and Effexor , Klonopin  at bedtime   Dyslipidemia --Continue statin   HLD (hyperlipidemia) --Lipitor    Polyneuropathy --Lyrica    Chronic respiratory failure with hypoxia O2 sats 100% rest on room air, but drop to 84-88% with ambulating on room air --Home oxygen ordered, TOC to arrange --Follow up with Pulmonology outpatient  Chronic diastolic CHF - grade 1 diastolic on echo noted in 2018.  Euvolemic.  --Monitor volume status  Sleep apnea Appears not using CPAP --Recommend nocturnal oxygen use, pt counseled and expressed understanding  Morbid obesity - Body mass index is 49.04 kg/m. Complicates overall care and prognosis.  Recommend lifestyle modifications including physical activity and diet for weight loss and overall long-term health.       Consultants:  None Procedures performed: None  Disposition: Assisted living Diet recommendation:  Discharge Diet Orders (From admission, onward)     Start     Ordered   03/02/23 0000  Diet - heart healthy & carb modified        03/02/23 1155            DISCHARGE MEDICATION: Allergies as of 03/02/2023       Reactions   Bee Venom Anaphylaxis, Shortness Of Breath   Stated by Patient   Shellfish Allergy Anaphylaxis, Shortness Of Breath   Stated by Patient Stated by Patient   Shellfish-derived Products Anaphylaxis, Shortness Of Breath   Stated by Patient   Codeine    Thorazine [chlorpromazine] Swelling   Tylenol  With Codeine #3 [acetaminophen -codeine] Other (See Comments)   Hallucinations   Metronidazole  Rash        Medication List     TAKE these medications    acetaminophen  500 MG tablet Commonly known as: TYLENOL  Take 500 mg by mouth every 8 (eight) hours as needed for mild pain or moderate pain.   albuterol  108 (90 Base) MCG/ACT inhaler Commonly known as: VENTOLIN  HFA Inhale 2 puffs into the lungs every 6 (six) hours as needed for shortness of breath.   Artificial Tears 0.2-0.2-1 % Soln Generic drug: Glycerin-Hypromellose-PEG 400 Place 1 drop into both eyes 2 (two) times daily.   aspirin  EC 81 MG tablet Take 81 mg by mouth daily.   atorvastatin  20 MG tablet Commonly known as: LIPITOR Take 20 mg by mouth at bedtime.   Breo Ellipta  100-25 MCG/ACT Aepb Generic drug: fluticasone  furoate-vilanterol Inhale 1  puff into the lungs daily.   busPIRone  15 MG tablet Commonly known as: BUSPAR  Take 15 mg by mouth 2 (two) times daily.   CALCIUM  600 PO Take by mouth daily.   cephALEXin  500 MG capsule Commonly known as: KEFLEX  Take 1 capsule (500 mg total) by mouth 4 (four) times daily for 10 days.   Chest Congestion Relief 400 MG Tabs tablet Generic drug: guaifenesin  Take 400 mg by mouth every 6 (six) hours as needed (Congestion).   clonazePAM  1 MG tablet Commonly known  as: KLONOPIN  Take 1 mg by mouth at bedtime.   clonazePAM  0.5 MG tablet Commonly known as: KLONOPIN  Take 1 tablet (0.5 mg total) by mouth 2 (two) times daily.   dicyclomine  20 MG tablet Commonly known as: BENTYL  Take 20 mg by mouth 4 (four) times daily.   diphenoxylate -atropine  2.5-0.025 MG tablet Commonly known as: LOMOTIL  Take 1 tablet by mouth daily as needed for diarrhea or loose stools.   doxycycline  100 MG tablet Commonly known as: VIBRA -TABS Take 1 tablet (100 mg total) by mouth 2 (two) times daily for 5 days.   famotidine  20 MG tablet Commonly known as: PEPCID  Take 20 mg by mouth daily.   furosemide  40 MG tablet Commonly known as: LASIX  Take 40 mg by mouth daily.   glipiZIDE  5 MG tablet Commonly known as: GLUCOTROL  Take 1 tablet (5 mg total) by mouth 2 (two) times daily before a meal. What changed:  how much to take when to take this   haloperidol  decanoate 100 MG/ML injection Commonly known as: HALDOL  DECANOATE Inject 1 mL (100 mg total) into the muscle every 30 (thirty) days.   HONEY LEMON COUGH DROPS MT Use as directed 1 lozenge in the mouth or throat 4 (four) times daily as needed (cough).   Insulin  Pen Needle 31G X 8 MM Misc PEN NEEDLES 31G X 6 MM MISC   ketoconazole  2 % cream Commonly known as: NIZORAL  Apply 1 Application topically daily.   lisinopril  5 MG tablet Commonly known as: ZESTRIL  Take 5 mg by mouth daily.   loperamide  2 MG capsule Commonly known as: IMODIUM  Take 1 capsule (2 mg total) by mouth as needed for diarrhea or loose stools. What changed:  when to take this reasons to take this   metoprolol  tartrate 25 MG tablet Commonly known as: LOPRESSOR  Take 0.5 tablets (12.5 mg total) by mouth 2 (two) times daily. What changed:  how much to take when to take this   nitroGLYCERIN  0.3 MG SL tablet Commonly known as: NITROSTAT  Place 0.3 mg under the tongue every 5 (five) minutes as needed for chest pain.   ondansetron  4 MG  tablet Commonly known as: ZOFRAN  Take 4 mg by mouth every 6 (six) hours as needed for nausea or vomiting.   oxybutynin  10 MG 24 hr tablet Commonly known as: DITROPAN -XL Take 10 mg by mouth daily.   potassium chloride  10 MEQ tablet Commonly known as: KLOR-CON  Take 10 mEq by mouth daily.   pregabalin  100 MG capsule Commonly known as: LYRICA  Take 100 mg by mouth 3 (three) times daily.   Refresh Tears 0.5 % Soln Generic drug: carboxymethylcellulose Place 2 drops into both eyes 4 (four) times daily as needed (Dry eyes).   sitaGLIPtin 100 MG tablet Commonly known as: JANUVIA Take 100 mg by mouth daily.   terbinafine 250 MG tablet Commonly known as: LAMISIL Take 250 mg by mouth daily.   traMADol  50 MG tablet Commonly known as: Ultram  Take 1 tablet (50  mg total) by mouth every 6 (six) hours as needed.   venlafaxine  XR 75 MG 24 hr capsule Commonly known as: EFFEXOR -XR Take 75 mg by mouth daily with breakfast. Take with 150mg  capsule for a total of 225mg    venlafaxine  XR 150 MG 24 hr capsule Commonly known as: EFFEXOR -XR Take 150 mg by mouth daily with breakfast. Take with 75mg  capsule for a total of 225mg    Vitamin D-3 25 MCG (1000 UT) Caps Take 2 capsules by mouth daily.               Durable Medical Equipment  (From admission, onward)           Start     Ordered   03/02/23 1138  For home use only DME oxygen  Once       Question Answer Comment  Length of Need Lifetime   Mode or (Route) Nasal cannula   Liters per Minute 2   Frequency Continuous (stationary and portable oxygen unit needed)   Oxygen delivery system Gas      03/02/23 1137              Discharge Care Instructions  (From admission, onward)           Start     Ordered   03/02/23 0000  Discharge wound care:       Comments: Left foot --Cleanse left plantar wound with NS and pat dry. Apply mupirocin  ointment and cover with dry dressing.  Right leg open to air, if weeping, may wrap  with kerlix and ace wrap.   03/02/23 1155            Discharge Exam: Filed Weights   02/25/23 1712 02/26/23 2200  Weight: 99.8 kg 102.8 kg   General exam: awake, alert, no acute distress, obese HEENT: atraumatic, clear conjunctiva, anicteric sclera, moist mucus membranes, hearing grossly normal  Respiratory system: CTAB, no wheezes, rales or rhonchi, normal respiratory effort. Cardiovascular system: normal S1/S2, RRR Gastrointestinal system: soft, NT, ND Central nervous system: A&O x3. no gross focal neurologic deficits, normal speech Extremities: LLE erythem significantly faded and differential warmth has resolved, edema improved,  Psychiatry: normal mood, flat affect, judgement and insight appear normal   Condition at discharge: stable  The results of significant diagnostics from this hospitalization (including imaging, microbiology, ancillary and laboratory) are listed below for reference.   Imaging Studies: US  Venous Img Lower Unilateral Right (DVT) Result Date: 02/26/2023 CLINICAL DATA:  Right lower extremity swelling for a week EXAM: Right LOWER EXTREMITY VENOUS DOPPLER ULTRASOUND TECHNIQUE: Gray-scale sonography with graded compression, as well as color Doppler and duplex ultrasound were performed to evaluate the lower extremity deep venous systems from the level of the common femoral vein and including the common femoral, femoral, profunda femoral, popliteal and calf veins including the posterior tibial, peroneal and gastrocnemius veins when visible. The superficial great saphenous vein was also interrogated. Spectral Doppler was utilized to evaluate flow at rest and with distal augmentation maneuvers in the common femoral, femoral and popliteal veins. COMPARISON:  07/19/2022 FINDINGS: Contralateral Common Femoral Vein: Respiratory phasicity is normal and symmetric with the symptomatic side. No evidence of thrombus. Normal compressibility. Common Femoral Vein: No evidence of  thrombus. Normal compressibility, respiratory phasicity and response to augmentation. Saphenofemoral Junction: No evidence of thrombus. Normal compressibility and flow on color Doppler imaging. Profunda Femoral Vein: No evidence of thrombus. Normal compressibility and flow on color Doppler imaging. Femoral Vein: No evidence of thrombus. Normal compressibility, respiratory  phasicity and response to augmentation. Popliteal Vein: No evidence of thrombus. Normal compressibility, respiratory phasicity and response to augmentation. Calf Veins: No evidence of thrombus. Normal compressibility and flow on color Doppler imaging. Superficial Great Saphenous Vein: No evidence of thrombus. Normal compressibility. Venous Reflux:  None. Other Findings:  None. IMPRESSION: No evidence of right lower extremity DVT. Electronically Signed   By: Ranell Bring M.D.   On: 02/26/2023 10:34    Microbiology: Results for orders placed or performed during the hospital encounter of 02/25/23  Urine Culture (for pregnant, neutropenic or urologic patients or patients with an indwelling urinary catheter)     Status: Abnormal   Collection Time: 02/25/23  6:19 PM   Specimen: Urine, Catheterized  Result Value Ref Range Status   Specimen Description   Final    URINE, CATHETERIZED Performed at [facility], [address] [place], [place] [address]    Special Requests   Final    NONE Performed at [facility], [address] [place], [place] [address]    Culture >=100,000 COLONIES/mL ESCHERICHIA COLI (A)  Final   Report Status 02/28/2023 FINAL  Final   Organism ID, Bacteria ESCHERICHIA COLI (A)  Final      Susceptibility   Escherichia coli - MIC*    AMPICILLIN <=2 SENSITIVE Sensitive     CEFAZOLIN <=4 SENSITIVE Sensitive     CEFEPIME  <=0.12 SENSITIVE Sensitive     CEFTRIAXONE  <=0.25 SENSITIVE Sensitive     CIPROFLOXACIN  >=4 RESISTANT Resistant     GENTAMICIN  <=1 SENSITIVE Sensitive     IMIPENEM <=0.25  SENSITIVE Sensitive     NITROFURANTOIN <=16 SENSITIVE Sensitive     TRIMETH /SULFA  <=20 SENSITIVE Sensitive     AMPICILLIN/SULBACTAM <=2 SENSITIVE Sensitive     PIP/TAZO <=4 SENSITIVE Sensitive ug/mL    * >=100,000 COLONIES/mL ESCHERICHIA COLI  Culture, blood (routine x 2)     Status: None   Collection Time: 02/25/23  8:36 PM   Specimen: BLOOD  Result Value Ref Range Status   Specimen Description BLOOD LEFT ANTECUBITAL  Final   Special Requests   Final    BOTTLES DRAWN AEROBIC AND ANAEROBIC Blood Culture adequate volume   Culture   Final    NO GROWTH 5 DAYS Performed at [facility], [address] [place], [place] [address]    Report Status 03/02/2023 FINAL  Final  Culture, blood (routine x 2)     Status: None   Collection Time: 02/25/23  8:36 PM   Specimen: BLOOD  Result Value Ref Range Status   Specimen Description BLOOD RIGHT ANTECUBITAL  Final   Special Requests   Final    BOTTLES DRAWN AEROBIC AND ANAEROBIC Blood Culture adequate volume   Culture   Final    NO GROWTH 5 DAYS Performed at [facility], [address] [place], [place] [address]    Report Status 03/02/2023 FINAL  Final  Resp panel by RT-PCR (RSV, Flu A&B, Covid) Anterior Nasal Swab     Status: Abnormal   Collection Time: 02/25/23  8:46 PM   Specimen: Anterior Nasal Swab  Result Value Ref Range Status   SARS Coronavirus 2 by RT PCR POSITIVE (A) NEGATIVE Final    Comment: (NOTE) SARS-CoV-2 target nucleic acids are DETECTED.  The SARS-CoV-2 RNA is generally detectable in upper respiratory specimens during the acute phase of infection. Positive results are indicative of the presence of the identified virus, but do not rule out bacterial infection or co-infection with other  pathogens not detected by the test. Clinical correlation with patient history and other diagnostic information is necessary to determine patient infection status. The expected result is Negative.  Fact Sheet for  Patients: bloggercourse.com  Fact Sheet for Healthcare Providers: seriousbroker.it  This test is not yet approved or cleared by the [place]  FDA and  has been authorized for detection and/or diagnosis of SARS-CoV-2 by FDA under an Emergency Use Authorization (EUA).  This EUA will remain in effect (meaning this test can be used) for the duration of  the COVID-19 declaration under Section 564(b)(1) of the A ct, 21 U.S.C. section 360bbb-3(b)(1), unless the authorization is terminated or revoked sooner.     Influenza A by PCR NEGATIVE NEGATIVE Final   Influenza B by PCR NEGATIVE NEGATIVE Final    Comment: (NOTE) The Xpert Xpress SARS-CoV-2/FLU/RSV plus assay is intended as an aid in the diagnosis of influenza from Nasopharyngeal swab specimens and should not be used as a sole basis for treatment. Nasal washings and aspirates are unacceptable for Xpert Xpress SARS-CoV-2/FLU/RSV testing.  Fact Sheet for Patients: bloggercourse.com  Fact Sheet for Healthcare Providers: seriousbroker.it  This test is not yet approved or cleared by the [place]  FDA and has been authorized for detection and/or diagnosis of SARS-CoV-2 by FDA under an Emergency Use Authorization (EUA). This EUA will remain in effect (meaning this test can be used) for the duration of the COVID-19 declaration under Section 564(b)(1) of the Act, 21 U.S.C. section 360bbb-3(b)(1), unless the authorization is terminated or revoked.     Resp Syncytial Virus by PCR NEGATIVE NEGATIVE Final    Comment: (NOTE) Fact Sheet for Patients: bloggercourse.com  Fact Sheet for Healthcare Providers: seriousbroker.it  This test is not yet approved or cleared by the [place]  FDA and has been authorized for detection and/or diagnosis of SARS-CoV-2 by FDA under an Emergency Use  Authorization (EUA). This EUA will remain in effect (meaning this test can be used) for the duration of the COVID-19 declaration under Section 564(b)(1) of the Act, 21 U.S.C. section 360bbb-3(b)(1), unless the authorization is terminated or revoked.  Performed at [facility], [address] [place], [place] [address]     Labs: CBC: Recent Labs  Lab 02/25/23 1716 02/26/23 0457 02/27/23 0131 02/28/23 0340  WBC 13.2* 13.8* 12.0* 8.0  NEUTROABS 11.8*  --   --   --   HGB 12.2 11.1* 11.2* 10.6*  HCT 37.2 34.3* 33.9* 32.6*  MCV 84.7 85.8 83.3 83.6  PLT 181 145* 143* 150   Basic Metabolic Panel: Recent Labs  Lab 02/26/23 1312 02/27/23 0131 02/28/23 0340 03/01/23 0403 03/02/23 0517  NA 134* 133* 131* 136 136  K 3.6 3.3* 3.9 4.1 4.2  CL 98 97* 99 101 102  CO2 28 25 25 29 28   GLUCOSE 158* 179* 150* 154* 182*  BUN 21 20 12 12 8   CREATININE 1.17* 0.94 0.65 0.78 0.61  CALCIUM  8.3* 8.3* 8.1* 8.5* 8.4*  MG  --   --  2.0  --   --    Liver Function Tests: Recent Labs  Lab 02/25/23 1716  AST 36  ALT 24  ALKPHOS 113  BILITOT 0.6  PROT 6.9  ALBUMIN 3.5   CBG: Recent Labs  Lab 03/01/23 1153 03/01/23 1646 03/01/23 2104 03/02/23 0758 03/02/23 1146  GLUCAP 241* 170* 167* 152* 225*    Discharge time spent: greater than 30 minutes.  Signed: Burnard DELENA Cunning, DO [facility] 03/02/2023

## 2023-03-02 NOTE — Progress Notes (Signed)
 Three attempts were made to speak to Cristina [name] at [facility] at phone number [phone] and all three attempts were unsuccessful. Patient discharged with personal belongings and new oxygen tank. Patient wheeled out by staff. Patient transported back to [facility] via [facility] vehilce.

## 2023-03-02 NOTE — [place] FL2 (Signed)
 [place]  MEDICAID FL2 LEVEL OF CARE FORM     IDENTIFICATION  Patient Name: Cristina [name] Birthdate: Jul 04, 1960 Sex: female Admission Date (Current Location): 02/25/2023  [facility] and [place] Number:  Chiropodist and Address:  [facility], [address], [place], [place] [address]      Provider Number: [id number]  Attending Physician Name and Address:  Fausto Burnard LABOR, DO  Relative Name and Phone Number:  Rowena Santee  Daughter  Emergency Contact  [phone]    Current Level of Care: Hospital Recommended Level of Care: Assisted Living Facility Prior Approval Number:    Date Approved/Denied:   PASRR Number:    Discharge Plan: Other (Comment) (ALF)    Current Diagnoses: Patient Active Problem List   Diagnosis Date Noted   Chronic respiratory failure with hypoxia (HCC) 02/27/2023   Hypokalemia 02/27/2023   Hyponatremia 02/27/2023   Generalized weakness 02/27/2023   Type 2 diabetes mellitus without complications (HCC) 02/26/2023   Dyslipidemia 02/26/2023   Essential hypertension 02/26/2023   Anxiety and depression 02/26/2023   COVID-19 virus infection 02/26/2023   Sepsis due to cellulitis (HCC) 02/25/2023   Cellulitis of neck 06/20/2017   AVM (arteriovenous malformation) of small bowel, acquired with hemorrhage    Goals of care, counseling/discussion 05/17/2017   Iron deficiency anemia 05/10/2017   B12 deficiency 05/10/2017   GI bleed 03/06/2017   Abdominal pain    Symptomatic anemia    Acute posthemorrhagic anemia    Heme + stool    Sepsis (HCC) 07/15/2016   Septic shock (HCC) 10/28/2015   COPD (chronic obstructive pulmonary disease) (HCC) 07/31/2015   Aspirin  overdose 07/30/2015   Schizoaffective disorder, bipolar type (HCC) 12/05/2014   HTN (hypertension) 12/05/2014   Sleep apnea 12/05/2014   Diabetes (HCC) 12/05/2014   Incomplete bladder emptying 12/04/2014   Recurrent UTI 12/04/2014   Polyneuropathy 03/20/2009    Arthritis, degenerative 05/23/2007   HLD (hyperlipidemia) 05/23/2007    Orientation RESPIRATION BLADDER Height & Weight     Self, Time, Place  Normal Incontinent Weight: 102.8 kg Height:  4' 9 (144.8 cm)  BEHAVIORAL SYMPTOMS/MOOD NEUROLOGICAL BOWEL NUTRITION STATUS      Continent Diet  AMBULATORY STATUS COMMUNICATION OF NEEDS Skin   Limited Assist Verbally Normal                       Personal Care Assistance Level of Assistance  Bathing, Feeding, Dressing Bathing Assistance: Limited assistance Feeding assistance: Independent Dressing Assistance: Limited assistance     Functional Limitations Info  Sight, Hearing, Speech Sight Info: Adequate Hearing Info: Adequate Speech Info: Adequate    SPECIAL CARE FACTORS FREQUENCY  PT (By licensed PT), OT (By licensed OT)     PT Frequency: 2 times per week OT Frequency: 2 times per week            Contractures Contractures Info: Not present    Additional Factors Info  Code Status, Allergies Code Status Info: Full code Allergies Info: Bee Venom, Shellfish Allergy, Shellfish-derived Products, Codeine, Thorazine (Chlorpromazine), Tylenol  With Codeine #3 (Acetaminophen -codeine), Metronidazole            Current Medications (03/02/2023):  This is the current hospital active medication list Current Facility-Administered Medications  Medication Dose Route Frequency Provider Last Rate Last Admin   acetaminophen  (TYLENOL ) tablet 650 mg  650 mg Oral Q6H PRN Mansy, Jan A, MD   650 mg at 02/28/23 1253   Or   acetaminophen  (TYLENOL ) suppository 650 mg  650 mg Rectal Q6H PRN Mansy, Jan A, MD       albuterol  (VENTOLIN  HFA) 108 (90 Base) MCG/ACT inhaler 2 puff  2 puff Inhalation Q6H PRN Mansy, Jan A, MD       aspirin  EC tablet 81 mg  81 mg Oral Daily Mansy, Jan A, MD   81 mg at 03/01/23 0900   atorvastatin  (LIPITOR) tablet 20 mg  20 mg Oral QHS Mansy, Jan A, MD   20 mg at 03/01/23 2146   busPIRone  (BUSPAR ) tablet 15 mg  15 mg Oral  BID Mansy, Jan A, MD   15 mg at 03/01/23 2145   clonazePAM  (KLONOPIN ) tablet 0.5 mg  0.5 mg Oral BID Belue, Nathan S, RPH   0.5 mg at 03/02/23 9175   clonazePAM  (KLONOPIN ) tablet 1 mg  1 mg Oral QHS Mansy, Jan A, MD   1 mg at 03/01/23 2146   dicyclomine  (BENTYL ) tablet 20 mg  20 mg Oral QID Mansy, Jan A, MD   20 mg at 03/01/23 2148   enoxaparin  (LOVENOX ) injection 40 mg  40 mg Subcutaneous Q24H Mansy, Jan A, MD   40 mg at 03/01/23 2041   famotidine  (PEPCID ) tablet 20 mg  20 mg Oral Daily Mansy, Jan A, MD   20 mg at 03/01/23 0900   fluticasone  furoate-vilanterol (BREO ELLIPTA ) 100-25 MCG/ACT 1 puff  1 puff Inhalation Daily Mansy, Jan A, MD   1 puff at 03/02/23 0825   furosemide  (LASIX ) tablet 40 mg  40 mg Oral Daily Fausto Sor A, DO   40 mg at 03/01/23 2038   glipiZIDE  (GLUCOTROL ) tablet 5 mg  5 mg Oral BID AC Fausto Sor A, DO   5 mg at 03/02/23 9175   guaiFENesin -dextromethorphan  (ROBITUSSIN DM) 100-10 MG/5ML syrup 5 mL  5 mL Oral Q4H PRN Fausto Sor A, DO   5 mL at 02/28/23 2121   haloperidol  decanoate (HALDOL  DECANOATE) 100 MG/ML injection 100 mg  100 mg Intramuscular Q30 days Mansy, Jan A, MD       insulin  aspart (novoLOG ) injection 0-15 Units  0-15 Units Subcutaneous TID WC Mansy, [place] LABOR, MD   3 Units at 03/02/23 0825   insulin  aspart (novoLOG ) injection 0-5 Units  0-5 Units Subcutaneous QHS Mansy, Jan A, MD   3 Units at 02/25/23 2332   Ipratropium-Albuterol  (COMBIVENT ) respimat 1 puff  1 puff Inhalation Q6H Fausto Sor A, DO   1 puff at 03/02/23 0825   linagliptin  (TRADJENTA ) tablet 5 mg  5 mg Oral Daily Mansy, Jan A, MD   5 mg at 03/01/23 0900   lisinopril  (ZESTRIL ) tablet 5 mg  5 mg Oral Daily Mansy, Jan A, MD   5 mg at 02/28/23 9072   loperamide  (IMODIUM ) capsule 2 mg  2 mg Oral QID PRN Mansy, Jan A, MD   2 mg at 02/26/23 2220   magnesium  hydroxide (MILK OF MAGNESIA) suspension 30 mL  30 mL Oral Daily PRN Mansy, Jan A, MD       metoprolol  tartrate (LOPRESSOR ) tablet 25 mg   25 mg Oral Daily Belue, Nathan S, RPH   25 mg at 02/28/23 9072   mupirocin  ointment (BACTROBAN ) 2 %   Topical Daily Fausto Sor A, DO   Given at 03/01/23 0618   nitroGLYCERIN  (NITROSTAT ) SL tablet 0.4 mg  0.4 mg Sublingual Q5 min PRN Mansy, Jan A, MD       ondansetron  (ZOFRAN ) tablet 4 mg  4 mg Oral Q6H PRN Mansy, Jan A,  MD       Or   ondansetron  (ZOFRAN ) injection 4 mg  4 mg Intravenous Q6H PRN Mansy, Jan A, MD       oxybutynin  (DITROPAN -XL) 24 hr tablet 10 mg  10 mg Oral Daily Mansy, Jan A, MD   10 mg at 03/01/23 0900   potassium chloride  (KLOR-CON  M) CR tablet 10 mEq  10 mEq Oral Daily Mansy, Jan A, MD   10 mEq at 03/01/23 0900   pregabalin  (LYRICA ) capsule 100 mg  100 mg Oral TID Mansy, Jan A, MD   100 mg at 03/01/23 2146   traMADol  (ULTRAM ) tablet 50 mg  50 mg Oral Q6H PRN Mansy, Jan A, MD   50 mg at 03/01/23 0603   traZODone  (DESYREL ) tablet 25 mg  25 mg Oral QHS PRN Mansy, Jan A, MD   25 mg at 02/27/23 2227   vancomycin  (VANCOCIN ) IVPB 1000 mg/200 mL premix  1,000 mg Intravenous Q24H Niels Kayla FALCON, [facility]   Stopped at 03/01/23 1300   venlafaxine  XR (EFFEXOR -XR) 24 hr capsule 225 mg  225 mg Oral Q breakfast Mansy, Jan A, MD   225 mg at 03/02/23 9175     Discharge Medications: Please see discharge summary for a list of discharge medications.  Relevant Imaging Results:  Relevant Lab Results:   Additional Information SSN: [id number]      Royanne JINNY Bernheim, RN

## 2023-03-02 NOTE — Plan of Care (Signed)
  Problem: Coping: Goal: Ability to adjust to condition or change in health will improve Outcome: Progressing   Problem: Education: Goal: Knowledge of risk factors and measures for prevention of condition will improve Outcome: Progressing   Problem: Respiratory: Goal: Will maintain a patent airway Outcome: Progressing   Problem: Clinical Measurements: Goal: Diagnostic test results will improve Outcome: Progressing   Problem: Nutrition: Goal: Adequate nutrition will be maintained Outcome: Progressing

## 2023-03-02 NOTE — Plan of Care (Signed)

## 2023-03-09 ENCOUNTER — Emergency Department
Admission: EM | Admit: 2023-03-09 | Discharge: 2023-03-09 | Disposition: A | Discharge status: Home / Self Care | Payer: Medicare Other | Attending: Emergency Medicine | Admitting: Emergency Medicine

## 2023-03-09 ENCOUNTER — Emergency Department: Payer: Medicare Other

## 2023-03-09 ENCOUNTER — Other Ambulatory Visit: Payer: Self-pay

## 2023-03-09 DIAGNOSIS — D72829 Elevated white blood cell count, unspecified: Secondary | ICD-10-CM | POA: Diagnosis not present

## 2023-03-09 DIAGNOSIS — E119 Type 2 diabetes mellitus without complications: Secondary | ICD-10-CM | POA: Diagnosis not present

## 2023-03-09 DIAGNOSIS — J449 Chronic obstructive pulmonary disease, unspecified: Secondary | ICD-10-CM | POA: Insufficient documentation

## 2023-03-09 DIAGNOSIS — L03116 Cellulitis of left lower limb: Secondary | ICD-10-CM | POA: Insufficient documentation

## 2023-03-09 DIAGNOSIS — I10 Essential (primary) hypertension: Secondary | ICD-10-CM | POA: Diagnosis not present

## 2023-03-09 LAB — LACTIC ACID, PLASMA
Lactic Acid, Venous: 1.5 mmol/L (ref 0.5–1.9)
Lactic Acid, Venous: 1.2 mmol/L (ref 0.5–1.9)

## 2023-03-09 LAB — COMPREHENSIVE METABOLIC PANEL
ALT: 27 U/L (ref 0–44)
AST: 26 U/L (ref 15–41)
Albumin: 3.5 g/dL (ref 3.5–5.0)
Alkaline Phosphatase: 113 U/L (ref 38–126)
Anion gap: 12 (ref 5–15)
BUN: 18 mg/dL (ref 8–23)
CO2: 30 mmol/L (ref 22–32)
Calcium: 9.7 mg/dL (ref 8.9–10.3)
Chloride: 95 mmol/L — ABNORMAL LOW (ref 98–111)
Creatinine, Ser: 0.72 mg/dL (ref 0.44–1.00)
GFR, Estimated: >60 mL/min (ref >60)
Glucose, Bld: 120 mg/dL — ABNORMAL HIGH (ref 70–99)
Potassium: 4.3 mmol/L (ref 3.5–5.1)
Sodium: 137 mmol/L (ref 135–145)
Total Bilirubin: 0.5 mg/dL (ref 0.0–1.2)
Total Protein: 7.4 g/dL (ref 6.5–8.1)

## 2023-03-09 LAB — CBC
HCT: 38.5 % (ref 36.0–46.0)
Hemoglobin: 12.3 g/dL (ref 12.0–15.0)
MCH: 27.5 pg (ref 26.0–34.0)
MCHC: 31.9 g/dL (ref 30.0–36.0)
MCV: 85.9 fL (ref 80.0–100.0)
Platelets: 289 10*3/uL (ref 150–400)
RBC: 4.48 MIL/uL (ref 3.87–5.11)
RDW: 14.6 % (ref 11.5–15.5)
WBC: 12.1 10*3/uL — ABNORMAL HIGH (ref 4.0–10.5)
nRBC: 0 % (ref 0.0–0.2)

## 2023-03-09 MED ORDER — DOXYCYCLINE MONOHYDRATE 100 MG PO TABS: 100.0000 mg | ORAL_TABLET | Freq: Two times a day (BID) | ORAL | 0 refills | Status: AC

## 2023-03-09 MED ORDER — SODIUM CHLORIDE 0.9 % IV SOLN
1.0000 g | INTRAVENOUS | Status: DC
  Administered 2023-03-09: 1 g via INTRAVENOUS
  Filled 2023-03-09: qty 10

## 2023-03-09 MED ORDER — CEPHALEXIN 500 MG PO CAPS: 500.0000 mg | ORAL_CAPSULE | Freq: Four times a day (QID) | ORAL | 0 refills | Status: AC

## 2023-03-09 NOTE — Discharge Instructions (Addendum)
Please take antibiotics as prescribed.  Please return if the redness extends beyond the outlined area.  Please also return if you develop any fevers, chills, worsening pain, or any other concerns.  It was a pleasure caring for you today.

## 2023-03-09 NOTE — ED Provider Notes (Signed)
[facility] Provider Note    Event Date/Time   First MD Initiated Contact with Patient 03/09/23 1126     (approximate)   History   Cellulitis   HPI  Cristina [name] is a 63 y.o. female who presents today for continued erythema to her left lower extremity.  Patient reports that this is no different from when she was discharged from the hospital several days ago.  She denies fevers or chills.  She feels that the swelling is similar to before.  She reports that the pain feels similar to before.  She has been taking doxycycline only, is unsure how many days she has left.  Patient Active Problem List   Diagnosis Date Noted   Chronic respiratory failure with hypoxia (HCC) 02/27/2023   Generalized weakness 02/27/2023   Type 2 diabetes mellitus without complications (HCC) 02/26/2023   Dyslipidemia 02/26/2023   Essential hypertension 02/26/2023   Anxiety and depression 02/26/2023   Cellulitis of neck 06/20/2017   AVM (arteriovenous malformation) of small bowel, acquired with hemorrhage    Goals of care, counseling/discussion 05/17/2017   Iron deficiency anemia 05/10/2017   B12 deficiency 05/10/2017   GI bleed 03/06/2017   Abdominal pain    Symptomatic anemia    Acute posthemorrhagic anemia    Heme + stool    Sepsis (HCC) 07/15/2016   Septic shock (HCC) 10/28/2015   COPD (chronic obstructive pulmonary disease) (HCC) 07/31/2015   Aspirin overdose 07/30/2015   Schizoaffective disorder, bipolar type (HCC) 12/05/2014   HTN (hypertension) 12/05/2014   Sleep apnea 12/05/2014   Diabetes (HCC) 12/05/2014   Incomplete bladder emptying 12/04/2014   Recurrent UTI 12/04/2014   Polyneuropathy 03/20/2009   Arthritis, degenerative 05/23/2007   HLD (hyperlipidemia) 05/23/2007          Physical Exam   Triage Vital Signs: ED Triage Vitals  Encounter Vitals Group     BP 03/09/23 1035 127/80     Systolic BP Percentile --      Diastolic BP Percentile --       Pulse Rate 03/09/23 1035 86     Resp 03/09/23 1036 17     Temp 03/09/23 1035 97.7 F (36.5 C)     Temp Source 03/09/23 1035 Oral     SpO2 03/09/23 1035 98 %     Weight --      Height --      Head Circumference --      Peak Flow --      Pain Score 03/09/23 1036 10     Pain Loc --      Pain Education --      Exclude from Growth Chart --     Most recent vital signs: Vitals:   03/09/23 1036 03/09/23 1400  BP:    Pulse:    Resp: 17   Temp:    SpO2:  99%    Physical Exam Vitals and nursing note reviewed.  Constitutional:      General: Awake and alert. No acute distress.    Appearance: Normal appearance.   HENT:     Head: Normocephalic and atraumatic.     Mouth: Mucous membranes are moist.  Eyes:     General: PERRL. Normal EOMs        Right eye: No discharge.        Left eye: No discharge.     Conjunctiva/sclera: Conjunctivae normal.  Cardiovascular:     Rate and Rhythm: Normal rate and regular rhythm.  Pulses: Normal pulses.  Pulmonary:     Effort: Pulmonary effort is normal. No respiratory distress.     Breath sounds: Normal breath sounds.  Abdominal:     Abdomen is soft. There is no abdominal tenderness. No rebound or guarding. No distention. Musculoskeletal:        General: No swelling. Normal range of motion.     Cervical back: Normal range of motion and neck supple. Left lower extremity with dry flaky skin to her lower leg and sole of foot, improved from pictures previously.  No lymphangitis.  Compartment soft and compressible.  No purulent drainage.  Compartment soft and compressible throughout.  Sensation intact light touch.  2+ pedal pulse.  Normal range of motion of all toes, ankle, and knee. Skin:    General: Skin is warm and dry.     Capillary Refill: Capillary refill takes less than 2 seconds.     Findings: No rash.  Neurological:     Mental Status: The patient is awake and alert.      ED Results / Procedures / Treatments   Labs (all labs  ordered are listed, but only abnormal results are displayed) Labs Reviewed  CBC - Abnormal; Notable for the following components:      Result Value   WBC 12.1 (*)    All other components within normal limits  COMPREHENSIVE METABOLIC PANEL - Abnormal; Notable for the following components:   Chloride 95 (*)    Glucose, Bld 120 (*)    All other components within normal limits  LACTIC ACID, PLASMA  LACTIC ACID, PLASMA     EKG     RADIOLOGY I independently reviewed and interpreted imaging and agree with radiologists findings.     PROCEDURES:  Critical Care performed:   Procedures   MEDICATIONS ORDERED IN ED: Medications  cefTRIAXone (ROCEPHIN) 1 g in sodium chloride 0.9 % 100 mL IVPB (0 g Intravenous Stopped 03/09/23 1359)     IMPRESSION / MDM / ASSESSMENT AND PLAN / ED COURSE  I reviewed the triage vital signs and the nursing notes.   Differential diagnosis includes, but is not limited to, cellulitis, venous stasis, DVT, less likely abscess.  Patient is awake and alert, hemodynamically stable and afebrile.  She is nontoxic in appearance.  I reviewed the patient's chart.  She had a recent admission from 02/25/2023 until 03/02/2023 for sepsis related to cellulitis.  At the time of her admission she was febrile to 102.9, tachycardic, and had a lactic acid of 3.  It appears that she was treated with vancomycin and Rocephin.  She was discharged on doxycycline.  It appears that she also has a prescription for Keflex, the patient reports that she had not been taking that 1.  Labs obtained in triage reveal a leukocytosis to 12.1 lactate is normal.  Ultrasound obtained is negative for DVT.  Her cellulitis appears improved from prior, she has no constitutional symptoms to suggest systemic infection.  Lactate is normal.  Given that she was only taking doxycycline, concern for incomplete coverage, therefore she was given Keflex.  She was given a dose of Rocephin in the emergency  department.  She was only given 5 days of doxycycline, so we will continue this for longer as well.  The area of erythema was outlined on her leg and we discussed the importance of close monitoring and return precautions if the erythema extends beyond the outlined area.  We also discussed strict return precautions for worsening pain, or recurrence of  fever, chills, or any other concerns.  I recommended close outpatient follow-up with her PCP for recheck this week.  Patient and daughter understand and agree with plan.  Compartments are soft and compressible, no concern for compartment syndrome.  There is no purulent drainage, no fluctuant area to suggest an abscess..  There is no lymphangitis.  Patient was discharged in stable condition.  Patient was discussed with and also seen by Dr. Arnoldo Morale who agrees with assessment and plan.  Patient's presentation is most consistent with acute complicated illness / injury requiring diagnostic workup.      FINAL CLINICAL IMPRESSION(S) / ED DIAGNOSES   Final diagnoses:  Cellulitis of left lower extremity     Rx / DC Orders   ED Discharge Orders          Ordered    cephALEXin (KEFLEX) 500 MG capsule  4 times daily        03/09/23 1352    doxycycline (ADOXA) 100 MG tablet  2 times daily        03/09/23 1352             Note:  This document was prepared using Dragon voice recognition software and may include unintentional dictation errors.   Jackelyn Hoehn, PA-C 03/09/23 1539    Corena Herter, MD 03/17/23 [id number]

## 2023-03-09 NOTE — ED Triage Notes (Signed)
Pt here via [facility] from [facility] c/o cellulitis in her left leg. Pt states her leg is swollen and blistering. Pt states she does take a fluid pill as well as an abx that is not helping with her leg.

## 2023-03-09 NOTE — ED Provider Notes (Signed)
Shared visit   Past medical history significant for recent hospitalization for cellulitis.  Sent back to the emergency department for ongoing redness to the left leg.  Patient has been on oral antibiotics since 1/14.  No fever or chills.  No nausea or vomiting.  When comparing images from when she was in the hospital to today does appear to have improvement.  No obvious wounds to her feet.  No findings concerning for an osteomyelitis.  Ultrasound DVT scan was negative.  Continues to have some mild leukocytosis but otherwise lab work and vital signs are reassuring.  Given a dose of IV Rocephin in the emergency department.  Will add on Keflex to her doxycycline.  Given that the patient is otherwise well-appearing concern for chronic skin changes from her known cellulitis.  Discussed reevaluation with primary care physician in 2 days after starting Keflex.  Discussed return for any worsening symptoms or fever.   Corena Herter, MD 03/09/23 [phone]

## 2023-03-09 NOTE — ED Triage Notes (Signed)
First Nurse Note: Patient to ED via [facility] from [facility]. Sent for left leg cellulitis- has been taking oral antibiotic since 1/14 with no change and now having diarrhea. VS WNL with EMS.

## 2023-05-30 ENCOUNTER — Emergency Department

## 2023-05-30 ENCOUNTER — Other Ambulatory Visit: Payer: Self-pay

## 2023-05-30 DIAGNOSIS — R0789 Other chest pain: Secondary | ICD-10-CM | POA: Diagnosis not present

## 2023-05-30 DIAGNOSIS — R079 Chest pain, unspecified: Secondary | ICD-10-CM | POA: Diagnosis present

## 2023-05-30 LAB — CBC
HCT: 34.8 % — ABNORMAL LOW (ref 36.0–46.0)
Hemoglobin: 11.2 g/dL — ABNORMAL LOW (ref 12.0–15.0)
MCH: 26.9 pg (ref 26.0–34.0)
MCHC: 32.2 g/dL (ref 30.0–36.0)
MCV: 83.5 fL (ref 80.0–100.0)
Platelets: 195 10*3/uL (ref 150–400)
RBC: 4.17 MIL/uL (ref 3.87–5.11)
RDW: 14.7 % (ref 11.5–15.5)
WBC: 7.1 10*3/uL (ref 4.0–10.5)
nRBC: 0 % (ref 0.0–0.2)

## 2023-05-30 NOTE — ED Triage Notes (Signed)
 Pt to ED via POV from [facility] c/o left sided CP. Says she doesn't remember exactly when it started. Pt was given 2 nitroglycerin around 9:14pm and 9:56pm. Pain still has not gotten any better. Endorses SHOB, and dizziness. Hx of CHF COPD, and heart cath in 2016.

## 2023-05-31 ENCOUNTER — Emergency Department
Admission: EM | Admit: 2023-05-31 | Discharge: 2023-05-31 | Disposition: A | Discharge status: Skilled Nursing Facility | Attending: Emergency Medicine | Admitting: Emergency Medicine

## 2023-05-31 DIAGNOSIS — R0789 Other chest pain: Secondary | ICD-10-CM | POA: Diagnosis not present

## 2023-05-31 LAB — BASIC METABOLIC PANEL WITH GFR
Anion gap: 8 (ref 5–15)
BUN: 13 mg/dL (ref 8–23)
CO2: 29 mmol/L (ref 22–32)
Calcium: 9.1 mg/dL (ref 8.9–10.3)
Chloride: 98 mmol/L (ref 98–111)
Creatinine, Ser: 0.71 mg/dL (ref 0.44–1.00)
GFR, Estimated: >60 mL/min (ref >60)
Glucose, Bld: 119 mg/dL — ABNORMAL HIGH (ref 70–99)
Potassium: 3.3 mmol/L — ABNORMAL LOW (ref 3.5–5.1)
Sodium: 135 mmol/L (ref 135–145)

## 2023-05-31 LAB — HEPATIC FUNCTION PANEL
ALT: 17 U/L (ref 0–44)
AST: 13 U/L — ABNORMAL LOW (ref 15–41)
Albumin: 3.3 g/dL — ABNORMAL LOW (ref 3.5–5.0)
Alkaline Phosphatase: 104 U/L (ref 38–126)
Bilirubin, Direct: <0.1 mg/dL (ref 0.0–0.2)
Total Bilirubin: 0.4 mg/dL (ref 0.0–1.2)
Total Protein: 6.4 g/dL — ABNORMAL LOW (ref 6.5–8.1)

## 2023-05-31 LAB — LIPASE, BLOOD: Lipase: 29 U/L (ref 11–51)

## 2023-05-31 LAB — TROPONIN I (HIGH SENSITIVITY)
Troponin I (High Sensitivity): 3 ng/L (ref <18)
Troponin I (High Sensitivity): <2 ng/L (ref <18)

## 2023-05-31 MED ORDER — ALUM & MAG HYDROXIDE-SIMETH 200-200-20 MG/5ML PO SUSP
30.0000 mL | Freq: Once | ORAL | Status: AC
  Administered 2023-05-31: 30 mL via ORAL
  Filled 2023-05-31: qty 30

## 2023-05-31 NOTE — ED Notes (Signed)
 Written and verbal discharge instructions reviewed with patient and family member at bedside, understanding verbalized, denied questions. Discharged from unit in good condition via wheelchair with family member.

## 2023-05-31 NOTE — ED Provider Notes (Signed)
 [facility] Provider Note    Event Date/Time   First MD Initiated Contact with Patient 05/31/23 0126     (approximate)   History   Chest Pain   HPI Cristina [name] is a 63 y.o. female with an extensive past medical history of but no documented history of ACS.  She presents from her nursing facility for evaluation of chest pain.  She reports that she developed some pain in the middle of her chest that goes up almost to the base of her throat.  She said it is sharp and it hurts worse if she pushes on it.  No nausea or vomiting.  Feels similar to prior pain.  She took 2 nitroglycerin at the facility and then had her daughter bring her to the ED.  She is not having any shortness of breath and is using her chronic supplemental oxygen of 2 L by nasal cannula.  She denies any abdominal pain.  No lightheadedness or dizziness.  No numbness or tingling in her extremities and no radiation of pain to any other part of her body.  Her daughter said that she took the patient out today and that she indulged in quite a bit of soda and coffee, more than is usual for her.     Physical Exam   Triage Vital Signs: ED Triage Vitals  Encounter Vitals Group     BP 05/30/23 2324 129/72     Systolic BP Percentile --      Diastolic BP Percentile --      Pulse Rate 05/30/23 2324 69     Resp 05/30/23 2324 18     Temp 05/30/23 2324 97.6 F (36.4 C)     Temp Source 05/30/23 2324 Oral     SpO2 05/30/23 2324 99 %     Weight 05/30/23 2321 102.1 kg (225 lb)     Height 05/30/23 2321 1.448 m (4\' 9" )     Head Circumference --      Peak Flow --      Pain Score 05/30/23 2321 10     Pain Loc --      Pain Education --      Exclude from Growth Chart --     Most recent vital signs: Vitals:   05/30/23 2324  BP: 129/72  Pulse: 69  Resp: 18  Temp: 97.6 F (36.4 C)  SpO2: 99%    General: Awake, appears chronically ill but in no acute distress.  Conversant and joking with  me. CV:  Good peripheral perfusion.  Normal heart sounds, regular rate and rhythm. Resp:  Normal effort. Speaking easily and comfortably, no accessory muscle usage nor intercostal retractions.  Lungs are clear to auscultation with no wheezing despite history of COPD and use of supplemental oxygen. Abd:  Obese.  No tenderness to palpation throughout the abdomen and no guarding.   ED Results / Procedures / Treatments   Labs (all labs ordered are listed, but only abnormal results are displayed) Labs Reviewed  BASIC METABOLIC PANEL WITH GFR - Abnormal; Notable for the following components:      Result Value   Potassium 3.3 (*)    Glucose, Bld 119 (*)    All other components within normal limits  CBC - Abnormal; Notable for the following components:   Hemoglobin 11.2 (*)    HCT 34.8 (*)    All other components within normal limits  HEPATIC FUNCTION PANEL - Abnormal; Notable for the following components:  Total Protein 6.4 (*)    Albumin 3.3 (*)    AST 13 (*)    All other components within normal limits  LIPASE, BLOOD  TROPONIN I (HIGH SENSITIVITY)  TROPONIN I (HIGH SENSITIVITY)     EKG  ED ECG REPORT I, Lynnda Sas, the attending physician, personally viewed and interpreted this ECG.  Date: 05/30/2023 EKG Time: 23: 26 Rate: 65 Rhythm: Sinus rhythm with first-degree AV block QRS Axis: normal Intervals: Incomplete right bundle branch block ST/T Wave abnormalities: Non-specific ST segment / T-wave changes, but no clear evidence of acute ischemia. Narrative Interpretation: no definitive evidence of acute ischemia; does not meet STEMI criteria.    RADIOLOGY I viewed and interpreted the patient's chest x-ray and there is no evidence of pneumonia or pulmonary edema or widened mediastinum.  I also read the radiologist's report, which confirmed no acute findings.   PROCEDURES:  Critical Care performed: No  .1-3 Lead EKG Interpretation  Performed by: Lynnda Sas,  MD Authorized by: Lynnda Sas, MD     Interpretation: normal     ECG rate:  69   ECG rate assessment: normal     Rhythm: sinus rhythm     Ectopy: none     Conduction: normal       IMPRESSION / MDM / ASSESSMENT AND PLAN / ED COURSE  I reviewed the triage vital signs and the nursing notes.                              Differential diagnosis includes, but is not limited to, GI related atypical chest pain, angina, ACS, PE, pneumonia, pneumothorax, costochondritis, musculoskeletal strain.  Patient's presentation is most consistent with acute presentation with potential threat to life or bodily function.  Labs/studies ordered: HS troponin x 2, lipase, hepatic function panel, BMP, cbc, chest 2 view  Interventions/Medications given:  Medications  alum & mag hydroxide-simeth (MAALOX/MYLANTA) 200-200-20 MG/5ML suspension 30 mL (has no administration in time range)    (Note:  hospital course my include additional interventions and/or labs/studies not listed above.)   Reassuring EKG and first high-sensitivity troponin.  Rest of her labs are essentially normal.  Pain is somewhat reproducible and seems to be more esophageal than cardiac.  Daughter confirmed that this is not uncommon for her.  She has risk factors but I read the catheterization report from 9 years ago performed by Dr. Meredeth Stallion and she did not have severe disease at that time.  She has a well score for PE is 0 and I think that she is at low risk of this being a cardiac event currently.  I ordered Mylanta and discussed outpatient follow-up with the patient and her daughter.  They agree with the plan as long as the second high-sensitivity troponin is negative.  The patient is on the cardiac monitor to evaluate for evidence of arrhythmia and/or significant heart rate changes.   Clinical Course as of 05/31/23 0310  Mon May 31, 2023  0308 Repeat high-sensitivity troponin is negative.  I updated the patient and her daughter and they  are comfortable with the plan for discharge.  I helped her to the bathroom as well and she is able to ambulate at baseline without any substantial change in symptoms.h  e patient's medical screening exam is reassuring with no indication of an emergent medical condition requiring hospitalization or additional evaluation at this point.  The patient is safe and appropriate for discharge and outpatient  follow up. [CF]    Clinical Course User Index [CF] Lynnda Sas, MD     FINAL CLINICAL IMPRESSION(S) / ED DIAGNOSES   Final diagnoses:  Atypical chest pain     Rx / DC Orders   ED Discharge Orders          Ordered    Ambulatory referral to Cardiology       Comments: If you have not heard from the Cardiology office within the next 72 hours please call [phone].   05/31/23 0307             Note:  This document was prepared using Dragon voice recognition software and may include unintentional dictation errors.   Lynnda Sas, MD 05/31/23 [phone]

## 2023-05-31 NOTE — Discharge Instructions (Signed)

## 2023-07-01 ENCOUNTER — Other Ambulatory Visit: Payer: Self-pay

## 2023-07-01 ENCOUNTER — Encounter: Payer: Self-pay | Admitting: Emergency Medicine

## 2023-07-01 ENCOUNTER — Emergency Department

## 2023-07-01 ENCOUNTER — Emergency Department
Admission: EM | Admit: 2023-07-01 | Discharge: 2023-07-01 | Disposition: A | Discharge status: Home / Self Care | Attending: Emergency Medicine | Admitting: Emergency Medicine

## 2023-07-01 DIAGNOSIS — J449 Chronic obstructive pulmonary disease, unspecified: Secondary | ICD-10-CM | POA: Diagnosis not present

## 2023-07-01 DIAGNOSIS — R935 Abnormal findings on diagnostic imaging of other abdominal regions, including retroperitoneum: Secondary | ICD-10-CM | POA: Insufficient documentation

## 2023-07-01 DIAGNOSIS — E119 Type 2 diabetes mellitus without complications: Secondary | ICD-10-CM | POA: Insufficient documentation

## 2023-07-01 LAB — COMPREHENSIVE METABOLIC PANEL WITH GFR
ALT: 14 U/L (ref 0–44)
AST: 15 U/L (ref 15–41)
Albumin: 3.3 g/dL — ABNORMAL LOW (ref 3.5–5.0)
Alkaline Phosphatase: 103 U/L (ref 38–126)
Anion gap: 11 (ref 5–15)
BUN: 9 mg/dL (ref 8–23)
CO2: 27 mmol/L (ref 22–32)
Calcium: 8.7 mg/dL — ABNORMAL LOW (ref 8.9–10.3)
Chloride: 94 mmol/L — ABNORMAL LOW (ref 98–111)
Creatinine, Ser: 0.73 mg/dL (ref 0.44–1.00)
GFR, Estimated: >60 mL/min (ref >60)
Glucose, Bld: 193 mg/dL — ABNORMAL HIGH (ref 70–99)
Potassium: 3.6 mmol/L (ref 3.5–5.1)
Sodium: 132 mmol/L — ABNORMAL LOW (ref 135–145)
Total Bilirubin: 0.5 mg/dL (ref 0.0–1.2)
Total Protein: 6.1 g/dL — ABNORMAL LOW (ref 6.5–8.1)

## 2023-07-01 LAB — CBC
HCT: 33.6 % — ABNORMAL LOW (ref 36.0–46.0)
Hemoglobin: 11.1 g/dL — ABNORMAL LOW (ref 12.0–15.0)
MCH: 27 pg (ref 26.0–34.0)
MCHC: 33 g/dL (ref 30.0–36.0)
MCV: 81.8 fL (ref 80.0–100.0)
Platelets: 209 10*3/uL (ref 150–400)
RBC: 4.11 MIL/uL (ref 3.87–5.11)
RDW: 14.6 % (ref 11.5–15.5)
WBC: 5.9 10*3/uL (ref 4.0–10.5)
nRBC: 0 % (ref 0.0–0.2)

## 2023-07-01 LAB — LIPASE, BLOOD: Lipase: 35 U/L (ref 11–51)

## 2023-07-01 MED ORDER — IOHEXOL 300 MG/ML  SOLN
100.0000 mL | Freq: Once | INTRAMUSCULAR | Status: AC | PRN
  Administered 2023-07-01: 100 mL via INTRAVENOUS

## 2023-07-01 NOTE — ED Notes (Signed)
 First nurse note: Pt here via [facility] from [facility] in [place]. Pt had CT and was resulted as a SBO, HX of ileus.

## 2023-07-01 NOTE — ED Triage Notes (Signed)
 Patient to ED via [facility] from [facility] for abnormal CT. CT scan showed at SBO. Denies pain at this time.

## 2023-07-01 NOTE — ED Notes (Signed)
 Pt voided upon return, no sample saved. Encouraged to call for assistance when she thinks she can go again. Given PO fluids/ clear liquids per request.

## 2023-07-01 NOTE — Discharge Instructions (Signed)
 Your CT scan today does not show any signs of a bowel obstruction, acute inflammation, or any other concerning findings.  Follow-up with your primary care provider.  Return to the ER for new, worsening, or persistent severe abdominal pain, vomiting, or any other new or worsening symptoms that concern you.

## 2023-07-01 NOTE — ED Notes (Signed)
 Back from CT, alert, NAD, calm, interactive. No changes.

## 2023-07-01 NOTE — ED Notes (Signed)
 Pt to CT, alert, NAD, calm, interactive, no changes.

## 2023-07-01 NOTE — ED Provider Notes (Signed)
 [facility] Provider Note    Event Date/Time   First MD Initiated Contact with Patient 07/01/23 1556     (approximate)   History   Abnormal Imaging   HPI  Cristina [name] is a 63 y.o. female with a history of iron deficiency anemia, COPD, type 2 diabetes, hyperlipidemia, and schizoaffective disorder who presents with an abnormal x-ray concerning for bowel obstruction.  The patient states that she has had some cramping to her abdomen into her ribs.  She reports some intermittent nausea but no vomiting.  She is constipated but still passing stools and had a bowel movement today.  She denies any other acute symptoms.  I reviewed the past medical records.  The patient's most recent outpatient encounter was on 5/1 with Dr. Jamal Mays from pulmonology for follow-up of her COPD and OSA.  The patient was also sent over with paper records.  She had a 1 view abdomen x-ray on 5/8 which showed a dilated air and fluid-filled stomach as well as dilated air-filled small and large bowel loops.  These findings were concerning for possible gastric outlet obstruction, gastroparesis, ileus, or gastroenteritis.   Physical Exam   Triage Vital Signs: ED Triage Vitals  Encounter Vitals Group     BP 07/01/23 1435 117/65     Systolic BP Percentile --      Diastolic BP Percentile --      Pulse Rate 07/01/23 1435 73     Resp 07/01/23 1435 17     Temp 07/01/23 1435 98.3 F (36.8 C)     Temp Source 07/01/23 1435 Oral     SpO2 07/01/23 1435 94 %     Weight 07/01/23 1436 220 lb (99.8 kg)     Height 07/01/23 1436 4\' 9"  (1.448 m)     Head Circumference --      Peak Flow --      Pain Score 07/01/23 1436 0     Pain Loc --      Pain Education --      Exclude from Growth Chart --     Most recent vital signs: Vitals:   07/01/23 1800 07/01/23 1830  BP: 127/60 125/70  Pulse: 81 76  Resp:    Temp:    SpO2: 99% 98%     General: Alert, well-appearing, no distress.  CV:  Good  peripheral perfusion.  Resp:  Normal effort.  Abd:  Soft with no focal tenderness.  No distention.  Other:  No jaundice or scleral icterus.   ED Results / Procedures / Treatments   Labs (all labs ordered are listed, but only abnormal results are displayed) Labs Reviewed  COMPREHENSIVE METABOLIC PANEL WITH GFR - Abnormal; Notable for the following components:      Result Value   Sodium 132 (*)    Chloride 94 (*)    Glucose, Bld 193 (*)    Calcium  8.7 (*)    Total Protein 6.1 (*)    Albumin 3.3 (*)    All other components within normal limits  CBC - Abnormal; Notable for the following components:   Hemoglobin 11.1 (*)    HCT 33.6 (*)    All other components within normal limits  LIPASE, BLOOD  URINALYSIS, ROUTINE W REFLEX MICROSCOPIC     EKG     RADIOLOGY  CT abdomen/pelvis: I independently viewed and interpreted the images; there are no dilated bowel loops or any free air or free fluid.  Radiology report indicates the following:  IMPRESSION:  1. No acute intra-abdominal or pelvic pathology. No bowel  obstruction. Normal appendix.  2.  Aortic Atherosclerosis (ICD10-I70.0).    PROCEDURES:  Critical Care performed: No  Procedures   MEDICATIONS ORDERED IN ED: Medications  iohexol  (OMNIPAQUE ) 300 MG/ML solution 100 mL (100 mLs Intravenous Contrast Given 07/01/23 1736)     IMPRESSION / MDM / ASSESSMENT AND PLAN / ED COURSE  I reviewed the triage vital signs and the nursing notes.  63 year old female with PMH as noted above presents for further evaluation after a recent x-ray showed abnormal gastric and bowel findings.  On exam the patient is quite well-appearing.  Her vital signs are normal.  The abdomen is soft with no focal tenderness.  She states that she has not vomited and is having bowel movements.  Differential diagnosis includes, but is not limited to, gastroenteritis, gastritis, gastroparesis, constipation, ileus, bowel obstruction.  Will obtain lab  workup, CT, and reassess.  Patient's presentation is most consistent with acute complicated illness / injury requiring diagnostic workup.  The patient is on the cardiac monitor to evaluate for evidence of arrhythmia and/or significant heart rate changes.  ----------------------------------------- 7:44 PM on 07/01/2023 -----------------------------------------  Labs are unremarkable.  CBC shows no leukocytosis.  CMP shows normal LFTs and electrolytes.  Lipase is negative.  The patient has not yet provided a urine sample, but she denies any dysuria, frequency, or other urinary symptoms.  She has no acute abdominal pain at this time.  CT is negative for acute findings.  At this time, the patient is asymptomatic and stable for discharge home.  I counseled her and her daughter on the results of the workup.  I gave strict return precautions, and they expressed understanding.    FINAL CLINICAL IMPRESSION(S) / ED DIAGNOSES   Final diagnoses:  Abnormal x-ray of abdomen     Rx / DC Orders   ED Discharge Orders     None        Note:  This document was prepared using Dragon voice recognition software and may include unintentional dictation errors.    Lind Repine, MD 07/01/23 1944

## 2023-07-01 NOTE — ED Notes (Signed)
 EDP at [facility]

## 2023-09-14 ENCOUNTER — Other Ambulatory Visit: Payer: Self-pay | Admitting: Gastroenterology

## 2023-09-14 DIAGNOSIS — R633 Feeding difficulties, unspecified: Secondary | ICD-10-CM

## 2023-09-14 DIAGNOSIS — R1312 Dysphagia, oropharyngeal phase: Secondary | ICD-10-CM

## 2023-09-17 ENCOUNTER — Ambulatory Visit
Admission: RE | Admit: 2023-09-17 | Discharge: 2023-09-17 | Disposition: A | Discharge status: Home / Self Care | Source: Ambulatory Visit | Attending: Gastroenterology | Admitting: Gastroenterology

## 2023-09-17 DIAGNOSIS — R633 Feeding difficulties, unspecified: Secondary | ICD-10-CM | POA: Diagnosis present

## 2023-09-17 DIAGNOSIS — R1312 Dysphagia, oropharyngeal phase: Secondary | ICD-10-CM | POA: Insufficient documentation

## 2023-09-17 NOTE — Procedures (Signed)
 Modified Barium Swallow Study  Patient Details  Name: Cristina [name] MRN: [id number] Date of Birth: October 12, 1960  Today's Date: 09/17/2023  Modified Barium Swallow completed.  Full report located under Chart Review in the Imaging Section.  History of Present Illness Pt is a 63 year old female who was referred for a Modified Barium Swallow Study by her Gastroenterologist, Delmar Pike, d/t pt report of globus sensation when consuming meats. Oropharyngeal dysphagia- recommend getting dentures fixed/refitted. Soft foods. Chew well eat slowly. MBSS. She had 2 upper endoscopies last year without any esophageal strictures - could consider manometry/barium swallow as clinical situation presents.   Clinical Impression Pt arrived to this study without dentures and reports difficulty swallowing meats and toast. While she consumed the barium tablet without incident, she reports difficulty taking medicine whole. She also reports that she doesn't eat hard solids, therefore graham cracker was not provided per pt preference.   During this study pt presented with adequate oropharyngeal abilities when consuming puree, thin liquids via cup and straw sips as well as whole barium tablet with thin liquids via straw. Education was provided to pt on these results with all questions answered to her satisfaction. No further services are indicated. Factors that may increase risk of adverse event in presence of aspiration Noe & Lianne 2021):  any esophageal dysphagia  Swallow Evaluation Recommendations Recommendations: PO diet PO Diet Recommendation: Dysphagia 3 (Mechanical soft);Thin liquids (Level 0) (per pt preference) Liquid Administration via: Straw;Cup Medication Administration: Whole meds with liquid Supervision: Patient able to self-feed Swallowing strategies  : Minimize environmental distractions;Slow rate;Small bites/sips Postural changes: Position pt fully upright for meals;Stay upright 30-60  min after meals Oral care recommendations: Oral care BID (2x/day)   Dellanira Dillow B. Rubbie, M.S., CCC-SLP, Tree surgeon Certified Brain Injury Specialist [facility]  [facility] Office [phone] Ascom [phone] Fax [phone]

## 2024-01-26 ENCOUNTER — Emergency Department: Admission: EM | Admit: 2024-01-26 | Discharge: 2024-01-27 | Disposition: A | Discharge status: Home / Self Care

## 2024-01-26 ENCOUNTER — Encounter: Payer: Self-pay | Admitting: Emergency Medicine

## 2024-01-26 ENCOUNTER — Other Ambulatory Visit: Payer: Self-pay

## 2024-01-26 DIAGNOSIS — Z79899 Other long term (current) drug therapy: Secondary | ICD-10-CM | POA: Diagnosis not present

## 2024-01-26 DIAGNOSIS — J449 Chronic obstructive pulmonary disease, unspecified: Secondary | ICD-10-CM | POA: Diagnosis not present

## 2024-01-26 DIAGNOSIS — F332 Major depressive disorder, recurrent severe without psychotic features: Secondary | ICD-10-CM | POA: Insufficient documentation

## 2024-01-26 DIAGNOSIS — F32A Depression, unspecified: Secondary | ICD-10-CM | POA: Diagnosis present

## 2024-01-26 DIAGNOSIS — E119 Type 2 diabetes mellitus without complications: Secondary | ICD-10-CM | POA: Insufficient documentation

## 2024-01-26 DIAGNOSIS — R45851 Suicidal ideations: Secondary | ICD-10-CM | POA: Insufficient documentation

## 2024-01-26 DIAGNOSIS — F4321 Adjustment disorder with depressed mood: Secondary | ICD-10-CM | POA: Insufficient documentation

## 2024-01-26 LAB — URINE DRUG SCREEN
Amphetamines: NEGATIVE
Barbiturates: NEGATIVE
Benzodiazepines: POSITIVE — AB
Cocaine: NEGATIVE
Fentanyl: NEGATIVE
Methadone Scn, Ur: NEGATIVE
Opiates: NEGATIVE
Tetrahydrocannabinol: NEGATIVE

## 2024-01-26 LAB — CBC
HCT: 37.8 % (ref 36.0–46.0)
Hemoglobin: 11.8 g/dL — ABNORMAL LOW (ref 12.0–15.0)
MCH: 25.1 pg — ABNORMAL LOW (ref 26.0–34.0)
MCHC: 31.2 g/dL (ref 30.0–36.0)
MCV: 80.4 fL (ref 80.0–100.0)
Platelets: 230 K/uL (ref 150–400)
RBC: 4.7 MIL/uL (ref 3.87–5.11)
RDW: 15.5 % (ref 11.5–15.5)
WBC: 8.5 K/uL (ref 4.0–10.5)
nRBC: 0 % (ref 0.0–0.2)

## 2024-01-26 LAB — ETHANOL: Alcohol, Ethyl (B): <15 mg/dL (ref <15)

## 2024-01-26 LAB — COMPREHENSIVE METABOLIC PANEL WITH GFR
ALT: 13 U/L (ref 0–44)
AST: 13 U/L — ABNORMAL LOW (ref 15–41)
Albumin: 3.4 g/dL — ABNORMAL LOW (ref 3.5–5.0)
Alkaline Phosphatase: 135 U/L — ABNORMAL HIGH (ref 38–126)
Anion gap: 11 (ref 5–15)
BUN: 11 mg/dL (ref 8–23)
CO2: 29 mmol/L (ref 22–32)
Calcium: 9 mg/dL (ref 8.9–10.3)
Chloride: 99 mmol/L (ref 98–111)
Creatinine, Ser: 0.82 mg/dL (ref 0.44–1.00)
GFR, Estimated: >60 mL/min (ref >60)
Glucose, Bld: 176 mg/dL — ABNORMAL HIGH (ref 70–99)
Potassium: 3.9 mmol/L (ref 3.5–5.1)
Sodium: 139 mmol/L (ref 135–145)
Total Bilirubin: <0.2 mg/dL (ref 0.0–1.2)
Total Protein: 6.8 g/dL (ref 6.5–8.1)

## 2024-01-26 MED ORDER — METOPROLOL TARTRATE 25 MG PO TABS
12.5000 mg | ORAL_TABLET | Freq: Two times a day (BID) | ORAL | Status: DC
  Administered 2024-01-27: 12.5 mg via ORAL
  Filled 2024-01-26: qty 1

## 2024-01-26 MED ORDER — FLUTICASONE FUROATE-VILANTEROL 100-25 MCG/ACT IN AEPB
1.0000 | INHALATION_SPRAY | Freq: Every day | RESPIRATORY_TRACT | Status: DC
  Filled 2024-01-26: qty 28

## 2024-01-26 MED ORDER — ALBUTEROL SULFATE (2.5 MG/3ML) 0.083% IN NEBU: 2.5000 mg | INHALATION_SOLUTION | Freq: Four times a day (QID) | RESPIRATORY_TRACT | Status: DC | PRN

## 2024-01-26 MED ORDER — FUROSEMIDE 40 MG PO TABS: 40.0000 mg | ORAL_TABLET | Freq: Every day | ORAL | Status: DC

## 2024-01-26 MED ORDER — PREGABALIN 50 MG PO CAPS
100.0000 mg | ORAL_CAPSULE | Freq: Three times a day (TID) | ORAL | Status: DC
  Administered 2024-01-27: 100 mg via ORAL
  Filled 2024-01-26: qty 2

## 2024-01-26 MED ORDER — CLONAZEPAM 0.5 MG PO TABS
0.5000 mg | ORAL_TABLET | Freq: Two times a day (BID) | ORAL | Status: DC
  Administered 2024-01-27: 0.5 mg via ORAL
  Filled 2024-01-26: qty 1

## 2024-01-26 MED ORDER — ASPIRIN 81 MG PO TBEC: 81.0000 mg | DELAYED_RELEASE_TABLET | Freq: Every day | ORAL | Status: DC

## 2024-01-26 MED ORDER — ATORVASTATIN CALCIUM 20 MG PO TABS
20.0000 mg | ORAL_TABLET | Freq: Every day | ORAL | Status: DC
  Administered 2024-01-27: 20 mg via ORAL
  Filled 2024-01-26: qty 1

## 2024-01-26 MED ORDER — OXYBUTYNIN CHLORIDE ER 10 MG PO TB24: 10.0000 mg | ORAL_TABLET | Freq: Every day | ORAL | Status: DC

## 2024-01-26 NOTE — ED Notes (Signed)
 Patients belongings include- Black purse Black wallet Multiple rings (7), necklace and earrings placed in specimen cup White watch Pink beanie Black sneakers Blue socks Patterned dress Blue sports bra

## 2024-01-26 NOTE — ED Triage Notes (Signed)
 Pt comes via EMS from  [facility] c/o SI. Pt state she wants to kill herself and no plan currently. Pt is A*OX4.  Pt did have psychologist appt and they referred her here.

## 2024-01-26 NOTE — BH Assessment (Signed)
 Comprehensive Clinical Assessment (CCA) Screening, Triage and Referral Note  01/26/2024 Cristina [name] [id number] Recommendations for Services/Supports/Treatments: Psych consult/Disposition pending. Cristina [name] is a 63 year old, English speaking, white female. Pt presented to [facility] ED voluntarily due. Per triage note: Pt comes via EMS from [facility] c/o SI. Pt state she wants to kill herself and no plan currently. Pt is A*OX4. Pt did have psychologist appt and they referred her here.  The pt reported that she complies with her depression medications, but is unsure of the effectiveness, explaining that she may need a med adjustment. Pt explained that she was brought in due to experiencing SI. Pt reported that she'd lost her daughter around this time about 1 year ago. Pt reported that she suffers from fatigue, sleep disturbance, poor concentration, and despair. Pt explained that she has chronic depression, however her grief emotions have intensified her depression. Pt explained that feelings of being unable to cope with the pain of loss and feeling overwhelmed result in her asking for help. Pt explained that the wave of pain subsided after she cried, explaining that she feels relieved now. Pt reported that she has attempted suicide in the past; however, she did/does not have a plan or intent of suicide, currently. Pt expressed a desire to return to her assisted living facility so she could be with her friends. Pt was attentive and cooperative throughout the assessment. Pt had good insight and judgement. Pt had slow but coherent speech and was seemingly lucid. Pt was oriented x4 and did not appear to be responding to internal/external stimuli. Pt presented with depressed mood; affect was congruent. Pt denied substance use. Pt denied current SI/HI/AV/H. Pt is [facility] to [facility] ACT Team.   Chief Complaint:  Chief Complaint  Patient presents with   Suicidal   Depression   Visit  Diagnosis: MDD recurrent, severe Grief  Patient Reported Information How did you hear about us ? No data recorded What Is the Reason for Your Visit/Call Today? No data recorded How Long Has This Been Causing You Problems? No data recorded What Do You Feel Would Help You the Most Today? No data recorded  Have You Recently Had Any Thoughts About Hurting Yourself? No data recorded Are You Planning to Commit Suicide/Harm Yourself At This time? No data recorded  Have you Recently Had Thoughts About Hurting Someone Sherral? No data recorded Are You Planning to Harm Someone at This Time? No data recorded Explanation: No data recorded  Have You Used Any Alcohol or Drugs in the Past 24 Hours? No data recorded How Long Ago Did You Use Drugs or Alcohol? No data recorded What Did You Use and How Much? No data recorded  Do You Currently Have a Therapist/Psychiatrist? No data recorded Name of Therapist/Psychiatrist: No data recorded  Have You Been Recently Discharged From Any Office Practice or Programs? No data recorded Explanation of Discharge From Practice/Program: No data recorded   CCA Screening Triage Referral Assessment Type of Contact: No data recorded Telemedicine Service Delivery:   Is this Initial or Reassessment?   Date Telepsych consult ordered in [facility]:    Time Telepsych consult ordered in [facility]:    Location of Assessment: No data recorded Provider Location: No data recorded   Collateral Involvement: No data recorded  Does Patient Have a Court Appointed Legal Guardian? No data recorded Name and Contact of Legal Guardian: No data recorded If Minor and Not Living with Parent(s), Who has Custody? No data recorded Is CPS involved  or ever been involved? No data recorded Is APS involved or ever been involved? No data recorded  Patient Determined To Be At Risk for Harm To Self or Others Based on Review of Patient Reported Information or Presenting Complaint? No data recorded Method: No  data recorded Availability of Means: No data recorded Intent: No data recorded Notification Required: No data recorded Additional Information for Danger to Others Potential: No data recorded Additional Comments for Danger to Others Potential: No data recorded Are There Guns or Other Weapons in Your Home? No data recorded Types of Guns/Weapons: No data recorded Are These Weapons Safely Secured?                            No data recorded Who Could Verify You Are Able To Have These Secured: No data recorded Do You Have any Outstanding Charges, Pending Court Dates, Parole/Probation? No data recorded Contacted To Inform of Risk of Harm To Self or Others: No data recorded  Does Patient Present under Involuntary Commitment? No data recorded   [place] of Residence: No data recorded  Patient Currently Receiving the Following Services: No data recorded  Determination of Need: No data recorded  Options For Referral: No data recorded  Disposition Recommendation per psychiatric provider: Pending psych consult.   Breklyn Fabrizio R Elisabet Gutzmer, LCAS

## 2024-01-26 NOTE — ED Provider Notes (Signed)
 [facility] Provider Note    Event Date/Time   First MD Initiated Contact with Patient 01/26/24 1626     (approximate)   History   Suicidal and Depression   HPI  Cristina [name] is a 63 y.o. female  with a history of iron deficiency anemia, COPD, type 2 diabetes, hyperlipidemia, and schizoaffective disorder who presents via EMS from [facility] complaining of suicidal ideation.  She states that she does not want to live any longer.  Patient denies any physical complaints.  She she does have a psychologist but her assisted living sent told to emergency department.  Patient states that she does not have a plan but she is just tired.  She believes her sugars have been running within normal limits      Physical Exam   Triage Vital Signs: ED Triage Vitals  Encounter Vitals Group     BP 01/26/24 1559 (!) 114/96     Girls Systolic BP Percentile --      Girls Diastolic BP Percentile --      Boys Systolic BP Percentile --      Boys Diastolic BP Percentile --      Pulse Rate 01/26/24 1559 88     Resp 01/26/24 1559 20     Temp 01/26/24 1559 98 F (36.7 C)     Temp Source 01/26/24 1559 Oral     SpO2 01/26/24 1559 94 %     Weight 01/26/24 1600 220 lb (99.8 kg)     Height 01/26/24 1600 4' 9 (1.448 m)     Head Circumference --      Peak Flow --      Pain Score 01/26/24 1600 4     Pain Loc --      Pain Education --      Exclude from Growth Chart --     Most recent vital signs: Vitals:   01/26/24 1559  BP: (!) 114/96  Pulse: 88  Resp: 20  Temp: 98 F (36.7 C)  SpO2: 94%    Nursing Triage Note reviewed. Vital signs reviewed and patients oxygen saturation is normoxic  General: Patient is well nourished, well developed, awake and alert, resting comfortably in no acute distress Head: Normocephalic and atraumatic Eyes: Normal inspection, extraocular muscles intact, no conjunctival pallor Ear, nose, throat: Normal external  exam Neck: Normal range of motion Respiratory: Patient is in no respiratory distress, lungs CTAB Cardiovascular: Patient is not tachycardic, RR GI: Abd SNT with no guarding or rebound  Back: Normal inspection of the back with good strength and range of motion throughout all ext Extremities: pulses intact with good cap refills, no LE pitting edema or calf tenderness Neuro: The patient is alert and oriented to person, place, and time, appropriately conversive, with 5/5 bilat UE/LE strength, no gross motor or sensory defects noted. Coordination appears to be adequate. Skin: Warm, dry, and intact Psych: Depressed mood and depressed SI no HI does not seem to be responding to internal stimuli  ED Results / Procedures / Treatments   Labs (all labs ordered are listed, but only abnormal results are displayed) Labs Reviewed  COMPREHENSIVE METABOLIC PANEL WITH GFR - Abnormal; Notable for the following components:      Result Value   Glucose, Bld 176 (*)    Albumin 3.4 (*)    AST 13 (*)    Alkaline Phosphatase 135 (*)    All other components within normal limits  CBC - Abnormal;  Notable for the following components:   Hemoglobin 11.8 (*)    MCH 25.1 (*)    All other components within normal limits  ETHANOL  URINE DRUG SCREEN     EKG EKG and rhythm strip are interpreted by myself:   EKG: [Normal sinus rhythm] at heart rate of 80, normal QRS duration, QTc 399, normal ST segments and T waves no ectopy EKG not consistent with Acute STEMI Rhythm strip: NSR in lead II   RADIOLOGY None    PROCEDURES:  Critical Care performed: No  Procedures   MEDICATIONS ORDERED IN ED: Medications  albuterol  (PROVENTIL ) (2.5 MG/3ML) 0.083% nebulizer solution 2.5 mg (has no administration in time range)  aspirin  EC tablet 81 mg (has no administration in time range)  atorvastatin  (LIPITOR) tablet 20 mg (has no administration in time range)  fluticasone  furoate-vilanterol (BREO ELLIPTA ) 100-25  MCG/ACT 1 puff (has no administration in time range)  clonazePAM  (KLONOPIN ) tablet 0.5 mg (has no administration in time range)  furosemide  (LASIX ) tablet 40 mg (has no administration in time range)  metoprolol  tartrate (LOPRESSOR ) tablet 12.5 mg (has no administration in time range)  oxybutynin  (DITROPAN -XL) 24 hr tablet 10 mg (has no administration in time range)  pregabalin  (LYRICA ) capsule 100 mg (has no administration in time range)     IMPRESSION / MDM / ASSESSMENT AND PLAN / ED COURSE                                Differential diagnosis includes, but is not limited to, organic psychiatric disorder, substance use, electrolyte derangement UTI, anemia   ED course: Patient presents with suicidal ideation without a plan.  She does want to be assessed by psychiatric resources and I think she has capacity at this point to remain voluntary.  Workup noted for slightly elevated glucose but no anion gap.  No profound anemia or leukocytosis.  No elevated alcohol level.  A urine drug screen is pending and her EKG demonstrated no prolonged QTc.  At this time patient is medically cleared for psychiatric evaluation.  The patient has been placed in psychiatric observation due to the need to provide a safe environment for the patient while obtaining psychiatric consultation and evaluation, as well as ongoing medical and medication management to treat the patient's condition.  The patient has not been placed under full IVC at this time.      -- Risk: 5 This patient has a high risk of morbidity due to further diagnostic testing or treatment. Rationale: This patients evaluation and management involve a high risk of morbidity due to the potential severity of presenting symptoms, need for diagnostic testing, and/or initiation of treatment that may require close monitoring. The differential includes conditions with potential for significant deterioration or requiring escalation of care. Treatment  decisions in the ED, including medication administration, procedural interventions, or disposition planning, reflect this level of risk. COPA: 5 The patient has the following acute or chronic illness/injury that poses a possible threat to life or bodily function: [X] : The patient has a potentially serious acute condition or an acute exacerbation of a chronic illness requiring urgent evaluation and management in the Emergency Department. The clinical presentation necessitates immediate consideration of life-threatening or function-threatening diagnoses, even if they are ultimately ruled out.   FINAL CLINICAL IMPRESSION(S) / ED DIAGNOSES   Final diagnoses:  Suicidal ideation     Rx / DC Orders   ED Discharge Orders  None        Note:  This document was prepared using Dragon voice recognition software and may include unintentional dictation errors.   Nicholaus Rolland BRAVO, MD 01/26/24 [phone]

## 2024-01-26 NOTE — ED Notes (Signed)
 Pt given dinner tray and beverage

## 2024-01-26 NOTE — ED Notes (Signed)
 This tech obtained vital signs on pt.

## 2024-01-26 NOTE — Consult Note (Signed)
 [facility] Psychiatric Consult Initial  Patient Name: .Cristina [name]  MRN: [id number]  DOB: [birth year]  Consult Order details:  Orders (From admission, onward)     Start     Ordered   01/26/24 1637  CONSULT TO CALL ACT TEAM       Ordering Provider: Nicholaus Rolland BRAVO, MD  Provider:  (Not yet assigned)  Question:  Reason for Consult?  Answer:  Psych consult   01/26/24 1636   01/26/24 1637  IP CONSULT TO PSYCHIATRY       Ordering Provider: Nicholaus Rolland BRAVO, MD  Provider:  (Not yet assigned)  Question:  Reason for consult:  Answer:  Medication management   01/26/24 1636             Mode of Visit: Tele-visit Virtual Statement:TELE PSYCHIATRY ATTESTATION & CONSENT As the provider for this telehealth consult, I attest that I verified the patient's identity using two separate identifiers, introduced myself to the patient, provided my credentials, disclosed my location, and performed this encounter via a HIPAA-compliant, real-time, face-to-face, two-way, interactive audio and video platform and with the full consent and agreement of the patient (or guardian as applicable.) Patient physical location: [facility]. Telehealth provider physical location: home office in state of [place] .   Video start time:   Video end time:      Psychiatry Consult Evaluation  Service Date: January 26, 2024 LOS:  LOS: 0 days  Chief Complaint SI, Grief  Primary Psychiatric Diagnoses  Acute Grief Reaction 2.  Depression  Assessment  Cristina [name] is a 63 y.o. female admitted: Presented to the EDfor 01/26/2024  4:16 PM for evaluation of reported suicidal ideation in the context of grief and emotional distress near the anniversary of her daughters death. She carries the psychiatric diagnoses of schizoaffective disorder and has a past medical history of iron deficiency anemia, COPD, type 2 diabetes, and hyperlipidemia.  Her current presentation of expressing that she does not  want to live any longer without a plan, followed by denial of active SI during assessment is most consistent with grief-related emotional distress. She meets criteria for acute grief reaction based on recent emotional triggering around the death of her daughter, fluctuating waves of sadness, passive death wishes without suicidal intent, and no evidence of psychosis or mood decompensation.  Current outpatient psychotropic medications include those prescribed for schizoaffective disorder (exact regimen not provided), and historically she has had a generally stable response to these medications. She was reportedly compliant with medications prior to admission as evidenced by her self-report.She is psychiatrically stable.  Diagnoses:  Active Hospital problems: Active Problems:   * No active hospital problems. *    Plan   ## Psychiatric Medication Recommendations:  Continue home regimen   ## Medical Decision Making Capacity: Not specifically addressed in this encounter   ## Disposition:-- There are no psychiatric contraindications to discharge at this time  ## Behavioral / Environmental: - No specific recommendations at this time.     ## Safety and Observation Level:  - Based on my clinical evaluation, I estimate the patient to be at no risk of self harm in the current setting. - At this time, we recommend  routine. This decision is based on my review of the chart including patient's history and current presentation, interview of the patient, mental status examination, and consideration of suicide risk including evaluating suicidal ideation, plan, intent, suicidal or self-harm behaviors, risk factors, and protective factors. This judgment is based  on our ability to directly address suicide risk, implement suicide prevention strategies, and develop a safety plan while the patient is in the clinical setting. Please contact our team if there is a concern that risk level has changed.  CSSR Risk  Category:C-SSRS RISK CATEGORY: Low Risk  Suicide Risk Assessment: Patient has following modifiable risk factors for suicide: recent loss (death, isolation, vocation), which we are addressing by patient education. Patient has following non-modifiable or demographic risk factors for suicide: none Patient has the following protective factors against suicide: Frustration tolerance  Thank you for this consult request. Recommendations have been communicated to the primary team.  We will not recommend inpatient admission at this time.   Summer Mccolgan, NP       History of Present Illness  Relevant Aspects of Hospital ED Course:  Admitted on 01/26/2024 for psychiatric evaluation.   Patient Report:  A 63 year old female brought in by EMS from [facility] after staff reported that she expressed suicidal ideation, stating she does not want to live any longer. Upon arrival, the patient reports feeling emotionally exhausted, stating she is just tired. She denies having any plan or intent to harm herself.  During the psychiatric assessment, the patient denies current suicidal ideation, homicidal ideation, and auditory or visual hallucinations. She states her earlier comments were related to grief as the anniversary of her daughters death is approaching. She reports that her grief comes in waves and stages, and she was experiencing a particularly difficult moment when she verbalized not wanting to live.  She reports living in an assisted living facility and states she likes it there, noting she has made good friends. She denies any concerns about returning.  She reports a past psychiatric history of schizoaffective disorder. She denies recent mood instability or psychotic symptoms. She endorses smoking cigarettes.  She denies substance use, denies current SI/HI, and reports feeling safe. She expresses understanding that her emotional distress was grief-related and states  she feels better at this time.  Psych ROS:  Depression: yes Anxiety:  yes Mania (lifetime and current): no Psychosis: (lifetime and current): no  Review of Systems  Constitutional: Negative.   HENT: Negative.    Eyes: Negative.   Respiratory: Negative.    Cardiovascular: Negative.   Gastrointestinal: Negative.   Genitourinary: Negative.   Musculoskeletal: Negative.   Skin: Negative.   Neurological: Negative.   Psychiatric/Behavioral:  Positive for suicidal ideas.      Psychiatric and Social History  Psychiatric History:  Information collected from Patient  Prev Dx/Sx: Schizoaffective Current Psych Provider: not provided Home Meds (current): not provided Previous Med Trials: not provided Therapy: not provided  Prior Psych Hospitalization: yes  Prior Self Harm: unknown Prior Violence: unknown  Family Psych History: not provided Family Hx suicide: not provided  Social History:  Developmental Hx: unknown Educational Hx: unknown Occupational Hx: unknown Legal Hx: unknown Living Situation: Assisted Living Spiritual Hx: unknown Access to weapons/lethal means: no   Substance History Alcohol: denies  Tobacco: cigarettes Illicit drugs: denies Prescription drug abuse: denies Rehab hx: denies  Exam Findings   Vital Signs:  Temp:  [98 F (36.7 C)-98.5 F (36.9 C)] 98.5 F (36.9 C) (12/10 1946) Pulse Rate:  [86-88] 86 (12/10 1946) Resp:  [20] 20 (12/10 1946) BP: (114-130)/(74-96) 130/74 (12/10 1946) SpO2:  [92 %-94 %] 92 % (12/10 1946) Weight:  [99.8 kg] 99.8 kg (12/10 1600) Blood pressure 130/74, pulse 86, temperature 98.5 F (36.9 C), temperature source Oral, resp. rate  20, height 4' 9 (1.448 m), weight 99.8 kg, SpO2 92%. Body mass index is 47.61 kg/m.  Physical Exam HENT:     Head: Normocephalic.     Nose: Nose normal.  Eyes:     Extraocular Movements: Extraocular movements intact.  Pulmonary:     Effort: Pulmonary effort is normal.   Musculoskeletal:        General: Normal range of motion.     Cervical back: Normal range of motion.  Skin:    General: Skin is dry.  Neurological:     Mental Status: She is alert.     Other History   These have been pulled in through the EMR, reviewed, and updated if appropriate.  Family History:  The patient's family history includes CAD in her mother, sister, and sister; Lung cancer in her mother; Mental illness in her brother, mother, and sister; Mental illness (age of onset: 27) in her father.  Medical History: Past Medical History:  Diagnosis Date   Abdominal hernia    Arthritis    Asthma    Bladder dysfunction    Carpal tunnel syndrome    CHF (congestive heart failure) (HCC)    COPD (chronic obstructive pulmonary disease) (HCC)    COVID-19 virus infection 02/26/2023   DDD (degenerative disc disease), lumbar    Diabetes mellitus without complication (HCC)    Dyspnea    Edema    Fatty tumor    GERD (gastroesophageal reflux disease)    H/O colonoscopy    H/O tubal ligation    hx btl   History of cholecystectomy    hx of cholecystectomy   HLD (hyperlipidemia)    Hypertension    Hypokalemia 02/27/2023   Hyponatremia 02/27/2023   Muscle cramps    Neuropathy    Obesity    Recurrent boils    Schizoaffective disorder (HCC)    Sepsis due to cellulitis (HCC) 02/25/2023   Sleep apnea    Stroke (HCC)    UTI (lower urinary tract infection)    Vaginitis    Wears dentures    full upper and lower    Surgical History: Past Surgical History:  Procedure Laterality Date   CARDIAC CATHETERIZATION Left 09/24/2014   Procedure: Left Heart Cath and Coronary Angiography;  Surgeon: Denyse DELENA Bathe, MD;  Location: [facility] INVASIVE CV LAB;  Service: Cardiovascular;  Laterality: Left;   CATARACT EXTRACTION W/PHACO Left 05/19/2022   Procedure: CATARACT EXTRACTION PHACO AND INTRAOCULAR LENS PLACEMENT (IOC) LEFT DIABETIC 12.14 01:05.8;  Surgeon: Jaye Fallow, MD;  Location: [facility]ERY [facility];  Service: Ophthalmology;  Laterality: Left;  Diabetic   CATARACT EXTRACTION W/PHACO Right 06/02/2022   Procedure: CATARACT EXTRACTION PHACO AND INTRAOCULAR LENS PLACEMENT (IOC) RIGHT DIABETIC 9.17 00:59.2;  Surgeon: Jaye Fallow, MD;  Location: [facility] SURGERY [facility];  Service: Ophthalmology;  Laterality: Right;  Diabetic   CHOLECYSTECTOMY     COLONOSCOPY WITH PROPOFOL  N/A 08/12/2015   Procedure: COLONOSCOPY WITH PROPOFOL ;  Surgeon: Gladis RAYMOND Mariner, MD;  Location: [facility] ENDOSCOPY;  Service: Endoscopy;  Laterality: N/A;   COLONOSCOPY WITH PROPOFOL  N/A 04/23/2017   Procedure: COLONOSCOPY WITH PROPOFOL ;  Surgeon: Therisa Bi, MD;  Location: [facility] ENDOSCOPY;  Service: Endoscopy;  Laterality: N/A;   COLONOSCOPY WITH PROPOFOL  N/A 03/04/2022   Procedure: COLONOSCOPY WITH PROPOFOL ;  Surgeon: Toledo, Ladell POUR, MD;  Location: [facility] ENDOSCOPY;  Service: Gastroenterology;  Laterality: N/A;   ENTEROSCOPY N/A 05/28/2017   Procedure: SMALL BOWEL ENTEROSCOPY;  Surgeon: Unk Corinn Skiff, MD;  Location: [facility] ENDOSCOPY;  Service:  Gastroenterology;  Laterality: N/A;   ENTEROSCOPY N/A 06/04/2017   Procedure: RETROGRADE SMALL BOWEL ENTEROSCOPY;  Surgeon: Unk Corinn Skiff, MD;  Location: [facility] ENDOSCOPY;  Service: Gastroenterology;  Laterality: N/A;   ESOPHAGOGASTRODUODENOSCOPY (EGD) WITH PROPOFOL  N/A 08/12/2015   Procedure: ESOPHAGOGASTRODUODENOSCOPY (EGD) WITH PROPOFOL ;  Surgeon: Gladis RAYMOND Mariner, MD;  Location: [facility] ENDOSCOPY;  Service: Endoscopy;  Laterality: N/A;   ESOPHAGOGASTRODUODENOSCOPY (EGD) WITH PROPOFOL  N/A 03/06/2017   Procedure: ESOPHAGOGASTRODUODENOSCOPY (EGD) WITH PROPOFOL ;  Surgeon: Jinny Carmine, MD;  Location: [facility] ENDOSCOPY;  Service: Endoscopy;  Laterality: N/A;   ESOPHAGOGASTRODUODENOSCOPY (EGD) WITH PROPOFOL  N/A 03/04/2022   Procedure: ESOPHAGOGASTRODUODENOSCOPY (EGD) WITH PROPOFOL ;  Surgeon: Toledo, Ladell POUR, MD;  Location: [facility] ENDOSCOPY;  Service: Gastroenterology;   Laterality: N/A;   ESOPHAGOGASTRODUODENOSCOPY (EGD) WITH PROPOFOL  N/A 07/17/2022   Procedure: ESOPHAGOGASTRODUODENOSCOPY (EGD) WITH PROPOFOL ;  Surgeon: Maryruth Ole DASEN, MD;  Location: [facility] ENDOSCOPY;  Service: Endoscopy;  Laterality: N/A;   EYE SURGERY     GIVENS CAPSULE STUDY N/A 05/25/2017   Procedure: GIVENS CAPSULE STUDY;  Surgeon: Jinny Carmine, MD;  Location: [facility] ENDOSCOPY;  Service: Endoscopy;  Laterality: N/A;   tubal ligation       Medications:   Current Facility-Administered Medications:    albuterol  (PROVENTIL ) (2.5 MG/3ML) 0.083% nebulizer solution 2.5 mg, 2.5 mg, Inhalation, Q6H PRN, Nicholaus Rolland BRAVO, MD   [START ON 01/27/2024] aspirin  EC tablet 81 mg, 81 mg, Oral, Daily, Nicholaus, Rolland BRAVO, MD   atorvastatin  (LIPITOR) tablet 20 mg, 20 mg, Oral, QHS, Davis, Hillary E, MD   clonazePAM  (KLONOPIN ) tablet 0.5 mg, 0.5 mg, Oral, BID, Nicholaus Rolland BRAVO, MD   NOREEN ON 01/27/2024] fluticasone  furoate-vilanterol (BREO ELLIPTA ) 100-25 MCG/ACT 1 puff, 1 puff, Inhalation, Daily, Nicholaus Rolland BRAVO, MD   [START ON 01/27/2024] furosemide  (LASIX ) tablet 40 mg, 40 mg, Oral, Daily, Davis, Hillary E, MD   metoprolol  tartrate (LOPRESSOR ) tablet 12.5 mg, 12.5 mg, Oral, BID, Nicholaus Rolland BRAVO, MD   NOREEN ON 01/27/2024] oxybutynin  (DITROPAN -XL) 24 hr tablet 10 mg, 10 mg, Oral, Daily, Nicholaus Rolland E, MD   pregabalin  (LYRICA ) capsule 100 mg, 100 mg, Oral, TID, Nicholaus Rolland BRAVO, MD  Current Outpatient Medications:    acetaminophen  (TYLENOL ) 325 MG tablet, Take 650 mg by mouth every 4 (four) hours as needed., Disp: , Rfl:    acetaminophen  (TYLENOL ) 500 MG tablet, Take 500 mg by mouth every 8 (eight) hours as needed for mild pain or moderate pain., Disp: , Rfl:    albuterol  (PROVENTIL  HFA;VENTOLIN  HFA) 108 (90 Base) MCG/ACT inhaler, Inhale 2 puffs into the lungs every 6 (six) hours as needed for shortness of breath., Disp: , Rfl:    aspirin  EC 81 MG tablet, Take 81 mg by mouth daily., Disp: , Rfl:     BREO ELLIPTA  100-25 MCG/ACT AEPB, Inhale 1 puff into the lungs daily., Disp: , Rfl:    busPIRone  (BUSPAR ) 15 MG tablet, Take 15 mg by mouth 2 (two) times daily., Disp: , Rfl:    Cholecalciferol (VITAMIN D-3) 25 MCG (1000 UT) CAPS, Take 2 capsules by mouth daily., Disp: , Rfl:    clonazePAM  (KLONOPIN ) 0.5 MG tablet, Take 1 tablet (0.5 mg total) by mouth 2 (two) times daily. (Patient taking differently: Take 0.5 mg by mouth 2 (two) times daily. 0800/1400), Disp: 15 tablet, Rfl: 0   clonazePAM  (KLONOPIN ) 1 MG tablet, Take 1 mg by mouth at bedtime., Disp: , Rfl:    diclofenac  Sodium (VOLTAREN ) 1 % GEL, Apply 4 g topically 3 (three) times daily. Apply to  right knee @ 0800/1400/2000, Disp: , Rfl:    dicyclomine  (BENTYL ) 10 MG capsule, Take 10 mg by mouth 2 (two) times daily as needed., Disp: , Rfl:    ezetimibe (ZETIA) 10 MG tablet, Take 10 mg by mouth daily., Disp: , Rfl:    furosemide  (LASIX ) 40 MG tablet, Take 40 mg by mouth daily., Disp: , Rfl:    furosemide  (LASIX ) 40 MG tablet, Take 40 mg by mouth every evening. Scheduled for on 5 days 12/10-12/14, Disp: , Rfl:    GERI-TUSSIN 100 MG/5ML liquid, Take 10 mLs by mouth every 6 (six) hours as needed for cough., Disp: , Rfl:    glipiZIDE  (GLUCOTROL ) 5 MG tablet, Take 1 tablet (5 mg total) by mouth 2 (two) times daily before a meal., Disp: 60 tablet, Rfl: 1   guaiFENesin  (GERI-TUSSIN) 100 MG/5ML liquid, Take 30 mLs by mouth every 6 (six) hours as needed for cough or to loosen phlegm., Disp: , Rfl:    haloperidol  decanoate (HALDOL  DECANOATE) 100 MG/ML injection, Inject 1 mL (100 mg total) into the muscle every 30 (thirty) days., Disp: 1 mL, Rfl: 0   ketoconazole  (NIZORAL ) 2 % cream, Apply 1 Application topically 2 (two) times daily. Apply to both feet then bandage., Disp: , Rfl:    lisinopril  (ZESTRIL ) 2.5 MG tablet, Take 2.5 mg by mouth daily., Disp: , Rfl:    loperamide  (IMODIUM ) 2 MG capsule, Take 1 capsule (2 mg total) by mouth as needed for diarrhea or  loose stools., Disp: , Rfl:    Menthol (HONEY LEMON COUGH DROPS MT), Use as directed 1 lozenge in the mouth or throat 4 (four) times daily as needed (cough)., Disp: , Rfl:    metoprolol  tartrate (LOPRESSOR ) 25 MG tablet, Take 0.5 tablets (12.5 mg total) by mouth 2 (two) times daily., Disp: , Rfl:    MUCUS RELIEF 600 MG 12 hr tablet, Take 600 mg by mouth 2 (two) times daily. Take for 7 days 12/6-12/12, Disp: , Rfl:    nitroGLYCERIN  (NITROSTAT ) 0.3 MG SL tablet, Place 0.3 mg under the tongue every 5 (five) minutes as needed for chest pain., Disp: , Rfl:    nystatin  (MYCOSTATIN /NYSTOP ) powder, Apply 1 Application topically 3 (three) times daily. Apply to groin and abdominal folds until healed, Disp: , Rfl:    omeprazole (PRILOSEC) 40 MG capsule, Take 40 mg by mouth daily., Disp: , Rfl:    ondansetron  (ZOFRAN ) 4 MG tablet, Take 4 mg by mouth every 8 (eight) hours as needed for nausea or vomiting., Disp: , Rfl:    oxybutynin  (DITROPAN -XL) 10 MG 24 hr tablet, Take 10 mg by mouth daily., Disp: , Rfl:    oxymetazoline (AFRIN) 0.05 % nasal spray, Place 1 spray into both nostrils 2 (two) times daily as needed for congestion. For nose bleed, Disp: , Rfl:    polyethylene glycol (MIRALAX / GLYCOLAX) 17 g packet, Take 17 g by mouth daily as needed for mild constipation., Disp: , Rfl:    potassium chloride  (K-DUR) 10 MEQ tablet, Take 10 mEq by mouth daily., Disp: , Rfl:    Pramox-PE-Glycerin-Petrolatum (HEMORRHOIDAL EX), Apply 1 Application topically every 6 (six) hours as needed. Apply a small amount to rectal area, Disp: , Rfl:    pregabalin  (LYRICA ) 100 MG capsule, Take 100 mg by mouth 3 (three) times daily., Disp: , Rfl:    psyllium (REGULOID) 0.52 g capsule, Take 0.52 g by mouth daily., Disp: , Rfl:    TRADJENTA  5 MG TABS tablet, Take 5  mg by mouth daily., Disp: , Rfl:    traMADol  (ULTRAM ) 50 MG tablet, Take 50 mg by mouth every 6 (six) hours as needed., Disp: , Rfl:    venlafaxine  XR (EFFEXOR -XR) 150 MG 24  hr capsule, Take 150 mg by mouth daily with breakfast. Take with 75mg  capsule for a total of 225mg , Disp: , Rfl:    venlafaxine  XR (EFFEXOR -XR) 75 MG 24 hr capsule, Take 75 mg by mouth daily with breakfast. Take with 150mg  capsule for a total of 225mg , Disp: , Rfl:    atorvastatin  (LIPITOR) 20 MG tablet, Take 20 mg by mouth at bedtime., Disp: , Rfl:    Calcium  Carbonate (CALCIUM  600 PO), Take by mouth daily. (Patient not taking: Reported on 01/26/2024), Disp: , Rfl:    CHEST CONGESTION RELIEF 400 MG TABS tablet, Take 400 mg by mouth every 6 (six) hours as needed (Congestion). (Patient not taking: Reported on 01/26/2024), Disp: , Rfl:    dicyclomine  (BENTYL ) 20 MG tablet, Take 20 mg by mouth 4 (four) times daily. (Patient not taking: Reported on 01/26/2024), Disp: , Rfl:    diphenoxylate -atropine  (LOMOTIL ) 2.5-0.025 MG tablet, Take 1 tablet by mouth daily as needed for diarrhea or loose stools. (Patient not taking: Reported on 01/26/2024), Disp: , Rfl:    famotidine  (PEPCID ) 20 MG tablet, Take 20 mg by mouth daily. (Patient not taking: Reported on 01/26/2024), Disp: , Rfl:    Glycerin-Hypromellose-PEG 400 (ARTIFICIAL TEARS) 0.2-0.2-1 % SOLN, Place 1 drop into both eyes 2 (two) times daily. (Patient not taking: Reported on 01/26/2024), Disp: , Rfl:    Insulin  Pen Needle 31G X 8 MM MISC, PEN NEEDLES 31G X 6 MM MISC, Disp: , Rfl:    lisinopril  (ZESTRIL ) 5 MG tablet, Take 5 mg by mouth daily. (Patient not taking: Reported on 01/26/2024), Disp: , Rfl:    REFRESH TEARS 0.5 % SOLN, Place 2 drops into both eyes 4 (four) times daily as needed (Dry eyes). (Patient not taking: Reported on 01/26/2024), Disp: , Rfl:    sitaGLIPtin (JANUVIA) 100 MG tablet, Take 100 mg by mouth daily. (Patient not taking: No sig reported), Disp: , Rfl:   Allergies: Allergies  Allergen Reactions   Bee Venom Anaphylaxis and Shortness Of Breath    Stated by Patient   Shellfish Allergy Anaphylaxis and Shortness Of Breath    Stated  by Patient Stated by Patient    Shellfish Protein-Containing Drug Products Anaphylaxis and Shortness Of Breath    Stated by Patient   Codeine    Thorazine [Chlorpromazine] Swelling   Tylenol  With Codeine #3 [Acetaminophen -Codeine] Other (See Comments)    Hallucinations   Metronidazole  Rash    Lakea Mittelman, NP

## 2024-01-26 NOTE — ED Notes (Signed)
 This tech collected urine from pt.

## 2024-01-26 NOTE — ED Notes (Signed)
 Pt given snack and beverage.

## 2024-01-26 NOTE — ED Notes (Signed)
 VOL/  PENDING  CONSULT

## 2024-01-26 NOTE — ED Notes (Signed)
 TTS at bedside.

## 2024-01-26 NOTE — Discharge Instructions (Signed)
 You were seen in the emergency department for suicidal ideation.  You were evaluated by psychiatry and cleared for discharge.  -- At time of discharge there is no evidence of acute life, limb, vision, or fertility threat. Patient has stable vital signs, pain is well controlled, patient is ambulatory and p.o. tolerant.  Discharge instructions were completed using the EPIC system. I would refer you to those at this time. All warnings prescriptions follow-up etc. were discussed in detail with the patient. Patient indicates understanding and is agreeable with this plan. All questions answered.  Patient is made aware that they may return to the emergency department for any worsening or new condition or for any other emergency.

## 2024-01-27 DIAGNOSIS — F332 Major depressive disorder, recurrent severe without psychotic features: Secondary | ICD-10-CM | POA: Diagnosis not present

## 2024-01-27 LAB — CBG MONITORING, ED: Glucose-Capillary: 177 mg/dL — ABNORMAL HIGH (ref 70–99)

## 2024-01-27 NOTE — ED Notes (Signed)
 Called [facility] spoke with Jennifer/ Pt 2nd on list- arrival time(3am-4am)

## 2024-02-24 ENCOUNTER — Encounter: Payer: Self-pay | Admitting: Hematology and Oncology
